# Patient Record
Sex: Female | Born: 1945
Health system: Southern US, Community
[De-identification: ages and names within clinical notes are randomized; demographics above are authoritative.]

## PROBLEM LIST (undated history)

## (undated) DIAGNOSIS — R300 Dysuria: Principal | ICD-10-CM

## (undated) DIAGNOSIS — Z809 Family history of malignant neoplasm, unspecified: Principal | ICD-10-CM

## (undated) DIAGNOSIS — N958 Other specified menopausal and perimenopausal disorders: Secondary | ICD-10-CM

## (undated) DIAGNOSIS — F419 Anxiety disorder, unspecified: Secondary | ICD-10-CM

## (undated) DIAGNOSIS — K219 Gastro-esophageal reflux disease without esophagitis: Secondary | ICD-10-CM

## (undated) DIAGNOSIS — R7989 Other specified abnormal findings of blood chemistry: Principal | ICD-10-CM

## (undated) DIAGNOSIS — Z1231 Encounter for screening mammogram for malignant neoplasm of breast: Secondary | ICD-10-CM

## (undated) DIAGNOSIS — N281 Cyst of kidney, acquired: Principal | ICD-10-CM

## (undated) DIAGNOSIS — J3800 Paralysis of vocal cords and larynx, unspecified: Secondary | ICD-10-CM

## (undated) DIAGNOSIS — Z800 Family history of malignant neoplasm of digestive organs: Principal | ICD-10-CM

## (undated) DIAGNOSIS — N898 Other specified noninflammatory disorders of vagina: Secondary | ICD-10-CM

## (undated) DIAGNOSIS — I2584 Coronary atherosclerosis due to calcified coronary lesion: Secondary | ICD-10-CM

## (undated) DIAGNOSIS — I2510 Atherosclerotic heart disease of native coronary artery without angina pectoris: Secondary | ICD-10-CM

## (undated) DIAGNOSIS — K760 Fatty (change of) liver, not elsewhere classified: Principal | ICD-10-CM

## (undated) DIAGNOSIS — M4807 Spinal stenosis, lumbosacral region: Secondary | ICD-10-CM

## (undated) DIAGNOSIS — R928 Other abnormal and inconclusive findings on diagnostic imaging of breast: Secondary | ICD-10-CM

## (undated) DIAGNOSIS — E782 Mixed hyperlipidemia: Secondary | ICD-10-CM

## (undated) DIAGNOSIS — Z8719 Personal history of other diseases of the digestive system: Secondary | ICD-10-CM

## (undated) DIAGNOSIS — F32A Depression, unspecified: Secondary | ICD-10-CM

## (undated) DIAGNOSIS — M8580 Other specified disorders of bone density and structure, unspecified site: Secondary | ICD-10-CM

## (undated) DIAGNOSIS — Z85828 Personal history of other malignant neoplasm of skin: Secondary | ICD-10-CM

## (undated) DIAGNOSIS — N1832 Chronic kidney disease, stage 3b: Secondary | ICD-10-CM

## (undated) DIAGNOSIS — K5790 Diverticulosis of intestine, part unspecified, without perforation or abscess without bleeding: Secondary | ICD-10-CM

## (undated) DIAGNOSIS — M1990 Unspecified osteoarthritis, unspecified site: Secondary | ICD-10-CM

## (undated) DIAGNOSIS — I10 Essential (primary) hypertension: Secondary | ICD-10-CM

## (undated) DIAGNOSIS — C801 Malignant (primary) neoplasm, unspecified: Secondary | ICD-10-CM

## (undated) DIAGNOSIS — F329 Major depressive disorder, single episode, unspecified: Secondary | ICD-10-CM

## (undated) DIAGNOSIS — E785 Hyperlipidemia, unspecified: Secondary | ICD-10-CM

## (undated) DIAGNOSIS — Z87442 Personal history of urinary calculi: Secondary | ICD-10-CM

## (undated) DIAGNOSIS — N189 Chronic kidney disease, unspecified: Secondary | ICD-10-CM

## (undated) DIAGNOSIS — N3281 Overactive bladder: Secondary | ICD-10-CM

## (undated) DIAGNOSIS — T7840XA Allergy, unspecified, initial encounter: Secondary | ICD-10-CM

## (undated) HISTORY — DX: Allergy, unspecified, initial encounter: T78.40XA

## (undated) HISTORY — DX: Chronic kidney disease, stage 3b: N18.32

## (undated) HISTORY — DX: Major depressive disorder, single episode, unspecified: F32.9

## (undated) HISTORY — DX: Chronic kidney disease, unspecified: N18.9

## (undated) HISTORY — DX: Depression, unspecified: F32.A

## (undated) HISTORY — PX: TUBAL LIGATION: SHX77

## (undated) HISTORY — DX: Other specified disorders of bone density and structure, unspecified site: M85.80

## (undated) HISTORY — DX: Gastro-esophageal reflux disease without esophagitis: K21.9

## (undated) HISTORY — DX: Essential (primary) hypertension: I10

## (undated) HISTORY — PX: TONSILLECTOMY: SUR1361

## (undated) HISTORY — DX: Hyperlipidemia, unspecified: E78.5

## (undated) HISTORY — DX: Overactive bladder: N32.81

---

## 1970-03-20 HISTORY — PX: BREAST BIOPSY: SHX20

## 1992-03-20 HISTORY — PX: ABDOMINAL HYSTERECTOMY: SHX81

## 1998-01-01 ENCOUNTER — Other Ambulatory Visit: Admission: RE | Admit: 1998-01-01 | Discharge: 1998-01-01 | Payer: Self-pay | Admitting: Obstetrics and Gynecology

## 1999-01-25 ENCOUNTER — Other Ambulatory Visit: Admission: RE | Admit: 1999-01-25 | Discharge: 1999-01-25 | Payer: Self-pay | Admitting: Obstetrics and Gynecology

## 2000-03-30 ENCOUNTER — Other Ambulatory Visit: Admission: RE | Admit: 2000-03-30 | Discharge: 2000-03-30 | Payer: Self-pay | Admitting: Obstetrics and Gynecology

## 2000-09-28 ENCOUNTER — Ambulatory Visit (HOSPITAL_COMMUNITY): Admission: RE | Admit: 2000-09-28 | Discharge: 2000-09-28 | Payer: Self-pay | Admitting: Emergency Medicine

## 2000-09-28 ENCOUNTER — Encounter: Payer: Self-pay | Admitting: Emergency Medicine

## 2001-01-22 ENCOUNTER — Encounter: Admission: RE | Admit: 2001-01-22 | Discharge: 2001-01-22 | Payer: Self-pay | Admitting: Emergency Medicine

## 2001-01-22 ENCOUNTER — Encounter: Payer: Self-pay | Admitting: Emergency Medicine

## 2001-04-08 ENCOUNTER — Other Ambulatory Visit: Admission: RE | Admit: 2001-04-08 | Discharge: 2001-04-08 | Payer: Self-pay | Admitting: Obstetrics and Gynecology

## 2002-05-27 ENCOUNTER — Encounter: Payer: Self-pay | Admitting: Emergency Medicine

## 2002-05-27 ENCOUNTER — Encounter: Admission: RE | Admit: 2002-05-27 | Discharge: 2002-05-27 | Payer: Self-pay | Admitting: Emergency Medicine

## 2002-11-10 ENCOUNTER — Other Ambulatory Visit: Admission: RE | Admit: 2002-11-10 | Discharge: 2002-11-10 | Payer: Self-pay | Admitting: Obstetrics and Gynecology

## 2003-02-19 ENCOUNTER — Encounter: Admission: RE | Admit: 2003-02-19 | Discharge: 2003-02-19 | Payer: Self-pay | Admitting: Emergency Medicine

## 2004-02-15 ENCOUNTER — Encounter: Admission: RE | Admit: 2004-02-15 | Discharge: 2004-02-15 | Payer: Self-pay | Admitting: Emergency Medicine

## 2004-02-16 ENCOUNTER — Other Ambulatory Visit: Admission: RE | Admit: 2004-02-16 | Discharge: 2004-02-16 | Payer: Self-pay | Admitting: Obstetrics and Gynecology

## 2004-02-22 ENCOUNTER — Encounter: Admission: RE | Admit: 2004-02-22 | Discharge: 2004-02-22 | Payer: Self-pay | Admitting: Emergency Medicine

## 2005-02-22 ENCOUNTER — Other Ambulatory Visit: Admission: RE | Admit: 2005-02-22 | Discharge: 2005-02-22 | Payer: Self-pay | Admitting: Obstetrics and Gynecology

## 2005-07-12 ENCOUNTER — Encounter: Admission: RE | Admit: 2005-07-12 | Discharge: 2005-07-12 | Payer: Self-pay | Admitting: Gynecology

## 2006-08-10 ENCOUNTER — Encounter: Admission: RE | Admit: 2006-08-10 | Discharge: 2006-08-10 | Payer: Self-pay | Admitting: Obstetrics and Gynecology

## 2006-08-29 ENCOUNTER — Other Ambulatory Visit: Admission: RE | Admit: 2006-08-29 | Discharge: 2006-08-29 | Payer: Self-pay | Admitting: Obstetrics and Gynecology

## 2007-08-30 ENCOUNTER — Other Ambulatory Visit: Admission: RE | Admit: 2007-08-30 | Discharge: 2007-08-30 | Payer: Self-pay | Admitting: Obstetrics and Gynecology

## 2008-02-12 ENCOUNTER — Encounter: Payer: Self-pay | Admitting: Family Medicine

## 2008-07-15 ENCOUNTER — Encounter: Payer: Self-pay | Admitting: Family Medicine

## 2008-09-15 ENCOUNTER — Encounter: Payer: Self-pay | Admitting: Obstetrics and Gynecology

## 2008-09-15 ENCOUNTER — Ambulatory Visit: Payer: Self-pay | Admitting: Obstetrics and Gynecology

## 2008-09-15 ENCOUNTER — Other Ambulatory Visit: Admission: RE | Admit: 2008-09-15 | Discharge: 2008-09-15 | Payer: Self-pay | Admitting: Obstetrics and Gynecology

## 2009-04-12 ENCOUNTER — Ambulatory Visit: Payer: Self-pay | Admitting: Family Medicine

## 2009-04-12 DIAGNOSIS — I10 Essential (primary) hypertension: Secondary | ICD-10-CM | POA: Insufficient documentation

## 2009-04-12 DIAGNOSIS — F3289 Other specified depressive episodes: Secondary | ICD-10-CM | POA: Insufficient documentation

## 2009-04-12 DIAGNOSIS — F329 Major depressive disorder, single episode, unspecified: Secondary | ICD-10-CM

## 2009-04-12 DIAGNOSIS — J309 Allergic rhinitis, unspecified: Secondary | ICD-10-CM | POA: Insufficient documentation

## 2009-04-12 DIAGNOSIS — J45909 Unspecified asthma, uncomplicated: Secondary | ICD-10-CM | POA: Insufficient documentation

## 2009-04-12 DIAGNOSIS — K219 Gastro-esophageal reflux disease without esophagitis: Secondary | ICD-10-CM | POA: Insufficient documentation

## 2009-04-12 DIAGNOSIS — E785 Hyperlipidemia, unspecified: Secondary | ICD-10-CM | POA: Insufficient documentation

## 2009-04-12 DIAGNOSIS — R32 Unspecified urinary incontinence: Secondary | ICD-10-CM | POA: Insufficient documentation

## 2009-04-28 ENCOUNTER — Ambulatory Visit: Payer: Self-pay | Admitting: Family Medicine

## 2009-05-01 ENCOUNTER — Encounter: Payer: Self-pay | Admitting: Family Medicine

## 2009-05-03 LAB — CONVERTED CEMR LAB
ALT: 27 units/L (ref 0–35)
AST: 26 units/L (ref 0–37)
Alkaline Phosphatase: 75 units/L (ref 39–117)
BUN: 14 mg/dL (ref 6–23)
Basophils Absolute: 0 10*3/uL (ref 0.0–0.1)
Bilirubin, Direct: 0 mg/dL (ref 0.0–0.3)
CO2: 31 meq/L (ref 19–32)
Eosinophils Relative: 1.8 % (ref 0.0–5.0)
Lymphs Abs: 0.9 10*3/uL (ref 0.7–4.0)
MCHC: 33.4 g/dL (ref 30.0–36.0)
Monocytes Absolute: 0.4 10*3/uL (ref 0.1–1.0)
Neutro Abs: 3.3 10*3/uL (ref 1.4–7.7)
Platelets: 148 10*3/uL — ABNORMAL LOW (ref 150.0–400.0)
Potassium: 4.5 meq/L (ref 3.5–5.1)
RDW: 12.9 % (ref 11.5–14.6)
TSH: 1.55 microintl units/mL (ref 0.35–5.50)
Total Bilirubin: 0.3 mg/dL (ref 0.3–1.2)
Total CHOL/HDL Ratio: 4
Triglycerides: 262 mg/dL — ABNORMAL HIGH (ref 0.0–149.0)
Vit D, 25-Hydroxy: 30 ng/mL (ref 30–89)
WBC: 4.7 10*3/uL (ref 4.5–10.5)

## 2009-05-05 ENCOUNTER — Emergency Department (HOSPITAL_COMMUNITY): Admission: EM | Admit: 2009-05-05 | Discharge: 2009-05-06 | Payer: Self-pay | Admitting: Emergency Medicine

## 2009-05-06 ENCOUNTER — Telehealth: Payer: Self-pay | Admitting: Family Medicine

## 2009-05-21 ENCOUNTER — Telehealth: Payer: Self-pay | Admitting: Family Medicine

## 2009-05-22 ENCOUNTER — Ambulatory Visit: Payer: Self-pay | Admitting: Family Medicine

## 2009-05-22 LAB — CONVERTED CEMR LAB
Glucose, Urine, Semiquant: NEGATIVE
Protein, U semiquant: NEGATIVE
Specific Gravity, Urine: 1.01
WBC Urine, dipstick: NEGATIVE

## 2009-06-14 ENCOUNTER — Encounter: Payer: Self-pay | Admitting: Family Medicine

## 2009-07-19 ENCOUNTER — Encounter: Payer: Self-pay | Admitting: Family Medicine

## 2009-09-10 ENCOUNTER — Telehealth: Payer: Self-pay | Admitting: Family Medicine

## 2009-09-13 ENCOUNTER — Ambulatory Visit: Payer: Self-pay | Admitting: Family Medicine

## 2009-09-14 ENCOUNTER — Encounter: Payer: Self-pay | Admitting: Family Medicine

## 2009-09-14 LAB — CONVERTED CEMR LAB: Triglycerides: 112 mg/dL (ref 0.0–149.0)

## 2010-03-25 ENCOUNTER — Ambulatory Visit
Admission: RE | Admit: 2010-03-25 | Discharge: 2010-03-25 | Payer: Self-pay | Source: Home / Self Care | Attending: Obstetrics and Gynecology | Admitting: Obstetrics and Gynecology

## 2010-03-25 ENCOUNTER — Other Ambulatory Visit
Admission: RE | Admit: 2010-03-25 | Discharge: 2010-03-25 | Payer: Self-pay | Source: Home / Self Care | Admitting: Obstetrics and Gynecology

## 2010-04-13 ENCOUNTER — Encounter
Admission: RE | Admit: 2010-04-13 | Discharge: 2010-04-13 | Payer: Self-pay | Source: Home / Self Care | Attending: Gastroenterology | Admitting: Gastroenterology

## 2010-04-19 NOTE — Letter (Signed)
Summary: Records from [facility] 2006 - [address]  Records from [facility] 2006 - [address]   Imported By: Maryln Gottron 04/23/2009 15:45:06  _____________________________________________________________________  External Attachment:    Type:   Image     Comment:   External Document

## 2010-04-19 NOTE — Procedures (Signed)
Summary: Colonoscopy Report/Dr. Sharrell Ku  Colonoscopy Report/Dr. Sharrell Ku   Imported By: Maryln Gottron 07/07/2009 15:44:54  _____________________________________________________________________  External Attachment:    Type:   Image     Comment:   External Document

## 2010-04-19 NOTE — Assessment & Plan Note (Signed)
Summary: ?UTI/NJR   Vital Signs:  Patient profile:   65 year old female Weight:      166 pounds Temp:     98.3 degrees F oral Pulse rate:   64 / minute Pulse rhythm:   regular BP sitting:   118 / 80  (left arm) Cuff size:   regular  Vitals Entered By: Lowella Petties CMA (May 22, 2009 9:40 AM) CC: Pt states she has had fever, pressure when voiding.   History of Present Illness: 65 year old with UTI symptoms:  Has had some reent bladder infections, had some symptoms in december. Had some blood in her urine then and was given three days of ABX then.   Symptoms came back about a month then. Dr. Clent Ridges saw her and had a urine sample and was cultured - and had some infection.   Macrobid - made sick on her stomach  Got a temp to 103. Went to the ER and got a virus on top of the infection per her report   Had some fever blisters.   Now, feels some urgency and dysuria. Thursday night started to feel pretty bad - got a temp of 102.8. No back pain   Allergies: 1)  ! Codeine 2)  ! Ace Inhibitors  Past History:  Past medical, surgical, family and social histories (including risk factors) reviewed, and no changes noted (except as noted below).  Past Medical History: Reviewed history from 04/12/2009 and no changes required. Allergic rhinitis Asthma Depression GERD Hyperlipidemia Hypertension Urinary incontinence normal stress test July 2010 vitamin B12 deficiency vitamin D deficiency  Past Surgical History: Reviewed history from 04/12/2009 and no changes required. Breast biopsy, benign 1972 TAH with intact ovaries sees Dr. Danne Baxter for GYN exams Tubal ligation Tonsillectomy colonoscopy 2005 per Dr. Kinnie Scales, clear, repeat in  10 yrs  Family History: Reviewed history from 04/12/2009 and no changes required. Family History of Alcoholism/Addiction Family History of Arthritis Family History Breast cancer 1st degree relative <50 Family History of CAD Female 1st degree  relative <50 Family History Depression Family History Diabetes 1st degree relative Family History High cholesterol Family History Hypertension Stenosis of spine - Mom  Social History: Reviewed history from 04/12/2009 and no changes required. 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 Alcohol use-yes Drug use-no Regular exercise-yes  Review of Systems       The patient complains of anorexia and fever.  The patient denies chest pain, syncope, prolonged cough, and headaches.         suprapubic pain  Physical Exam  General:  Well-developed,well-nourished,in no acute distress; alert,appropriate and cooperative throughout examination Head:  Normocephalic and atraumatic without obvious abnormalities. No apparent alopecia or balding. Ears:  External ear exam shows no significant lesions or deformities.  Otoscopic examination reveals clear canals, tympanic membranes are intact bilaterally without bulging, retraction, inflammation or discharge. Hearing is grossly normal bilaterally. Nose:  no external deformity.   Mouth:  Oral mucosa and oropharynx without lesions or exudates.  Teeth in good repair. Neck:  No deformities, masses, or tenderness noted. Lungs:  Normal respiratory effort, chest expands symmetrically. Lungs are clear to auscultation, no crackles or wheezes. Heart:  Normal rate and regular rhythm. S1 and S2 normal without gallop, murmur, click, rub or other extra sounds. Abdomen:  + suprapubic tendernesssoft, normal bowel sounds, no distention, no masses, no guarding, no rigidity, and no rebound tenderness.    no cvat Extremities:  No clubbing, cyanosis, edema, or deformity  noted with normal full range of motion of all joints.   Neurologic:  alert & oriented X3 and gait normal.   Cervical Nodes:  No lymphadenopathy noted Psych:  Cognition and judgment appear intact. Alert and cooperative with normal attention span and concentration. No apparent delusions,  illusions, hallucinations   Impression & Recommendations:  Problem # 1:  PYELONEPHRITIS, ACUTE (ICD-590.10) Assessment New  Temp approaching 103 with UTI symptoms, + UA (blood only), dysuria, suprapubic pain  Probable early pyelo and treat with longer course of ABX  stable for outpatient management  Orders: UA Dipstick w/o Micro (manual) (46270)  Complete Medication List: 1)  Nexium 40 Mg Cpdr (Esomeprazole magnesium) .... Take 1 tab each morning 2)  Detrol La 4 Mg Cp24 (Tolterodine tartrate) .[name].. 1 tablet by mouth daily 3)  Sertraline Hcl 100 Mg Tabs (Sertraline hcl) .[name].. 1 by mouth daily 4)  Budeprion Xl 300 Mg Tb24 (Bupropion hcl) .[name].. 1 by mouth daily 5)  Vitamin D (ergocalciferol) 50000 Unit Caps (Ergocalciferol) .... Once weekly 6)  Simvastatin 80 Mg Tabs (Simvastatin) .[name].. 1 by mouth at bedtime 7)  Aspirin 81 Mg Tbec (Aspirin) .... One by mouth every day 8)  Metoprolol Succinate 50 Mg Xr24h-tab (Metoprolol succinate) .... Once daily 9)  Fenofibrate 160 Mg Tabs (Fenofibrate) .[name].. 1 by mouth once daily 10)  Ciprofloxacin Hcl 250 Mg Tabs (Ciprofloxacin hcl) .[name].. 1 by mouth two times a day Prescriptions: CIPROFLOXACIN HCL 250 MG TABS (CIPROFLOXACIN HCL) 1 by mouth two times a day  #28 x 0   Entered and Authorized by:   Hannah Beat MD   Signed by:   Hannah Beat MD on 05/22/2009   Method used:   Electronically to        [id number] (retail)       [address]       [place], [place]  [address]       Ph: [phone] or [phone]       Fax: [phone]   RxID:   [id number]   Laboratory Results   Urine Tests  Date/Time Received: May 22, 2009 9:46 AM  Date/Time Reported: May 22, 2009 9:46 AM   Routine Urinalysis   Color: yellow Appearance: Clear Glucose: negative   (Normal Range: Negative) Bilirubin: negative   (Normal Range: Negative) Ketone: negative   (Normal Range: Negative) Spec. Gravity: 1.010   (Normal Range: 1.003-1.035) Blood:  small   (Normal Range: Negative) pH: 5.0   (Normal Range: 5.0-8.0) Protein: negative   (Normal Range: Negative) Urobilinogen: 0.2   (Normal Range: 0-1) Nitrite: negative   (Normal Range: Negative) Leukocyte Esterace: negative   (Normal Range: Negative)

## 2010-04-19 NOTE — Progress Notes (Signed)
Summary: fever, uti  Phone Note Call from Patient Call back at Home Phone [phone]   Caller: Patient Call For: fry Summary of Call: pt called at 4:30 saying she had a fever last night of 102.8 and now it is 100.4. She was treated for a UTI last month.  Gave the pt 2 choices, go to the Urgent Care to recheck a urine or [facility] could [facility].  [facility].  Scheduled appt fo r 9:20.  Told pt to drink plenty of fluids, take advil or tylelnol for the fever, if fever persist or she has n/v to go to ER.  Pt agreed Initial call taken by: Alfred Levins, CMA,  May 21, 2009 4:53 PM  Follow-up for Phone Call        agreed Follow-up by: Nelwyn Salisbury MD,  May 21, 2009 5:02 PM

## 2010-04-19 NOTE — Progress Notes (Signed)
Summary: Call A Nurse Report  Phone Note Call from Patient   Summary of Call: pt was seen in the ED last night Initial call taken by: Alfred Levins, CMA,  May 06, 2009 8:18 AM  Follow-up for Phone Call       Follow-up by: Lynann Beaver CMA,  May 06, 2009 8:14 AM    Additional Follow-up for Phone Call Additional follow up Details #2::    After reviewing On Call Nurse note from last night, left message on pt's personal voice mail to call for appt, or to update [place] on pt's condition this a. Follow-up by: Lynann Beaver CMA,  May 06, 2009 8:16 AM    Call-A-Nurse Triage Call Report Triage Record Num: [id number] Operator: Patriciaann Clan Patient Name: Maria Schneider Call Date & Time: 05/05/2009 5:20:07PM Patient Phone: [phone] PCP: Tera Mater. Clent Ridges Patient Gender: Female PCP Fax : [phone] Patient DOB: [birth year] [facility] Name: [facility] Reason for Call: Spouse/Thomas calling. States patient was seen in office 04/29/09 and dx withUTI. States pateint started on Nitrofurantin for UTI. States patient developed fever 05/05/09 0400. Temp.103.4 orally @ 1645 05/05/09.Patient had Ibuprofen 400mg . @ 1645 05/05/09. States patient c/o nausea. Emesis X 1. Patient states only urinated X 1 05/05/09 @ 1500. States urine dark in color. Denies hematuria. Advised increased fluids. Spouse advised to have patient evaluated in ED @ [facility]. Spouse agreeable. States will transport now. Protocol(s) Used: Urinary Symptoms - Female Recommended Outcome per Protocol: See Provider within 24 hours Override Outcome if Used in Protocol: See ED Immediately RN Reason for Override Outcome: Nursing Judgement Used. Reason for Outcome: Urinary tract symptoms Care Advice:  ~ Call provider if urine becomes pink, red, or smoky in color.  ~ Tell provider medical history of renal disease; especially if have only one kidney.  ~ Call provider if you develop flank or low back pain, fever,  generally feel sick. Increase intake of fluids. Try to drink 8 oz. (.2 liter) every hour when awake, including unsweetened cranberry juice, unless on restricted fluids for other medical reasons. Take sips of fluid or eat ice chips if nauseated or vomiting.  ~ Limit carbonated, alcoholic, and caffeinated beverages such as coffee, tea and soda. Avoid OTC cold and allergy medications that contain caffeine. Limit intake of tomatoes, fruit juices (except for unsweetened cranberry juice), dairy products, spicy foods, sugar, and artificial sweeteners (aspartame or saccharine). Stop or decrease smoking. Reducing exposure to bladder irritants may help lessen urgency.  ~ Systemic Inflammatory Response Syndrome (SIRS): Watch for signs of a generalized, whole body infection. Occurs within days of a localized infection, especially of the urinary, GI, respiratory or nervous systems; or after a traumatic injury or invasive procedure. - Call EMS 911 if symptoms have worsened, such as increasing confusion or unusual drowsiness; cold and clammy skin; no urine output; rapid respiration (>30/min.) or slow respiration (<10/min.); struggling to breathe. - Go to the ED immediately for early symptoms of rapid pulse >90/min. or rapid breathing >20/min. at rest; chills; oral temperature >100.4 F (38 C) or <96.8 F (36 C) when associated with conditions noted.  ~ Analgesic Advice: Consider aspirin, ibuprofen, naproxen or ketoprofen for pain or fever as directed on label or by pharmacist/provider. PRECAUTION: - If over 63 years of age, should not take longer than 1 week without consulting provider. EXCEPTIONS: - Should not be used if taking blood thinners. - Or if have history of sensitivity/allergy to any of these medications; or history of  ulcer or kidney disease.  ~  ~ Tell your provider if you are taking Warfarin (Coumadin) and drinking cranberry juice or taking cranberry capsules.  ~ SYMPTOM / CONDITION  MANAGEMENT 05/05/2009 5:35:12PM Page 1 of 1 [id number]  Appended Document: Call A Nurse Report Spoke to pt's husband, and they DID go to the ER last night, and she was given IV's and sent home.  Doing well right now.  Diagnoses with a virus, not flu, and not UTI.

## 2010-04-19 NOTE — Assessment & Plan Note (Signed)
Summary: 2 wk rov/mm   Vital Signs:  Patient profile:   65 year old female Weight:      167 pounds O2 Sat:      97 % Temp:     98.1 degrees F oral Pulse rate:   72 / minute BP sitting:   110 / 80  Vitals Entered By: Lynann Beaver CMA (April 28, 2009 8:32 AM) CC: BP check and fasting labs Is Patient Diabetic? No Pain Assessment Patient in pain? no        History of Present Illness: Here to follow up HTN after switching to metoprolol. She tolerates this well, and her BP is excellent. She is fasting for labs. Also she asks about some mild intermittent anterior chest discomfort that has bothered her for about 5 years. No SOB, no heartburn. No relationship to exercise.   Current Medications (verified): 1)  Nexium 40 Mg Cpdr (Esomeprazole Magnesium) .... Take 1 Tab Each Morning 2)  Detrol La 4 Mg Cp24 (Tolterodine Tartrate) .[name].. 1 Tablet By Mouth Daily 3)  Sertraline Hcl 100 Mg Tabs (Sertraline Hcl) .[name].. 1 By Mouth Daily 4)  Budeprion Xl 300 Mg Tb24 (Bupropion Hcl) .[name].. 1 By Mouth Daily 5)  Vitamin D (Ergocalciferol) 50000 Unit Caps (Ergocalciferol) .... Once Weekly 6)  Simvastatin 40 Mg Tabs (Simvastatin) .... Take One Tablet At Bedtime 7)  Aspirin 81 Mg  Tbec (Aspirin) .... One By Mouth Every Day 8)  Metoprolol Succinate 50 Mg Xr24h-Tab (Metoprolol Succinate) .... Once Daily  Allergies (verified): 1)  ! Codeine 2)  ! Ace Inhibitors  Past History:  Past Medical History: Reviewed history from 04/12/2009 and no changes required. Allergic rhinitis Asthma Depression GERD Hyperlipidemia Hypertension Urinary incontinence normal stress test July 2010 vitamin B12 deficiency vitamin D deficiency  Review of Systems  The patient denies anorexia, fever, weight loss, weight gain, vision loss, decreased hearing, hoarseness, syncope, dyspnea on exertion, peripheral edema, prolonged cough, headaches, hemoptysis, abdominal pain, melena, hematochezia, severe indigestion/heartburn,  hematuria, incontinence, genital sores, muscle weakness, suspicious skin lesions, transient blindness, difficulty walking, depression, unusual weight change, abnormal bleeding, enlarged lymph nodes, angioedema, breast masses, and testicular masses.    Physical Exam  General:  Well-developed,well-nourished,in no acute distress; alert,appropriate and cooperative throughout examination Neck:  No deformities, masses, or tenderness noted. Chest Wall:  mildly tender along the sternum Lungs:  Normal respiratory effort, chest expands symmetrically. Lungs are clear to auscultation, no crackles or wheezes. Heart:  Normal rate and regular rhythm. S1 and S2 normal without gallop, murmur, click, rub or other extra sounds.   Impression & Recommendations:  Problem # 1:  HYPERTENSION (ICD-401.9)  Her updated medication list for this problem includes:    Metoprolol Succinate 50 Mg Xr24h-tab (Metoprolol succinate) ..... Once daily  Orders: UA Dipstick w/o Micro (automated)  (81003) Venipuncture ([address]) TLB-Lipid Panel (80061-LIPID) TLB-BMP (Basic Metabolic Panel-BMET) (80048-METABOL) TLB-CBC Platelet - w/Differential (85025-CBCD) TLB-Hepatic/Liver Function Pnl (80076-HEPATIC) TLB-TSH (Thyroid Stimulating Hormone) (84443-TSH) TLB-B12, Serum-Total ONLY (60454-U98) T-Vitamin D (25-Hydroxy) (11914-78295)  Problem # 2:  HYPERLIPIDEMIA (ICD-272.4)  Her updated medication list for this problem includes:    Simvastatin 40 Mg Tabs (Simvastatin) .[name]... Take one tablet at bedtime  Problem # 3:  COSTOCHONDRITIS (ICD-733.6)  Her updated medication list for this problem includes:    Vitamin D (ergocalciferol) 50000 Unit Caps (Ergocalciferol) ..... Once weekly  Complete Medication List: 1)  Nexium 40 Mg Cpdr (Esomeprazole magnesium) .... Take 1 tab each morning 2)  Detrol La 4 Mg Cp24 (Tolterodine  tartrate) .[name].. 1 tablet by mouth daily 3)  Sertraline Hcl 100 Mg Tabs (Sertraline hcl) .[name].. 1 by mouth daily 4)   Budeprion Xl 300 Mg Tb24 (Bupropion hcl) .[name].. 1 by mouth daily 5)  Vitamin D (ergocalciferol) 50000 Unit Caps (Ergocalciferol) .... Once weekly 6)  Simvastatin 40 Mg Tabs (Simvastatin) .... Take one tablet at bedtime 7)  Aspirin 81 Mg Tbec (Aspirin) .... One by mouth every day 8)  Metoprolol Succinate 50 Mg Xr24h-tab (Metoprolol succinate) .... Once daily  Patient Instructions: 1)  Get labs. Reassured that the chest wall discomfort is benign. She says she will try wearing more supportive bras.   Appended Document: 2 wk rov/mm  Laboratory Results   Urine Tests    Routine Urinalysis   Color: yellow Appearance: Clear Glucose: negative   (Normal Range: Negative) Bilirubin: negative   (Normal Range: Negative) Ketone: negative   (Normal Range: Negative) Spec. Gravity: 1.020   (Normal Range: 1.003-1.035) Blood: 1+   (Normal Range: Negative) pH: 5.5   (Normal Range: 5.0-8.0) Protein: negative   (Normal Range: Negative) Urobilinogen: 0.2   (Normal Range: 0-1) Nitrite: negative   (Normal Range: Negative) Leukocyte Esterace: trace   (Normal Range: Negative)    Comments: Rita Ohara  April 28, 2009 11:46 AM

## 2010-04-19 NOTE — Assessment & Plan Note (Signed)
Summary: BRAND NEW PT/TO ESTABLISH/CJR   Vital Signs:  Patient profile:   65 year old female Height:      64.25 inches Weight:      168 pounds BMI:     28.72 Temp:     97.9 degrees F oral Pulse rate:   90 / minute BP sitting:   142 / 88  (left arm) Cuff size:   regular  Vitals Entered By: Alfred Levins, CMA (April 12, 2009 1:32 PM) CC: establish   History of Present Illness: 65 yr old female to establish with [place] and with some questions. She had been seeing Dr. Leslee Home until now. She has been treated for HTN with Losartan for about a year, but her BP is often a bit too high and this worries her. She would like some labwork as well to check Vitamins b12 and D, as these have been low in the past, and she is past due to check her cholesterol. She is not fasting today however. She feels fine in general.   Preventive Screening-Counseling & Management  Alcohol-Tobacco     Smoking Status: quit  Caffeine-Diet-Exercise     Does Patient Exercise: yes      Drug Use:  no.    Current Medications (verified): 1)  Nexium 40 Mg Cpdr (Esomeprazole Magnesium) .... Take 1 Tab Each Morning 2)  Detrol La 4 Mg Cp24 (Tolterodine Tartrate) .[name].. 1 Tablet By Mouth Daily 3)  Sertraline Hcl 100 Mg Tabs (Sertraline Hcl) .[name].. 1 By Mouth Daily 4)  Budeprion Xl 300 Mg Tb24 (Bupropion Hcl) .[name].. 1 By Mouth Daily 5)  Vitamin D (Ergocalciferol) 50000 Unit Caps (Ergocalciferol) .... Once Weekly 6)  Losartan Potassium 100 Mg Tabs (Losartan Potassium) .... Once Daily 7)  Simvastatin 40 Mg Tabs (Simvastatin) .... Take One Tablet At Bedtime 8)  Aspirin 81 Mg  Tbec (Aspirin) .... One By Mouth Every Day  Allergies (verified): 1)  ! Codeine 2)  ! Ace Inhibitors  Past History:  Past Medical History: Allergic rhinitis Asthma Depression GERD Hyperlipidemia Hypertension Urinary incontinence normal stress test July 2010 vitamin B12 deficiency vitamin D deficiency  Past Surgical History: Breast biopsy,  benign 1972 TAH with intact ovaries sees Dr. Danne Baxter for GYN exams Tubal ligation Tonsillectomy colonoscopy 2005 per Dr. Kinnie Scales, clear, repeat in  10 yrs  Family History: Reviewed history and no changes required. Family History of Alcoholism/Addiction Family History of Arthritis Family History Breast cancer 1st degree relative <50 Family History of CAD Female 1st degree relative <50 Family History Depression Family History Diabetes 1st degree relative Family History High cholesterol Family History Hypertension Stenosis of spine - Mom  Social History: Reviewed history and no changes required. 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 Alcohol use-yes Drug use-no Regular exercise-yes Smoking Status:  quit Drug Use:  no Does Patient Exercise:  yes  Review of Systems  The patient denies anorexia, fever, weight loss, weight gain, vision loss, decreased hearing, hoarseness, chest pain, syncope, dyspnea on exertion, peripheral edema, prolonged cough, headaches, hemoptysis, abdominal pain, melena, hematochezia, severe indigestion/heartburn, hematuria, incontinence, genital sores, muscle weakness, suspicious skin lesions, transient blindness, difficulty walking, depression, unusual weight change, abnormal bleeding, enlarged lymph nodes, angioedema, breast masses, and testicular masses.    Physical Exam  General:  Well-developed,well-nourished,in no acute distress; alert,appropriate and cooperative throughout examination Neck:  No deformities, masses, or tenderness noted. Lungs:  Normal respiratory effort, chest expands symmetrically. Lungs are clear to auscultation, no crackles or wheezes. Heart:  Normal rate and regular rhythm. S1 and S2 normal without gallop, murmur, click, rub or other extra sounds.   Impression & Recommendations:  Problem # 1:  HYPERTENSION (ICD-401.9)  The following medications were removed from the medication list:    Losartan  Potassium 100 Mg Tabs (Losartan potassium) ..... Once daily Her updated medication list for this problem includes:    Metoprolol Succinate 50 Mg Xr24h-tab (Metoprolol succinate) ..... Once daily  Problem # 2:  URINARY INCONTINENCE (ICD-788.30)  Problem # 3:  HYPERLIPIDEMIA (ICD-272.4)  Her updated medication list for this problem includes:    Simvastatin 40 Mg Tabs (Simvastatin) .[name]... Take one tablet at bedtime  Problem # 4:  GERD (ICD-530.81)  Her updated medication list for this problem includes:    Nexium 40 Mg Cpdr (Esomeprazole magnesium) .[name]... Take 1 tab each morning  Problem # 5:  DEPRESSION (ICD-311)  Her updated medication list for this problem includes:    Sertraline Hcl 100 Mg Tabs (Sertraline hcl) .[name]... 1 by mouth daily    Budeprion Xl 300 Mg Tb24 (Bupropion hcl) .[name]... 1 by mouth daily  Problem # 6:  ASTHMA (ICD-493.90)  Complete Medication List: 1)  Nexium 40 Mg Cpdr (Esomeprazole magnesium) .... Take 1 tab each morning 2)  Detrol La 4 Mg Cp24 (Tolterodine tartrate) .[name].. 1 tablet by mouth daily 3)  Sertraline Hcl 100 Mg Tabs (Sertraline hcl) .[name].. 1 by mouth daily 4)  Budeprion Xl 300 Mg Tb24 (Bupropion hcl) .[name].. 1 by mouth daily 5)  Vitamin D (ergocalciferol) 50000 Unit Caps (Ergocalciferol) .... Once weekly 6)  Simvastatin 40 Mg Tabs (Simvastatin) .... Take one tablet at bedtime 7)  Aspirin 81 Mg Tbec (Aspirin) .... One by mouth every day 8)  Metoprolol Succinate 50 Mg Xr24h-tab (Metoprolol succinate) .... Once daily  Patient Instructions: 1)  Switch from Losartan to Metoprolol succinate.  2)  Please schedule a follow-up appointment in 2 weeks.  3)  Plan to get fasting labs then Prescriptions: METOPROLOL SUCCINATE 50 MG XR24H-TAB (METOPROLOL SUCCINATE) once daily  #30 x 2   Entered and Authorized by:   Nelwyn Salisbury MD   Signed by:   Nelwyn Salisbury MD on 04/12/2009   Method used:   Electronically to        [facility] ([facility]       [address]       [place], [place]  [address]       Ph: [phone] or [phone]       Fax: [phone]   RxID:   [id number]

## 2010-04-19 NOTE — Letter (Signed)
Summary: Records from Dr. Lorenz Coaster 2006 - 2009  Records from Dr. Lorenz Coaster 2006 - 2009   Imported By: Maryln Gottron 04/20/2009 16:00:10  _____________________________________________________________________  External Attachment:    Type:   Image     Comment:   External Document

## 2010-04-19 NOTE — Letter (Signed)
Summary: [facility]  [facility]   Imported By: Maryln Gottron 08/04/2009 14:00:50  _____________________________________________________________________  External Attachment:    Type:   Image     Comment:   External Document

## 2010-04-19 NOTE — Progress Notes (Signed)
Summary: ? about Simvastatin  Phone Note Call from Patient Call back at Home Phone [phone]   Caller: Patient--live call Summary of Call: Received a letter in the mail about her Simvastatin 80mg . Should she discontinue taking the medication. Please advise. need to know today. Initial call taken by: Warnell Forester,  September 10, 2009 4:23 PM  Follow-up for Phone Call        she is correct. There have been warnings about this dose, so stop taking the Zocor immediately. She should be alright, but have her see me next week fasting for labs and to discuss what our next step should be.  Follow-up by: Nelwyn Salisbury MD,  September 10, 2009 4:50 PM  Additional Follow-up for Phone Call Additional follow up Details #1::        Phone Call Completed Additional Follow-up by: Raechel Ache, RN,  September 10, 2009 4:53 PM

## 2010-04-19 NOTE — Miscellaneous (Signed)
  Clinical Lists Changes  Medications: Removed medication of SIMVASTATIN 80 MG TABS (SIMVASTATIN) 1 by mouth at bedtime Added new medication of ZETIA 10 MG TABS (EZETIMIBE) 1 once daily - Signed Rx of ZETIA 10 MG TABS (EZETIMIBE) 1 once daily;  #30 x 11;  Signed;  Entered by: Raechel Ache, RN;  Authorized by: Nelwyn Salisbury MD;  Method used: Electronically to [id number] [address], [place], [place]  [address], Ph: [phone] or [phone], Fax: [phone]    Prescriptions: ZETIA 10 MG TABS (EZETIMIBE) 1 once daily  #30 x 11   Entered by:   Raechel Ache, RN   Authorized by:   Nelwyn Salisbury MD   Signed by:   Raechel Ache, RN on 09/14/2009   Method used:   Electronically to        [id number] (retail)       [address]       [place], [place]  [address]       Ph: [phone] or [phone]       Fax: [phone]   RxID:   [id number]

## 2010-05-11 ENCOUNTER — Encounter (INDEPENDENT_AMBULATORY_CARE_PROVIDER_SITE_OTHER): Payer: BC Managed Care – PPO

## 2010-05-11 DIAGNOSIS — M899 Disorder of bone, unspecified: Secondary | ICD-10-CM

## 2010-05-11 DIAGNOSIS — M949 Disorder of cartilage, unspecified: Secondary | ICD-10-CM

## 2010-05-30 ENCOUNTER — Other Ambulatory Visit: Payer: Self-pay | Admitting: Family Medicine

## 2010-06-08 LAB — URINALYSIS, ROUTINE W REFLEX MICROSCOPIC
Nitrite: NEGATIVE
Urobilinogen, UA: 1 mg/dL (ref 0.0–1.0)

## 2010-06-08 LAB — CBC
HCT: 35.3 % — ABNORMAL LOW (ref 36.0–46.0)
MCHC: 34.6 g/dL (ref 30.0–36.0)
MCV: 87.8 fL (ref 78.0–100.0)
Platelets: 106 10*3/uL — ABNORMAL LOW (ref 150–400)
RBC: 4.02 MIL/uL (ref 3.87–5.11)
RDW: 13.3 % (ref 11.5–15.5)
WBC: 7.2 10*3/uL (ref 4.0–10.5)

## 2010-06-08 LAB — DIFFERENTIAL
Lymphs Abs: 0.2 10*3/uL — ABNORMAL LOW (ref 0.7–4.0)
Monocytes Absolute: 0.5 10*3/uL (ref 0.1–1.0)
Monocytes Relative: 6 % (ref 3–12)
Neutro Abs: 6.5 10*3/uL (ref 1.7–7.7)

## 2010-06-08 LAB — BASIC METABOLIC PANEL
Calcium: 8.7 mg/dL (ref 8.4–10.5)
GFR calc Af Amer: >60 mL/min (ref >60)
Glucose, Bld: 121 mg/dL — ABNORMAL HIGH (ref 70–99)

## 2010-06-08 LAB — URINE MICROSCOPIC-ADD ON

## 2010-06-15 ENCOUNTER — Encounter: Payer: Self-pay | Admitting: Family Medicine

## 2010-09-24 ENCOUNTER — Other Ambulatory Visit: Payer: Self-pay | Admitting: Family Medicine

## 2010-10-21 ENCOUNTER — Other Ambulatory Visit: Payer: Self-pay | Admitting: Family Medicine

## 2010-11-02 ENCOUNTER — Telehealth: Payer: Self-pay | Admitting: Family Medicine

## 2010-11-02 NOTE — Telephone Encounter (Signed)
I spoke with pt and script was approved for 1 month. She is due for a CPE and labs, also come fasting. Pt will call office back and schedule this.

## 2010-11-08 ENCOUNTER — Other Ambulatory Visit: Payer: Self-pay | Admitting: Family Medicine

## 2010-11-14 ENCOUNTER — Ambulatory Visit (INDEPENDENT_AMBULATORY_CARE_PROVIDER_SITE_OTHER): Payer: BC Managed Care – PPO | Admitting: Family Medicine

## 2010-11-14 ENCOUNTER — Encounter: Payer: Self-pay | Admitting: Family Medicine

## 2010-11-14 VITALS — BP 114/80 | HR 67 | Temp 98.1°F | Wt 171.0 lb

## 2010-11-14 DIAGNOSIS — E538 Deficiency of other specified B group vitamins: Secondary | ICD-10-CM

## 2010-11-14 DIAGNOSIS — I10 Essential (primary) hypertension: Secondary | ICD-10-CM

## 2010-11-14 DIAGNOSIS — Z23 Encounter for immunization: Secondary | ICD-10-CM

## 2010-11-14 DIAGNOSIS — Z0000 Encounter for general adult medical examination without abnormal findings: Secondary | ICD-10-CM

## 2010-11-14 DIAGNOSIS — H9190 Unspecified hearing loss, unspecified ear: Secondary | ICD-10-CM

## 2010-11-14 DIAGNOSIS — E785 Hyperlipidemia, unspecified: Secondary | ICD-10-CM

## 2010-11-14 LAB — CBC WITH DIFFERENTIAL/PLATELET
Basophils Absolute: 0 10*3/uL (ref 0.0–0.1)
Basophils Relative: 0.5 % (ref 0.0–3.0)
Eosinophils Relative: 1.8 % (ref 0.0–5.0)
MCV: 87.7 fl (ref 78.0–100.0)
Neutro Abs: 2.7 10*3/uL (ref 1.4–7.7)
Neutrophils Relative %: 67.4 % (ref 43.0–77.0)
RDW: 13.1 % (ref 11.5–14.6)
WBC: 4 10*3/uL — ABNORMAL LOW (ref 4.5–10.5)

## 2010-11-14 LAB — HEPATIC FUNCTION PANEL
ALT: 18 U/L (ref 0–35)
AST: 24 U/L (ref 0–37)
Albumin: 4.4 g/dL (ref 3.5–5.2)
Bilirubin, Direct: 0 mg/dL (ref 0.0–0.3)
Total Bilirubin: 0.3 mg/dL (ref 0.3–1.2)
Total Protein: 7.1 g/dL (ref 6.0–8.3)

## 2010-11-14 LAB — POCT URINALYSIS DIPSTICK
Protein, UA: NEGATIVE
Urobilinogen, UA: 1
pH, UA: 6.5

## 2010-11-14 LAB — LIPID PANEL
Cholesterol: 217 mg/dL — ABNORMAL HIGH (ref 0–200)
Triglycerides: 127 mg/dL (ref 0.0–149.0)
VLDL: 25.4 mg/dL (ref 0.0–40.0)

## 2010-11-14 LAB — VITAMIN B12: Vitamin B-12: 157 pg/mL — ABNORMAL LOW (ref 211–911)

## 2010-11-14 LAB — BASIC METABOLIC PANEL
CO2: 27 mEq/L (ref 19–32)
Creatinine, Ser: 1.1 mg/dL (ref 0.4–1.2)
Glucose, Bld: 102 mg/dL — ABNORMAL HIGH (ref 70–99)
Sodium: 142 mEq/L (ref 135–145)

## 2010-11-14 LAB — TSH: TSH: 1.71 u[IU]/mL (ref 0.35–5.50)

## 2010-11-14 LAB — LDL CHOLESTEROL, DIRECT: Direct LDL: 122.8 mg/dL

## 2010-11-14 NOTE — Progress Notes (Signed)
  Subjective:    Patient ID: Maria Schneider, female    DOB: 06-24-1945, 65 y.o.   MRN: [id number]  HPI Here for follow up. Her BP is stable. She feels tired all the time however. She sleeps well, and her depression is controlled. She also mentions trouble hearing. Voices seem muffled, and she has to turn the TV up quite a bit.    Review of Systems  Constitutional: Positive for fatigue.  HENT: Positive for hearing loss.   Respiratory: Negative.   Cardiovascular: Negative.   Psychiatric/Behavioral: Negative.        Objective:   Physical Exam  Constitutional: She appears well-developed and well-nourished.  HENT:  Right Ear: External ear normal.  Left Ear: External ear normal.  Cardiovascular: Normal rate, regular rhythm, normal heart sounds and intact distal pulses.   Pulmonary/Chest: Effort normal and breath sounds normal.          Assessment & Plan:  Get fasting labs today, including a B12 level. She will see an Audiologist her a hearing evaluation.

## 2010-11-16 ENCOUNTER — Telehealth: Payer: Self-pay | Admitting: Family Medicine

## 2010-11-16 NOTE — Telephone Encounter (Signed)
Message copied by Baldemar Friday on Wed Nov 16, 2010 10:24 AM ------      Message from: Maria Schneider      Created: Wed Nov 16, 2010  8:40 AM       Normal except her B12 is low. This is probably why she is tired, as we discussed. Start on B12 shots once Schneider week for 12 weeks, then recheck Schneider level

## 2010-11-16 NOTE — Telephone Encounter (Signed)
Spoke with pt and she will call back to schedule the injections. I put future lab order in computer.

## 2010-11-16 NOTE — Progress Notes (Signed)
Addended by: Aniceto Boss A on: 11/16/2010 10:24 AM   Modules accepted: Orders

## 2010-11-22 ENCOUNTER — Ambulatory Visit (INDEPENDENT_AMBULATORY_CARE_PROVIDER_SITE_OTHER): Payer: BC Managed Care – PPO | Admitting: Family Medicine

## 2010-11-22 DIAGNOSIS — E538 Deficiency of other specified B group vitamins: Secondary | ICD-10-CM

## 2010-11-22 MED ORDER — CYANOCOBALAMIN 1000 MCG/ML IJ SOLN
1000.0000 ug | Freq: Once | INTRAMUSCULAR | Status: AC
  Administered 2010-11-22: 1000 ug via INTRAMUSCULAR

## 2010-11-29 ENCOUNTER — Ambulatory Visit (INDEPENDENT_AMBULATORY_CARE_PROVIDER_SITE_OTHER): Payer: BC Managed Care – PPO | Admitting: Family Medicine

## 2010-11-29 DIAGNOSIS — E538 Deficiency of other specified B group vitamins: Secondary | ICD-10-CM

## 2010-11-29 MED ORDER — CYANOCOBALAMIN 1000 MCG/ML IJ SOLN
1000.0000 ug | Freq: Once | INTRAMUSCULAR | Status: AC
  Administered 2010-11-29: 1000 ug via INTRAMUSCULAR

## 2010-11-30 ENCOUNTER — Other Ambulatory Visit: Payer: Self-pay | Admitting: Family Medicine

## 2010-12-01 NOTE — Telephone Encounter (Signed)
Script sent e-scribe 

## 2010-12-06 ENCOUNTER — Ambulatory Visit (INDEPENDENT_AMBULATORY_CARE_PROVIDER_SITE_OTHER): Payer: BC Managed Care – PPO | Admitting: Family Medicine

## 2010-12-06 DIAGNOSIS — E539 Vitamin B deficiency, unspecified: Secondary | ICD-10-CM

## 2010-12-06 MED ORDER — CYANOCOBALAMIN 1000 MCG/ML IJ SOLN
1000.0000 ug | Freq: Once | INTRAMUSCULAR | Status: AC
  Administered 2010-12-06: 1000 ug via INTRAMUSCULAR

## 2010-12-12 ENCOUNTER — Other Ambulatory Visit: Payer: Self-pay | Admitting: Family Medicine

## 2010-12-12 ENCOUNTER — Encounter: Payer: Self-pay | Admitting: Family Medicine

## 2010-12-13 ENCOUNTER — Encounter: Payer: Self-pay | Admitting: Family Medicine

## 2010-12-13 ENCOUNTER — Ambulatory Visit (INDEPENDENT_AMBULATORY_CARE_PROVIDER_SITE_OTHER): Payer: BC Managed Care – PPO | Admitting: Family Medicine

## 2010-12-13 DIAGNOSIS — E539 Vitamin B deficiency, unspecified: Secondary | ICD-10-CM

## 2010-12-13 MED ORDER — CYANOCOBALAMIN 1000 MCG/ML IJ SOLN
1000.0000 ug | Freq: Once | INTRAMUSCULAR | Status: AC
  Administered 2010-12-13: 1000 ug via INTRAMUSCULAR

## 2010-12-20 ENCOUNTER — Ambulatory Visit (INDEPENDENT_AMBULATORY_CARE_PROVIDER_SITE_OTHER): Payer: BC Managed Care – PPO | Admitting: Family Medicine

## 2010-12-20 DIAGNOSIS — E539 Vitamin B deficiency, unspecified: Secondary | ICD-10-CM

## 2010-12-20 MED ORDER — CYANOCOBALAMIN 1000 MCG/ML IJ SOLN
1000.0000 ug | Freq: Once | INTRAMUSCULAR | Status: AC
  Administered 2010-12-20: 1000 ug via INTRAMUSCULAR

## 2010-12-27 ENCOUNTER — Ambulatory Visit: Payer: BC Managed Care – PPO | Admitting: Family Medicine

## 2010-12-29 ENCOUNTER — Other Ambulatory Visit: Payer: Self-pay | Admitting: Family Medicine

## 2011-01-03 ENCOUNTER — Ambulatory Visit: Payer: BC Managed Care – PPO | Admitting: Family Medicine

## 2011-01-04 ENCOUNTER — Telehealth: Payer: Self-pay | Admitting: Family Medicine

## 2011-01-04 NOTE — Telephone Encounter (Signed)
Pt called and was sch to get b-12 inj every tues since 11/22/10 for the next 12 wks, then recheck lvs. Pt has missed the last 2 b-12 inj, and is sch to come in again on 10/23. Pt wants to know if she should come in as schd or do we need to re-adjust inj sch to accommodate the missed injs?

## 2011-01-05 NOTE — Telephone Encounter (Signed)
Just come in as scheduled

## 2011-01-05 NOTE — Telephone Encounter (Signed)
Lft vm for pt re: b-12 injs and told her that Dr Clent Ridges recommended that pt come in as schd.

## 2011-01-10 ENCOUNTER — Ambulatory Visit (INDEPENDENT_AMBULATORY_CARE_PROVIDER_SITE_OTHER): Payer: BC Managed Care – PPO | Admitting: Family Medicine

## 2011-01-10 DIAGNOSIS — Z23 Encounter for immunization: Secondary | ICD-10-CM

## 2011-01-10 DIAGNOSIS — E539 Vitamin B deficiency, unspecified: Secondary | ICD-10-CM

## 2011-01-10 MED ORDER — CYANOCOBALAMIN 1000 MCG/ML IJ SOLN
1000.0000 ug | Freq: Once | INTRAMUSCULAR | Status: AC
  Administered 2011-01-10: 1000 ug via INTRAMUSCULAR

## 2011-01-17 ENCOUNTER — Ambulatory Visit (INDEPENDENT_AMBULATORY_CARE_PROVIDER_SITE_OTHER): Payer: BC Managed Care – PPO | Admitting: Family Medicine

## 2011-01-17 DIAGNOSIS — E539 Vitamin B deficiency, unspecified: Secondary | ICD-10-CM

## 2011-01-17 MED ORDER — CYANOCOBALAMIN 1000 MCG/ML IJ SOLN
1000.0000 ug | Freq: Once | INTRAMUSCULAR | Status: AC
  Administered 2011-01-17: 1000 ug via INTRAMUSCULAR

## 2011-01-24 ENCOUNTER — Ambulatory Visit (INDEPENDENT_AMBULATORY_CARE_PROVIDER_SITE_OTHER): Payer: BC Managed Care – PPO | Admitting: Family Medicine

## 2011-01-24 DIAGNOSIS — E538 Deficiency of other specified B group vitamins: Secondary | ICD-10-CM

## 2011-01-24 MED ORDER — CYANOCOBALAMIN 1000 MCG/ML IJ SOLN
1000.0000 ug | Freq: Once | INTRAMUSCULAR | Status: AC
  Administered 2011-01-24: 1000 ug via INTRAMUSCULAR

## 2011-01-31 ENCOUNTER — Ambulatory Visit: Payer: BC Managed Care – PPO | Admitting: Family Medicine

## 2011-01-31 ENCOUNTER — Other Ambulatory Visit: Payer: Self-pay | Admitting: Family Medicine

## 2011-02-07 ENCOUNTER — Ambulatory Visit (INDEPENDENT_AMBULATORY_CARE_PROVIDER_SITE_OTHER): Payer: BC Managed Care – PPO | Admitting: Family Medicine

## 2011-02-07 DIAGNOSIS — E539 Vitamin B deficiency, unspecified: Secondary | ICD-10-CM

## 2011-02-07 MED ORDER — CYANOCOBALAMIN 1000 MCG/ML IJ SOLN
1000.0000 ug | Freq: Once | INTRAMUSCULAR | Status: AC
  Administered 2011-02-07: 1000 ug via INTRAMUSCULAR

## 2011-02-08 ENCOUNTER — Other Ambulatory Visit: Payer: Self-pay | Admitting: Family Medicine

## 2011-02-14 ENCOUNTER — Ambulatory Visit (INDEPENDENT_AMBULATORY_CARE_PROVIDER_SITE_OTHER): Payer: BC Managed Care – PPO | Admitting: Family Medicine

## 2011-02-14 DIAGNOSIS — E539 Vitamin B deficiency, unspecified: Secondary | ICD-10-CM

## 2011-02-14 MED ORDER — CYANOCOBALAMIN 1000 MCG/ML IJ SOLN
1000.0000 ug | Freq: Once | INTRAMUSCULAR | Status: AC
  Administered 2011-02-14: 1000 ug via INTRAMUSCULAR

## 2011-02-21 ENCOUNTER — Ambulatory Visit: Payer: BC Managed Care – PPO | Admitting: Family Medicine

## 2011-02-28 ENCOUNTER — Ambulatory Visit (INDEPENDENT_AMBULATORY_CARE_PROVIDER_SITE_OTHER): Payer: BC Managed Care – PPO | Admitting: Family Medicine

## 2011-02-28 DIAGNOSIS — E539 Vitamin B deficiency, unspecified: Secondary | ICD-10-CM

## 2011-02-28 MED ORDER — CYANOCOBALAMIN 1000 MCG/ML IJ SOLN
1000.0000 ug | Freq: Once | INTRAMUSCULAR | Status: AC
  Administered 2011-02-28: 1000 ug via INTRAMUSCULAR

## 2011-03-03 ENCOUNTER — Other Ambulatory Visit: Payer: Self-pay | Admitting: Family Medicine

## 2011-03-13 ENCOUNTER — Telehealth: Payer: Self-pay | Admitting: Family Medicine

## 2011-03-13 ENCOUNTER — Ambulatory Visit (INDEPENDENT_AMBULATORY_CARE_PROVIDER_SITE_OTHER): Payer: BC Managed Care – PPO | Admitting: Family Medicine

## 2011-03-13 DIAGNOSIS — E539 Vitamin B deficiency, unspecified: Secondary | ICD-10-CM

## 2011-03-13 MED ORDER — CYANOCOBALAMIN 1000 MCG/ML IJ SOLN
1000.0000 ug | Freq: Once | INTRAMUSCULAR | Status: AC
  Administered 2011-03-13: 1000 ug via INTRAMUSCULAR

## 2011-03-13 NOTE — Telephone Encounter (Signed)
Have her schedule an OV first so we can talk about how she feels

## 2011-03-13 NOTE — Telephone Encounter (Signed)
Left voice message.

## 2011-03-13 NOTE — Telephone Encounter (Signed)
Pt was here for a vitamin b injection today and would like [place] to order some lab work and a UA. She will schedule a office visit to discuss the results. Please advise?

## 2011-03-27 ENCOUNTER — Encounter: Payer: Self-pay | Admitting: Family Medicine

## 2011-03-27 ENCOUNTER — Ambulatory Visit (INDEPENDENT_AMBULATORY_CARE_PROVIDER_SITE_OTHER): Payer: BC Managed Care – PPO | Admitting: Family Medicine

## 2011-03-27 VITALS — BP 118/78 | HR 72 | Temp 98.1°F | Wt 172.0 lb

## 2011-03-27 DIAGNOSIS — E538 Deficiency of other specified B group vitamins: Secondary | ICD-10-CM

## 2011-03-27 DIAGNOSIS — R7309 Other abnormal glucose: Secondary | ICD-10-CM

## 2011-03-27 DIAGNOSIS — R739 Hyperglycemia, unspecified: Secondary | ICD-10-CM

## 2011-03-27 DIAGNOSIS — E559 Vitamin D deficiency, unspecified: Secondary | ICD-10-CM

## 2011-03-27 NOTE — Progress Notes (Signed)
  Subjective:    Patient ID: Maria Schneider, female    DOB: 03-02-1946, 66 y.o.   MRN: [id number]  HPI Here to follow up on B12 deficiency. She has taken weekly B12 shots for 3 months and now we need to check a level. She also is worried about possible diabetes. She has had elevated glucoses for the past few years.    Review of Systems  Constitutional: Negative.   Respiratory: Negative.   Cardiovascular: Negative.        Objective:   Physical Exam  Constitutional: She appears well-developed and well-nourished.  Neck: No thyromegaly present.  Cardiovascular: Normal rate, regular rhythm, normal heart sounds and intact distal pulses.   Pulmonary/Chest: Effort normal and breath sounds normal.  Lymphadenopathy:    She has no cervical adenopathy.          Assessment & Plan:  Get labs as above

## 2011-03-28 LAB — HEMOGLOBIN A1C: Hgb A1c MFr Bld: 5.8 % (ref 4.6–6.5)

## 2011-03-29 ENCOUNTER — Other Ambulatory Visit: Payer: Self-pay | Admitting: Family Medicine

## 2011-03-30 ENCOUNTER — Telehealth: Payer: Self-pay | Admitting: Family Medicine

## 2011-03-30 NOTE — Telephone Encounter (Signed)
Pt called req lab results. Also pt needs to discuss an ov that pt has schd with gynecologist. Pls call.

## 2011-04-03 ENCOUNTER — Encounter: Payer: Self-pay | Admitting: Family Medicine

## 2011-04-03 ENCOUNTER — Telehealth: Payer: Self-pay | Admitting: Family Medicine

## 2011-04-03 MED ORDER — ERGOCALCIFEROL 1.25 MG (50000 UT) PO CAPS: 50000.0000 [IU] | ORAL_CAPSULE | ORAL | Status: DC

## 2011-04-03 NOTE — Progress Notes (Signed)
Quick Note:  I spoke with pt and sent script -scribe, also put a copy of results in mail. ______

## 2011-04-03 NOTE — Telephone Encounter (Signed)
Spoke with pt

## 2011-04-03 NOTE — Telephone Encounter (Signed)
Pt. Would like a call about lab results. Going out of town this afternoon...really needs a call back asap

## 2011-04-03 NOTE — Progress Notes (Signed)
Addended by: Aniceto Boss A on: 04/03/2011 01:42 PM   Modules accepted: Orders

## 2011-04-10 ENCOUNTER — Other Ambulatory Visit: Payer: Self-pay | Admitting: Obstetrics and Gynecology

## 2011-05-09 ENCOUNTER — Encounter: Payer: BC Managed Care – PPO | Admitting: Obstetrics and Gynecology

## 2011-05-26 ENCOUNTER — Other Ambulatory Visit: Payer: Self-pay | Admitting: Obstetrics and Gynecology

## 2011-07-10 ENCOUNTER — Other Ambulatory Visit: Payer: Self-pay | Admitting: Obstetrics and Gynecology

## 2011-07-17 ENCOUNTER — Encounter: Payer: Self-pay | Admitting: Obstetrics and Gynecology

## 2011-07-17 ENCOUNTER — Ambulatory Visit (INDEPENDENT_AMBULATORY_CARE_PROVIDER_SITE_OTHER): Payer: BC Managed Care – PPO | Admitting: Obstetrics and Gynecology

## 2011-07-17 VITALS — BP 124/78 | Ht 65.0 in | Wt 172.0 lb

## 2011-07-17 DIAGNOSIS — R102 Pelvic and perineal pain unspecified side: Secondary | ICD-10-CM

## 2011-07-17 DIAGNOSIS — Z01419 Encounter for gynecological examination (general) (routine) without abnormal findings: Secondary | ICD-10-CM

## 2011-07-17 DIAGNOSIS — N949 Unspecified condition associated with female genital organs and menstrual cycle: Secondary | ICD-10-CM

## 2011-07-17 MED ORDER — ESTRADIOL 0.05 MG/24HR TD PTTW: 1.0000 | MEDICATED_PATCH | TRANSDERMAL | Status: DC

## 2011-07-17 NOTE — Patient Instructions (Signed)
Schedule pelvic ultrasound

## 2011-07-17 NOTE — Progress Notes (Signed)
Patient came to see me today for her annual GYN exam. She is starting to  get hot flashes again and wanted to be treated. She has also had some pelvic pain which is really been going on for one year. She has discussed this with Dr. Daisey Must who feels this may be diverticulitis and wanted me to evaluate her for ovarian problem. She is having no vaginal bleeding. She does her lab work through Dr. Daisey Must. She is also having more  bladder problems. We have treated her for detrussor instability for a long time with Detrol LA with good results. She says it is still working but she still is having a little bit of urgency and frequency. In addition she is now having loss of sensation when she has to void. She is due for a mammogram. Her last bone density was last year and has remained normal.  HEENT: Within normal limits. Kennon Portela present. Neck: No masses. Supraclavicular lymph nodes: Not enlarged. Breasts: Examined in both sitting and lying position. Symmetrical without skin changes or masses. Abdomen: Soft no masses guarding or rebound. No hernias. Pelvic: External within normal limits. BUS within normal limits. Vaginal examination shows good estrogen effect, no cystocele enterocele or rectocele. Cervix and uterus absent. Adnexa within normal limits. Rectovaginal confirmatory. Extremities within normal limits.  Assessment: #1. Menopausal symptoms #2. Detrussor instability #3. Pelvic pain  Plan: Start her on a Vivelle dot patch. She will schedule mammogram. Pelvic ultrasound. Referral to Dr. Patsi Sears.

## 2011-07-18 LAB — URINALYSIS W MICROSCOPIC + REFLEX CULTURE
Bilirubin Urine: NEGATIVE
Glucose, UA: NEGATIVE mg/dL
Hgb urine dipstick: NEGATIVE
Protein, ur: NEGATIVE mg/dL

## 2011-07-19 LAB — URINE CULTURE: Colony Count: 30000

## 2011-07-26 ENCOUNTER — Ambulatory Visit (INDEPENDENT_AMBULATORY_CARE_PROVIDER_SITE_OTHER): Payer: BC Managed Care – PPO | Admitting: Obstetrics and Gynecology

## 2011-07-26 ENCOUNTER — Ambulatory Visit (INDEPENDENT_AMBULATORY_CARE_PROVIDER_SITE_OTHER): Payer: BC Managed Care – PPO

## 2011-07-26 DIAGNOSIS — N949 Unspecified condition associated with female genital organs and menstrual cycle: Secondary | ICD-10-CM

## 2011-07-26 DIAGNOSIS — R102 Pelvic and perineal pain: Secondary | ICD-10-CM

## 2011-07-26 DIAGNOSIS — N83339 Acquired atrophy of ovary and fallopian tube, unspecified side: Secondary | ICD-10-CM

## 2011-07-26 NOTE — Progress Notes (Signed)
Patient came back today for ultrasound due to her lower abdominal discomfort. On ultrasound her uterus is surgically absent. The right ovary is completely normal and atrophic. We could not identify the left ovary which is not unusual at her age. Our trouble was complicated by excessive bowel activity consistent with irritable bowel syndrome. However no masses were seen in either adnexa and I do not believe her symptoms are due to her ovaries. She was reassured. She will go back and discuss diverticulitis with Dr. Clent Ridges as well as irritable bowel syndrome. She will also go see Dr. Patsi Sears for her urological issues.

## 2011-08-01 ENCOUNTER — Other Ambulatory Visit: Payer: Self-pay | Admitting: Internal Medicine

## 2011-08-01 NOTE — Telephone Encounter (Signed)
Dr. Clent Ridges PT.

## 2011-08-16 ENCOUNTER — Telehealth: Payer: Self-pay | Admitting: *Deleted

## 2011-08-16 NOTE — Telephone Encounter (Signed)
Pt was giving Vivelle dot patch 0.05 and has noticed for about 2 weeks leg aching, no sharp pain, just muscle like aching. She would like to know if you think this could be a side effect from her patch? Aching doesn't get any worse but doesn't stop either. Pt wanted to check with you first before seeing Dr.Fry her PCP. Please advise

## 2011-08-16 NOTE — Telephone Encounter (Signed)
Pt informed with the below note, she will see Dr. Clent Ridges

## 2011-08-16 NOTE — Telephone Encounter (Signed)
She needs to see either Dr. Clent Ridges or me.  It may just be from switching to the patch but we need to be sure.

## 2011-08-17 ENCOUNTER — Encounter: Payer: Self-pay | Admitting: Family Medicine

## 2011-08-17 ENCOUNTER — Ambulatory Visit (INDEPENDENT_AMBULATORY_CARE_PROVIDER_SITE_OTHER): Payer: BC Managed Care – PPO | Admitting: Family Medicine

## 2011-08-17 VITALS — BP 116/74 | HR 77 | Temp 98.4°F | Wt 166.0 lb

## 2011-08-17 DIAGNOSIS — M545 Low back pain, unspecified: Secondary | ICD-10-CM

## 2011-08-17 DIAGNOSIS — M79605 Pain in left leg: Secondary | ICD-10-CM

## 2011-08-17 MED ORDER — DICLOFENAC SODIUM 75 MG PO TBEC: 75.0000 mg | DELAYED_RELEASE_TABLET | Freq: Two times a day (BID) | ORAL | Status: DC | PRN

## 2011-08-17 NOTE — Progress Notes (Signed)
  Subjective:    Patient ID: Maria Schneider, female    DOB: 09-07-1945, 66 y.o.   MRN: [id number]  HPI Here for 2 weeks of sharp pains running down both legs which seems to start in the lower back. No numbness or weakness. She has had low back pains off and on for years, and she had a lumbar MRI in 2008 showing a mild herniated disc at L5-S1. 2 weeks ago she spent an entire day walking around a shopping mall, and her legs have been hurting ever since. Advil helps somewhat but doesn't last long. The pain is often worse when sitting and better when up walking around.    Review of Systems  Constitutional: Negative.   Gastrointestinal: Negative.   Genitourinary: Negative.   Musculoskeletal: Positive for myalgias and back pain.  Neurological: Negative.        Objective:   Physical Exam  Constitutional: She appears well-developed and well-nourished.  Cardiovascular: Intact distal pulses.   Musculoskeletal:       She is tender in the lower back and especially so over the right sciatic notch. Full ROM, positive SLR          Assessment & Plan:  This seems to be sciatica pain, and I bet her disc disease has progressed over the years. Try Diclofenac prn. We will set up another lumbar MRI

## 2011-08-18 ENCOUNTER — Other Ambulatory Visit: Payer: Self-pay | Admitting: Obstetrics and Gynecology

## 2011-09-07 ENCOUNTER — Ambulatory Visit
Admission: RE | Admit: 2011-09-07 | Discharge: 2011-09-07 | Disposition: A | Discharge status: Home / Self Care | Payer: BC Managed Care – PPO | Source: Ambulatory Visit | Attending: Family Medicine | Admitting: Family Medicine

## 2011-09-07 DIAGNOSIS — M79605 Pain in left leg: Secondary | ICD-10-CM

## 2011-09-07 DIAGNOSIS — M545 Low back pain: Secondary | ICD-10-CM

## 2011-09-11 NOTE — Progress Notes (Signed)
Quick Note:  I spoke with pt ______ 

## 2011-12-13 ENCOUNTER — Other Ambulatory Visit: Payer: Self-pay | Admitting: Family Medicine

## 2011-12-14 ENCOUNTER — Ambulatory Visit (INDEPENDENT_AMBULATORY_CARE_PROVIDER_SITE_OTHER): Payer: BC Managed Care – PPO | Admitting: Family Medicine

## 2011-12-14 ENCOUNTER — Encounter: Payer: Self-pay | Admitting: Family Medicine

## 2011-12-14 VITALS — BP 118/68 | HR 64 | Temp 98.4°F | Wt 174.0 lb

## 2011-12-14 DIAGNOSIS — R0989 Other specified symptoms and signs involving the circulatory and respiratory systems: Secondary | ICD-10-CM

## 2011-12-14 DIAGNOSIS — R09A2 Foreign body sensation, throat: Secondary | ICD-10-CM

## 2011-12-14 DIAGNOSIS — F419 Anxiety disorder, unspecified: Secondary | ICD-10-CM

## 2011-12-14 DIAGNOSIS — F449 Dissociative and conversion disorder, unspecified: Secondary | ICD-10-CM

## 2011-12-14 DIAGNOSIS — Z23 Encounter for immunization: Secondary | ICD-10-CM

## 2011-12-14 DIAGNOSIS — M545 Low back pain, unspecified: Secondary | ICD-10-CM

## 2011-12-14 DIAGNOSIS — F411 Generalized anxiety disorder: Secondary | ICD-10-CM

## 2011-12-14 DIAGNOSIS — E559 Vitamin D deficiency, unspecified: Secondary | ICD-10-CM

## 2011-12-14 DIAGNOSIS — I10 Essential (primary) hypertension: Secondary | ICD-10-CM

## 2011-12-14 DIAGNOSIS — K219 Gastro-esophageal reflux disease without esophagitis: Secondary | ICD-10-CM

## 2011-12-14 NOTE — Addendum Note (Signed)
Addended by: Aniceto Boss A on: 12/14/2011 12:55 PM   Modules accepted: Orders

## 2011-12-14 NOTE — Progress Notes (Signed)
  Subjective:    Patient ID: Maria Schneider, female    DOB: 01-16-1946, 66 y.o.   MRN: [id number]  HPI Here to follow up on back pain and to ask about a lump in the throat. She had an MRI of the spine in June showing diffuse degenerative changes only. She has had a nice response to Diclofenac and no takes it only once a day at bedtime. For the past 4 weeks she has had an intermittent sensation of a lump in the throat. This is not painful and it does not affect her swallowing or her speech. This started the morning after she attended a party where they served sushi and some other very spicy foods. She had Prevacid at home but had not been taking it regularly. The night after the party she had horrible GERD with burning in the throat and the chest. She started taking Prevacid the next morning, and she has taken it every day since then. The GERD resolved quickly, but she has had this lump sensation ever since. She also admits to a lot of stress in the past few months. Her mother died in 05-25-22, and she has been trying to settle all her mother's affairs since then by herself.    Review of Systems  Constitutional: Negative.   Respiratory: Negative.   Cardiovascular: Negative.   Gastrointestinal: Negative.   Musculoskeletal: Positive for back pain.       Objective:   Physical Exam  Constitutional: She appears well-developed and well-nourished.  HENT:  Head: Normocephalic and atraumatic.  Right Ear: External ear normal.  Left Ear: External ear normal.  Nose: Nose normal.  Mouth/Throat: Oropharynx is clear and moist. No oropharyngeal exudate.  Eyes: Conjunctivae normal are normal. Pupils are equal, round, and reactive to light.  Neck: Neck supple. No tracheal deviation present. No thyromegaly present.  Cardiovascular: Normal rate, regular rhythm, normal heart sounds and intact distal pulses.   Pulmonary/Chest: Effort normal and breath sounds normal. No stridor.  Lymphadenopathy:    She has no  cervical adenopathy.          Assessment & Plan:  She has a globus in the throat, and I think this stems from a combination of anxiety and a bad episode of GERD. She will stay on daily Prevacid. She says the legal matters are close to being finished, so hopefully her anxiety levels will come down soon. She will be leaving town for work for the next month. We agreed that if she still has the lump sensation when she gets back in town that we would do some testing for this.

## 2011-12-29 ENCOUNTER — Other Ambulatory Visit (INDEPENDENT_AMBULATORY_CARE_PROVIDER_SITE_OTHER): Payer: BC Managed Care – PPO

## 2011-12-29 DIAGNOSIS — I10 Essential (primary) hypertension: Secondary | ICD-10-CM

## 2011-12-29 DIAGNOSIS — E559 Vitamin D deficiency, unspecified: Secondary | ICD-10-CM

## 2011-12-29 LAB — LIPID PANEL
Cholesterol: 242 mg/dL — ABNORMAL HIGH (ref 0–200)
HDL: 76.6 mg/dL (ref >39.00)
Total CHOL/HDL Ratio: 3
VLDL: 33.2 mg/dL (ref 0.0–40.0)

## 2011-12-29 LAB — CBC WITH DIFFERENTIAL/PLATELET
Basophils Relative: 0.4 % (ref 0.0–3.0)
Eosinophils Relative: 1.9 % (ref 0.0–5.0)
HCT: 34 % — ABNORMAL LOW (ref 36.0–46.0)
Hemoglobin: 11.3 g/dL — ABNORMAL LOW (ref 12.0–15.0)
Lymphs Abs: 0.7 10*3/uL (ref 0.7–4.0)
MCV: 85.7 fl (ref 78.0–100.0)
Monocytes Absolute: 0.4 10*3/uL (ref 0.1–1.0)
Monocytes Relative: 11.1 % (ref 3.0–12.0)
Neutro Abs: 2.2 10*3/uL (ref 1.4–7.7)
Platelets: 189 10*3/uL (ref 150.0–400.0)
WBC: 3.4 10*3/uL — ABNORMAL LOW (ref 4.5–10.5)

## 2011-12-29 LAB — HEPATIC FUNCTION PANEL
ALT: 25 U/L (ref 0–35)
Bilirubin, Direct: 0 mg/dL (ref 0.0–0.3)
Total Bilirubin: 0.3 mg/dL (ref 0.3–1.2)

## 2011-12-29 LAB — BASIC METABOLIC PANEL
Chloride: 106 mEq/L (ref 96–112)
Creatinine, Ser: 1.1 mg/dL (ref 0.4–1.2)
GFR: 54.26 mL/min — ABNORMAL LOW (ref >60.00)
Potassium: 4.4 mEq/L (ref 3.5–5.1)

## 2012-01-02 NOTE — Progress Notes (Signed)
Quick Note:  I left voice message for pt to return my call. ______ 

## 2012-01-03 NOTE — Progress Notes (Signed)
Quick Note:  I spoke with pt and she will schedule a office visit to discuss results. ______

## 2012-01-14 ENCOUNTER — Other Ambulatory Visit: Payer: Self-pay | Admitting: Family Medicine

## 2012-01-31 ENCOUNTER — Ambulatory Visit (INDEPENDENT_AMBULATORY_CARE_PROVIDER_SITE_OTHER): Payer: BC Managed Care – PPO | Admitting: Family Medicine

## 2012-01-31 ENCOUNTER — Encounter: Payer: Self-pay | Admitting: Family Medicine

## 2012-01-31 VITALS — BP 120/70 | HR 119 | Temp 98.2°F | Wt 174.0 lb

## 2012-01-31 DIAGNOSIS — M545 Low back pain, unspecified: Secondary | ICD-10-CM

## 2012-01-31 DIAGNOSIS — F449 Dissociative and conversion disorder, unspecified: Secondary | ICD-10-CM

## 2012-01-31 DIAGNOSIS — F3289 Other specified depressive episodes: Secondary | ICD-10-CM

## 2012-01-31 DIAGNOSIS — I10 Essential (primary) hypertension: Secondary | ICD-10-CM

## 2012-01-31 DIAGNOSIS — F458 Other somatoform disorders: Secondary | ICD-10-CM

## 2012-01-31 DIAGNOSIS — F32A Depression, unspecified: Secondary | ICD-10-CM

## 2012-01-31 DIAGNOSIS — F329 Major depressive disorder, single episode, unspecified: Secondary | ICD-10-CM

## 2012-01-31 NOTE — Progress Notes (Signed)
  Subjective:    Patient ID: Maria Schneider, female    DOB: 01-Mar-1946, 66 y.o.   MRN: [id number]  HPI Here to follow up on several issues. We saw her 2 months ago for globus which we attributed to anxiety. This has totally resolved and in fact it went away as soon as she went on her vacation. Her BP is stable. Her back pain is much improved and now she takes only one Diclofenac a day. She works out with a Systems analyst 3 days a week, and she she feels stronger and more energetic.    Review of Systems  Constitutional: Negative.   Respiratory: Negative.   Cardiovascular: Negative.   Musculoskeletal: Positive for back pain.       Objective:   Physical Exam  Constitutional: She appears well-developed and well-nourished.  Neck: No thyromegaly present.  Cardiovascular: Normal rate, regular rhythm, normal heart sounds and intact distal pulses.   Pulmonary/Chest: Effort normal and breath sounds normal.  Lymphadenopathy:    She has no cervical adenopathy.          Assessment & Plan:  She is doing well, and I encouraged her to stay on the current regimen.

## 2012-04-15 ENCOUNTER — Other Ambulatory Visit: Payer: Self-pay | Admitting: Family Medicine

## 2012-05-13 ENCOUNTER — Ambulatory Visit (INDEPENDENT_AMBULATORY_CARE_PROVIDER_SITE_OTHER): Payer: BC Managed Care – PPO | Admitting: Family Medicine

## 2012-05-13 ENCOUNTER — Encounter: Payer: Self-pay | Admitting: Family Medicine

## 2012-05-13 VITALS — BP 122/74 | HR 70 | Temp 98.5°F | Wt 171.0 lb

## 2012-05-13 DIAGNOSIS — M7520 Bicipital tendinitis, unspecified shoulder: Secondary | ICD-10-CM

## 2012-05-13 DIAGNOSIS — M7522 Bicipital tendinitis, left shoulder: Secondary | ICD-10-CM

## 2012-05-13 NOTE — Progress Notes (Signed)
  Subjective:    Patient ID: Maria Schneider, female    DOB: 11-06-1945, 67 y.o.   MRN: [id number]  HPI Here for 3 weeks of pain in the anterior left shoulder. No neck or arm pain. No recent trauma in this area, however 3 weeks ago she sustained a fracture and dislocation of there right 4th finger. She has been seeing Dr. Teressa Senter for this. She wore a sling for awhile and is now getting PT. This has forced her to use her left hand almost exclusively. She has had to clean her house, and carry around her infant grandson.    Review of Systems  Constitutional: Negative.   Musculoskeletal: Positive for arthralgias.       Objective:   Physical Exam  Constitutional: She appears well-developed and well-nourished.  Musculoskeletal:  Tender in the anterior left shoulder over the proximal biceps tendons. Full ROM           Assessment & Plan:  Rest, take Diclofenac, try ice packs. Recheck prn

## 2012-06-24 HISTORY — PX: COLONOSCOPY: SHX174

## 2012-07-01 ENCOUNTER — Telehealth: Payer: Self-pay | Admitting: Family Medicine

## 2012-07-01 NOTE — Telephone Encounter (Signed)
Unable to lmom mailbox full

## 2012-07-01 NOTE — Telephone Encounter (Signed)
I would need to see her first before such a referral

## 2012-07-01 NOTE — Telephone Encounter (Signed)
Pt is sch for 07-08-12

## 2012-07-01 NOTE — Telephone Encounter (Signed)
Pt saw ortho doc(sypher) who suggested she sees cardiologist due to shoulder pain. Dr sypher recommended Dr Excell Seltzer or Dr [place].. Pt needs referral

## 2012-07-08 ENCOUNTER — Encounter: Payer: Self-pay | Admitting: Family Medicine

## 2012-07-08 ENCOUNTER — Ambulatory Visit (INDEPENDENT_AMBULATORY_CARE_PROVIDER_SITE_OTHER)
Admission: RE | Admit: 2012-07-08 | Discharge: 2012-07-08 | Disposition: A | Discharge status: Home / Self Care | Payer: BC Managed Care – PPO | Source: Ambulatory Visit | Attending: Family Medicine | Admitting: Family Medicine

## 2012-07-08 ENCOUNTER — Ambulatory Visit (INDEPENDENT_AMBULATORY_CARE_PROVIDER_SITE_OTHER): Payer: BC Managed Care – PPO | Admitting: Family Medicine

## 2012-07-08 VITALS — BP 118/72 | HR 64 | Temp 98.1°F | Wt 170.0 lb

## 2012-07-08 DIAGNOSIS — M25512 Pain in left shoulder: Secondary | ICD-10-CM

## 2012-07-08 DIAGNOSIS — R079 Chest pain, unspecified: Secondary | ICD-10-CM

## 2012-07-08 DIAGNOSIS — M25519 Pain in unspecified shoulder: Secondary | ICD-10-CM

## 2012-07-08 NOTE — Progress Notes (Signed)
  Subjective:    Patient ID: Maria Schneider, female    DOB: 1945/04/24, 67 y.o.   MRN: [id number]  HPI We recently saw her for a pain in the left anterior shoulder that started about 6 months ago. This waxes and wanes, and it often bothers her the most when she is sitting still. It seems ot get better when she warms up and is exercising. No SOB or sweats or nausea. Advil makes it feel much better for a time. No neck pain. No arm symptoms more distally. She saw Dr. Teressa Senter recently who told her her shoulder was fine and seemed to think this may be a form of angina, so he sent her back to see [place].    Review of Systems  Constitutional: Negative.   Respiratory: Negative.   Cardiovascular: Negative.   Musculoskeletal: Positive for arthralgias.       Objective:   Physical Exam  Constitutional: She appears well-developed and well-nourished. No distress.  Cardiovascular: Normal rate, regular rhythm, normal heart sounds and intact distal pulses.   Pulmonary/Chest: Effort normal and breath sounds normal.  Musculoskeletal:  She is tender in the left anterior shoulder over the proximal insertion of the biceps tendons and the pectoral muscle tendons. No swelling. Full ROM           Assessment & Plan:  This is clearly some sort of musculoskeletal pain and not a cardiac issue. We will get a CXR today due to her hx of smoking. She is past due for a mammogram, so I urged her to set this up soon. We will refer to another Orthopedist for a second opinion.

## 2012-07-09 NOTE — Progress Notes (Signed)
Quick Note:  I left voice message with results. ______ 

## 2012-07-11 ENCOUNTER — Encounter: Payer: Self-pay | Admitting: Family Medicine

## 2012-09-21 ENCOUNTER — Other Ambulatory Visit: Payer: Self-pay | Admitting: Obstetrics and Gynecology

## 2012-09-21 ENCOUNTER — Other Ambulatory Visit: Payer: Self-pay | Admitting: Family Medicine

## 2012-09-23 NOTE — Telephone Encounter (Signed)
Pt will be called to schedule annual exam. KW 

## 2012-10-14 ENCOUNTER — Other Ambulatory Visit: Payer: Self-pay | Admitting: Family Medicine

## 2012-11-15 ENCOUNTER — Encounter: Payer: Self-pay | Admitting: Family Medicine

## 2012-11-15 ENCOUNTER — Ambulatory Visit (INDEPENDENT_AMBULATORY_CARE_PROVIDER_SITE_OTHER): Payer: Medicare Other | Admitting: Family Medicine

## 2012-11-15 VITALS — BP 136/80 | HR 101 | Temp 98.3°F | Wt 173.0 lb

## 2012-11-15 DIAGNOSIS — Z9109 Other allergy status, other than to drugs and biological substances: Secondary | ICD-10-CM

## 2012-11-15 DIAGNOSIS — H6982 Other specified disorders of Eustachian tube, left ear: Secondary | ICD-10-CM

## 2012-11-15 DIAGNOSIS — H6990 Unspecified Eustachian tube disorder, unspecified ear: Secondary | ICD-10-CM

## 2012-11-15 DIAGNOSIS — H6980 Other specified disorders of Eustachian tube, unspecified ear: Secondary | ICD-10-CM

## 2012-11-15 NOTE — Progress Notes (Signed)
  Subjective:    Patient ID: Maria Schneider, female    DOB: 1946/03/03, 67 y.o.   MRN: [id number]  HPI Here for 5 days of pressure in the left ear and decreased hearing. No pain. No sinus pressure or PND. She took a course of Augmentin about 2 weeks ago for a bronchitis, and this resolved quickly. Her typical ragweed allergies have started with itchy eyes, runny nose and sneezing. No fever.    Review of Systems  Constitutional: Negative.   HENT: Positive for hearing loss, rhinorrhea and sneezing. Negative for ear pain, congestion, sore throat, postnasal drip, sinus pressure and ear discharge.   Eyes: Positive for itching. Negative for discharge.  Respiratory: Negative.        Objective:   Physical Exam  Constitutional: She appears well-developed and well-nourished.  HENT:  Right Ear: External ear normal.  Left Ear: External ear normal.  Nose: Nose normal.  Mouth/Throat: Oropharynx is clear and moist.  Eyes: Conjunctivae are normal.  Pulmonary/Chest: Effort normal and breath sounds normal.  Lymphadenopathy:    She has no cervical adenopathy.          Assessment & Plan:  This is related to her allergies. Try Claritin D prn

## 2012-11-18 ENCOUNTER — Other Ambulatory Visit: Payer: Self-pay | Admitting: Gynecology

## 2012-11-19 NOTE — Telephone Encounter (Signed)
Pt will be called to schedule her annual exam. KW 

## 2012-11-20 ENCOUNTER — Telehealth: Payer: Self-pay | Admitting: Family Medicine

## 2012-11-20 NOTE — Telephone Encounter (Signed)
Per Dr. Clent Ridges, pt should not take. I spoke with pt and she had already taken the dose for today.

## 2012-11-20 NOTE — Telephone Encounter (Signed)
Caller: Maria Schneider/Patient; Phone: [phone]; Reason for Call: Pt has question regarding AZO, wants to know if she should stop before her UA /appt on 9-4 w/ Dr Clent Ridges.  Advised Pt, didn't think AZO interferred w/ Culture results.  PLEASE REVIEW W/ MD IF HE WOULD LIKE TO STOP AZO BEFORE APPT ON 9-4, IF PT NEEDS TO STOP PLEASE F/U.

## 2012-11-21 ENCOUNTER — Ambulatory Visit (INDEPENDENT_AMBULATORY_CARE_PROVIDER_SITE_OTHER): Payer: Medicare Other | Admitting: Family Medicine

## 2012-11-21 ENCOUNTER — Encounter: Payer: Self-pay | Admitting: Family Medicine

## 2012-11-21 VITALS — BP 112/68 | HR 75 | Temp 98.2°F | Wt 170.0 lb

## 2012-11-21 DIAGNOSIS — N390 Urinary tract infection, site not specified: Secondary | ICD-10-CM

## 2012-11-21 DIAGNOSIS — R300 Dysuria: Secondary | ICD-10-CM

## 2012-11-21 DIAGNOSIS — R309 Painful micturition, unspecified: Secondary | ICD-10-CM

## 2012-11-21 LAB — POCT URINALYSIS DIPSTICK
Glucose, UA: NEGATIVE
Nitrite, UA: POSITIVE

## 2012-11-21 MED ORDER — CIPROFLOXACIN HCL 500 MG PO TABS: 500.0000 mg | ORAL_TABLET | Freq: Two times a day (BID) | ORAL | Status: DC

## 2012-11-21 NOTE — Progress Notes (Signed)
  Subjective:    Patient ID: Maria Schneider, female    DOB: 12/12/1945, 67 y.o.   MRN: [id number]  HPI Here for 3 days of urinary burning and urgency. No fever.    Review of Systems  Constitutional: Negative.   Gastrointestinal: Negative.   Genitourinary: Positive for dysuria and frequency. Negative for hematuria, flank pain and pelvic pain.       Objective:   Physical Exam  Constitutional: She appears well-developed and well-nourished.  Abdominal: Soft. Bowel sounds are normal. She exhibits no distension and no mass. There is no tenderness. There is no rebound and no guarding.          Assessment & Plan:  Treat with Cipro. Drink fluids. Culture is pending.

## 2012-11-24 LAB — URINE CULTURE: Colony Count: >=100000

## 2012-11-25 NOTE — Progress Notes (Signed)
Quick Note:  I spoke with pt ______ 

## 2012-12-06 ENCOUNTER — Telehealth: Payer: Self-pay | Admitting: Family Medicine

## 2012-12-06 NOTE — Telephone Encounter (Signed)
done

## 2012-12-06 NOTE — Telephone Encounter (Signed)
Pt has done something to her lower back. Pt states she is In a lot of pain. Would ike a Monday appt. Is it ok to schedule? Only same days? Cannot come in today either.

## 2012-12-06 NOTE — Telephone Encounter (Signed)
Okay to schedule

## 2012-12-09 ENCOUNTER — Encounter: Payer: Self-pay | Admitting: Family Medicine

## 2012-12-09 ENCOUNTER — Ambulatory Visit (INDEPENDENT_AMBULATORY_CARE_PROVIDER_SITE_OTHER): Payer: Medicare Other | Admitting: Family Medicine

## 2012-12-09 VITALS — BP 120/80 | HR 73 | Temp 98.2°F | Wt 172.0 lb

## 2012-12-09 DIAGNOSIS — M545 Low back pain, unspecified: Secondary | ICD-10-CM

## 2012-12-09 DIAGNOSIS — M79604 Pain in right leg: Secondary | ICD-10-CM

## 2012-12-09 MED ORDER — CYCLOBENZAPRINE HCL 10 MG PO TABS: 10.0000 mg | ORAL_TABLET | Freq: Three times a day (TID) | ORAL | Status: DC | PRN

## 2012-12-09 MED ORDER — PREDNISONE 10 MG PO TABS: 10.0000 mg | ORAL_TABLET | Freq: Two times a day (BID) | ORAL | Status: DC

## 2012-12-09 NOTE — Progress Notes (Signed)
  Subjective:    Patient ID: Maria Schneider, female    DOB: 1945/11/28, 67 y.o.   MRN: [id number]  HPI Here for 5 days of intermittent severe pain in the right lower back that radiates down the right leg to the foot. No weakness or numbness. No recent trauma, but she notes that last weekend she watched her young grandchildren and had to do a lot of lifting and carrying with them. Using heat and Diclofenac with mixed results.    Review of Systems  Constitutional: Negative.   Musculoskeletal: Positive for back pain.       Objective:   Physical Exam  Constitutional: She appears well-developed and well-nourished.  In pain   Musculoskeletal:  Lot of spasm in the lower back with reduced ROM. Negative SLR. Tender over the right lower back and sciatic notch          Assessment & Plan:  Rest, heat. Try Prednisone and Flexeril. Recheck prn

## 2013-01-07 ENCOUNTER — Other Ambulatory Visit: Payer: Self-pay | Admitting: Gynecology

## 2013-01-16 ENCOUNTER — Ambulatory Visit (INDEPENDENT_AMBULATORY_CARE_PROVIDER_SITE_OTHER): Payer: Medicare Other

## 2013-01-16 DIAGNOSIS — Z23 Encounter for immunization: Secondary | ICD-10-CM

## 2013-01-20 ENCOUNTER — Other Ambulatory Visit: Payer: Self-pay | Admitting: Family Medicine

## 2013-01-29 ENCOUNTER — Other Ambulatory Visit: Payer: Self-pay | Admitting: Family Medicine

## 2013-02-21 ENCOUNTER — Encounter: Payer: Self-pay | Admitting: Family Medicine

## 2013-03-06 ENCOUNTER — Telehealth: Payer: Self-pay | Admitting: Family Medicine

## 2013-03-06 MED ORDER — TOLTERODINE TARTRATE ER 4 MG PO CP24: 4.0000 mg | ORAL_CAPSULE | Freq: Every day | ORAL | Status: DC

## 2013-03-06 NOTE — Telephone Encounter (Signed)
Refill request for Detrol LA 4 mg and [facility]

## 2013-04-19 ENCOUNTER — Other Ambulatory Visit: Payer: Self-pay | Admitting: Family Medicine

## 2013-04-27 ENCOUNTER — Other Ambulatory Visit: Payer: Self-pay | Admitting: Family Medicine

## 2013-06-05 ENCOUNTER — Telehealth: Payer: Self-pay | Admitting: Family Medicine

## 2013-06-05 NOTE — Telephone Encounter (Signed)
Pt needs refill on metoprolol 50 mg #30 with refills [facility] [address]. Pt is out of med

## 2013-06-06 MED ORDER — METOPROLOL SUCCINATE ER 50 MG PO TB24: ORAL_TABLET | ORAL | Status: DC

## 2013-06-06 NOTE — Telephone Encounter (Signed)
Rx sent to pharmacy   

## 2013-07-01 ENCOUNTER — Ambulatory Visit (INDEPENDENT_AMBULATORY_CARE_PROVIDER_SITE_OTHER): Payer: Medicare Other | Admitting: Family Medicine

## 2013-07-01 ENCOUNTER — Encounter: Payer: Self-pay | Admitting: Family Medicine

## 2013-07-01 VITALS — BP 112/80 | HR 97 | Temp 98.1°F | Ht 65.0 in | Wt 170.0 lb

## 2013-07-01 DIAGNOSIS — R82998 Other abnormal findings in urine: Secondary | ICD-10-CM

## 2013-07-01 DIAGNOSIS — N390 Urinary tract infection, site not specified: Secondary | ICD-10-CM

## 2013-07-01 DIAGNOSIS — K219 Gastro-esophageal reflux disease without esophagitis: Secondary | ICD-10-CM

## 2013-07-01 LAB — POCT URINALYSIS DIPSTICK
BILIRUBIN UA: NEGATIVE
GLUCOSE UA: NEGATIVE
KETONES UA: NEGATIVE
Leukocytes, UA: NEGATIVE
Nitrite, UA: NEGATIVE
Urobilinogen, UA: 1
pH, UA: 6

## 2013-07-01 MED ORDER — CIPROFLOXACIN HCL 500 MG PO TABS: 500.0000 mg | ORAL_TABLET | Freq: Two times a day (BID) | ORAL | Status: DC

## 2013-07-01 MED ORDER — LANSOPRAZOLE 30 MG PO CPDR: 30.0000 mg | DELAYED_RELEASE_CAPSULE | Freq: Every day | ORAL | Status: DC

## 2013-07-01 NOTE — Progress Notes (Signed)
Pre visit review using our clinic review tool, if applicable. No additional management support is needed unless otherwise documented below in the visit note. 

## 2013-07-01 NOTE — Progress Notes (Signed)
   Subjective:    Patient ID: Maria Schneider, female    DOB: April 08, 1945, 68 y.o.   MRN: [id number]  HPI Here for 2 things. First she thinks she may have another UTI. For 2 days she has had lower pelvic pain, urge to urinate, and a dark color to the urine. No fever. Also she has been having breakthrough GERD sx despite taking OTC Prevacid daily.    Review of Systems  Constitutional: Negative.   Respiratory: Negative.   Cardiovascular: Negative.   Gastrointestinal: Positive for abdominal pain. Negative for nausea, vomiting, diarrhea, constipation, blood in stool, abdominal distention, anal bleeding and rectal pain.  Genitourinary: Positive for urgency, frequency and pelvic pain. Negative for dysuria and flank pain.       Objective:   Physical Exam  Constitutional: She appears well-developed and well-nourished.  Abdominal: Soft. Bowel sounds are normal. She exhibits no distension and no mass. There is no tenderness. There is no rebound and no guarding.          Assessment & Plan:  Treat the UTI with Cipro and culture the urine. Increase Prevacid to 30 mg daily.

## 2013-07-03 LAB — URINE CULTURE
COLONY COUNT: NO GROWTH
Organism ID, Bacteria: NO GROWTH

## 2013-07-07 ENCOUNTER — Telehealth: Payer: Self-pay | Admitting: Family Medicine

## 2013-07-07 NOTE — Telephone Encounter (Signed)
Patient Information:  Caller Name: [name]  Phone: [phone]  Patient: Maria Schneider, Maria Schneider  Gender: Female  DOB: 1946/01/05  Age: 68 Years  PCP: [name] [facility])  Office Follow Up:  Does the office need to follow up with this patient?: No  Instructions For The Office: N/A  RN Note:  Pt agrees to ED tonight is sxs worsen or Office in the am if sxs are still present.  Concerned that pt currently has UTI and this could be a complication of the UTI.  Home care advice and call back information given  Symptoms  Reason For Call & Symptoms: Pt has been vomiting since 12:00.  Pt has been taking CIpro.  Pt is taking Cipro for UTI.  Today pt was having pain in lower back pain and took Diclofenac.  Spouse is concern about drug interactions.  For the past 5 days she has a band hurting under breast.  Pt was seen in office on 4/14.  Vomiting x 4 since 12 and diarrhea has also started.  Abd pain is present lower abd  in middle.  Spouse states that pain around chest has been present for "a while" and is not currently the problem  Reviewed Health History In EMR: Yes  Reviewed Medications In EMR: Yes  Reviewed Allergies In EMR: Yes  Reviewed Surgeries / Procedures: Yes  Date of Onset of Symptoms: 07/01/2013  Guideline(s) Used:  Vomiting  Disposition Per Guideline:   Home Care  Reason For Disposition Reached:   Vomiting with diarrhea  Advice Given:  Reassurance:  Vomiting can be caused by many types of illnesses. It can be caused by a stomach flu virus. It can be caused by eating or drinking something that disagreed with your stomach.  For Non-stop Vomiting, Try Sleeping:  Try to go to sleep (Reason: sleep often empties the stomach and relieves the need to vomit).  Clear Liquids:  Sip water or a rehydration drink (e.g., Gatorade or Powerade).  Call Back If:  Vomiting lasts for more than 2 days (48 hours)  Signs of dehydration occur  You become worse.  Patient Will Follow Care  Advice:  YES

## 2013-07-08 ENCOUNTER — Telehealth: Payer: Self-pay | Admitting: Family Medicine

## 2013-07-08 ENCOUNTER — Ambulatory Visit (INDEPENDENT_AMBULATORY_CARE_PROVIDER_SITE_OTHER): Payer: Medicare Other | Admitting: Family Medicine

## 2013-07-08 ENCOUNTER — Encounter: Payer: Self-pay | Admitting: Family Medicine

## 2013-07-08 VITALS — BP 122/78 | HR 90 | Temp 98.4°F | Wt 167.0 lb

## 2013-07-08 DIAGNOSIS — R1013 Epigastric pain: Secondary | ICD-10-CM

## 2013-07-08 LAB — POCT URINALYSIS DIPSTICK
Blood, UA: NEGATIVE
Glucose, UA: NEGATIVE
KETONES UA: NEGATIVE
Nitrite, UA: NEGATIVE
PH UA: 5.5
Spec Grav, UA: >=1.03
Urobilinogen, UA: >=8

## 2013-07-08 LAB — HEPATIC FUNCTION PANEL
ALT: 131 U/L — AB (ref 0–35)
AST: 100 U/L — AB (ref 0–37)
Albumin: 4.1 g/dL (ref 3.5–5.2)
Alkaline Phosphatase: 88 U/L (ref 39–117)
BILIRUBIN DIRECT: 0.8 mg/dL — AB (ref 0.0–0.3)
BILIRUBIN TOTAL: 1.8 mg/dL — AB (ref 0.3–1.2)
Total Protein: 7.2 g/dL (ref 6.0–8.3)

## 2013-07-08 LAB — CBC WITH DIFFERENTIAL/PLATELET
Basophils Absolute: 0 10*3/uL (ref 0.0–0.1)
Basophils Relative: 0.3 % (ref 0.0–3.0)
EOS PCT: 1.6 % (ref 0.0–5.0)
Eosinophils Absolute: 0.1 10*3/uL (ref 0.0–0.7)
HEMATOCRIT: 39.8 % (ref 36.0–46.0)
HEMOGLOBIN: 13 g/dL (ref 12.0–15.0)
LYMPHS ABS: 0.7 10*3/uL (ref 0.7–4.0)
Lymphocytes Relative: 12.6 % (ref 12.0–46.0)
MCHC: 32.6 g/dL (ref 30.0–36.0)
MCV: 84.9 fl (ref 78.0–100.0)
MONO ABS: 0.6 10*3/uL (ref 0.1–1.0)
MONOS PCT: 10.1 % (ref 3.0–12.0)
NEUTROS ABS: 4.2 10*3/uL (ref 1.4–7.7)
Neutrophils Relative %: 75.4 % (ref 43.0–77.0)
Platelets: 235 10*3/uL (ref 150.0–400.0)
RBC: 4.69 Mil/uL (ref 3.87–5.11)
RDW: 14.2 % (ref 11.5–14.6)
WBC: 5.6 10*3/uL (ref 4.5–10.5)

## 2013-07-08 LAB — BASIC METABOLIC PANEL
BUN: 29 mg/dL — ABNORMAL HIGH (ref 6–23)
CHLORIDE: 103 meq/L (ref 96–112)
CO2: 27 mEq/L (ref 19–32)
Calcium: 9.7 mg/dL (ref 8.4–10.5)
Creatinine, Ser: 1 mg/dL (ref 0.4–1.2)
GFR: 62.61 mL/min (ref >60.00)
Glucose, Bld: 94 mg/dL (ref 70–99)
POTASSIUM: 3.6 meq/L (ref 3.5–5.1)
SODIUM: 139 meq/L (ref 135–145)

## 2013-07-08 LAB — LIPASE: LIPASE: 389 U/L — AB (ref 11.0–59.0)

## 2013-07-08 LAB — AMYLASE: AMYLASE: 575 U/L — AB (ref 27–131)

## 2013-07-08 MED ORDER — PROMETHAZINE HCL 25 MG PO TABS: 25.0000 mg | ORAL_TABLET | ORAL | Status: DC | PRN

## 2013-07-08 NOTE — Telephone Encounter (Signed)
We just got this note this morning. Please call her to see how she is feeling today

## 2013-07-08 NOTE — Telephone Encounter (Signed)
Pt is here in office now to see Dr. Sarajane Jews.

## 2013-07-08 NOTE — Progress Notes (Signed)
   Subjective:    Patient ID: Maria Schneider, female    DOB: 15-Jan-1946, 68 y.o.   MRN: [id number]  HPI Here to check on abdominal pain and vomiting. She was here on 07-01-13 for lower abdominal pressure and urinary urgency. She started on Cipro. The urine culture was no growth. The urinary urgency improved, but then yesterday one hour after eating at a [employer] for lunch she developed sudden onset epigastric pain with nausea and vomiting. The vomitus looked like food, no black or red was seen. She also had diarrhea. No fever. Today she feels much better with no more vomiting bu slight nausea. No BMs since last night. She has also been on Prevacid 30 mg daily.    Review of Systems  Constitutional: Negative.   Respiratory: Negative.   Gastrointestinal: Positive for nausea, vomiting, abdominal pain and diarrhea. Negative for constipation, blood in stool, abdominal distention, anal bleeding and rectal pain.  Genitourinary: Negative.        Objective:   Physical Exam  Constitutional: She appears well-developed and well-nourished. No distress.  Abdominal: Soft. Bowel sounds are normal. She exhibits no distension and no mass. There is no rebound and no guarding.  Mildly tender in the epigastrium and in both lower quadrants          Assessment & Plan:  Possible enteritis, most likely bacterial from contaminated food but possibly viral. Drink fluids. Use Phenergan for nausea. Get labs today and set up an [place] soon.

## 2013-07-08 NOTE — Telephone Encounter (Signed)
Pt is on the schedule for today 07/08/13 to see Dr. Sarajane Jews.

## 2013-07-08 NOTE — Progress Notes (Signed)
Pre visit review using our clinic review tool, if applicable. No additional management support is needed unless otherwise documented below in the visit note. 

## 2013-07-08 NOTE — Telephone Encounter (Signed)
I left a voice message for pt to return my call.  

## 2013-07-08 NOTE — Telephone Encounter (Signed)
Patient Information:  Caller Name: Maryon  Phone: [phone]  Patient: Maria Schneider, Heenan  Gender: Female  DOB: [birth year]  Age: 68 Years  PCP: [name] [facility])  Office Follow Up:  Does the office need to follow up with this patient?: No  Instructions For The Office: N/A  RN Note:  Patient was advised to call back today to update her status.  Triaged 07/07/13 for upper abdominal discomfort x 1 week, and onset of vomiting 07/07/13.  States vomiting has resolved.  Has been taking pain pills for back pain.  Does have diarrhea.  Afebrile.  States she has been able to take small amounts of fluid, and is voiding qs.  States her abdominal pain is constant and unchanged x 1 week.  Patient advised being seen immediately due to presence of constant abdominal pain.  Declines ED; appt scheduled 1400 07/08/13 with Dr. Sarajane Jews.  krs/can  Symptoms  Reason For Call & Symptoms: vomiting  Reviewed Health History In EMR: Yes  Reviewed Medications In EMR: Yes  Reviewed Allergies In EMR: Yes  Reviewed Surgeries / Procedures: Yes  Date of Onset of Symptoms: 07/07/2013  Guideline(s) Used:  Vomiting  Disposition Per Guideline:   Go to ED Now (or to Office with PCP Approval)  Reason For Disposition Reached:   Constant abdominal pain lasting > 2 hours  Advice Given:  N/A  Patient Refused Recommendation:  Patient Refused Appt, Patient Requests Appt At Later Date  declines ED disposition; appt scheduled krs/can

## 2013-07-08 NOTE — Addendum Note (Signed)
Addended by: [name] A on: 07/08/2013 05:49 PM   Modules accepted: Orders

## 2013-07-09 ENCOUNTER — Other Ambulatory Visit (INDEPENDENT_AMBULATORY_CARE_PROVIDER_SITE_OTHER): Payer: Medicare Other

## 2013-07-09 ENCOUNTER — Emergency Department (HOSPITAL_COMMUNITY): Payer: Medicare Other

## 2013-07-09 ENCOUNTER — Inpatient Hospital Stay (HOSPITAL_COMMUNITY)
Admission: EM | Admit: 2013-07-09 | Discharge: 2013-07-15 | DRG: 418 | Disposition: A | Discharge status: Home / Self Care | Payer: Medicare Other | Attending: General Surgery | Admitting: General Surgery

## 2013-07-09 ENCOUNTER — Encounter (HOSPITAL_COMMUNITY): Payer: Self-pay | Admitting: Emergency Medicine

## 2013-07-09 DIAGNOSIS — E559 Vitamin D deficiency, unspecified: Secondary | ICD-10-CM | POA: Diagnosis present

## 2013-07-09 DIAGNOSIS — K8064 Calculus of gallbladder and bile duct with chronic cholecystitis without obstruction: Secondary | ICD-10-CM | POA: Diagnosis present

## 2013-07-09 DIAGNOSIS — R05 Cough: Secondary | ICD-10-CM | POA: Diagnosis present

## 2013-07-09 DIAGNOSIS — R7401 Elevation of levels of liver transaminase levels: Secondary | ICD-10-CM

## 2013-07-09 DIAGNOSIS — K859 Acute pancreatitis without necrosis or infection, unspecified: Principal | ICD-10-CM

## 2013-07-09 DIAGNOSIS — Z888 Allergy status to other drugs, medicaments and biological substances status: Secondary | ICD-10-CM

## 2013-07-09 DIAGNOSIS — K8309 Other cholangitis: Secondary | ICD-10-CM | POA: Diagnosis present

## 2013-07-09 DIAGNOSIS — Z885 Allergy status to narcotic agent status: Secondary | ICD-10-CM

## 2013-07-09 DIAGNOSIS — J9819 Other pulmonary collapse: Secondary | ICD-10-CM | POA: Diagnosis not present

## 2013-07-09 DIAGNOSIS — R7402 Elevation of levels of lactic acid dehydrogenase (LDH): Secondary | ICD-10-CM

## 2013-07-09 DIAGNOSIS — K219 Gastro-esophageal reflux disease without esophagitis: Secondary | ICD-10-CM | POA: Diagnosis present

## 2013-07-09 DIAGNOSIS — R740 Nonspecific elevation of levels of transaminase and lactic acid dehydrogenase [LDH]: Secondary | ICD-10-CM

## 2013-07-09 DIAGNOSIS — Z79899 Other long term (current) drug therapy: Secondary | ICD-10-CM

## 2013-07-09 DIAGNOSIS — E785 Hyperlipidemia, unspecified: Secondary | ICD-10-CM

## 2013-07-09 DIAGNOSIS — I10 Essential (primary) hypertension: Secondary | ICD-10-CM | POA: Diagnosis present

## 2013-07-09 DIAGNOSIS — R059 Cough, unspecified: Secondary | ICD-10-CM | POA: Diagnosis present

## 2013-07-09 DIAGNOSIS — R1013 Epigastric pain: Secondary | ICD-10-CM

## 2013-07-09 DIAGNOSIS — K5900 Constipation, unspecified: Secondary | ICD-10-CM | POA: Diagnosis present

## 2013-07-09 DIAGNOSIS — R7989 Other specified abnormal findings of blood chemistry: Secondary | ICD-10-CM | POA: Diagnosis present

## 2013-07-09 DIAGNOSIS — K8050 Calculus of bile duct without cholangitis or cholecystitis without obstruction: Secondary | ICD-10-CM

## 2013-07-09 DIAGNOSIS — R932 Abnormal findings on diagnostic imaging of liver and biliary tract: Secondary | ICD-10-CM

## 2013-07-09 DIAGNOSIS — E8779 Other fluid overload: Secondary | ICD-10-CM | POA: Diagnosis not present

## 2013-07-09 DIAGNOSIS — Z87891 Personal history of nicotine dependence: Secondary | ICD-10-CM

## 2013-07-09 DIAGNOSIS — Z8249 Family history of ischemic heart disease and other diseases of the circulatory system: Secondary | ICD-10-CM

## 2013-07-09 DIAGNOSIS — F329 Major depressive disorder, single episode, unspecified: Secondary | ICD-10-CM

## 2013-07-09 DIAGNOSIS — K851 Biliary acute pancreatitis without necrosis or infection: Secondary | ICD-10-CM | POA: Diagnosis present

## 2013-07-09 DIAGNOSIS — R918 Other nonspecific abnormal finding of lung field: Secondary | ICD-10-CM | POA: Diagnosis present

## 2013-07-09 DIAGNOSIS — R0902 Hypoxemia: Secondary | ICD-10-CM | POA: Diagnosis not present

## 2013-07-09 DIAGNOSIS — E538 Deficiency of other specified B group vitamins: Secondary | ICD-10-CM | POA: Diagnosis present

## 2013-07-09 DIAGNOSIS — J811 Chronic pulmonary edema: Secondary | ICD-10-CM | POA: Diagnosis present

## 2013-07-09 DIAGNOSIS — F3289 Other specified depressive episodes: Secondary | ICD-10-CM

## 2013-07-09 DIAGNOSIS — K8020 Calculus of gallbladder without cholecystitis without obstruction: Secondary | ICD-10-CM

## 2013-07-09 DIAGNOSIS — D649 Anemia, unspecified: Secondary | ICD-10-CM | POA: Diagnosis not present

## 2013-07-09 DIAGNOSIS — J45909 Unspecified asthma, uncomplicated: Secondary | ICD-10-CM | POA: Diagnosis present

## 2013-07-09 DIAGNOSIS — K8060 Calculus of gallbladder and bile duct with cholecystitis, unspecified, without obstruction: Secondary | ICD-10-CM | POA: Diagnosis present

## 2013-07-09 LAB — COMPREHENSIVE METABOLIC PANEL
ALT: 152 U/L — AB (ref 0–35)
AST: 173 U/L — AB (ref 0–37)
Albumin: 4 g/dL (ref 3.5–5.2)
Alkaline Phosphatase: 126 U/L — ABNORMAL HIGH (ref 39–117)
BUN: 22 mg/dL (ref 6–23)
CALCIUM: 10 mg/dL (ref 8.4–10.5)
CO2: 26 mEq/L (ref 19–32)
Chloride: 99 mEq/L (ref 96–112)
Creatinine, Ser: 0.88 mg/dL (ref 0.50–1.10)
GFR calc non Af Amer: 67 mL/min — ABNORMAL LOW (ref >90)
GFR, EST AFRICAN AMERICAN: 78 mL/min — AB (ref >90)
Glucose, Bld: 129 mg/dL — ABNORMAL HIGH (ref 70–99)
Potassium: 3.7 mEq/L (ref 3.7–5.3)
SODIUM: 139 meq/L (ref 137–147)
TOTAL PROTEIN: 7.2 g/dL (ref 6.0–8.3)
Total Bilirubin: 4.3 mg/dL — ABNORMAL HIGH (ref 0.3–1.2)

## 2013-07-09 LAB — CBC WITH DIFFERENTIAL/PLATELET
BASOS PCT: 0 % (ref 0–1)
Basophils Absolute: 0 10*3/uL (ref 0.0–0.1)
Eosinophils Absolute: 0 10*3/uL (ref 0.0–0.7)
Eosinophils Relative: 1 % (ref 0–5)
HEMATOCRIT: 38.7 % (ref 36.0–46.0)
HEMOGLOBIN: 12.4 g/dL (ref 12.0–15.0)
Lymphocytes Relative: 6 % — ABNORMAL LOW (ref 12–46)
Lymphs Abs: 0.3 10*3/uL — ABNORMAL LOW (ref 0.7–4.0)
MCH: 27.2 pg (ref 26.0–34.0)
MCHC: 32 g/dL (ref 30.0–36.0)
MCV: 84.9 fL (ref 78.0–100.0)
MONO ABS: 0.5 10*3/uL (ref 0.1–1.0)
Monocytes Relative: 12 % (ref 3–12)
NEUTROS ABS: 3.6 10*3/uL (ref 1.7–7.7)
Neutrophils Relative %: 81 % — ABNORMAL HIGH (ref 43–77)
Platelets: 172 10*3/uL (ref 150–400)
RBC: 4.56 MIL/uL (ref 3.87–5.11)
RDW: 13.4 % (ref 11.5–15.5)
WBC: 4.4 10*3/uL (ref 4.0–10.5)

## 2013-07-09 LAB — URINALYSIS, ROUTINE W REFLEX MICROSCOPIC
Glucose, UA: NEGATIVE mg/dL
Hgb urine dipstick: NEGATIVE
Ketones, ur: NEGATIVE mg/dL
NITRITE: NEGATIVE
Protein, ur: NEGATIVE mg/dL
SPECIFIC GRAVITY, URINE: 1.015 (ref 1.005–1.030)
UROBILINOGEN UA: 1 mg/dL (ref 0.0–1.0)
pH: 5.5 (ref 5.0–8.0)

## 2013-07-09 LAB — URINE MICROSCOPIC-ADD ON

## 2013-07-09 LAB — LIPASE, BLOOD

## 2013-07-09 LAB — I-STAT TROPONIN, ED: Troponin i, poc: 0 ng/mL (ref 0.00–0.08)

## 2013-07-09 MED ORDER — ACETAMINOPHEN 325 MG PO TABS: 650.0000 mg | ORAL_TABLET | Freq: Four times a day (QID) | ORAL | Status: DC | PRN

## 2013-07-09 MED ORDER — METOPROLOL SUCCINATE ER 50 MG PO TB24
50.0000 mg | ORAL_TABLET | Freq: Every day | ORAL | Status: DC
  Administered 2013-07-10 – 2013-07-15 (×6): 50 mg via ORAL
  Filled 2013-07-09 (×6): qty 1

## 2013-07-09 MED ORDER — BUPROPION HCL ER (XL) 300 MG PO TB24
300.0000 mg | ORAL_TABLET | Freq: Every day | ORAL | Status: DC
  Administered 2013-07-10 – 2013-07-15 (×6): 300 mg via ORAL
  Filled 2013-07-09 (×6): qty 1

## 2013-07-09 MED ORDER — SODIUM CHLORIDE 0.9 % IV SOLN: INTRAVENOUS | Status: DC

## 2013-07-09 MED ORDER — ONDANSETRON HCL 4 MG/2ML IJ SOLN
4.0000 mg | Freq: Three times a day (TID) | INTRAMUSCULAR | Status: DC | PRN
Start: 2013-07-09 — End: 2013-07-09

## 2013-07-09 MED ORDER — HYDROMORPHONE HCL PF 1 MG/ML IJ SOLN
1.0000 mg | INTRAMUSCULAR | Status: DC | PRN
  Administered 2013-07-09 – 2013-07-14 (×16): 1 mg via INTRAVENOUS
  Filled 2013-07-09 (×16): qty 1

## 2013-07-09 MED ORDER — SODIUM CHLORIDE 0.9 % IV SOLN
1000.0000 mL | Freq: Once | INTRAVENOUS | Status: AC
Start: 2013-07-09 — End: 2013-07-09
  Administered 2013-07-09: 1000 mL via INTRAVENOUS

## 2013-07-09 MED ORDER — MORPHINE SULFATE 4 MG/ML IJ SOLN
4.0000 mg | Freq: Once | INTRAMUSCULAR | Status: AC
  Administered 2013-07-09: 4 mg via INTRAVENOUS
  Filled 2013-07-09: qty 1

## 2013-07-09 MED ORDER — SERTRALINE HCL 100 MG PO TABS
100.0000 mg | ORAL_TABLET | Freq: Every day | ORAL | Status: DC
  Administered 2013-07-10 – 2013-07-15 (×6): 100 mg via ORAL
  Filled 2013-07-09 (×6): qty 1

## 2013-07-09 MED ORDER — SODIUM CHLORIDE 0.9 % IV SOLN
1000.0000 mL | INTRAVENOUS | Status: DC
  Administered 2013-07-09: 1000 mL via INTRAVENOUS

## 2013-07-09 MED ORDER — ONDANSETRON HCL 4 MG/2ML IJ SOLN
4.0000 mg | Freq: Four times a day (QID) | INTRAMUSCULAR | Status: DC | PRN
  Administered 2013-07-13: 4 mg via INTRAVENOUS
  Filled 2013-07-09: qty 2

## 2013-07-09 MED ORDER — HYDROMORPHONE HCL PF 1 MG/ML IJ SOLN
1.0000 mg | INTRAMUSCULAR | Status: DC | PRN
  Administered 2013-07-09: 1 mg via INTRAVENOUS
  Filled 2013-07-09: qty 1

## 2013-07-09 MED ORDER — EZETIMIBE 10 MG PO TABS
10.0000 mg | ORAL_TABLET | Freq: Every day | ORAL | Status: DC
  Administered 2013-07-10 – 2013-07-15 (×6): 10 mg via ORAL
  Filled 2013-07-09 (×6): qty 1

## 2013-07-09 MED ORDER — SODIUM CHLORIDE 0.9 % IV SOLN
INTRAVENOUS | Status: DC
Start: 2013-07-09 — End: 2013-07-11
  Administered 2013-07-09 – 2013-07-11 (×5): via INTRAVENOUS

## 2013-07-09 MED ORDER — ONDANSETRON HCL 4 MG/2ML IJ SOLN
4.0000 mg | Freq: Once | INTRAMUSCULAR | Status: AC
  Administered 2013-07-09: 4 mg via INTRAVENOUS
  Filled 2013-07-09: qty 2

## 2013-07-09 NOTE — ED Notes (Signed)
Pt c/o generalized abdominal pain x 1.5 weeks and n/v/d x "a couple days."  Pain score 7/10.  Pt was seen at PCP yesterday and today and blood work yesterday showed elevated WBC, liver enzymes, and pancreas enzymes.  Pt has an [place] scheduled for tomorrow.  MD concerned about common bile duct blockage.

## 2013-07-09 NOTE — ED Provider Notes (Signed)
CSN: [id number]     Arrival date & time 07/09/13  1443 History   First MD Initiated Contact with Patient 07/09/13 1519     Chief Complaint  Patient presents with  . Abdominal Pain  . Emesis  . Diarrhea     (Consider location/radiation/quality/duration/timing/severity/associated sxs/prior Treatment) HPI  Maria Schneider Is a 68 year old female who presents the emergency department with chief complaint of abdominal pain and dark urine.  Patient states that her symptoms began 10 days ago on Sunday, April 12.  The patient states that she was sitting in church and noticed that she was having some bilateral upper quadrant abdominal discomfort and distention.  The patient states that she is able to eat and drink normally and had no other symptoms at that time.  She will the next morning with dark orange urine.  She was seen by her primary care physician Dr. Sarajane Jews, who completed a urinalysis with culture which were both negative however started the patient on Cipro.  The patient states that her urine and became lighter throughout the week however she had continued constant abdominal pain as described previously.  She did complete her course of Cipro.  The patient states on Monday, 07/07/2013 she and her husband ate [place] food at lunch time.  Approximately an hour and a half later she became extremely ill with innumerable episodes of vomiting and nonbloody nonbilious vomitus and loose stools.  The patient states that her abdominal pain progressed from bilateral upper quadrants to the umbilical area and then down to the bilateral lower quadrants.  She had diffuse constant achy nonradiating abdominal pain since Monday which is unresolved.  Last episode of vomiting and loose stool was Monday night.  The patient did go back to her primary care physician on yesterday 07/08/2013 labs were drawn which showed marked abnormality involving the biliary tree with 3+ bilirubin in the urine, elevated BUN, elevated T. bili  and direct bili AST of 100 ALT of 130, and a lipase of 389. Patient denies any recent foreign travel or contacts with similar symptoms.  She denies alcohol abuse or overuse of Tylenol. Past Medical History  Diagnosis Date  . Hyperlipidemia   . Hypertension   . Vitamin D deficiency   . Vitamin B12 deficiency   . Depression   . Allergy   . Asthma   . Urinary incontinence   . GERD (gastroesophageal reflux disease)   . Leiomyoma   . Detrusor instability   . Osteopenia   . Fibroid    Past Surgical History  Procedure Laterality Date  . Breast biopsy  1972    benign  . Colonoscopy  06-14-09    per Dr. Earlean Shawl, one polyp, repeat 3 yrs   . Tubal ligation    . Tonsillectomy    . Abdominal hysterectomy  1994    TAH/ BLADDER SUSPENSION   Family History  Problem Relation Age of Onset  . Hypertension Mother   . Osteoporosis Mother   . Heart disease Father   . Osteoporosis Sister   . Breast cancer Paternal Aunt     Age 22's  . Diabetes Paternal Uncle    History  Substance Use Topics  . Smoking status: Former Smoker    Types: Cigarettes  . Smokeless tobacco: Never Used     Comment: quit 6 months ago  . Alcohol Use: 4.2 oz/week    7 Glasses of wine per week     Comment: 2 glasses of wine per day  OB History   Grav Para Term Preterm Abortions TAB SAB Ect Mult Living   2 1 1  1  1   1      Review of Systems  Ten systems reviewed and are negative for acute change, except as noted in the HPI.    Allergies  Ace inhibitors; Codeine; and Statins  Home Medications   Prior to Admission medications   Medication Sig Start Date End Date Taking? Authorizing Provider  buPROPion (WELLBUTRIN XL) 300 MG 24 hr tablet Take 300 mg by mouth daily.     Yes Historical Provider, MD  cholecalciferol (VITAMIN D) 1000 UNITS tablet Take 2,000 Units by mouth daily.   Yes Historical Provider, MD  cyclobenzaprine (FLEXERIL) 10 MG tablet Take 1 tablet (10 mg total) by mouth 3 (three) times daily as  needed for muscle spasms. 12/09/12  Yes Laurey Morale, MD  diclofenac (VOLTAREN) 75 MG EC tablet TAKE 1 TABLET (75 MG TOTAL) BY MOUTH 2 (TWO) TIMES DAILY AS NEEDED (PAIN ). 10/14/12  Yes Laurey Morale, MD  fenofibrate 160 MG tablet TAKE 1 TABLET EVERY DAY 04/19/13  Yes Laurey Morale, MD  lansoprazole (PREVACID) 30 MG capsule Take 1 capsule (30 mg total) by mouth daily at 12 noon. 07/01/13  Yes Laurey Morale, MD  metoprolol succinate (TOPROL-XL) 50 MG 24 hr tablet TAKE 1 TABLET EVERY DAY 06/06/13  Yes Laurey Morale, MD  naproxen (NAPROSYN) 250 MG tablet Take 500 mg by mouth 2 (two) times daily as needed (pain).   Yes Historical Provider, MD  promethazine (PHENERGAN) 25 MG tablet Take 1 tablet (25 mg total) by mouth every 4 (four) hours as needed for nausea or vomiting. 07/08/13  Yes Laurey Morale, MD  sertraline (ZOLOFT) 100 MG tablet Take 100 mg by mouth daily.    Yes Historical Provider, MD  ZETIA 10 MG tablet TAKE 1 TABLET BY MOUTH EVERY DAY 04/27/13  Yes Laurey Morale, MD   BP 137/73  Pulse 88  Temp(Src) 98.1 F (36.7 C) (Oral)  Resp 18  SpO2 97% Physical Exam  Vitals reviewed. Constitutional: She is oriented to person, place, and time. She appears well-developed and well-nourished. No distress.  HENT:  Head: Normocephalic and atraumatic.  Eyes: Conjunctivae are normal. No scleral icterus.  Neck: Normal range of motion.  Cardiovascular: Normal rate, regular rhythm and normal heart sounds.  Exam reveals no gallop and no friction rub.   No murmur heard. Pulmonary/Chest: Effort normal and breath sounds normal. No respiratory distress.  Abdominal: Soft. Bowel sounds are normal. She exhibits no distension and no mass. There is tenderness. There is no guarding.  Diffuse abdominal tenderness worse in the BL Upper quadrants  Neurological: She is alert and oriented to person, place, and time.  Skin: Skin is warm and dry. She is not diaphoretic.    ED Course  Procedures (including critical care  time) Labs Review [place], BLOOD  COMPREHENSIVE METABOLIC PANEL  CBC WITH DIFFERENTIAL  I-STAT [place], ED    Imaging Review No results found.   EKG Interpretation   Date/Time:  Wednesday July 09 2013 15:25:23 EDT Ventricular Rate:  80 PR Interval:  163 QRS Duration: 79 QT Interval:  380 QTC Calculation: 438 R Axis:   37 Text Interpretation:  Sinus rhythm Low voltage, precordial leads Abnormal  R-wave progression, early transition Borderline T abnormalities, anterior  leads No old for comparison Confirmed by WARD,  DO,  KRISTEN [phone]) on  07/09/2013 3:27:59 PM      MDM   Final diagnoses:  None    4:06 PM BP 137/73  Pulse 88  Temp(Src) 98.1 F (36.7 C) (Oral)  Resp 18  SpO2 97% Patient with lab abnormalities, ? Pancreatitis, choledocolithiasis, cholangitis. Hep panel ordered and sent by pcp.  4:55 PM The patient's ultrasound shows dilatation of the common bile duct concerning for obstructive process.  Lipase is greater than 3000 total bili refill remains elevated to 4.  She is no leukocytosis she has transaminitis an elevated alkaline phosphatase.  I discussed the findings with the patient and told her about her need for admission.  Patient agrees with plan of care.  Patient pain is well-controlled at this time with 4 mg of morphine.    5:45 PM Filed Vitals:   07/09/13 1700  BP: 144/68  Pulse: 105  Temp:   Resp: 23   Spoken with Dr. Ardis Hughs who is on call for gastroenterology.  He asked that the patient be admitted for gallstone pancreatitis and treated with fluids and pain medications.  He will reevaluate the patient in the morning with repeat labs.  I've also spoken to Dr. Broadus John who will admit the patient to the medicine service. The patient appears reasonably stabilized for admission considering the current resources, flow, and capabilities available in the ED at this time, and I doubt any other [facility]  requiring further screening and/or treatment in the ED prior to admission.   Margarita Mail, PA-C 07/09/13 1746

## 2013-07-09 NOTE — ED Provider Notes (Signed)
Medical screening examination/treatment/procedure(s) were conducted as a shared visit with non-physician practitioner(s) and myself.  I personally evaluated the patient during the encounter.   EKG Interpretation   Date/Time:  Wednesday July 09 2013 15:25:23 EDT Ventricular Rate:  80 PR Interval:  163 QRS Duration: 79 QT Interval:  380 QTC Calculation: 438 R Axis:   37 Text Interpretation:  Sinus rhythm Low voltage, precordial leads Abnormal  R-wave progression, early transition Borderline T abnormalities, anterior  leads No old for comparison Confirmed by WARD,  DO, KRISTEN ([id number]) on  07/09/2013 3:27:59 PM        Patient with upper abdominal pain for last week, has rising LFTs and lipase. Ultrasound was concerning for common bile duct stone. We'll consult GI and admit to medicine  Ephraim Hamburger, MD 07/09/13 2328

## 2013-07-09 NOTE — H&P (Signed)
[facility] History and Physical  Maria Schneider OEV:[id number] DOB: Jul 07, 1945 DOA: 07/09/2013  Referring physician: EDP PCP: Laurey Morale, MD   Chief Complaint: Abd pain  HPI: Maria Schneider is a 68 y.o. female with PMh of HTN, GERD, Depression presents to the ER today with the above complaint. Her symptoms started with Abd pain, epigastric 10days ago associated with dark urine, she saw Dr.Fry and was started on Abx for UTI. Despite this continued to have worsening pain, and then vomiting and diarrhea over the weekend. This vomiting and diarrhea subsided after 1 day but due to worsening pain went to Dr.Fry's office yesterday and had lab work done which showed abnormal LFTs and lipase, bili. Was asked to come back for repeat labs today but due to worsening symptoms presented to the ER today after d/w Dr.Fry's nurse. In ER, noted to have Gallstone pancreatitis, GI consulted will see pt in am   Review of Systems:  Constitutional:  No weight loss, night sweats, Fevers, chills, fatigue.  HEENT:  No headaches, Difficulty swallowing,Tooth/dental problems,Sore throat,  No sneezing, itching, ear ache, nasal congestion, post nasal drip,  Cardio-vascular:  No chest pain, Orthopnea, PND, swelling in lower extremities, anasarca, dizziness, palpitations  GI:  No heartburn, indigestion, abdominal pain, nausea, vomiting, diarrhea, change in bowel habits, loss of appetite  Resp:  No shortness of breath with exertion or at rest. No excess mucus, no productive cough, No non-productive cough, No coughing up of blood.No change in color of mucus.No wheezing.No chest wall deformity  Skin:  no rash or lesions.  GU:  no dysuria, change in color of urine, no urgency or frequency. No flank pain.  Musculoskeletal:  No joint pain or swelling. No decreased range of motion. No back pain.  Psych:  No change in mood or affect. No depression or anxiety. No memory loss.   Past Medical History    Diagnosis Date  . Hyperlipidemia   . Hypertension   . Vitamin D deficiency   . Vitamin B12 deficiency   . Depression   . Allergy   . Asthma   . Urinary incontinence   . GERD (gastroesophageal reflux disease)   . Leiomyoma   . Detrusor instability   . Osteopenia   . Fibroid    Past Surgical History  Procedure Laterality Date  . Breast biopsy  1972    benign  . Colonoscopy  06-14-09    per Dr. Earlean Shawl, one polyp, repeat 3 yrs   . Tubal ligation    . Tonsillectomy    . Abdominal hysterectomy  1994    TAH/ BLADDER SUSPENSION   Social History:  reports that she has quit smoking. Her smoking use included Cigarettes. She smoked 0.00 packs per day. She has never used smokeless tobacco. She reports that she drinks about 4.2 ounces of alcohol per week. She reports that she does not use illicit drugs.  Allergies  Allergen Reactions  . Ace Inhibitors     REACTION: cough  . Codeine Nausea And Vomiting  . Statins     REACTION: MYALGIAS    Family History  Problem Relation Age of Onset  . Hypertension Mother   . Osteoporosis Mother   . Heart disease Father   . Osteoporosis Sister   . Breast cancer Paternal Aunt     Age 31's  . Diabetes Paternal Uncle      Prior to Admission medications   Medication Sig Start Date End Date Taking? Authorizing Provider  buPROPion (WELLBUTRIN XL)  300 MG 24 hr tablet Take 300 mg by mouth daily.     Yes Historical Provider, MD  cholecalciferol (VITAMIN D) 1000 UNITS tablet Take 2,000 Units by mouth daily.   Yes Historical Provider, MD  cyclobenzaprine (FLEXERIL) 10 MG tablet Take 1 tablet (10 mg total) by mouth 3 (three) times daily as needed for muscle spasms. 12/09/12  Yes Laurey Morale, MD  diclofenac (VOLTAREN) 75 MG EC tablet TAKE 1 TABLET (75 MG TOTAL) BY MOUTH 2 (TWO) TIMES DAILY AS NEEDED (PAIN ). 10/14/12  Yes Laurey Morale, MD  fenofibrate 160 MG tablet TAKE 1 TABLET EVERY DAY 04/19/13  Yes Laurey Morale, MD  lansoprazole (PREVACID) 30 MG  capsule Take 1 capsule (30 mg total) by mouth daily at 12 noon. 07/01/13  Yes Laurey Morale, MD  metoprolol succinate (TOPROL-XL) 50 MG 24 hr tablet TAKE 1 TABLET EVERY DAY 06/06/13  Yes Laurey Morale, MD  naproxen (NAPROSYN) 250 MG tablet Take 500 mg by mouth 2 (two) times daily as needed (pain).   Yes Historical Provider, MD  promethazine (PHENERGAN) 25 MG tablet Take 1 tablet (25 mg total) by mouth every 4 (four) hours as needed for nausea or vomiting. 07/08/13  Yes Laurey Morale, MD  sertraline (ZOLOFT) 100 MG tablet Take 100 mg by mouth daily.    Yes Historical Provider, MD  ZETIA 10 MG tablet TAKE 1 TABLET BY MOUTH EVERY DAY 04/27/13  Yes Laurey Morale, MD   Physical Exam: Filed Vitals:   07/09/13 1700  BP: 144/68  Pulse: 105  Temp:   Resp: 23    BP 144/68  Pulse 105  Temp(Src) 98.1 F (36.7 C) (Oral)  Resp 23  SpO2 97%  General:  Appears calm and comfortable Eyes: PERRL, normal lids, irises & conjunctiva ENT: grossly normal hearing, lips & tongue Neck: no LAD, masses or thyromegaly Cardiovascular: RRR, no m/r/g. No LE edema. Respiratory: CTA bilaterally, no w/r/r. Normal respiratory effort. Abdomen: soft, mild epigastric and lower quadrant tenderness, BS present but diminished Skin: no rash or induration seen on limited exam Musculoskeletal: grossly normal tone BUE/BLE Psychiatric: grossly normal mood and affect, speech fluent and appropriate Neurologic: grossly non-focal.          Labs on Admission:  Basic Metabolic Panel:  Recent Labs Lab 07/08/13 1507 07/09/13 1550  NA 139 139  K 3.6 3.7  CL 103 99  CO2 27 26  GLUCOSE 94 129*  BUN 29* 22  CREATININE 1.0 0.88  CALCIUM 9.7 10.0   Liver Function Tests:  Recent Labs Lab 07/08/13 1507 07/09/13 1550  AST 100* 173*  ALT 131* 152*  ALKPHOS 88 126*  BILITOT 1.8* 4.3*  PROT 7.2 7.2  ALBUMIN 4.1 4.0    Recent Labs Lab 07/08/13 1507 07/09/13 1550  LIPASE 389.0* >3000*  AMYLASE 575*  --    No results  found for this basename: AMMONIA,  in the last 168 hours CBC:  Recent Labs Lab 07/08/13 1507 07/09/13 1550  WBC 5.6 4.4  NEUTROABS 4.2 3.6  HGB 13.0 12.4  HCT 39.8 38.7  MCV 84.9 84.9  PLT 235.0 172   Cardiac Enzymes: No results found for this basename: CKTOTAL, CKMB, CKMBINDEX, TROPONINI,  in the last 168 hours  BNP (last 3 results) No results found for this basename: PROBNP,  in the last 8760 hours CBG: No results found for this basename: GLUCAP,  in the last 168 hours  Radiological Exams on Admission: Dg Chest  2 View  07/09/2013   CLINICAL DATA:  Upper abdominal pain.  EXAM: CHEST  2 VIEW  COMPARISON:  PA and lateral chest 07/08/2012.  FINDINGS: Minimal linear atelectasis is seen in the left lung base. The lungs are otherwise clear. No pneumothorax or pleural effusion is identified. Heart size is normal.  IMPRESSION: No acute disease.   Electronically Signed   By: Inge Rise M.D.   On: 07/09/2013 16:44   US Abdomen Complete  07/09/2013   CLINICAL DATA:  Abdominal pain  EXAM: ULTRASOUND ABDOMEN COMPLETE  COMPARISON:  04/13/2010  FINDINGS: Gallbladder:  Multiple gallstones are noted. Mild wall thickening to 4 mm is seen. No pericholecystic fluid is noted. A negative sonographic Murphy's sign is elicited.  Common bile duct:  Diameter: 14 mm. This is greater than that expected for the patient's given age. It is also increased from the prior CT examination. This suggests a distal obstructive process. Correlation with laboratory values is recommended.  Liver:  No focal lesion identified. Within normal limits in parenchymal echogenicity.  IVC:  No abnormality visualized.  Pancreas:  Visualized portion unremarkable.  Spleen:  Size and appearance within normal limits.  Right Kidney:  Length: 10.1 cm. There is a focus of increased echogenicity identified within the cortex of the mid right kidney. This likely represents a focus of calcification of uncertain significance. No obstructive  changes are noted.  Left Kidney:  Length: 11.7 cm. Echogenicity within normal limits. No mass or hydronephrosis visualized.  Abdominal aorta:  No aneurysm visualized.  Other findings:  None.  IMPRESSION: Cholelithiasis .  Dilated common bile duct suggestive of an obstructive process. This may be related to a distal common bile duct stone. Correlation with laboratory values is recommended.  Focus of calcification in the mid right kidney of uncertain significance. This does not appear to be related to a calculus.   Electronically Signed   By: Inez Catalina M.D.   On: 07/09/2013 16:43    Assessment/Plan Principal Problem:   Gallstone pancreatitis -admit to Med-surg bed -IVF, supportive care, NPO/ice chips, anti-emetics, pain control -GI consulted per EDP, Dr.Perry will se pt in am -needs ERCP and then cholecystectomy    HYPERTENSION -stable, continue Toprol    GERD -continue PPI    Depression -continue home meds  DVT proph:SCDs pending procedures  Code Status: Full COde Family Communication: d/w spouse at bedside Disposition Plan: inpatient  Time spent:41min  [facility] Pager [phone]

## 2013-07-09 NOTE — ED Notes (Signed)
PA at bedside.

## 2013-07-09 NOTE — Progress Notes (Signed)
Utilization Review completed.  Wess Baney RN CM  

## 2013-07-09 NOTE — ED Notes (Signed)
Report given to [place], RN on floor

## 2013-07-10 ENCOUNTER — Other Ambulatory Visit: Payer: Medicare Other

## 2013-07-10 DIAGNOSIS — K8020 Calculus of gallbladder without cholecystitis without obstruction: Secondary | ICD-10-CM

## 2013-07-10 DIAGNOSIS — R740 Nonspecific elevation of levels of transaminase and lactic acid dehydrogenase [LDH]: Secondary | ICD-10-CM

## 2013-07-10 DIAGNOSIS — R932 Abnormal findings on diagnostic imaging of liver and biliary tract: Secondary | ICD-10-CM

## 2013-07-10 DIAGNOSIS — K859 Acute pancreatitis without necrosis or infection, unspecified: Secondary | ICD-10-CM

## 2013-07-10 DIAGNOSIS — R7402 Elevation of levels of lactic acid dehydrogenase (LDH): Secondary | ICD-10-CM

## 2013-07-10 DIAGNOSIS — R7401 Elevation of levels of liver transaminase levels: Secondary | ICD-10-CM

## 2013-07-10 LAB — COMPREHENSIVE METABOLIC PANEL
ALK PHOS: 100 U/L (ref 39–117)
ALT: 109 U/L — AB (ref 0–35)
AST: 97 U/L — AB (ref 0–37)
Albumin: 3.1 g/dL — ABNORMAL LOW (ref 3.5–5.2)
BUN: 17 mg/dL (ref 6–23)
CALCIUM: 8.5 mg/dL (ref 8.4–10.5)
CO2: 21 mEq/L (ref 19–32)
Chloride: 108 mEq/L (ref 96–112)
Creatinine, Ser: 0.83 mg/dL (ref 0.50–1.10)
GFR calc non Af Amer: 72 mL/min — ABNORMAL LOW (ref >90)
GFR, EST AFRICAN AMERICAN: 84 mL/min — AB (ref >90)
GLUCOSE: 81 mg/dL (ref 70–99)
POTASSIUM: 4.5 meq/L (ref 3.7–5.3)
SODIUM: 142 meq/L (ref 137–147)
Total Bilirubin: 1.1 mg/dL (ref 0.3–1.2)
Total Protein: 5.7 g/dL — ABNORMAL LOW (ref 6.0–8.3)

## 2013-07-10 LAB — CBC
HCT: 33.4 % — ABNORMAL LOW (ref 36.0–46.0)
HEMOGLOBIN: 10.8 g/dL — AB (ref 12.0–15.0)
MCH: 27.9 pg (ref 26.0–34.0)
MCHC: 32.3 g/dL (ref 30.0–36.0)
MCV: 86.3 fL (ref 78.0–100.0)
Platelets: 156 10*3/uL (ref 150–400)
RBC: 3.87 MIL/uL (ref 3.87–5.11)
RDW: 13.8 % (ref 11.5–15.5)
WBC: 3.8 10*3/uL — ABNORMAL LOW (ref 4.0–10.5)

## 2013-07-10 LAB — HEPATITIS PANEL, ACUTE
HCV Ab: NEGATIVE
HEP B C IGM: NONREACTIVE
Hep A IgM: NONREACTIVE
Hepatitis B Surface Ag: NEGATIVE

## 2013-07-10 LAB — PROTIME-INR
INR: 1.23 (ref 0.00–1.49)
Prothrombin Time: 15.2 seconds (ref 11.6–15.2)

## 2013-07-10 MED ORDER — ENOXAPARIN SODIUM 40 MG/0.4ML ~~LOC~~ SOLN
40.0000 mg | SUBCUTANEOUS | Status: DC
  Administered 2013-07-10 – 2013-07-14 (×4): 40 mg via SUBCUTANEOUS
  Filled 2013-07-10 (×7): qty 0.4

## 2013-07-10 NOTE — Progress Notes (Addendum)
[facility] PROGRESS NOTE  Maria Schneider ION:[id number] DOB: December 10, 1945 DOA: 07/09/2013 PCP: Laurey Morale, MD  Assessment/Plan: Acute Gallstone pancreatitis  -Improving, bili down, LFTS slightly better -IVF, supportive care, anti-emetics, pain control  -advance to clears if ok with GI -GI following, Dr.Perry to see -? ERCP vs MRCP and then cholecystectomy  -check liapse, Cmet in am  HYPERTENSION  -stable, continue Toprol   GERD  -continue PPI   Depression  -continue home meds   DVT proph:add lovenox  Code Status: Full COde  Family Communication: d/w spouse at bedside  Disposition Plan: inpatient  Consultants: [facility]  HPI/Subjective: abd pain improving  Objective: Filed Vitals:   07/10/13 1051  BP: 138/80  Pulse: 78  Temp:   Resp:     Intake/Output Summary (Last 24 hours) at 07/10/13 1304 Last data filed at 07/10/13 0900  Gross per 24 hour  Intake   1050 ml  Output    250 ml  Net    800 ml   Filed Weights   07/09/13 1928  Weight: 77.111 kg (170 lb)    Exam:   General:  AAOx3  HEENT: mild scleral icterus  Cardiovascular: S!S2/RRR  Respiratory: CTAB  Abdomen: soft, Mild epigastric tenderness, BS diminished  Musculoskeletal: no edema c/c  Data Reviewed: Basic Metabolic Panel:  Recent Labs Lab 07/08/13 1507 07/09/13 1550 07/10/13 0421  NA 139 139 142  K 3.6 3.7 4.5  CL 103 99 108  CO2 27 26 21   GLUCOSE 94 129* 81  BUN 29* 22 17  CREATININE 1.0 0.88 0.83  CALCIUM 9.7 10.0 8.5   Liver Function Tests:  Recent Labs Lab 07/08/13 1507 07/09/13 1550 07/10/13 0421  AST 100* 173* 97*  ALT 131* 152* 109*  ALKPHOS 88 126* 100  BILITOT 1.8* 4.3* 1.1  PROT 7.2 7.2 5.7*  ALBUMIN 4.1 4.0 3.1*    Recent Labs Lab 07/08/13 1507 07/09/13 1550  LIPASE 389.0* >3000*  AMYLASE 575*  --    No results found for this basename: AMMONIA,  in the last 168 hours CBC:  Recent Labs Lab 07/08/13 1507 07/09/13 1550 07/10/13 0421   WBC 5.6 4.4 3.8*  NEUTROABS 4.2 3.6  --   HGB 13.0 12.4 10.8*  HCT 39.8 38.7 33.4*  MCV 84.9 84.9 86.3  PLT 235.0 172 156   Cardiac Enzymes: No results found for this basename: CKTOTAL, CKMB, CKMBINDEX, TROPONINI,  in the last 168 hours BNP (last 3 results) No results found for this basename: PROBNP,  in the last 8760 hours CBG: No results found for this basename: GLUCAP,  in the last 168 hours  Recent Results (from the past 240 hour(s))  URINE CULTURE     Status: None   Collection Time    07/01/13  3:19 PM      Result Value Ref Range Status   Colony Count NO GROWTH   Final   Organism ID, Bacteria NO GROWTH   Final     Studies: Dg Chest 2 View  07/09/2013   CLINICAL DATA:  Upper abdominal pain.  EXAM: CHEST  2 VIEW  COMPARISON:  PA and lateral chest 07/08/2012.  FINDINGS: Minimal linear atelectasis is seen in the left lung base. The lungs are otherwise clear. No pneumothorax or pleural effusion is identified. Heart size is normal.  IMPRESSION: No acute disease.   Electronically Signed   By: Inge Rise M.D.   On: 07/09/2013 16:44   US Abdomen Complete  07/09/2013   CLINICAL DATA:  Abdominal pain  EXAM: ULTRASOUND ABDOMEN COMPLETE  COMPARISON:  04/13/2010  FINDINGS: Gallbladder:  Multiple gallstones are noted. Mild wall thickening to 4 mm is seen. No pericholecystic fluid is noted. A negative sonographic Murphy's sign is elicited.  Common bile duct:  Diameter: 14 mm. This is greater than that expected for the patient's given age. It is also increased from the prior CT examination. This suggests a distal obstructive process. Correlation with laboratory values is recommended.  Liver:  No focal lesion identified. Within normal limits in parenchymal echogenicity.  IVC:  No abnormality visualized.  Pancreas:  Visualized portion unremarkable.  Spleen:  Size and appearance within normal limits.  Right Kidney:  Length: 10.1 cm. There is a focus of increased echogenicity identified within  the cortex of the mid right kidney. This likely represents a focus of calcification of uncertain significance. No obstructive changes are noted.  Left Kidney:  Length: 11.7 cm. Echogenicity within normal limits. No mass or hydronephrosis visualized.  Abdominal aorta:  No aneurysm visualized.  Other findings:  None.  IMPRESSION: Cholelithiasis .  Dilated common bile duct suggestive of an obstructive process. This may be related to a distal common bile duct stone. Correlation with laboratory values is recommended.  Focus of calcification in the mid right kidney of uncertain significance. This does not appear to be related to a calculus.   Electronically Signed   By: Inez Catalina M.D.   On: 07/09/2013 16:43    Scheduled Meds: . buPROPion  300 mg Oral Daily  . ezetimibe  10 mg Oral Daily  . metoprolol succinate  50 mg Oral Daily  . sertraline  100 mg Oral Daily   Continuous Infusions: . sodium chloride 125 mL/hr at 07/10/13 1610   Antibiotics Given (last 72 hours)   None      Principal Problem:   Gallstone pancreatitis Active Problems:   HYPERTENSION   GERD   Gall stone pancreatitis    Time spent: 27min    [facility] Pager [phone]. If 7PM-7AM, please contact night-coverage at [email or web address], password [facility] 07/10/2013, 1:04 PM  LOS: 1 day

## 2013-07-10 NOTE — Consult Note (Signed)
Reason for Consult:  Gallstone pancreatitis Referring Physician:  DR Porfirio Oar is an 68 y.o. female.  HPI: Pt started having some abdominal pain about 06/29/13.  She says it was for the most part under her breast. On both sides, on 4/13, she started having dark urine, and the pain was no better.  She saw her PCP on 4/14 and he thought she had a UTI, cultures were negative, but he treated her with Cipro while waiting on cultures and she felt a little better, urine color improved.  Pain came back and urine color got darker, she was ultimately sent to the Ed on 07/08/13.  Work up showed lipase at 389, T Bil 1.8, transaminases were both up slightly.  WBC was normal.  Yesterday lipase was up to greater than 3000, and T bil went up to 4.3.  Today her bilirubin is down to 1.1transamianase are better.  Abdominal ultrasound:  Multiple gallstones are noted. Mild wall thickening to 4 mm is seen. No pericholecystic fluid is noted.  CBD was 14 mm, with possible choledocholithiasis.  We are ask to see.   Past Medical History  Diagnosis Date  . Hyperlipidemia   . Hypertension   . Vitamin D deficiency   . Vitamin B12 deficiency   . Depression   . Allergy   . Asthma   . Urinary incontinence   . GERD (gastroesophageal reflux disease)   . Leiomyoma   . Detrusor instability   . Osteopenia   . Fibroid     Past Surgical History  Procedure Laterality Date  . Breast biopsy  1972    benign  . Colonoscopy  06-14-09    per Dr. Earlean Shawl, one polyp, repeat 3 yrs   . Tubal ligation    . Tonsillectomy    . Abdominal hysterectomy  1994    TAH/ BLADDER SUSPENSION    Family History  Problem Relation Age of Onset  . Hypertension Mother   . Osteoporosis Mother   . Heart disease Father   . Osteoporosis Sister   . Breast cancer Paternal Aunt     Age 43's  . Diabetes Paternal Uncle     Social History:  reports that she quit smoking about 5 years ago. Her smoking use included Cigarettes. She  smoked 0.00 packs per day for 42 years. She has never used smokeless tobacco. She reports that she drinks about 4.2 ounces of alcohol per week. She reports that she does not use illicit drugs.  Allergies:  Allergies  Allergen Reactions  . Ace Inhibitors     REACTION: cough  . Codeine Nausea And Vomiting  . Statins     REACTION: MYALGIAS    Medications:  Prior to Admission:  Prescriptions prior to admission  Medication Sig Dispense Refill  . buPROPion (WELLBUTRIN XL) 300 MG 24 hr tablet Take 300 mg by mouth daily.        . cholecalciferol (VITAMIN D) 1000 UNITS tablet Take 2,000 Units by mouth daily.      . cyclobenzaprine (FLEXERIL) 10 MG tablet Take 1 tablet (10 mg total) by mouth 3 (three) times daily as needed for muscle spasms.  60 tablet  2  . diclofenac (VOLTAREN) 75 MG EC tablet TAKE 1 TABLET (75 MG TOTAL) BY MOUTH 2 (TWO) TIMES DAILY AS NEEDED (PAIN ).  60 tablet  3  . fenofibrate 160 MG tablet TAKE 1 TABLET EVERY DAY  30 tablet  3  . lansoprazole (PREVACID) 30 MG capsule  Take 1 capsule (30 mg total) by mouth daily at 12 noon.  30 capsule  11  . metoprolol succinate (TOPROL-XL) 50 MG 24 hr tablet TAKE 1 TABLET EVERY DAY  30 tablet  3  . naproxen (NAPROSYN) 250 MG tablet Take 500 mg by mouth 2 (two) times daily as needed (pain).      . promethazine (PHENERGAN) 25 MG tablet Take 1 tablet (25 mg total) by mouth every 4 (four) hours as needed for nausea or vomiting.  60 tablet  2  . sertraline (ZOLOFT) 100 MG tablet Take 100 mg by mouth daily.       [name] ZETIA 10 MG tablet TAKE 1 TABLET BY MOUTH EVERY DAY  30 tablet  2   Scheduled: . buPROPion  300 mg Oral Daily  . enoxaparin (LOVENOX) injection  40 mg Subcutaneous Q24H  . ezetimibe  10 mg Oral Daily  . metoprolol succinate  50 mg Oral Daily  . sertraline  100 mg Oral Daily   Continuous: . sodium chloride 125 mL/hr at 07/10/13 1329   BSW:[id number], HYDROmorphone (DILAUDID) injection, ondansetron (ZOFRAN) IV Anti-infectives    None      Results for orders placed during the hospital encounter of 07/09/13 (from the past 48 hour(s))  LIPASE, BLOOD     Status: Abnormal   Collection Time    07/09/13  3:50 PM      Result Value Ref Range   Lipase >3000 (*) 11 - 59 U/L   Comment: REPEATED TO VERIFY  COMPREHENSIVE METABOLIC PANEL     Status: Abnormal   Collection Time    07/09/13  3:50 PM      Result Value Ref Range   Sodium 139  137 - 147 mEq/L   Potassium 3.7  3.7 - 5.3 mEq/L   Chloride 99  96 - 112 mEq/L   CO2 26  19 - 32 mEq/L   Glucose, Bld 129 (*) 70 - 99 mg/dL   BUN 22  6 - 23 mg/dL   Creatinine, Ser 0.88  0.50 - 1.10 mg/dL   Calcium 10.0  8.4 - 10.5 mg/dL   Total Protein 7.2  6.0 - 8.3 g/dL   Albumin 4.0  3.5 - 5.2 g/dL   AST 173 (*) 0 - 37 U/L   ALT 152 (*) 0 - 35 U/L   Alkaline Phosphatase 126 (*) 39 - 117 U/L   Total Bilirubin 4.3 (*) 0.3 - 1.2 mg/dL   GFR calc non Af Amer 67 (*) >90 mL/min   GFR calc Af Amer 78 (*) >90 mL/min   Comment: (NOTE)     The eGFR has been calculated using the CKD EPI equation.     This calculation has not been validated in all clinical situations.     eGFR's persistently <90 mL/min signify possible Chronic Kidney     Disease.  CBC WITH DIFFERENTIAL     Status: Abnormal   Collection Time    07/09/13  3:50 PM      Result Value Ref Range   WBC 4.4  4.0 - 10.5 K/uL   RBC 4.56  3.87 - 5.11 MIL/uL   Hemoglobin 12.4  12.0 - 15.0 g/dL   HCT 38.7  36.0 - 46.0 %   MCV 84.9  78.0 - 100.0 fL   MCH 27.2  26.0 - 34.0 pg   MCHC 32.0  30.0 - 36.0 g/dL   RDW 13.4  11.5 - 15.5 %   Platelets 172  150 -  400 K/uL   Neutrophils Relative % 81 (*) 43 - 77 %   Neutro Abs 3.6  1.7 - 7.7 K/uL   Lymphocytes Relative 6 (*) 12 - 46 %   Lymphs Abs 0.3 (*) 0.7 - 4.0 K/uL   Monocytes Relative 12  3 - 12 %   Monocytes Absolute 0.5  0.1 - 1.0 K/uL   Eosinophils Relative 1  0 - 5 %   Eosinophils Absolute 0.0  0.0 - 0.7 K/uL   Basophils Relative 0  0 - 1 %   Basophils Absolute 0.0  0.0  - 0.1 K/uL  URINALYSIS, ROUTINE W REFLEX MICROSCOPIC     Status: Abnormal   Collection Time    07/09/13  3:56 PM      Result Value Ref Range   Color, Urine ORANGE (*) YELLOW   Comment: BIOCHEMICALS MAY BE AFFECTED BY COLOR   APPearance CLOUDY (*) CLEAR   Specific Gravity, Urine 1.015  1.005 - 1.030   pH 5.5  5.0 - 8.0   Glucose, UA NEGATIVE  NEGATIVE mg/dL   Hgb urine dipstick NEGATIVE  NEGATIVE   Bilirubin Urine LARGE (*) NEGATIVE   Ketones, ur NEGATIVE  NEGATIVE mg/dL   Protein, ur NEGATIVE  NEGATIVE mg/dL   Urobilinogen, UA 1.0  0.0 - 1.0 mg/dL   Nitrite NEGATIVE  NEGATIVE   Leukocytes, UA TRACE (*) NEGATIVE  URINE MICROSCOPIC-ADD ON     Status: Abnormal   Collection Time    07/09/13  3:56 PM      Result Value Ref Range   Squamous Epithelial / LPF RARE  RARE   WBC, UA 0-2  <3 WBC/hpf   RBC / HPF 0-2  <3 RBC/hpf   Bacteria, UA FEW (*) RARE   Crystals CA OXALATE CRYSTALS (*) NEGATIVE   Urine-Other MUCOUS PRESENT    I-STAT TROPOININ, ED     Status: None   Collection Time    07/09/13  4:00 PM      Result Value Ref Range   Troponin i, poc 0.00  0.00 - 0.08 ng/mL   Comment 3            Comment: Due to the release kinetics of cTnI,     a negative result within the first hours     of the onset of symptoms does not rule out     myocardial infarction with certainty.     If myocardial infarction is still suspected,     repeat the test at appropriate intervals.  COMPREHENSIVE METABOLIC PANEL     Status: Abnormal   Collection Time    07/10/13  4:21 AM      Result Value Ref Range   Sodium 142  137 - 147 mEq/L   Potassium 4.5  3.7 - 5.3 mEq/L   Comment: REPEATED TO VERIFY     DELTA CHECK NOTED   Chloride 108  96 - 112 mEq/L   Comment: REPEATED TO VERIFY     DELTA CHECK NOTED   CO2 21  19 - 32 mEq/L   Glucose, Bld 81  70 - 99 mg/dL   BUN 17  6 - 23 mg/dL   Creatinine, Ser 0.83  0.50 - 1.10 mg/dL   Calcium 8.5  8.4 - 10.5 mg/dL   Total Protein 5.7 (*) 6.0 - 8.3 g/dL    Albumin 3.1 (*) 3.5 - 5.2 g/dL   AST 97 (*) 0 - 37 U/L   ALT 109 (*) 0 - 35 U/L  Alkaline Phosphatase 100  39 - 117 U/L   Total Bilirubin 1.1  0.3 - 1.2 mg/dL   Comment: REPEATED TO VERIFY     DELTA CHECK NOTED   GFR calc non Af Amer 72 (*) >90 mL/min   GFR calc Af Amer 84 (*) >90 mL/min   Comment: (NOTE)     The eGFR has been calculated using the CKD EPI equation.     This calculation has not been validated in all clinical situations.     eGFR's persistently <90 mL/min signify possible Chronic Kidney     Disease.  CBC     Status: Abnormal   Collection Time    07/10/13  4:21 AM      Result Value Ref Range   WBC 3.8 (*) 4.0 - 10.5 K/uL   RBC 3.87  3.87 - 5.11 MIL/uL   Hemoglobin 10.8 (*) 12.0 - 15.0 g/dL   HCT 33.4 (*) 36.0 - 46.0 %   MCV 86.3  78.0 - 100.0 fL   MCH 27.9  26.0 - 34.0 pg   MCHC 32.3  30.0 - 36.0 g/dL   RDW 13.8  11.5 - 15.5 %   Platelets 156  150 - 400 K/uL  PROTIME-INR     Status: None   Collection Time    07/10/13  4:21 AM      Result Value Ref Range   Prothrombin Time 15.2  11.6 - 15.2 seconds   INR 1.23  0.00 - 1.49    Dg Chest 2 View  07/09/2013   CLINICAL DATA:  Upper abdominal pain.  EXAM: CHEST  2 VIEW  COMPARISON:  PA and lateral chest 07/08/2012.  FINDINGS: Minimal linear atelectasis is seen in the left lung base. The lungs are otherwise clear. No pneumothorax or pleural effusion is identified. Heart size is normal.  IMPRESSION: No acute disease.   Electronically Signed   By: Inge Rise M.D.   On: 07/09/2013 16:44   US Abdomen Complete  07/09/2013   CLINICAL DATA:  Abdominal pain  EXAM: ULTRASOUND ABDOMEN COMPLETE  COMPARISON:  04/13/2010  FINDINGS: Gallbladder:  Multiple gallstones are noted. Mild wall thickening to 4 mm is seen. No pericholecystic fluid is noted. A negative sonographic Murphy's sign is elicited.  Common bile duct:  Diameter: 14 mm. This is greater than that expected for the patient's given age. It is also increased from the  prior CT examination. This suggests a distal obstructive process. Correlation with laboratory values is recommended.  Liver:  No focal lesion identified. Within normal limits in parenchymal echogenicity.  IVC:  No abnormality visualized.  Pancreas:  Visualized portion unremarkable.  Spleen:  Size and appearance within normal limits.  Right Kidney:  Length: 10.1 cm. There is a focus of increased echogenicity identified within the cortex of the mid right kidney. This likely represents a focus of calcification of uncertain significance. No obstructive changes are noted.  Left Kidney:  Length: 11.7 cm. Echogenicity within normal limits. No mass or hydronephrosis visualized.  Abdominal aorta:  No aneurysm visualized.  Other findings:  None.  IMPRESSION: Cholelithiasis .  Dilated common bile duct suggestive of an obstructive process. This may be related to a distal common bile duct stone. Correlation with laboratory values is recommended.  Focus of calcification in the mid right kidney of uncertain significance. This does not appear to be related to a calculus.   Electronically Signed   By: Inez Catalina M.D.   On: 07/09/2013 16:43  Review of Systems  Constitutional: Positive for fever. Negative for weight loss, malaise/fatigue and diaphoresis. Chills: 1.5 weeks ago when symptoms started.  HENT: Negative.   Eyes: Negative.   Respiratory: Negative.   Cardiovascular: Negative.   Gastrointestinal: Positive for heartburn, nausea, vomiting, abdominal pain and diarrhea. Negative for blood in stool.  Genitourinary: Negative.        She had dark urine when this started.  It got better with Cipro but reoccurred.  Still dark  Musculoskeletal: Negative.   Skin: Negative.   Neurological: Negative.  Negative for weakness.  Endo/Heme/Allergies: Negative.   Psychiatric/Behavioral: Negative.    Blood pressure 148/77, pulse 77, temperature 98.8 F (37.1 C), temperature source Oral, resp. rate 18, height _0  (1.651  m), weight 77.111 kg (170 lb), SpO2 99.00%. Physical Exam  Constitutional: She is oriented to person, place, and time. She appears well-developed and well-nourished. No distress.  HENT:  Head: Normocephalic and atraumatic.  Nose: Nose normal.  Eyes: Conjunctivae and EOM are normal. Pupils are equal, round, and reactive to light. Right eye exhibits no discharge. Left eye exhibits no discharge. No scleral icterus.  Neck: Normal range of motion. Neck supple. No JVD present. No tracheal deviation present. No thyromegaly present.  Cardiovascular: Normal rate, regular rhythm, normal heart sounds and intact distal pulses.  Exam reveals no gallop.   No murmur heard. Respiratory: Effort normal and breath sounds normal. No respiratory distress. She has no wheezes. She has no rales. She exhibits tenderness (she still has some tenderness lower rib cage).  GI: Soft. Bowel sounds are normal. She exhibits no distension and no mass. There is tenderness (RUQ is most tender, but she has some genralized discomfort mid and over remaining abdomen.). There is no rebound and no guarding.  Musculoskeletal: She exhibits no edema.  Lymphadenopathy:    She has no cervical adenopathy.  Neurological: She is alert and oriented to person, place, and time. No cranial nerve deficit.  Skin: Skin is warm and dry. No rash noted. She is not diaphoretic. No erythema. No pallor.  Psychiatric: She has a normal mood and affect. Her behavior is normal. Judgment and thought content normal.    Assessment/Plan: 1. Cholelithiasis with obstruction, stone may have passed again. 2.  Pancreatitis 3.  Hypertension 4.  GERD 5.  Tobacco use for 36 year, quit age 87 5.  Mild alcohol use. 6.  Hx of depression  7.  B12 and Vitamin D deficiencey 8.  Hx of asthma   Plan:  We will let her pancreatitis resolve, watch LFT/lipase and see if she needs  MRCP/ERCP.  Once these issues are resolved we can plan cholecystectomy.  Labs are already  ordered for tomorrow.    Earnstine Regal 07/10/2013, 3:27 PM

## 2013-07-10 NOTE — Consult Note (Signed)
Referring Provider: No ref. provider found Primary Care Physician:  Laurey Morale, MD Primary Gastroenterologist:  Dr. Earlean Shawl  Reason for Consultation:  Elevated LFT's, pancreatitis. gallstones  HPI: Maria Schneider is a 68 y.o. female with PMH of HTN, HLD, GERD, Depression who presented to the ER at [facility] hospital on 4/22 with complaints of progressively worsening abdominal pain.  Her symptoms began approximately 10 days ago with diffuse abdominal pain and generally not feeling well.  There was also associated dark urine.  She saw Dr.Fry, her PCP, and was started on Abx (cipro) for UTI despite normal urinalysis/culture.  She saw him again on 4/21 for worsening symptoms as well as nausea and vomiting on 4/20 at which time her lipase was elevated at 389 and her LFT's showed a bili of 1.8, AST 100, ALT 131, and ALP 88.  Outpatient ultrasound was ordered.  She continue to have worsening pain and presented to the ED on 4/22.  CBC was normal, BMP unremarkable.  LFT's were further elevated to total bili 4.3, AST 173, ALT 152, and ALP 126.  Lipase was >3000.  Acute viral hepatitis panel was negative.  She was admitted, started on IVF's at 125 cc/hr with pain medication prn.  Ultrasound was ordered and showed cholelithiasis with dilated common bile duct suggestive of an obstructive process that may be related to a distal common bile duct stone.  GI was consulted.  This AM her pain is better due to receiving pain medication.  Urine is still dark, but LFT's are improved to total bili 1.1, AST 97, ALT 109, ALP 100.  CBC shows drop in Hgb to 10.8, which is likely dilutional secondary to high volume IVF's.  She does drink some ETOH but not in excess; had 2 glasses of wine 2 days prior to the onset of symptoms.  Just of note, she had colonoscopy in 06/2012 by Dr. Earlean Shawl.   Past Medical History  Diagnosis Date  . Hyperlipidemia   . Hypertension   . Vitamin D deficiency   . Vitamin B12 deficiency   . Depression    . Allergy   . Asthma   . Urinary incontinence   . GERD (gastroesophageal reflux disease)   . Leiomyoma   . Detrusor instability   . Osteopenia   . Fibroid     Past Surgical History  Procedure Laterality Date  . Breast biopsy  1972    benign  . Colonoscopy  06-14-09    per Dr. Earlean Shawl, one polyp, repeat 3 yrs   . Tubal ligation    . Tonsillectomy    . Abdominal hysterectomy  1994    TAH/ BLADDER SUSPENSION    Prior to Admission medications   Medication Sig Start Date End Date Taking? Authorizing Provider  buPROPion (WELLBUTRIN XL) 300 MG 24 hr tablet Take 300 mg by mouth daily.     Yes Historical Provider, MD  cholecalciferol (VITAMIN D) 1000 UNITS tablet Take 2,000 Units by mouth daily.   Yes Historical Provider, MD  cyclobenzaprine (FLEXERIL) 10 MG tablet Take 1 tablet (10 mg total) by mouth 3 (three) times daily as needed for muscle spasms. 12/09/12  Yes Laurey Morale, MD  diclofenac (VOLTAREN) 75 MG EC tablet TAKE 1 TABLET (75 MG TOTAL) BY MOUTH 2 (TWO) TIMES DAILY AS NEEDED (PAIN ). 10/14/12  Yes Laurey Morale, MD  fenofibrate 160 MG tablet TAKE 1 TABLET EVERY DAY 04/19/13  Yes Laurey Morale, MD  lansoprazole (PREVACID) 30 MG capsule Take 1  capsule (30 mg total) by mouth daily at 12 noon. 07/01/13  Yes Laurey Morale, MD  metoprolol succinate (TOPROL-XL) 50 MG 24 hr tablet TAKE 1 TABLET EVERY DAY 06/06/13  Yes Laurey Morale, MD  naproxen (NAPROSYN) 250 MG tablet Take 500 mg by mouth 2 (two) times daily as needed (pain).   Yes Historical Provider, MD  promethazine (PHENERGAN) 25 MG tablet Take 1 tablet (25 mg total) by mouth every 4 (four) hours as needed for nausea or vomiting. 07/08/13  Yes Laurey Morale, MD  sertraline (ZOLOFT) 100 MG tablet Take 100 mg by mouth daily.    Yes Historical Provider, MD  ZETIA 10 MG tablet TAKE 1 TABLET BY MOUTH EVERY DAY 04/27/13  Yes Laurey Morale, MD    Current Facility-Administered Medications  Medication Dose Route Frequency Provider Last Rate  Last Dose  . 0.9 %  sodium chloride infusion   Intravenous Continuous Domenic Polite, MD 125 mL/hr at 07/10/13 0102    . acetaminophen (TYLENOL) tablet 650 mg  650 mg Oral Q6H PRN Domenic Polite, MD      . buPROPion (WELLBUTRIN XL) 24 hr tablet 300 mg  300 mg Oral Daily Domenic Polite, MD      . ezetimibe (ZETIA) tablet 10 mg  10 mg Oral Daily Domenic Polite, MD      . HYDROmorphone (DILAUDID) injection 1 mg  1 mg Intravenous Q3H PRN Domenic Polite, MD   1 mg at 07/10/13 0818  . metoprolol succinate (TOPROL-XL) 24 hr tablet 50 mg  50 mg Oral Daily Domenic Polite, MD      . ondansetron [facility]) injection 4 mg  4 mg Intravenous Q6H PRN Domenic Polite, MD      . sertraline (ZOLOFT) tablet 100 mg  100 mg Oral Daily Domenic Polite, MD        Allergies as of 07/09/2013 - Review Complete 07/09/2013  Allergen Reaction Noted  . Ace inhibitors  04/12/2009  . Codeine Nausea And Vomiting 04/12/2009  . Statins  09/14/2009    Family History  Problem Relation Age of Onset  . Hypertension Mother   . Osteoporosis Mother   . Heart disease Father   . Osteoporosis Sister   . Breast cancer Paternal Aunt     Age 47's  . Diabetes Paternal Uncle     History   Social History  . Marital Status: Married    Spouse Name: N/A    Number of Children: N/A  . Years of Education: N/A   Occupational History  . Not on file.   Social History Main Topics  . Smoking status: Former Smoker -- 42 years    Types: Cigarettes    Quit date: 07/09/2008  . Smokeless tobacco: Never Used     Comment: quit 6 months ago  . Alcohol Use: 4.2 oz/week    7 Glasses of wine per week     Comment: 2 glasses of wine per day- 5 times per week  . Drug Use: No  . Sexual Activity: No   Other Topics Concern  . Not on file   Social History Narrative   Gets regular exercise          Review of Systems: Ten point ROS is O/W negative except as mentioned in HPI.  Physical Exam: Vital signs in last 24 hours: Temp:  [98.1 F  (36.7 C)-99.9 F (37.7 C)] 98.7 F (37.1 C) (04/23 0525) Pulse Rate:  [77-105] 77 (04/23 0525) Resp:  [16-23] 18 (04/23  0525) BP: (116-153)/(68-82) 130/78 mmHg (04/23 0525) SpO2:  [94 %-97 %] 97 % (04/23 0525) Weight:  [170 lb (77.111 kg)] 170 lb (77.111 kg) (04/22 1928) Last BM Date: 07/08/13 General:  Alert, Well-developed, well-nourished, pleasant and cooperative in NAD Head:  Normocephalic and atraumatic. Eyes:  Sclera clear, no icterus.  Conjunctiva pink. Ears:  Normal auditory acuity. Mouth:  No deformity or lesions.  Lungs:  Clear throughout to auscultation.  No wheezes, crackles, or rhonchi.  Heart:  Regular rate and rhythm; no murmurs, clicks, rubs, or gallops. Abdomen:  Soft, non-distended.  BS present.  Mild-moderate diffuse TTP > in the epigastrium without R/R/G.   Rectal:  Deferred  Msk:  Symmetrical without gross deformities. Pulses:  Normal pulses noted. Extremities:  Without clubbing or edema. Neurologic:  Alert and  oriented x4;  grossly normal neurologically. Skin:  Intact without significant lesions or rashes. Psych:  Alert and cooperative. Normal mood and affect.  Intake/Output from previous day: 04/22 0701 - 04/23 0700 In: 1050 [I.V.:1050] Out: -  Intake/Output this shift: Total I/O In: -  Out: 250 [Urine:250]  Lab Results:  Recent Labs  07/08/13 1507 07/09/13 1550 07/10/13 0421  WBC 5.6 4.4 3.8*  HGB 13.0 12.4 10.8*  HCT 39.8 38.7 33.4*  PLT 235.0 172 156   BMET  Recent Labs  07/08/13 1507 07/09/13 1550 07/10/13 0421  NA 139 139 142  K 3.6 3.7 4.5  CL 103 99 108  CO2 27 26 21   GLUCOSE 94 129* 81  BUN 29* 22 17  CREATININE 1.0 0.88 0.83  CALCIUM 9.7 10.0 8.5   LFT  Recent Labs  07/08/13 1507  07/10/13 0421  PROT 7.2  < > 5.7*  ALBUMIN 4.1  < > 3.1*  AST 100*  < > 97*  ALT 131*  < > 109*  ALKPHOS 88  < > 100  BILITOT 1.8*  < > 1.1  BILIDIR 0.8*  --   --   < > = values in this interval not displayed. PT/INR  Recent  Labs  07/10/13 0421  LABPROT 15.2  INR 1.23   Hepatitis Panel  Recent Labs  07/09/13 1357  HEPBSAG NEGATIVE  HCVAB NEGATIVE  HEPAIGM NON REACTIVE  HEPBIGM NON REACTIVE   Studies/Results: Dg Chest 2 View  07/09/2013   CLINICAL DATA:  Upper abdominal pain.  EXAM: CHEST  2 VIEW  COMPARISON:  PA and lateral chest 07/08/2012.  FINDINGS: Minimal linear atelectasis is seen in the left lung base. The lungs are otherwise clear. No pneumothorax or pleural effusion is identified. Heart size is normal.  IMPRESSION: No acute disease.   Electronically Signed   By: Inge Rise M.D.   On: 07/09/2013 16:44   US Abdomen Complete  07/09/2013   CLINICAL DATA:  Abdominal pain  EXAM: ULTRASOUND ABDOMEN COMPLETE  COMPARISON:  04/13/2010  FINDINGS: Gallbladder:  Multiple gallstones are noted. Mild wall thickening to 4 mm is seen. No pericholecystic fluid is noted. A negative sonographic Murphy's sign is elicited.  Common bile duct:  Diameter: 14 mm. This is greater than that expected for the patient's given age. It is also increased from the prior CT examination. This suggests a distal obstructive process. Correlation with laboratory values is recommended.  Liver:  No focal lesion identified. Within normal limits in parenchymal echogenicity.  IVC:  No abnormality visualized.  Pancreas:  Visualized portion unremarkable.  Spleen:  Size and appearance within normal limits.  Right Kidney:  Length: 10.1 cm. There is a  focus of increased echogenicity identified within the cortex of the mid right kidney. This likely represents a focus of calcification of uncertain significance. No obstructive changes are noted.  Left Kidney:  Length: 11.7 cm. Echogenicity within normal limits. No mass or hydronephrosis visualized.  Abdominal aorta:  No aneurysm visualized.  Other findings:  None.  IMPRESSION: Cholelithiasis .  Dilated common bile duct suggestive of an obstructive process. This may be related to a distal common bile duct  stone. Correlation with laboratory values is recommended.  Focus of calcification in the mid right kidney of uncertain significance. This does not appear to be related to a calculus.   Electronically Signed   By: Inez Catalina M.D.   On: 07/09/2013 16:43    IMPRESSION:  -Acute pancreatitis:  Likely gallstone pancreatitis with elevated LFT's and ultrasound showing cholelithiasis with dilated CBD suggestive of obstructive process.   PLAN: -Plan per Dr. Henrene Pastor, but will likely want an MRI/MRCP to document definite CBD stone.  Also, would want to hold off on any ERCP until acute pancreatitis has improved. -Continue IVF's at 125 cc/hr along with ice chips only, pain control and anti-emetics prn. -Will need eventual cholecystectomy once acute issue resolved as well.   Laban Emperor. Zehr  07/10/2013, 9:42 AM  Pager number [phone]  GI ATTENDING  History, labs, x-rays reviewed. Patient personally seen and examined. Husband in room. Agree with above H&P... The patient presents with gallstone pancreatitis. Mild. LFT's and abdominal pain both significantly better. Suspect that she passed CBC stone. As such, Recommend Lap chole with IOC when deemed appropriate by General Surgery. If retained CBC stone seen, then ERCP. Thanks.   Docia Chuck. Geri Seminole., M.D. [facility] Division of Gastroenterology

## 2013-07-10 NOTE — Consult Note (Signed)
Seen, agree with above.  Await resolution of pancreatitis.  Will recheck labs in AM.    If labs improved, will try to hold off on MRCP.

## 2013-07-11 ENCOUNTER — Encounter (HOSPITAL_COMMUNITY): Admission: EM | Disposition: A | Discharge status: Home / Self Care | Payer: Self-pay | Source: Home / Self Care

## 2013-07-11 LAB — CBC
HCT: 33.6 % — ABNORMAL LOW (ref 36.0–46.0)
Hemoglobin: 10.6 g/dL — ABNORMAL LOW (ref 12.0–15.0)
MCH: 27.8 pg (ref 26.0–34.0)
MCHC: 31.5 g/dL (ref 30.0–36.0)
MCV: 88.2 fL (ref 78.0–100.0)
Platelets: 229 10*3/uL (ref 150–400)
RBC: 3.81 MIL/uL — ABNORMAL LOW (ref 3.87–5.11)
RDW: 13.2 % (ref 11.5–15.5)
WBC: 7.3 10*3/uL (ref 4.0–10.5)

## 2013-07-11 LAB — LIPASE, BLOOD: Lipase: 35 U/L (ref 11–59)

## 2013-07-11 LAB — COMPREHENSIVE METABOLIC PANEL
ALT: 73 U/L — ABNORMAL HIGH (ref 0–35)
AST: 49 U/L — ABNORMAL HIGH (ref 0–37)
Albumin: 3.1 g/dL — ABNORMAL LOW (ref 3.5–5.2)
Alkaline Phosphatase: 96 U/L (ref 39–117)
BILIRUBIN TOTAL: 0.9 mg/dL (ref 0.3–1.2)
BUN: 15 mg/dL (ref 6–23)
CHLORIDE: 103 meq/L (ref 96–112)
CO2: 20 mEq/L (ref 19–32)
CREATININE: 0.69 mg/dL (ref 0.50–1.10)
Calcium: 8.6 mg/dL (ref 8.4–10.5)
GFR calc non Af Amer: 89 mL/min — ABNORMAL LOW (ref >90)
GLUCOSE: 44 mg/dL — AB (ref 70–99)
POTASSIUM: 4.1 meq/L (ref 3.7–5.3)
Sodium: 140 mEq/L (ref 137–147)
Total Protein: 6.1 g/dL (ref 6.0–8.3)

## 2013-07-11 LAB — GLUCOSE, CAPILLARY: GLUCOSE-CAPILLARY: 88 mg/dL (ref 70–99)

## 2013-07-11 SURGERY — COLONOSCOPY
Anesthesia: Moderate Sedation

## 2013-07-11 MED ORDER — PANTOPRAZOLE SODIUM 40 MG IV SOLR
40.0000 mg | Freq: Every day | INTRAVENOUS | Status: DC
  Administered 2013-07-11 – 2013-07-15 (×5): 40 mg via INTRAVENOUS
  Filled 2013-07-11 (×5): qty 40

## 2013-07-11 MED ORDER — CIPROFLOXACIN IN D5W 400 MG/200ML IV SOLN
400.0000 mg | INTRAVENOUS | Status: AC
  Administered 2013-07-12: 400 mg via INTRAVENOUS
  Filled 2013-07-11: qty 200

## 2013-07-11 MED ORDER — LIP MEDEX EX OINT
TOPICAL_OINTMENT | CUTANEOUS | Status: AC
  Filled 2013-07-11: qty 7

## 2013-07-11 MED ORDER — DEXTROSE 50 % IV SOLN
25.0000 mL | Freq: Once | INTRAVENOUS | Status: AC | PRN
  Administered 2013-07-11: 25 mL via INTRAVENOUS
  Filled 2013-07-11: qty 50

## 2013-07-11 MED ORDER — POTASSIUM CHLORIDE 2 MEQ/ML IV SOLN
INTRAVENOUS | Status: DC
  Administered 2013-07-11 (×2): via INTRAVENOUS
  Filled 2013-07-11 (×4): qty 1000

## 2013-07-11 NOTE — Progress Notes (Signed)
[facility] Progress Note  Subjective:  Feels ok today.  No nausea or vomiting.  Passing flatus.  Still with some pain in upper abdomen.  Objective:  Vital signs in last 24 hours: Temp:  [98.2 F (36.8 C)-98.8 F (37.1 C)] 98.6 F (37 C) (04/24 0528) Pulse Rate:  [75-78] 75 (04/24 0528) Resp:  [16-18] 16 (04/24 0528) BP: (123-149)/(66-80) 123/66 mmHg (04/24 0528) SpO2:  [93 %-99 %] 95 % (04/24 0528) Last BM Date: 07/08/13 General:  Alert, Well-developed, in NAD Heart:  Regular rate and rhythm; no murmurs Pulm:  CTAB.  No W/R/R. Abdomen:  Soft, non-distended. Normal bowel sounds.  Mild epigastric TTP without R/R/G.  Abdomen benign. Extremities:  Without edema. Neurologic:  Alert and  oriented x4;  grossly normal neurologically. Psych:  Alert and cooperative. Normal mood and affect.  Intake/Output from previous day: 04/23 0701 - 04/24 0700 In: 3000 [I.V.:3000] Out: 1340 [Urine:1340]  Lab Results:  Recent Labs  07/09/13 1550 07/10/13 0421 07/11/13 0405  WBC 4.4 3.8* 7.3  HGB 12.4 10.8* 10.6*  HCT 38.7 33.4* 33.6*  PLT 172 156 229   BMET  Recent Labs  07/09/13 1550 07/10/13 0421 07/11/13 0405  NA 139 142 140  K 3.7 4.5 4.1  CL 99 108 103  CO2 26 21 20   GLUCOSE 129* 81 44*  BUN 22 17 15   CREATININE 0.88 0.83 0.69  CALCIUM 10.0 8.5 8.6   LFT  Recent Labs  07/08/13 1507  07/11/13 0405  PROT 7.2  < > 6.1  ALBUMIN 4.1  < > 3.1*  AST 100*  < > 49*  ALT 131*  < > 73*  ALKPHOS 88  < > 96  BILITOT 1.8*  < > 0.9  BILIDIR 0.8*  --   --   < > = values in this interval not displayed. PT/INR  Recent Labs  07/10/13 0421  LABPROT 15.2  INR 1.23   Hepatitis Panel  Recent Labs  07/09/13 1357  HEPBSAG NEGATIVE  HCVAB NEGATIVE  HEPAIGM NON REACTIVE  HEPBIGM NON REACTIVE    Dg Chest 2 View  07/09/2013   CLINICAL DATA:  Upper abdominal pain.  EXAM: CHEST  2 VIEW  COMPARISON:  PA and lateral chest 07/08/2012.  FINDINGS: Minimal linear  atelectasis is seen in the left lung base. The lungs are otherwise clear. No pneumothorax or pleural effusion is identified. Heart size is normal.  IMPRESSION: No acute disease.   Electronically Signed   By: Inge Rise M.D.   On: 07/09/2013 16:44   US Abdomen Complete  07/09/2013   CLINICAL DATA:  Abdominal pain  EXAM: ULTRASOUND ABDOMEN COMPLETE  COMPARISON:  04/13/2010  FINDINGS: Gallbladder:  Multiple gallstones are noted. Mild wall thickening to 4 mm is seen. No pericholecystic fluid is noted. A negative sonographic Murphy's sign is elicited.  Common bile duct:  Diameter: 14 mm. This is greater than that expected for the patient's given age. It is also increased from the prior CT examination. This suggests a distal obstructive process. Correlation with laboratory values is recommended.  Liver:  No focal lesion identified. Within normal limits in parenchymal echogenicity.  IVC:  No abnormality visualized.  Pancreas:  Visualized portion unremarkable.  Spleen:  Size and appearance within normal limits.  Right Kidney:  Length: 10.1 cm. There is a focus of increased echogenicity identified within the cortex of the mid right kidney. This likely represents a focus of calcification of uncertain significance. No obstructive changes are noted.  Left  Kidney:  Length: 11.7 cm. Echogenicity within normal limits. No mass or hydronephrosis visualized.  Abdominal aorta:  No aneurysm visualized.  Other findings:  None.  IMPRESSION: Cholelithiasis .  Dilated common bile duct suggestive of an obstructive process. This may be related to a distal common bile duct stone. Correlation with laboratory values is recommended.  Focus of calcification in the mid right kidney of uncertain significance. This does not appear to be related to a calculus.   Electronically Signed   By: Inez Catalina M.D.   On: 07/09/2013 16:43    Assessment / Plan: -Acute pancreatitis: Gallstone pancreatitis with elevated LFT's and ultrasound showing  cholelithiasis with dilated CBD suggestive of obstructive process.  Lipase recovered quickly and is normal today.  LFT's also trending down with now normal bili.  Likely passed a stone.  *Plans for lap chole with IOC per surgery.  ERCP only needed if IOC positive for retained stone. *Otherwise supportive measures.    LOS: 2 days   Maria Schneider. Zehr  07/11/2013, 9:06 AM  Pager number [phone]   GI ATTENDING  Clinically and biochemically better. Agree with lap-chole with IOC as next step.  Docia Chuck. Geri Seminole., M.D. [facility] Division of Gastroenterology

## 2013-07-11 NOTE — Progress Notes (Signed)
CRITICAL VALUE ALERT  Critical value received:  Glucose  Date of notification:  04/24  Time of notification:  0503  Critical value read back:yes  Nurse who received alert:  MB  MD notified (1st page):  Wendee Beavers  Time of first page:  0506  MD notified (2nd page):  Time of second page:  Responding MD:  Wendee Beavers  Time MD responded:  [phone]

## 2013-07-11 NOTE — Progress Notes (Signed)
Patient ID: Maria Schneider, female   DOB: April 07, 1945, 68 y.o.   MRN: [id number]  Subjective: Pain is much better today.  LFTs are down, bili .9.  Lipase 35.    Objective:  Vital signs:  Filed Vitals:   07/10/13 1051 07/10/13 1342 07/10/13 2157 07/11/13 0528  BP: 138/80 148/77 149/78 123/66  Pulse: 78 77 77 75  Temp:  98.8 F (37.1 C) 98.2 F (36.8 C) 98.6 F (37 C)  TempSrc:  Oral Oral Oral  Resp:  '18 16 16  ' Height:      Weight:      SpO2:  99% 93% 95%    Last BM Date: 07/08/13  Intake/Output   Yesterday:  04/23 0701 - 04/24 0700 In: 3000 [I.V.:3000] Out: 1340 [Urine:1340] This shift:  Total I/O In: 0  Out: 150 [Urine:150]   Physical Exam: General: Pt awake/alert/oriented x4 in no acute distress Chest: cta. No chest wall pain w good excursion CV:  Pulses intact.  Regular rhythm MS: Normal AROM mjr joints.  No obvious deformity Abdomen: Soft.  Nondistended.  Mild ttp to LUQ.  No evidence of peritonitis.  No incarcerated hernias. Ext:  SCDs BLE.  No mjr edema.  No cyanosis Skin: No petechiae / purpura   Problem List:   Principal Problem:   Gallstone pancreatitis Active Problems:   HYPERTENSION   GERD   Gall stone pancreatitis    Results:   Labs: Results for orders placed during the hospital encounter of 07/09/13 (from the past 48 hour(s))  LIPASE, BLOOD     Status: Abnormal   Collection Time    07/09/13  3:50 PM      Result Value Ref Range   Lipase >3000 (*) 11 - 59 U/L   Comment: REPEATED TO VERIFY  COMPREHENSIVE METABOLIC PANEL     Status: Abnormal   Collection Time    07/09/13  3:50 PM      Result Value Ref Range   Sodium 139  137 - 147 mEq/L   Potassium 3.7  3.7 - 5.3 mEq/L   Chloride 99  96 - 112 mEq/L   CO2 26  19 - 32 mEq/L   Glucose, Bld 129 (*) 70 - 99 mg/dL   BUN 22  6 - 23 mg/dL   Creatinine, Ser 0.88  0.50 - 1.10 mg/dL   Calcium 10.0  8.4 - 10.5 mg/dL   Total Protein 7.2  6.0 - 8.3 g/dL   Albumin 4.0  3.5 - 5.2 g/dL   AST 173  (*) 0 - 37 U/L   ALT 152 (*) 0 - 35 U/L   Alkaline Phosphatase 126 (*) 39 - 117 U/L   Total Bilirubin 4.3 (*) 0.3 - 1.2 mg/dL   GFR calc non Af Amer 67 (*) >90 mL/min   GFR calc Af Amer 78 (*) >90 mL/min   Comment: (NOTE)     The eGFR has been calculated using the CKD EPI equation.     This calculation has not been validated in all clinical situations.     eGFR's persistently <90 mL/min signify possible Chronic Kidney     Disease.  CBC WITH DIFFERENTIAL     Status: Abnormal   Collection Time    07/09/13  3:50 PM      Result Value Ref Range   WBC 4.4  4.0 - 10.5 K/uL   RBC 4.56  3.87 - 5.11 MIL/uL   Hemoglobin 12.4  12.0 - 15.0 g/dL   HCT 38.7  36.0 - 46.0 %   MCV 84.9  78.0 - 100.0 fL   MCH 27.2  26.0 - 34.0 pg   MCHC 32.0  30.0 - 36.0 g/dL   RDW 13.4  11.5 - 15.5 %   Platelets 172  150 - 400 K/uL   Neutrophils Relative % 81 (*) 43 - 77 %   Neutro Abs 3.6  1.7 - 7.7 K/uL   Lymphocytes Relative 6 (*) 12 - 46 %   Lymphs Abs 0.3 (*) 0.7 - 4.0 K/uL   Monocytes Relative 12  3 - 12 %   Monocytes Absolute 0.5  0.1 - 1.0 K/uL   Eosinophils Relative 1  0 - 5 %   Eosinophils Absolute 0.0  0.0 - 0.7 K/uL   Basophils Relative 0  0 - 1 %   Basophils Absolute 0.0  0.0 - 0.1 K/uL  URINALYSIS, ROUTINE W REFLEX MICROSCOPIC     Status: Abnormal   Collection Time    07/09/13  3:56 PM      Result Value Ref Range   Color, Urine ORANGE (*) YELLOW   Comment: BIOCHEMICALS MAY BE AFFECTED BY COLOR   APPearance CLOUDY (*) CLEAR   Specific Gravity, Urine 1.015  1.005 - 1.030   pH 5.5  5.0 - 8.0   Glucose, UA NEGATIVE  NEGATIVE mg/dL   Hgb urine dipstick NEGATIVE  NEGATIVE   Bilirubin Urine LARGE (*) NEGATIVE   Ketones, ur NEGATIVE  NEGATIVE mg/dL   Protein, ur NEGATIVE  NEGATIVE mg/dL   Urobilinogen, UA 1.0  0.0 - 1.0 mg/dL   Nitrite NEGATIVE  NEGATIVE   Leukocytes, UA TRACE (*) NEGATIVE  URINE MICROSCOPIC-ADD ON     Status: Abnormal   Collection Time    07/09/13  3:56 PM      Result Value  Ref Range   Squamous Epithelial / LPF RARE  RARE   WBC, UA 0-2  <3 WBC/hpf   RBC / HPF 0-2  <3 RBC/hpf   Bacteria, UA FEW (*) RARE   Crystals CA OXALATE CRYSTALS (*) NEGATIVE   Urine-Other MUCOUS PRESENT    I-STAT TROPOININ, ED     Status: None   Collection Time    07/09/13  4:00 PM      Result Value Ref Range   Troponin i, poc 0.00  0.00 - 0.08 ng/mL   Comment 3            Comment: Due to the release kinetics of cTnI,     a negative result within the first hours     of the onset of symptoms does not rule out     myocardial infarction with certainty.     If myocardial infarction is still suspected,     repeat the test at appropriate intervals.  COMPREHENSIVE METABOLIC PANEL     Status: Abnormal   Collection Time    07/10/13  4:21 AM      Result Value Ref Range   Sodium 142  137 - 147 mEq/L   Potassium 4.5  3.7 - 5.3 mEq/L   Comment: REPEATED TO VERIFY     DELTA CHECK NOTED   Chloride 108  96 - 112 mEq/L   Comment: REPEATED TO VERIFY     DELTA CHECK NOTED   CO2 21  19 - 32 mEq/L   Glucose, Bld 81  70 - 99 mg/dL   BUN 17  6 - 23 mg/dL   Creatinine, Ser 0.83  0.50 - 1.10 mg/dL  Calcium 8.5  8.4 - 10.5 mg/dL   Total Protein 5.7 (*) 6.0 - 8.3 g/dL   Albumin 3.1 (*) 3.5 - 5.2 g/dL   AST 97 (*) 0 - 37 U/L   ALT 109 (*) 0 - 35 U/L   Alkaline Phosphatase 100  39 - 117 U/L   Total Bilirubin 1.1  0.3 - 1.2 mg/dL   Comment: REPEATED TO VERIFY     DELTA CHECK NOTED   GFR calc non Af Amer 72 (*) >90 mL/min   GFR calc Af Amer 84 (*) >90 mL/min   Comment: (NOTE)     The eGFR has been calculated using the CKD EPI equation.     This calculation has not been validated in all clinical situations.     eGFR's persistently <90 mL/min signify possible Chronic Kidney     Disease.  CBC     Status: Abnormal   Collection Time    07/10/13  4:21 AM      Result Value Ref Range   WBC 3.8 (*) 4.0 - 10.5 K/uL   RBC 3.87  3.87 - 5.11 MIL/uL   Hemoglobin 10.8 (*) 12.0 - 15.0 g/dL   HCT 33.4 (*)  36.0 - 46.0 %   MCV 86.3  78.0 - 100.0 fL   MCH 27.9  26.0 - 34.0 pg   MCHC 32.3  30.0 - 36.0 g/dL   RDW 13.8  11.5 - 15.5 %   Platelets 156  150 - 400 K/uL  PROTIME-INR     Status: None   Collection Time    07/10/13  4:21 AM      Result Value Ref Range   Prothrombin Time 15.2  11.6 - 15.2 seconds   INR 1.23  0.00 - 1.49  LIPASE, BLOOD     Status: None   Collection Time    07/11/13  4:05 AM      Result Value Ref Range   Lipase 35  11 - 59 U/L  COMPREHENSIVE METABOLIC PANEL     Status: Abnormal   Collection Time    07/11/13  4:05 AM      Result Value Ref Range   Sodium 140  137 - 147 mEq/L   Potassium 4.1  3.7 - 5.3 mEq/L   Chloride 103  96 - 112 mEq/L   CO2 20  19 - 32 mEq/L   Glucose, Bld 44 (*) 70 - 99 mg/dL   Comment: CRITICAL RESULT CALLED TO, READ BACK BY AND VERIFIED WITH:     MBLOUNT RN AT 0500 ON [id number] BY DLONG   BUN 15  6 - 23 mg/dL   Creatinine, Ser 0.69  0.50 - 1.10 mg/dL   Calcium 8.6  8.4 - 10.5 mg/dL   Total Protein 6.1  6.0 - 8.3 g/dL   Albumin 3.1 (*) 3.5 - 5.2 g/dL   AST 49 (*) 0 - 37 U/L   Comment: SLIGHT HEMOLYSIS     HEMOLYSIS AT THIS LEVEL MAY AFFECT RESULT   ALT 73 (*) 0 - 35 U/L   Alkaline Phosphatase 96  39 - 117 U/L   Total Bilirubin 0.9  0.3 - 1.2 mg/dL   GFR calc non Af Amer 89 (*) >90 mL/min   GFR calc Af Amer >90  >90 mL/min   Comment: (NOTE)     The eGFR has been calculated using the CKD EPI equation.     This calculation has not been validated in all clinical situations.  eGFR's persistently <90 mL/min signify possible Chronic Kidney     Disease.  CBC     Status: Abnormal   Collection Time    07/11/13  4:05 AM      Result Value Ref Range   WBC 7.3  4.0 - 10.5 K/uL   RBC 3.81 (*) 3.87 - 5.11 MIL/uL   Hemoglobin 10.6 (*) 12.0 - 15.0 g/dL   HCT 33.6 (*) 36.0 - 46.0 %   MCV 88.2  78.0 - 100.0 fL   MCH 27.8  26.0 - 34.0 pg   MCHC 31.5  30.0 - 36.0 g/dL   RDW 13.2  11.5 - 15.5 %   Platelets 229  150 - 400 K/uL   Comment:  REPEATED TO VERIFY     DELTA CHECK NOTED  GLUCOSE, CAPILLARY     Status: None   Collection Time    07/11/13  5:47 AM      Result Value Ref Range   Glucose-Capillary 88  70 - 99 mg/dL    Imaging / Studies: Dg Chest 2 View  07/09/2013   CLINICAL DATA:  Upper abdominal pain.  EXAM: CHEST  2 VIEW  COMPARISON:  PA and lateral chest 07/08/2012.  FINDINGS: Minimal linear atelectasis is seen in the left lung base. The lungs are otherwise clear. No pneumothorax or pleural effusion is identified. Heart size is normal.  IMPRESSION: No acute disease.   Electronically Signed   By: Inge Rise M.D.   On: 07/09/2013 16:44   US Abdomen Complete  07/09/2013   CLINICAL DATA:  Abdominal pain  EXAM: ULTRASOUND ABDOMEN COMPLETE  COMPARISON:  04/13/2010  FINDINGS: Gallbladder:  Multiple gallstones are noted. Mild wall thickening to 4 mm is seen. No pericholecystic fluid is noted. A negative sonographic Murphy's sign is elicited.  Common bile duct:  Diameter: 14 mm. This is greater than that expected for the patient's given age. It is also increased from the prior CT examination. This suggests a distal obstructive process. Correlation with laboratory values is recommended.  Liver:  No focal lesion identified. Within normal limits in parenchymal echogenicity.  IVC:  No abnormality visualized.  Pancreas:  Visualized portion unremarkable.  Spleen:  Size and appearance within normal limits.  Right Kidney:  Length: 10.1 cm. There is a focus of increased echogenicity identified within the cortex of the mid right kidney. This likely represents a focus of calcification of uncertain significance. No obstructive changes are noted.  Left Kidney:  Length: 11.7 cm. Echogenicity within normal limits. No mass or hydronephrosis visualized.  Abdominal aorta:  No aneurysm visualized.  Other findings:  None.  IMPRESSION: Cholelithiasis .  Dilated common bile duct suggestive of an obstructive process. This may be related to a distal common  bile duct stone. Correlation with laboratory values is recommended.  Focus of calcification in the mid right kidney of uncertain significance. This does not appear to be related to a calculus.   Electronically Signed   By: Inez Catalina M.D.   On: 07/09/2013 16:43    Medications / Allergies: per chart  Antibiotics: Anti-infectives   Start     Dose/Rate Route Frequency Ordered Stop   07/12/13 0600  ciprofloxacin (CIPRO) IVPB 400 mg    Comments:  Pharmacy may adjust dosing strength, interval, or rate of medication as needed for optimal therapy for the patient Send with patient on call to the OR.  Anesthesia to complete antibiotic administration <56mn [facility]   400 mg 200  mL/hr over 60 Minutes Intravenous On call to O.R. 07/11/13 1055 07/13/13 0559      Assessment/Plan Cholelithiasis Gallstone pancreatitis  -proceed with a laparoscopic cholecystectomy in AM -cipro on call to or -obtain consent -NPO  -IVF -pain control  Erby Pian, [facility] Surgery Pager [phone] Office [phone]  07/11/2013 10:58 AM

## 2013-07-11 NOTE — Progress Notes (Signed)
Hypoglycemic Event  CBG: 44  Treatment: D50 IV 25 mL  Symptoms: None  Follow-up CBG: Time:0545 CBG Result:88  Possible Reasons for Event: Inadequate meal intake  Comments/MD notified:Vega    Maria Schneider Maria Schneider  Remember to initiate Hypoglycemia Order Set & complete

## 2013-07-11 NOTE — Progress Notes (Signed)
Husband at home.  He will be here tomorrow.  I warned her that at this time, she is our 4th gall bladder operation scheduled.  Came in with pancreatitis - lipase >3000 on 07/09/2013 and down to 35 on 07/11/2013  [place] - 07/09/2013 - shows dilated CBD and cholelithiasis.     I discussed with the patient the indications and risks of gall bladder surgery.  The primary risks of gall bladder surgery include, but are not limited to, bleeding, infection, common bile duct injury, and open surgery.  There is also the risk that the patient may have continued symptoms after surgery.  However, the likelihood of improvement in symptoms and return to the patient's normal status is good. We discussed the typical post-operative recovery course. I tried to answer the patient's questions.  I gave the patient literature about gall bladder surgery.  Alphonsa Overall, MD, [facility] Pager: [phone] Office phone:  [phone]

## 2013-07-11 NOTE — Progress Notes (Addendum)
[facility] PROGRESS NOTE  Maria Schneider JKD:[id number] DOB: [birth year] DOA: 07/09/2013 PCP: Laurey Morale, MD  Assessment/Plan: Acute Gallstone pancreatitis /cholelithiasis -Improving, bili down, LFTS better -suspect passed stone -IVF, supportive care, anti-emetics, pain control  -advance to clears, NPO after MN for Lap chole -plan for IOC to determine need for ERCP -check Cmet in am  HYPERTENSION  -stable, continue Toprol   GERD  -continue PPI   Depression  -continue home meds   DVT proph: lovenox  Code Status: Full COde  Family Communication: none at bedside  Disposition Plan: inpatient  Consultants: [facility] [place]  HPI/Subjective: abd pain improving  Objective: Filed Vitals:   07/11/13 0528  BP: 123/66  Pulse: 75  Temp: 98.6 F (37 C)  Resp: 16    Intake/Output Summary (Last 24 hours) at 07/11/13 1114 Last data filed at 07/11/13 0934  Gross per 24 hour  Intake   2500 ml  Output   1000 ml  Net   1500 ml   Filed Weights   07/09/13 1928  Weight: 77.111 kg (170 lb)    Exam:   General:  AAOx3  HEENT: mild scleral icterus  Cardiovascular: S1S2/RRR  Respiratory: CTAB  Abdomen: soft, Mild epigastric tenderness, BS diminished  Musculoskeletal: no edema c/c  Data Reviewed: Basic Metabolic Panel:  Recent Labs Lab 07/08/13 1507 07/09/13 1550 07/10/13 0421 07/11/13 0405  NA 139 139 142 140  K 3.6 3.7 4.5 4.1  CL 103 99 108 103  CO2 27 26 21 20   GLUCOSE 94 129* 81 44*  BUN 29* 22 17 15   CREATININE 1.0 0.88 0.83 0.69  CALCIUM 9.7 10.0 8.5 8.6   Liver Function Tests:  Recent Labs Lab 07/08/13 1507 07/09/13 1550 07/10/13 0421 07/11/13 0405  AST 100* 173* 97* 49*  ALT 131* 152* 109* 73*  ALKPHOS 88 126* 100 96  BILITOT 1.8* 4.3* 1.1 0.9  PROT 7.2 7.2 5.7* 6.1  ALBUMIN 4.1 4.0 3.1* 3.1*    Recent Labs Lab 07/08/13 1507 07/09/13 1550 07/11/13 0405  LIPASE 389.0* >3000* 35  AMYLASE 575*  --   --    No results found for  this basename: AMMONIA,  in the last 168 hours CBC:  Recent Labs Lab 07/08/13 1507 07/09/13 1550 07/10/13 0421 07/11/13 0405  WBC 5.6 4.4 3.8* 7.3  NEUTROABS 4.2 3.6  --   --   HGB 13.0 12.4 10.8* 10.6*  HCT 39.8 38.7 33.4* 33.6*  MCV 84.9 84.9 86.3 88.2  PLT 235.0 172 156 229   Cardiac Enzymes: No results found for this basename: CKTOTAL, CKMB, CKMBINDEX, TROPONINI,  in the last 168 hours BNP (last 3 results) No results found for this basename: PROBNP,  in the last 8760 hours CBG:  Recent Labs Lab 07/11/13 0547  GLUCAP 88    Recent Results (from the past 240 hour(s))  URINE CULTURE     Status: None   Collection Time    07/01/13  3:19 PM      Result Value Ref Range Status   Colony Count NO GROWTH   Final   Organism ID, Bacteria NO GROWTH   Final     Studies: Dg Chest 2 View  07/09/2013   CLINICAL DATA:  Upper abdominal pain.  EXAM: CHEST  2 VIEW  COMPARISON:  PA and lateral chest 07/08/2012.  FINDINGS: Minimal linear atelectasis is seen in the left lung base. The lungs are otherwise clear. No pneumothorax or pleural effusion is identified. Heart size is normal.  IMPRESSION:  No acute disease.   Electronically Signed   By: Inge Rise M.D.   On: 07/09/2013 16:44   US Abdomen Complete  07/09/2013   CLINICAL DATA:  Abdominal pain  EXAM: ULTRASOUND ABDOMEN COMPLETE  COMPARISON:  04/13/2010  FINDINGS: Gallbladder:  Multiple gallstones are noted. Mild wall thickening to 4 mm is seen. No pericholecystic fluid is noted. A negative sonographic Murphy's sign is elicited.  Common bile duct:  Diameter: 14 mm. This is greater than that expected for the patient's given age. It is also increased from the prior CT examination. This suggests a distal obstructive process. Correlation with laboratory values is recommended.  Liver:  No focal lesion identified. Within normal limits in parenchymal echogenicity.  IVC:  No abnormality visualized.  Pancreas:  Visualized portion unremarkable.   Spleen:  Size and appearance within normal limits.  Right Kidney:  Length: 10.1 cm. There is a focus of increased echogenicity identified within the cortex of the mid right kidney. This likely represents a focus of calcification of uncertain significance. No obstructive changes are noted.  Left Kidney:  Length: 11.7 cm. Echogenicity within normal limits. No mass or hydronephrosis visualized.  Abdominal aorta:  No aneurysm visualized.  Other findings:  None.  IMPRESSION: Cholelithiasis .  Dilated common bile duct suggestive of an obstructive process. This may be related to a distal common bile duct stone. Correlation with laboratory values is recommended.  Focus of calcification in the mid right kidney of uncertain significance. This does not appear to be related to a calculus.   Electronically Signed   By: Inez Catalina M.D.   On: 07/09/2013 16:43    Scheduled Meds: . buPROPion  300 mg Oral Daily  . [START ON 07/12/2013] ciprofloxacin  400 mg Intravenous On Call to OR  . enoxaparin (LOVENOX) injection  40 mg Subcutaneous Q24H  . ezetimibe  10 mg Oral Daily  . metoprolol succinate  50 mg Oral Daily  . sertraline  100 mg Oral Daily   Continuous Infusions: . sodium chloride 125 mL/hr at 07/11/13 0600   Antibiotics Given (last 72 hours)   None      Principal Problem:   Gallstone pancreatitis Active Problems:   HYPERTENSION   GERD   Gall stone pancreatitis    Time spent: 93min    [facility] Pager [phone]. If 7PM-7AM, please contact night-coverage at [email or web address], password [email or web address] 07/11/2013, 11:14 AM  LOS: 2 days

## 2013-07-11 NOTE — Progress Notes (Signed)
Seen, agree with above.    Gallstone pancreatitis.  GB soon.

## 2013-07-12 ENCOUNTER — Encounter (HOSPITAL_COMMUNITY): Payer: Self-pay | Admitting: Anesthesiology

## 2013-07-12 ENCOUNTER — Encounter (HOSPITAL_COMMUNITY): Admission: EM | Disposition: A | Discharge status: Home / Self Care | Payer: Self-pay | Source: Home / Self Care

## 2013-07-12 ENCOUNTER — Inpatient Hospital Stay (HOSPITAL_COMMUNITY): Payer: Medicare Other | Admitting: Anesthesiology

## 2013-07-12 ENCOUNTER — Encounter (HOSPITAL_COMMUNITY): Payer: Medicare Other | Admitting: Anesthesiology

## 2013-07-12 ENCOUNTER — Inpatient Hospital Stay (HOSPITAL_COMMUNITY): Payer: Medicare Other

## 2013-07-12 DIAGNOSIS — K8010 Calculus of gallbladder with chronic cholecystitis without obstruction: Secondary | ICD-10-CM

## 2013-07-12 HISTORY — PX: CHOLECYSTECTOMY: SHX55

## 2013-07-12 LAB — SURGICAL PCR SCREEN
MRSA, PCR: NEGATIVE
Staphylococcus aureus: NEGATIVE

## 2013-07-12 SURGERY — LAPAROSCOPIC CHOLECYSTECTOMY WITH INTRAOPERATIVE CHOLANGIOGRAM
Anesthesia: General | Site: Abdomen

## 2013-07-12 MED ORDER — OXYCODONE-ACETAMINOPHEN 5-325 MG PO TABS
1.0000 | ORAL_TABLET | ORAL | Status: DC | PRN
  Administered 2013-07-12: 2 via ORAL
  Administered 2013-07-13 (×2): 1 via ORAL
  Filled 2013-07-12 (×2): qty 1
  Filled 2013-07-12: qty 2

## 2013-07-12 MED ORDER — ROCURONIUM BROMIDE 100 MG/10ML IV SOLN
INTRAVENOUS | Status: DC | PRN
  Administered 2013-07-12: 30 mg via INTRAVENOUS

## 2013-07-12 MED ORDER — FENTANYL CITRATE 0.05 MG/ML IJ SOLN
INTRAMUSCULAR | Status: AC
  Filled 2013-07-12: qty 2

## 2013-07-12 MED ORDER — METOPROLOL TARTRATE 1 MG/ML IV SOLN
INTRAVENOUS | Status: AC
  Filled 2013-07-12: qty 5

## 2013-07-12 MED ORDER — PROPOFOL 10 MG/ML IV BOLUS
INTRAVENOUS | Status: DC | PRN
  Administered 2013-07-12: 140 mg via INTRAVENOUS

## 2013-07-12 MED ORDER — FENTANYL CITRATE 0.05 MG/ML IJ SOLN
INTRAMUSCULAR | Status: AC
  Filled 2013-07-12: qty 5

## 2013-07-12 MED ORDER — DEXAMETHASONE SODIUM PHOSPHATE 10 MG/ML IJ SOLN
INTRAMUSCULAR | Status: AC
  Filled 2013-07-12: qty 1

## 2013-07-12 MED ORDER — CISATRACURIUM BESYLATE 20 MG/10ML IV SOLN
INTRAVENOUS | Status: AC
  Filled 2013-07-12: qty 10

## 2013-07-12 MED ORDER — SUCCINYLCHOLINE CHLORIDE 20 MG/ML IJ SOLN
INTRAMUSCULAR | Status: DC | PRN
  Administered 2013-07-12: 100 mg via INTRAVENOUS

## 2013-07-12 MED ORDER — PROPOFOL 10 MG/ML IV BOLUS
INTRAVENOUS | Status: AC
  Filled 2013-07-12: qty 20

## 2013-07-12 MED ORDER — ONDANSETRON HCL 4 MG/2ML IJ SOLN
INTRAMUSCULAR | Status: AC
  Filled 2013-07-12: qty 2

## 2013-07-12 MED ORDER — HYDROMORPHONE HCL PF 1 MG/ML IJ SOLN
INTRAMUSCULAR | Status: AC
Start: 2013-07-12 — End: 2013-07-12
  Administered 2013-07-12: 1 mg via INTRAVENOUS
  Filled 2013-07-12: qty 1

## 2013-07-12 MED ORDER — HYDROMORPHONE HCL PF 1 MG/ML IJ SOLN
0.5000 mg | INTRAMUSCULAR | Status: DC | PRN
  Administered 2013-07-12: 0.5 mg via INTRAVENOUS

## 2013-07-12 MED ORDER — KCL IN DEXTROSE-NACL 20-5-0.45 MEQ/L-%-% IV SOLN
INTRAVENOUS | Status: DC
  Administered 2013-07-12 – 2013-07-15 (×5): via INTRAVENOUS
  Filled 2013-07-12 (×10): qty 1000

## 2013-07-12 MED ORDER — LACTATED RINGERS IV SOLN
INTRAVENOUS | Status: DC | PRN
  Administered 2013-07-12: 500 mL via INTRAVENOUS

## 2013-07-12 MED ORDER — CIPROFLOXACIN IN D5W 400 MG/200ML IV SOLN
INTRAVENOUS | Status: AC
  Filled 2013-07-12: qty 200

## 2013-07-12 MED ORDER — ROCURONIUM BROMIDE 100 MG/10ML IV SOLN
INTRAVENOUS | Status: AC
  Filled 2013-07-12: qty 1

## 2013-07-12 MED ORDER — NEOSTIGMINE METHYLSULFATE 1 MG/ML IJ SOLN
INTRAMUSCULAR | Status: AC
  Filled 2013-07-12: qty 10

## 2013-07-12 MED ORDER — GLYCOPYRROLATE 0.2 MG/ML IJ SOLN
INTRAMUSCULAR | Status: AC
  Filled 2013-07-12: qty 3

## 2013-07-12 MED ORDER — GLYCOPYRROLATE 0.2 MG/ML IJ SOLN
INTRAMUSCULAR | Status: DC | PRN
  Administered 2013-07-12: 0.6 mg via INTRAVENOUS

## 2013-07-12 MED ORDER — FENTANYL CITRATE 0.05 MG/ML IJ SOLN
INTRAMUSCULAR | Status: DC | PRN
  Administered 2013-07-12 (×3): 50 ug via INTRAVENOUS

## 2013-07-12 MED ORDER — MIDAZOLAM HCL 5 MG/5ML IJ SOLN
INTRAMUSCULAR | Status: DC | PRN
  Administered 2013-07-12: 1 mg via INTRAVENOUS

## 2013-07-12 MED ORDER — IOHEXOL 300 MG/ML  SOLN
INTRAMUSCULAR | Status: DC | PRN
  Administered 2013-07-12: 8 mL via INTRAVENOUS

## 2013-07-12 MED ORDER — BUPIVACAINE-EPINEPHRINE 0.25% -1:200000 IJ SOLN
INTRAMUSCULAR | Status: DC | PRN
  Administered 2013-07-12: 20 mL

## 2013-07-12 MED ORDER — DEXAMETHASONE SODIUM PHOSPHATE 4 MG/ML IJ SOLN
INTRAMUSCULAR | Status: DC | PRN
  Administered 2013-07-12: 10 mg via INTRAVENOUS

## 2013-07-12 MED ORDER — ONDANSETRON HCL 4 MG/2ML IJ SOLN
INTRAMUSCULAR | Status: DC | PRN
  Administered 2013-07-12: 4 mg via INTRAVENOUS

## 2013-07-12 MED ORDER — MEPERIDINE HCL 50 MG/ML IJ SOLN: 6.2500 mg | INTRAMUSCULAR | Status: DC | PRN

## 2013-07-12 MED ORDER — METOCLOPRAMIDE HCL 5 MG/ML IJ SOLN
INTRAMUSCULAR | Status: AC
  Filled 2013-07-12: qty 2

## 2013-07-12 MED ORDER — METOCLOPRAMIDE HCL 5 MG/ML IJ SOLN
INTRAMUSCULAR | Status: DC | PRN
  Administered 2013-07-12: 10 mg via INTRAVENOUS

## 2013-07-12 MED ORDER — MIDAZOLAM HCL 2 MG/2ML IJ SOLN
INTRAMUSCULAR | Status: AC
  Filled 2013-07-12: qty 2

## 2013-07-12 MED ORDER — FENTANYL CITRATE 0.05 MG/ML IJ SOLN
25.0000 ug | INTRAMUSCULAR | Status: DC | PRN
  Administered 2013-07-12 (×3): 50 ug via INTRAVENOUS

## 2013-07-12 MED ORDER — LIDOCAINE HCL (CARDIAC) 20 MG/ML IV SOLN
INTRAVENOUS | Status: AC
  Filled 2013-07-12: qty 5

## 2013-07-12 MED ORDER — LACTATED RINGERS IV SOLN
INTRAVENOUS | Status: DC | PRN
  Administered 2013-07-12 (×2): via INTRAVENOUS

## 2013-07-12 MED ORDER — BUPIVACAINE-EPINEPHRINE (PF) 0.25% -1:200000 IJ SOLN
INTRAMUSCULAR | Status: AC
  Filled 2013-07-12: qty 30

## 2013-07-12 MED ORDER — METOPROLOL TARTRATE 1 MG/ML IV SOLN
INTRAVENOUS | Status: DC | PRN
  Administered 2013-07-12: 2 mg via INTRAVENOUS

## 2013-07-12 MED ORDER — METOCLOPRAMIDE HCL 5 MG/ML IJ SOLN: 10.0000 mg | Freq: Once | INTRAMUSCULAR | Status: DC | PRN

## 2013-07-12 MED ORDER — NEOSTIGMINE METHYLSULFATE 1 MG/ML IJ SOLN
INTRAMUSCULAR | Status: DC | PRN
  Administered 2013-07-12: 5 mg via INTRAVENOUS

## 2013-07-12 SURGICAL SUPPLY — 39 items
ADH SKN CLS APL DERMABOND .7 (GAUZE/BANDAGES/DRESSINGS) ×1
APPLIER CLIP ROT 10 11.4 M/L (STAPLE) ×2
APR CLP MED LRG 11.4X10 (STAPLE) ×1
BAG SPEC RTRVL LRG 6X4 10 (ENDOMECHANICALS)
CANISTER SUCTION 2500CC (MISCELLANEOUS) ×2 IMPLANT
CATH REDDICK CHOLANGI 4FR 50CM (CATHETERS) IMPLANT
CHLORAPREP W/TINT 26ML (MISCELLANEOUS) ×2 IMPLANT
CLIP APPLIE ROT 10 11.4 M/L (STAPLE) ×1 IMPLANT
COVER MAYO STAND STRL (DRAPES) ×2 IMPLANT
DECANTER SPIKE VIAL GLASS SM (MISCELLANEOUS) ×2 IMPLANT
DERMABOND ADVANCED (GAUZE/BANDAGES/DRESSINGS) ×1
DERMABOND ADVANCED .7 DNX12 (GAUZE/BANDAGES/DRESSINGS) ×1 IMPLANT
DRAPE C-ARM 42X120 X-RAY (DRAPES) ×2 IMPLANT
DRAPE LAPAROSCOPIC ABDOMINAL (DRAPES) ×2 IMPLANT
DRAPE UTILITY XL STRL (DRAPES) ×2 IMPLANT
ELECT REM PT RETURN 9FT ADLT (ELECTROSURGICAL) ×2
ELECTRODE REM PT RTRN 9FT ADLT (ELECTROSURGICAL) ×1 IMPLANT
GLOVE BIOGEL PI IND STRL 7.5 (GLOVE) ×1 IMPLANT
GLOVE BIOGEL PI INDICATOR 7.5 (GLOVE) ×1
GLOVE SS BIOGEL STRL SZ 7.5 (GLOVE) ×1 IMPLANT
GLOVE SUPERSENSE BIOGEL SZ 7.5 (GLOVE) ×1
GOWN STRL REUS W/TWL XL LVL3 (GOWN DISPOSABLE) ×4 IMPLANT
HEMOSTAT SNOW SURGICEL 2X4 (HEMOSTASIS) IMPLANT
KIT BASIN OR (CUSTOM PROCEDURE TRAY) ×2 IMPLANT
NS IRRIG 1000ML POUR BTL (IV SOLUTION) IMPLANT
POUCH SPECIMEN RETRIEVAL 10MM (ENDOMECHANICALS) IMPLANT
SCISSORS LAP 5X35 DISP (ENDOMECHANICALS) ×2 IMPLANT
SET CHOLANGIOGRAPH MIX (MISCELLANEOUS) ×2 IMPLANT
SET IRRIG TUBING LAPAROSCOPIC (IRRIGATION / IRRIGATOR) ×2 IMPLANT
SLEEVE XCEL OPT CAN 5 100 (ENDOMECHANICALS) ×2 IMPLANT
SOLUTION ANTI FOG 6CC (MISCELLANEOUS) ×2 IMPLANT
SUT MNCRL AB 4-0 PS2 18 (SUTURE) ×2 IMPLANT
TOWEL OR 17X26 10 PK STRL BLUE (TOWEL DISPOSABLE) ×2 IMPLANT
TOWEL OR NON WOVEN STRL DISP B (DISPOSABLE) ×2 IMPLANT
TRAY LAP CHOLE (CUSTOM PROCEDURE TRAY) ×2 IMPLANT
TROCAR BLADELESS OPT 5 100 (ENDOMECHANICALS) ×2 IMPLANT
TROCAR XCEL BLUNT TIP 100MML (ENDOMECHANICALS) ×2 IMPLANT
TROCAR XCEL NON-BLD 11X100MML (ENDOMECHANICALS) ×2 IMPLANT
TUBING INSUFFLATION 10FT LAP (TUBING) ×2 IMPLANT

## 2013-07-12 NOTE — Anesthesia Preprocedure Evaluation (Signed)
Anesthesia Evaluation  Patient identified by MRN, date of birth, ID band Patient awake    Reviewed: Allergy & Precautions, H&P , NPO status , Patient's Chart, lab work & pertinent test results, reviewed documented beta blocker date and time   Airway Mallampati: I TM Distance: >3 FB Neck ROM: Full    Dental no notable dental hx. (+) Partial Upper   Pulmonary former smoker,  breath sounds clear to auscultation  Pulmonary exam normal       Cardiovascular hypertension, Pt. on medications and Pt. on home beta blockers Rhythm:Regular Rate:Normal     Neuro/Psych PSYCHIATRIC DISORDERS Depression negative neurological ROS     GI/Hepatic Neg liver ROS, GERD-  Medicated and Controlled,  Endo/Other  negative endocrine ROS  Renal/GU Renal diseaseHx/o Pyelonephritis  negative genitourinary   Musculoskeletal negative musculoskeletal ROS (+)   Abdominal (+)  Abdomen: tender.    Peds  Hematology negative hematology ROS (+)   Anesthesia Other Findings   Reproductive/Obstetrics negative OB ROS                           Anesthesia Physical Anesthesia Plan  ASA: II  Anesthesia Plan: General   Post-op Pain Management:    Induction: Intravenous  Airway Management Planned: Oral ETT  Additional Equipment:   Intra-op Plan:   Post-operative Plan: Extubation in OR  Informed Consent: I have reviewed the patients History and Physical, chart, labs and discussed the procedure including the risks, benefits and alternatives for the proposed anesthesia with the patient or authorized representative who has indicated his/her understanding and acceptance.   Dental advisory given  Plan Discussed with: CRNA, Anesthesiologist and Surgeon  Anesthesia Plan Comments:         Anesthesia Quick Evaluation

## 2013-07-12 NOTE — Op Note (Signed)
Preoperative Diagnosis: gallstone pancreatitis  Postoprative Diagnosis: Gallstone pancreatitis and choledocholithiasis  Procedure: Procedure(s): LAPAROSCOPIC CHOLECYSTECTOMY WITH INTRAOPERATIVE CHOLANGIOGRAM   Surgeon: Edward Jolly   Assistants: Remo Lipps gross  Anesthesia:  General endotracheal anesthesia  Indications: patient is a 68 year old female who presented with acute severe upper above of pain. Workup showed evidence of acute pancreatitis and gallbladder ultrasound has shown multiple gallstones. Her LFTs and lipase quickly normalized with marked improvement in her symptoms. We have recommended proceeding with laparoscopic cholecystectomy with cholangiogram. The indications for the procedure and its nature and recovery and risks have been discussed and detailed extensively elsewhere.  Procedure Detail: patient was brought to the operating room, placed in the supine position on the operating table, and general endotracheal anesthesia induced. She received preoperative IV antibiotics. PAS were in place. The abdomen was widely sterilely prepped and draped. Patient timeout was performed and correct procedure verified. Trocar sites were infiltrated with local anesthesia. A 1 cm incision was created the umbilicus and dissection carried down to the midline fascia which was incised for 1 cm and the peritoneum entered under direct vision.  Through a mattress suture of 0 Vicryl the subcutaneous trochars placed and pneumoperitoneum established. Under direct vision an 11 mm trocar was placed subxiphoid and 25 mm trochars along the right subcostal margin. There were chronic omental adhesions to the gallbladder that were taken down with cautery and blunt dissection. The gallbladder was somewhat distended and edematous but not acutely inflamed. The fundus was elevated up over the liver and the infundibulum retracted inferolaterally. The fibrofatty tissue was taken off the neck of the gallbladder toward  the porta hepatus. Peritoneum anterior and posterior the closed triangle was incised and the distal gallbladder and close triangle thoroughly dissected. A good critical view was obtained with the cystic duct and cystic artery isolated and the cholecystic plate in view. When the anatomy was clear the cystic artery was singly clipped and the cystic duct was clipped at the gallbladder junction and operative cholangiograms obtained through the cystic duct. This showed a normal size common bile duct and intrahepatic ducts with good flow into the duodenum but several small filling defects in the distal common bile duct consistent with small stones. The patient had a very long tortuous cystic duct I felt it was best to leave the stones for observation for possible postoperative ERCP. The cystic duct was triply clipped proximally and divided. The cystic artery was further clipped distally and divided. The gallbladder was then dissected free from its bed using hook cautery and placed in the Endo Catch bag and removed through the umbilical site. The right upper quadrant was thoroughly irrigated and hemostasis assured. There was no evidence of bleeding or trocar injury or other problems. All CO2 was evacuated the trochars removed. The mattress suture was secured at the umbilicus. Skin incisions were closed with subcuticular Monocryl and Dermabond.    Findings: Edematous gallbladder with chronic cholecystitis and apparent small distal common bile duct stones  Estimated Blood Loss:  Minimal         Drains: none  Blood Given: none          Specimens: gallbladder and contents        Complications:  * No complications entered in OR log *         Disposition: PACU - hemodynamically stable.         Condition: stable

## 2013-07-12 NOTE — Anesthesia Postprocedure Evaluation (Signed)
  Anesthesia Post-op Note  Patient: Maria Schneider  Procedure(s) Performed: Procedure(s): LAPAROSCOPIC CHOLECYSTECTOMY WITH INTRAOPERATIVE CHOLANGIOGRAM (N/A)  Patient Location: PACU  Anesthesia Type:General  Level of Consciousness: awake, alert  and oriented  Airway and Oxygen Therapy: Patient Spontanous Breathing  Post-op Pain: mild  Post-op Assessment: Post-op Vital signs reviewed, Patient's Cardiovascular Status Stable, Respiratory Function Stable, Patent Airway, No signs of Nausea or vomiting and Pain level controlled  Post-op Vital Signs: Reviewed and stable  Last Vitals:  Filed Vitals:   07/12/13 1630  BP: 131/52  Pulse: 72  Temp: 36.7 C  Resp: 11    Complications: No apparent anesthesia complications

## 2013-07-12 NOTE — Transfer of Care (Signed)
Immediate Anesthesia Transfer of Care Note  Patient: Maria Schneider  Procedure(s) Performed: Procedure(s): LAPAROSCOPIC CHOLECYSTECTOMY WITH INTRAOPERATIVE CHOLANGIOGRAM (N/A)  Patient Location: PACU  Anesthesia Type:General  Level of Consciousness: Patient easily awoken, sedated, comfortable, cooperative, following commands, responds to stimulation.   Airway & Oxygen Therapy: Patient spontaneously breathing, ventilating well, oxygen via simple oxygen mask.  Post-op Assessment: Report given to PACU RN, vital signs reviewed and stable, moving all extremities.   Post vital signs: Reviewed and stable.  Complications: No apparent anesthesia complications

## 2013-07-13 DIAGNOSIS — K8050 Calculus of bile duct without cholangitis or cholecystitis without obstruction: Secondary | ICD-10-CM

## 2013-07-13 LAB — CBC WITH DIFFERENTIAL/PLATELET
BASOS ABS: 0 10*3/uL (ref 0.0–0.1)
BASOS PCT: 0 % (ref 0–1)
EOS PCT: 0 % (ref 0–5)
Eosinophils Absolute: 0 10*3/uL (ref 0.0–0.7)
HCT: 29.7 % — ABNORMAL LOW (ref 36.0–46.0)
Hemoglobin: 9.5 g/dL — ABNORMAL LOW (ref 12.0–15.0)
Lymphocytes Relative: 6 % — ABNORMAL LOW (ref 12–46)
Lymphs Abs: 0.2 10*3/uL — ABNORMAL LOW (ref 0.7–4.0)
MCH: 27.5 pg (ref 26.0–34.0)
MCHC: 32 g/dL (ref 30.0–36.0)
MCV: 86.1 fL (ref 78.0–100.0)
Monocytes Absolute: 0.3 10*3/uL (ref 0.1–1.0)
Monocytes Relative: 8 % (ref 3–12)
NEUTROS ABS: 3 10*3/uL (ref 1.7–7.7)
Neutrophils Relative %: 87 % — ABNORMAL HIGH (ref 43–77)
Platelets: 146 10*3/uL — ABNORMAL LOW (ref 150–400)
RBC: 3.45 MIL/uL — ABNORMAL LOW (ref 3.87–5.11)
RDW: 12.9 % (ref 11.5–15.5)
WBC: 3.5 10*3/uL — AB (ref 4.0–10.5)

## 2013-07-13 LAB — COMPREHENSIVE METABOLIC PANEL
ALK PHOS: 89 U/L (ref 39–117)
ALT: 45 U/L — ABNORMAL HIGH (ref 0–35)
AST: 35 U/L (ref 0–37)
Albumin: 2.9 g/dL — ABNORMAL LOW (ref 3.5–5.2)
BILIRUBIN TOTAL: 0.5 mg/dL (ref 0.3–1.2)
BUN: 4 mg/dL — AB (ref 6–23)
CO2: 25 meq/L (ref 19–32)
Calcium: 9 mg/dL (ref 8.4–10.5)
Chloride: 103 mEq/L (ref 96–112)
Creatinine, Ser: 0.6 mg/dL (ref 0.50–1.10)
GFR calc non Af Amer: >90 mL/min (ref >90)
GLUCOSE: 176 mg/dL — AB (ref 70–99)
Potassium: 3.9 mEq/L (ref 3.7–5.3)
Sodium: 140 mEq/L (ref 137–147)
Total Protein: 6.2 g/dL (ref 6.0–8.3)

## 2013-07-13 MED ORDER — INDOMETHACIN 50 MG RE SUPP
50.0000 mg | Freq: Once | RECTAL | Status: AC
  Administered 2013-07-14: 50 mg via RECTAL
  Filled 2013-07-13: qty 1

## 2013-07-13 MED ORDER — TRAMADOL HCL 50 MG PO TABS
50.0000 mg | ORAL_TABLET | Freq: Four times a day (QID) | ORAL | Status: DC | PRN
  Administered 2013-07-13: 50 mg via ORAL
  Filled 2013-07-13: qty 1

## 2013-07-13 NOTE — Progress Notes (Signed)
General Surgery Note  LOS: 4 days  POD -  1 Day Post-Op  Assessment/Plan: 1.  LAPAROSCOPIC CHOLECYSTECTOMY WITH INTRAOPERATIVE CHOLANGIOGRAM - 07/12/2013 - [facility]  For gall stone pancreatitis  Except for soreness, feels good  2.  Common bile duct stone - seen by Dr. Henrene Pastor.  Plan ERCP tomorrow with Dr. Henrene Pastor 3.  DVT prophylaxis - Lovenox 4.  HTN 5.  GERD 6.  Anemia - Hgb - 9.5 - 07/13/2013   Principal Problem:   Gallstone pancreatitis Active Problems:   HYPERTENSION   GERD   Gall stone pancreatitis  Subjective:  Doing okay except sore.  Wants a little more than clear liquids.  Her husband is in the room. Objective:   Filed Vitals:   07/13/13 1013  BP: 142/78  Pulse: 60  Temp:   Resp:      Intake/Output from previous day:  04/25 0701 - 04/26 0700 In: 2600 [I.V.:2600] Out: 2300 [Urine:2300]  Intake/Output this shift:  Total I/O In: 240 [P.O.:240] Out: -    Physical Exam:   General: WN WF who is alert and oriented.    HEENT: Normal. Pupils equal. .   Lungs: Clear.   Abdomen: Soft.    Wound: Okay.   Lab Results:    Recent Labs  07/11/13 0405 07/13/13 0500  WBC 7.3 3.5*  HGB 10.6* 9.5*  HCT 33.6* 29.7*  PLT 229 146*    BMET   Recent Labs  07/11/13 0405 07/13/13 0500  NA 140 140  K 4.1 3.9  CL 103 103  CO2 20 25  GLUCOSE 44* 176*  BUN 15 4*  CREATININE 0.69 0.60  CALCIUM 8.6 9.0    PT/INR  No results found for this basename: LABPROT, INR,  in the last 72 hours  ABG  No results found for this basename: PHART, PCO2, PO2, HCO3,  in the last 72 hours   Studies/Results:  Dg Cholangiogram Operative  07/12/2013   CLINICAL DATA:  Cholecystitis  EXAM: INTRAOPERATIVE CHOLANGIOGRAM  TECHNIQUE: Cholangiographic images from the C-arm fluoroscopic device were submitted for interpretation post-operatively. Please see the procedural report for the amount of contrast and the fluoroscopy time utilized.  COMPARISON:  Prior right upper quadrant ultrasound  07/09/2013  FINDINGS: A single standing fluoro loop obtained intraoperatively demonstrates cannulation of the cystic duct remnant with opacifications of the intra and extrahepatic biliary tree. There are of mobile filling defects in the distal common bile duct which are nonobstructive. A small amount of contrast material passes through the ampulla and into the duodenum, however some of the filling defects also pass into the ampulla and prevent further passage of contrast material. There is mild intra and extrahepatic biliary ductal dilatation. No mass lesion, stricture or stenosis.  IMPRESSION: Positive intraoperative cholangiogram. Small filling defects in the distal common bile duct consistent with choledocholithiasis. The distal common duct stones limits flow through the ampulla resulting in mild intra and extrahepatic biliary ductal dilatation.   Electronically Signed   By: Jacqulynn Cadet M.D.   On: 07/12/2013 15:28     Anti-infectives:   Anti-infectives   Start     Dose/Rate Route Frequency Ordered Stop   07/12/13 0600  [MAR Hold]  ciprofloxacin (CIPRO) IVPB 400 mg     (On MAR Hold since 07/12/13 1250)  Comments:  Pharmacy may adjust dosing strength, interval, or rate of medication as needed for optimal therapy for the patient Send with patient on call to the OR.  Anesthesia to complete antibiotic administration <11min prior  to incision per Best Practice.   400 mg 200 mL/hr over 60 Minutes Intravenous On call to O.R. 07/11/13 1055 07/12/13 1431      Alphonsa Overall, MD, FACS Pager: [facility] Office: [phone] 07/13/2013

## 2013-07-13 NOTE — Progress Notes (Signed)
[facility] Progress Note  Subjective:  Feels ok today.  Tolerating clear liquids.  IOC showed several small distal CBD stones.  Objective:  Vital signs in last 24 hours: Temp:  [98 F (36.7 C)-99.3 F (37.4 C)] 98.3 F (36.8 C) (04/26 0512) Pulse Rate:  [64-87] 64 (04/26 0512) Resp:  [11-20] 18 (04/26 0512) BP: (127-171)/(50-94) 160/89 mmHg (04/26 0512) SpO2:  [90 %-100 %] 93 % (04/26 0512) Last BM Date: 07/08/13 General:  Alert, Well-developed, in NAD Heart:  Regular rate and rhythm; no murmurs Pulm:  CTAB.  No W/R/R. Abdomen:  Soft, non-distended.  BS present.  Appropriately tender diffusely.  Extremities:  Without edema. Neurologic:  Alert and  oriented x4;  grossly normal neurologically. Psych:  Alert and cooperative. Normal mood and affect.  Intake/Output from previous day: 04/25 0701 - 04/26 0700 In: 2600 [I.V.:2600] Out: 2300 [Urine:2300] Intake/Output this shift: Total I/O In: 240 [P.O.:240] Out: -   Lab Results:  Recent Labs  07/11/13 0405 07/13/13 0500  WBC 7.3 3.5*  HGB 10.6* 9.5*  HCT 33.6* 29.7*  PLT 229 146*   BMET  Recent Labs  07/11/13 0405 07/13/13 0500  NA 140 140  K 4.1 3.9  CL 103 103  CO2 20 25  GLUCOSE 44* 176*  BUN 15 4*  CREATININE 0.69 0.60  CALCIUM 8.6 9.0   LFT  Recent Labs  07/13/13 0500  PROT 6.2  ALBUMIN 2.9*  AST 35  ALT 45*  ALKPHOS 89  BILITOT 0.5   Dg Cholangiogram Operative  07/12/2013   CLINICAL DATA:  Cholecystitis  EXAM: INTRAOPERATIVE CHOLANGIOGRAM  TECHNIQUE: Cholangiographic images from the C-arm fluoroscopic device were submitted for interpretation post-operatively. Please see the procedural report for the amount of contrast and the fluoroscopy time utilized.  COMPARISON:  Prior right upper quadrant ultrasound 07/09/2013  FINDINGS: A single standing fluoro loop obtained intraoperatively demonstrates cannulation of the cystic duct remnant with opacifications of the intra and extrahepatic  biliary tree. There are of mobile filling defects in the distal common bile duct which are nonobstructive. A small amount of contrast material passes through the ampulla and into the duodenum, however some of the filling defects also pass into the ampulla and prevent further passage of contrast material. There is mild intra and extrahepatic biliary ductal dilatation. No mass lesion, stricture or stenosis.  IMPRESSION: Positive intraoperative cholangiogram. Small filling defects in the distal common bile duct consistent with choledocholithiasis. The distal common duct stones limits flow through the ampulla resulting in mild intra and extrahepatic biliary ductal dilatation.   Electronically Signed   By: Jacqulynn Cadet M.D.   On: 07/12/2013 15:28    Assessment / Plan: -Acute pancreatitis: Gallstone pancreatitis with elevated LFT's and ultrasound showing cholelithiasis with dilated CBD suggestive of obstructive process. Lipase recovered quickly. LFT's also trending down with now normal bili for the past 3 days.  S/p lap chole yesterday, 4/25, with IOC showing several small filling defects in the distal CBD.   *ERCP tomorrow, 4/27, with general anesthesia.  Will give pre-op dose of prophylactic antibiotics as well as two-50 mg indomethacin suppositories to help reduce the risk of post-ERCP pancreatitis.    LOS: 4 days   Laban Emperor. Zehr  07/13/2013, 9:29 AM  Pager number [phone]   GI ATTENDING  Interval history and data reviewed. Patient seen and examined. Agree with above. Status post laparoscopic cholecystectomy. IOC demonstrated small distal stones. Plan for ERCP/sphincterotomy tomorrow with Dr. Carlean Purl.The nature of the procedure, as well as  the risks, benefits, and alternatives were carefully and thoroughly reviewed with the patient. Cartoon illustration provided for patient benefit. Ample time for discussion and questions allowed. The patient understood, was satisfied, and agreed  proceed.  Docia Chuck. Geri Seminole., M.D. [facility] of Gastroenterology

## 2013-07-14 ENCOUNTER — Inpatient Hospital Stay (HOSPITAL_COMMUNITY): Payer: Medicare Other | Admitting: Certified Registered Nurse Anesthetist

## 2013-07-14 ENCOUNTER — Inpatient Hospital Stay (HOSPITAL_COMMUNITY): Payer: Medicare Other

## 2013-07-14 ENCOUNTER — Encounter (HOSPITAL_COMMUNITY): Payer: Self-pay | Admitting: Anesthesiology

## 2013-07-14 ENCOUNTER — Encounter (HOSPITAL_COMMUNITY): Payer: Medicare Other | Admitting: Certified Registered Nurse Anesthetist

## 2013-07-14 ENCOUNTER — Encounter (HOSPITAL_COMMUNITY): Admission: EM | Disposition: A | Discharge status: Home / Self Care | Payer: Self-pay | Source: Home / Self Care

## 2013-07-14 HISTORY — PX: ERCP: SHX5425

## 2013-07-14 SURGERY — ERCP, WITH INTERVENTION IF INDICATED
Anesthesia: General

## 2013-07-14 MED ORDER — ESMOLOL HCL 10 MG/ML IV SOLN
INTRAVENOUS | Status: DC | PRN
  Administered 2013-07-14: 30 mg via INTRAVENOUS
  Administered 2013-07-14: 20 mg via INTRAVENOUS

## 2013-07-14 MED ORDER — ALBUTEROL SULFATE (2.5 MG/3ML) 0.083% IN NEBU
2.5000 mg | INHALATION_SOLUTION | RESPIRATORY_TRACT | Status: DC
  Administered 2013-07-14 (×2): 2.5 mg via RESPIRATORY_TRACT
  Filled 2013-07-14 (×2): qty 3

## 2013-07-14 MED ORDER — CIPROFLOXACIN IN D5W 400 MG/200ML IV SOLN
400.0000 mg | Freq: Once | INTRAVENOUS | Status: DC
  Filled 2013-07-14: qty 200

## 2013-07-14 MED ORDER — ONDANSETRON HCL 4 MG/2ML IJ SOLN
INTRAMUSCULAR | Status: AC
  Filled 2013-07-14: qty 2

## 2013-07-14 MED ORDER — FENTANYL CITRATE 0.05 MG/ML IJ SOLN
INTRAMUSCULAR | Status: AC
  Filled 2013-07-14: qty 2

## 2013-07-14 MED ORDER — KETAMINE HCL 10 MG/ML IJ SOLN
INTRAMUSCULAR | Status: DC | PRN
  Administered 2013-07-14: 20 mg via INTRAVENOUS

## 2013-07-14 MED ORDER — ONDANSETRON HCL 4 MG/2ML IJ SOLN
INTRAMUSCULAR | Status: DC | PRN
  Administered 2013-07-14: 4 mg via INTRAVENOUS

## 2013-07-14 MED ORDER — FENTANYL CITRATE 0.05 MG/ML IJ SOLN: 25.0000 ug | INTRAMUSCULAR | Status: DC | PRN

## 2013-07-14 MED ORDER — SODIUM CHLORIDE 0.9 % IV SOLN
INTRAVENOUS | Status: DC | PRN
  Administered 2013-07-14: 13:00:00

## 2013-07-14 MED ORDER — ESMOLOL HCL 10 MG/ML IV SOLN
INTRAVENOUS | Status: AC
  Filled 2013-07-14: qty 10

## 2013-07-14 MED ORDER — DEXAMETHASONE SODIUM PHOSPHATE 10 MG/ML IJ SOLN
INTRAMUSCULAR | Status: DC | PRN
  Administered 2013-07-14: 10 mg via INTRAVENOUS

## 2013-07-14 MED ORDER — LIDOCAINE HCL (CARDIAC) 20 MG/ML IV SOLN
INTRAVENOUS | Status: AC
  Filled 2013-07-14: qty 5

## 2013-07-14 MED ORDER — PROPOFOL 10 MG/ML IV BOLUS
INTRAVENOUS | Status: AC
  Filled 2013-07-14: qty 20

## 2013-07-14 MED ORDER — EPHEDRINE SULFATE 50 MG/ML IJ SOLN
INTRAMUSCULAR | Status: DC | PRN
  Administered 2013-07-14: 10 mg via INTRAVENOUS

## 2013-07-14 MED ORDER — PROPOFOL 10 MG/ML IV BOLUS
INTRAVENOUS | Status: DC | PRN
  Administered 2013-07-14: 150 mg via INTRAVENOUS

## 2013-07-14 MED ORDER — FUROSEMIDE 10 MG/ML IJ SOLN
20.0000 mg | Freq: Once | INTRAMUSCULAR | Status: AC
  Administered 2013-07-14: 20 mg via INTRAVENOUS
  Filled 2013-07-14: qty 2

## 2013-07-14 MED ORDER — DEXAMETHASONE SODIUM PHOSPHATE 10 MG/ML IJ SOLN
INTRAMUSCULAR | Status: AC
  Filled 2013-07-14: qty 1

## 2013-07-14 MED ORDER — ALBUTEROL SULFATE (2.5 MG/3ML) 0.083% IN NEBU
INHALATION_SOLUTION | RESPIRATORY_TRACT | Status: DC | PRN
  Administered 2013-07-14: 2.5 mg via RESPIRATORY_TRACT

## 2013-07-14 MED ORDER — ALBUTEROL SULFATE (2.5 MG/3ML) 0.083% IN NEBU
INHALATION_SOLUTION | RESPIRATORY_TRACT | Status: AC
  Filled 2013-07-14: qty 3

## 2013-07-14 MED ORDER — REMIFENTANIL HCL 1 MG IV SOLR
INTRAVENOUS | Status: AC
  Filled 2013-07-14: qty 1000

## 2013-07-14 MED ORDER — FENTANYL CITRATE 0.05 MG/ML IJ SOLN
INTRAMUSCULAR | Status: DC | PRN
  Administered 2013-07-14 (×2): 50 ug via INTRAVENOUS

## 2013-07-14 MED ORDER — SODIUM CHLORIDE 0.9 % IV SOLN: INTRAVENOUS | Status: DC

## 2013-07-14 MED ORDER — LACTATED RINGERS IV SOLN
INTRAVENOUS | Status: DC | PRN
  Administered 2013-07-14: 12:00:00 via INTRAVENOUS

## 2013-07-14 MED ORDER — SUCCINYLCHOLINE CHLORIDE 20 MG/ML IJ SOLN
INTRAMUSCULAR | Status: DC | PRN
  Administered 2013-07-14: 100 mg via INTRAVENOUS

## 2013-07-14 MED ORDER — REMIFENTANIL HCL 2 MG IV SOLR
2000.0000 ug | INTRAVENOUS | Status: DC | PRN
  Administered 2013-07-14: .125 ug/kg/min via INTRAVENOUS

## 2013-07-14 MED ORDER — CIPROFLOXACIN IN D5W 400 MG/200ML IV SOLN
400.0000 mg | Freq: Once | INTRAVENOUS | Status: AC
  Administered 2013-07-14: 400 mg via INTRAVENOUS
  Filled 2013-07-14: qty 200

## 2013-07-14 MED ORDER — LIDOCAINE HCL (CARDIAC) 20 MG/ML IV SOLN
INTRAVENOUS | Status: DC | PRN
  Administered 2013-07-14: 100 mg via INTRAVENOUS

## 2013-07-14 NOTE — Anesthesia Postprocedure Evaluation (Signed)
  Anesthesia Post-op Note  Patient: Maria Schneider  Procedure(s) Performed: Procedure(s) (LRB): ENDOSCOPIC RETROGRADE CHOLANGIOPANCREATOGRAPHY (ERCP) (N/A)  Patient Location: PACU  Anesthesia Type: General  Level of Consciousness: awake and alert   Airway and Oxygen Therapy: Patient Spontanous Breathing  Post-op Pain: mild  Post-op Assessment: Post-op Vital signs reviewed, Patient's Cardiovascular Status Stable, Respiratory Function unstable, Patent Airway and No signs of Nausea or vomiting  Last Vitals:  Filed Vitals:   07/14/13 1410  BP: 155/107  Pulse:   Temp:   Resp: 23    Post-op Vital Signs: oxygen saturations low on simple face mask.   Complications: Postoperative pulmonary edema on chest xray and clinically. Currently has a cough, rales on posterior bilateral auscultation. Oxygen saturation 95% on simple face mask. Denies significant SOB. Lasix 20 mg IV ordered. Discussed with patient and patient's husband. Discussed with Dr. Carlean Purl and Dr. Harlow Asa.

## 2013-07-14 NOTE — Progress Notes (Signed)
Patient ID: Maria Schneider, female   DOB: February 05, 1946, 68 y.o.   MRN: [id number] 2 Days Post-Op  Subjective: Pt feels well today.  ERCP today  Objective: Vital signs in last 24 hours: Temp:  [97.7 F (36.5 C)-98.2 F (36.8 C)] 97.8 F (36.6 C) (04/27 0546) Pulse Rate:  [60-67] 60 (04/27 0546) Resp:  [16-18] 16 (04/27 0546) BP: (138-151)/(72-88) 139/72 mmHg (04/27 0546) SpO2:  [95 %-97 %] 97 % (04/27 0546) Last BM Date: 07/08/13  Intake/Output from previous day: 04/26 0701 - 04/27 0700 In: 4320 [P.O.:720; I.V.:3600] Out: 2425 [Urine:2425] Intake/Output this shift:    PE: Abd: soft, appropriately tender, +BS, ND, incisions c/d/i  Lab Results:   Recent Labs  07/13/13 0500  WBC 3.5*  HGB 9.5*  HCT 29.7*  PLT 146*   BMET  Recent Labs  07/13/13 0500  NA 140  K 3.9  CL 103  CO2 25  GLUCOSE 176*  BUN 4*  CREATININE 0.60  CALCIUM 9.0   PT/INR No results found for this basename: LABPROT, INR,  in the last 72 hours CMP     Component Value Date/Time   NA 140 07/13/2013 0500   K 3.9 07/13/2013 0500   CL 103 07/13/2013 0500   CO2 25 07/13/2013 0500   GLUCOSE 176* 07/13/2013 0500   BUN 4* 07/13/2013 0500   CREATININE 0.60 07/13/2013 0500   CALCIUM 9.0 07/13/2013 0500   PROT 6.2 07/13/2013 0500   ALBUMIN 2.9* 07/13/2013 0500   AST 35 07/13/2013 0500   ALT 45* 07/13/2013 0500   ALKPHOS 89 07/13/2013 0500   BILITOT 0.5 07/13/2013 0500   GFRNONAA >90 07/13/2013 0500   GFRAA >90 07/13/2013 0500   Lipase     Component Value Date/Time   LIPASE 35 07/11/2013 0405       Studies/Results: Dg Cholangiogram Operative  07/12/2013   CLINICAL DATA:  Cholecystitis  EXAM: INTRAOPERATIVE CHOLANGIOGRAM  TECHNIQUE: Cholangiographic images from the C-arm fluoroscopic device were submitted for interpretation post-operatively. Please see the procedural report for the amount of contrast and the fluoroscopy time utilized.  COMPARISON:  Prior right upper quadrant ultrasound 07/09/2013   FINDINGS: A single standing fluoro loop obtained intraoperatively demonstrates cannulation of the cystic duct remnant with opacifications of the intra and extrahepatic biliary tree. There are of mobile filling defects in the distal common bile duct which are nonobstructive. A small amount of contrast material passes through the ampulla and into the duodenum, however some of the filling defects also pass into the ampulla and prevent further passage of contrast material. There is mild intra and extrahepatic biliary ductal dilatation. No mass lesion, stricture or stenosis.  IMPRESSION: Positive intraoperative cholangiogram. Small filling defects in the distal common bile duct consistent with choledocholithiasis. The distal common duct stones limits flow through the ampulla resulting in mild intra and extrahepatic biliary ductal dilatation.   Electronically Signed   By: Jacqulynn Cadet M.D.   On: 07/12/2013 15:28    Anti-infectives: Anti-infectives   Start     Dose/Rate Route Frequency Ordered Stop   07/14/13 1100  ciprofloxacin (CIPRO) IVPB 400 mg     400 mg 200 mL/hr over 60 Minutes Intravenous  Once 07/14/13 0842     07/12/13 0600  [MAR Hold]  ciprofloxacin (CIPRO) IVPB 400 mg     (On MAR Hold since 07/12/13 1250)  Comments:  Pharmacy may adjust dosing strength, interval, or rate of medication as needed for optimal therapy for the patient Send with patient on  call to the OR.  Anesthesia to complete antibiotic administration <86min prior to incision per [facility].   400 mg 200 mL/hr over 60 Minutes Intravenous On call to O.R. 07/11/13 1055 07/12/13 1431       Assessment/Plan  1. Gallstone pancreatitis 2. S/p lap chole with CBD stones POD 2  Plan: 1. ERCP today, hopefully home tomorrow   LOS: 5 days    Henreitta Cea 07/14/2013, 9:58 AM Pager: [phone]

## 2013-07-14 NOTE — Anesthesia Preprocedure Evaluation (Signed)
Anesthesia Evaluation  Patient identified by MRN, date of birth, ID band Patient awake    Reviewed: Allergy & Precautions, H&P , NPO status , Patient's Chart, lab work & pertinent test results  Airway Mallampati: II TM Distance: >3 FB Neck ROM: Full    Dental no notable dental hx.    Pulmonary asthma , former smoker,  breath sounds clear to auscultation  Pulmonary exam normal       Cardiovascular Exercise Tolerance: Good hypertension, Pt. on medications and Pt. on home beta blockers Rhythm:Regular Rate:Normal     Neuro/Psych PSYCHIATRIC DISORDERS Depression negative neurological ROS     GI/Hepatic negative GI ROS, Neg liver ROS, GERD-  ,  Endo/Other  negative endocrine ROS  Renal/GU Renal diseaseUrinary incontinence  negative genitourinary   Musculoskeletal negative musculoskeletal ROS (+)   Abdominal   Peds negative pediatric ROS (+)  Hematology negative hematology ROS (+)   Anesthesia Other Findings   Reproductive/Obstetrics negative OB ROS                           Anesthesia Physical Anesthesia Plan  ASA: II  Anesthesia Plan: General   Post-op Pain Management:    Induction: Intravenous  Airway Management Planned: Oral ETT  Additional Equipment:   Intra-op Plan:   Post-operative Plan: Extubation in OR  Informed Consent: I have reviewed the patients History and Physical, chart, labs and discussed the procedure including the risks, benefits and alternatives for the proposed anesthesia with the patient or authorized representative who has indicated his/her understanding and acceptance.   Dental advisory given  Plan Discussed with: CRNA  Anesthesia Plan Comments:         Anesthesia Quick Evaluation

## 2013-07-14 NOTE — Transfer of Care (Signed)
Immediate Anesthesia Transfer of Care Note  Patient: Maria Schneider  Procedure(s) Performed: Procedure(s) (LRB): ENDOSCOPIC RETROGRADE CHOLANGIOPANCREATOGRAPHY (ERCP) (N/A)  Patient Location: PACU  Anesthesia Type: General  Level of Consciousness: sedated, patient cooperative and responds to stimulation  Airway & Oxygen Therapy: Patient Spontanous Breathing and Patient connected to face mask oxgen  Post-op Assessment: Report given to PACU RN and Post -op Vital signs reviewed and stable  Post vital signs: Reviewed and stable  Complications: No apparent anesthesia complications

## 2013-07-14 NOTE — Op Note (Signed)
[facility] [place], [address]   ERCP PROCEDURE REPORT  PATIENT: Maria Schneider, Maria Schneider.  MR# :[id number] BIRTHDATE: 1945/08/15  GENDER: Female ENDOSCOPIST: Gatha Mayer, MD, [facility] REFERRED BY: [facility] PROCEDURE DATE:  07/14/2013 PROCEDURE:   ERCP with sphincterotomy/papillotomy ASA CLASS:   Class III INDICATIONS:abnormal intraoperative cholangiogram . suspect stones MEDICATIONS: See Anesthesia Report and General endotracheal anesthesia (GETA) TOPICAL ANESTHETIC: none  DESCRIPTION OF PROCEDURE:   After the risks benefits and alternatives of the procedure were thoroughly explained, informed consent was obtained.  The     endoscope was introduced through the mouth  and advanced to the second portion of the duodenum .  Esophagus not seen well.  Stomach and duodenum normal. Papilla normal, draining bile.  Easily cannulated using wire-guided technique. Cholangiogram showed a dilated CBD of 12-13 mm, CHD same. Biliary sphincterotomy performed given suspicion of stones. No stones seen - balloon sweeps performed - no stones out. Occlusion cholangiogram negative.  Air bubbled were noted at times. The scope was then completely withdrawn from the patient and the procedure terminated.     COMPLICATIONS: .  There were no complications.  ENDOSCOPIC IMPRESSION: Esophagus not seen well.  Stomach and duodenum normal. Papilla normal, draining bile.  Easily cannulated using wire-guided technique. Cholangiogram showed a dilated CBD of 12-13 mm, CHD same. Intrahepatics midly dilated. s/p cholecyetectomy. Biliary sphincterotomy performed given suspicion of stones. No stones seen - balloon sweeps performed - no stones out. Occlusion cholangiogram negative.  Air bubbled were noted at times.   RECOMMENDATIONS: Clear liquids Reassess AM - if ok advance diet and dc  eSigned:  Gatha Mayer, MD, [facility] 07/14/2013 12:56 PM

## 2013-07-14 NOTE — Progress Notes (Signed)
General Surgery [facility]  Patient seen and examined.  Family at bedside.  Anticipate ERCP later this AM.  Will follow up on results.  Likely home tomorrow if continued progress.  Earnstine Regal, MD, [facility] Office: [phone]

## 2013-07-14 NOTE — Progress Notes (Signed)
She states she has improved, and is not coughing anymore. Oxygen saturations are 98% on simple face mask. RN in room. She states she has had a good response to the IV lasix with good urine output.

## 2013-07-14 NOTE — Progress Notes (Signed)
Patient ID: Maria Schneider, female   DOB: [birth year], 68 y.o.   MRN: [id number]  [facility]  Patient seen and evaluated in room following ERCP.  Discussed with Dr. Eliseo Squires.  Dr. Celesta Aver reports reviewed.  Good response to diuretics.  Receiving albuteral NMT now.  On pulse ox.  Will monitor overnight.  Hopefully transient and will be able to go home tomorrow if improved.  Husband at bedside.  Earnstine Regal, MD, [facility] Office: [phone]

## 2013-07-14 NOTE — Progress Notes (Signed)
BP up and coughing, requiring FM 100% O2 - Anesthesia evaluating

## 2013-07-15 ENCOUNTER — Inpatient Hospital Stay (HOSPITAL_COMMUNITY): Payer: Medicare Other

## 2013-07-15 ENCOUNTER — Telehealth (INDEPENDENT_AMBULATORY_CARE_PROVIDER_SITE_OTHER): Payer: Self-pay | Admitting: General Surgery

## 2013-07-15 ENCOUNTER — Encounter (HOSPITAL_COMMUNITY): Payer: Self-pay | Admitting: Internal Medicine

## 2013-07-15 DIAGNOSIS — R932 Abnormal findings on diagnostic imaging of liver and biliary tract: Secondary | ICD-10-CM

## 2013-07-15 LAB — CBC
HEMATOCRIT: 32.2 % — AB (ref 36.0–46.0)
HEMOGLOBIN: 9.9 g/dL — AB (ref 12.0–15.0)
MCH: 26.5 pg (ref 26.0–34.0)
MCHC: 30.7 g/dL (ref 30.0–36.0)
MCV: 86.3 fL (ref 78.0–100.0)
Platelets: 207 10*3/uL (ref 150–400)
RBC: 3.73 MIL/uL — ABNORMAL LOW (ref 3.87–5.11)
RDW: 13.2 % (ref 11.5–15.5)
WBC: 3.5 10*3/uL — AB (ref 4.0–10.5)

## 2013-07-15 LAB — COMPREHENSIVE METABOLIC PANEL
ALBUMIN: 2.9 g/dL — AB (ref 3.5–5.2)
ALK PHOS: 267 U/L — AB (ref 39–117)
ALT: 173 U/L — ABNORMAL HIGH (ref 0–35)
AST: 149 U/L — ABNORMAL HIGH (ref 0–37)
BILIRUBIN TOTAL: 0.5 mg/dL (ref 0.3–1.2)
BUN: 4 mg/dL — ABNORMAL LOW (ref 6–23)
CHLORIDE: 104 meq/L (ref 96–112)
CO2: 27 meq/L (ref 19–32)
Calcium: 8.9 mg/dL (ref 8.4–10.5)
Creatinine, Ser: 0.65 mg/dL (ref 0.50–1.10)
GFR calc Af Amer: >90 mL/min (ref >90)
GFR calc non Af Amer: >90 mL/min (ref >90)
GLUCOSE: 145 mg/dL — AB (ref 70–99)
POTASSIUM: 3.8 meq/L (ref 3.7–5.3)
Sodium: 142 mEq/L (ref 137–147)
Total Protein: 6.1 g/dL (ref 6.0–8.3)

## 2013-07-15 LAB — LIPASE, BLOOD: Lipase: 55 U/L (ref 11–59)

## 2013-07-15 LAB — AMYLASE: AMYLASE: 64 U/L (ref 0–105)

## 2013-07-15 MED ORDER — POLYETHYLENE GLYCOL 3350 17 G PO PACK: 17.0000 g | PACK | Freq: Every day | ORAL | Status: DC | PRN

## 2013-07-15 MED ORDER — PANTOPRAZOLE SODIUM 40 MG PO TBEC: 40.0000 mg | DELAYED_RELEASE_TABLET | Freq: Every day | ORAL | Status: DC

## 2013-07-15 MED ORDER — POLYETHYLENE GLYCOL 3350 17 G PO PACK
17.0000 g | PACK | Freq: Every day | ORAL | Status: DC
  Administered 2013-07-15: 17 g via ORAL
  Filled 2013-07-15: qty 1

## 2013-07-15 MED ORDER — OXYCODONE-ACETAMINOPHEN 5-325 MG PO TABS: 1.0000 | ORAL_TABLET | ORAL | Status: DC | PRN

## 2013-07-15 MED ORDER — ALBUTEROL SULFATE (2.5 MG/3ML) 0.083% IN NEBU
2.5000 mg | INHALATION_SOLUTION | Freq: Three times a day (TID) | RESPIRATORY_TRACT | Status: DC
  Administered 2013-07-15 (×2): 2.5 mg via RESPIRATORY_TRACT
  Filled 2013-07-15 (×2): qty 3

## 2013-07-15 MED ORDER — ACETAMINOPHEN 325 MG PO TABS: 650.0000 mg | ORAL_TABLET | Freq: Four times a day (QID) | ORAL | Status: DC | PRN

## 2013-07-15 MED ORDER — METHYLCELLULOSE (LAXATIVE) PO POWD: 1.0000 | Freq: Every day | ORAL | Status: DC

## 2013-07-15 MED ORDER — ALBUTEROL SULFATE (2.5 MG/3ML) 0.083% IN NEBU: 2.5000 mg | INHALATION_SOLUTION | RESPIRATORY_TRACT | Status: DC | PRN

## 2013-07-15 NOTE — Telephone Encounter (Signed)
Pt had one fever of 101.9.  She feels generally pretty well.  She has no severe pain, and minimal nausea.  ERCP yesterday, Lap chole 3 days ago.  I advised that if she spikes high fevers consistently that she will probably need to come to the ED.  If not, we will need to get labs/studies tomorrow to check U/a/CXR/CMET/CBC for signs of infection.

## 2013-07-15 NOTE — Discharge Instructions (Signed)
Laparoscopic Cholecystectomy, Care After Refer to this sheet in the next few weeks. These instructions provide you with information on caring for yourself after your procedure. Your health care provider may also give you more specific instructions. Your treatment has been planned according to current medical practices, but problems sometimes occur. Call your health care provider if you have any problems or questions after your procedure. WHAT TO EXPECT AFTER THE PROCEDURE After your procedure, it is typical to have the following:  Pain at your incision sites. You will be given pain medicines to control the pain.  Mild nausea or vomiting. This should improve after the first 24 hours.  Bloating and possibly shoulder pain from the gas used during the procedure. This will improve after the first 24 hours. HOME CARE INSTRUCTIONS   Change bandages (dressings) as directed by your health care provider.  Keep the wound dry and clean. You may wash the wound gently with soap and water. Gently blot or dab the area dry.  Do not take baths or use swimming pools or hot tubs for 2 weeks or until your health care provider approves.  Only take over-the-counter or prescription medicines as directed by your health care provider.  Continue your normal diet as directed by your health care provider.  Do not lift anything heavier than 10 pounds (4.5 kg) until your health care provider approves.  Do not play contact sports for 1 week or until your health care provider approves. SEEK MEDICAL CARE IF:   You have redness, swelling, or increasing pain in the wound.  You notice yellowish-white fluid (pus) coming from the wound.  You have drainage from the wound that lasts longer than 1 day.  You notice a bad smell coming from the wound or dressing.  Your surgical cuts (incisions) break open. SEEK IMMEDIATE MEDICAL CARE IF:   You develop a rash.  You have difficulty breathing.  You have chest pain.  You  have a fever.  You have increasing pain in the shoulders (shoulder strap areas).  You have dizzy episodes or faint while standing.  You have severe abdominal pain.  You feel sick to your stomach (nauseous) or throw up (vomit) and this lasts for more than 1 day. Document Released: 03/06/2005 Document Revised: 12/25/2012 Document Reviewed: 10/16/2012 [facility] Patient Information 2014 [place].  Endoscopic Retrograde Cholangiopancreatography (ERCP) was done by Dr. Silvano Rusk. Endoscopic retrograde cholangiopancreatography (ERCP) is a procedure used to diagnosis many diseases of the pancreas, bile ducts, liver, and gallbladder. During ERCP a thin, lighted tube (endoscope) is passed through the mouth and down the back of the throat into the first part of the small intestine (duodenum). A small, plastic tube (cannula) is then passed through the endoscope and directed into the bile duct or pancreatic duct. Dye is then injected through the cannula and X-rays are taken to study the biliary and pancreatic passageways.  LET [facility] CARE PROVIDER KNOW ABOUT:   Any allergies you have.   All medicines you are taking, including vitamins, herbs, eyedrops, creams, and over-the-counter medicines.   Previous problems you or members of your family have had with the use of anesthetics.   Any blood disorders you have.   Previous surgeries you have had.   Medical conditions you have. RISKS AND COMPLICATIONS Generally, ERCP is a safe procedure. However, as with any procedure, complications can occur. A simple removal of gallstones has the lowest rate of complications. Higher rates of complication occur in people who have poorly functioning  bile or pancreatic ducts. Possible complications include:   Pancreatitis.  Bleeding.  Accidental punctures in the bowel wall, pancreas, or gall bladder.  Gall bladder or bile duct infection. BEFORE THE PROCEDURE   Do not eat or drink anything,  including water, for at least 8 hours before the procedure or as directed by your health care provider.   Ask your health care provider whether you should stop taking certain medicines prior to your procedure.   Arrange for someone to drive you home. You will not be allowed to drive for 12 24 hours after the procedure. PROCEDURE   You will be given medicine through a vein (intravenously) to make you relaxed and sleepy.   You might have a breathing tube placed to give you medicine that makes you sleep (general anesthetic).   Your throat may be sprayed with medicine that numbs the area and prevents gagging (local anesthetic), or you may gargle this medicine.   You will lie on your left side.   The endoscope will be inserted through your mouth and into the duodenum. The tube will not interfere with your breathing. Gagging is prevented by the anesthesia.   While X-rays are being taken, you may be positioned on your stomach.   A small sample of tissue (biopsy) may be removed for examination. AFTER THE PROCEDURE   You will rest in bed until you are fully conscious.   When you first wake up, your throat may feel slightly sore.   You will not be allowed to eat or drink until numbness subsides.   Once you are able to drink, urinate, and sit on the edge of the bed without feeling sick to your stomach (nauseous) or dizzy, you may be allowed to go home. Document Released: 11/29/2000 Document Revised: 12/25/2012 Document Reviewed: 10/15/2012 [facility] Patient Information 2014 [place], [place]. [facility] LAPAROSCOPIC SURGERY: POST OP INSTRUCTIONS Always review your discharge instruction sheet given to you by the facility where your surgery was performed. IF YOU HAVE DISABILITY OR FAMILY LEAVE FORMS, YOU MUST BRING THEM TO THE OFFICE FOR PROCESSING.   DO NOT GIVE THEM TO YOUR DOCTOR.  1. A prescription for pain medication may be given to you upon  discharge.  Take your pain medication as prescribed, if needed.  If narcotic pain medicine is not needed, then you may take acetaminophen (Tylenol) or ibuprofen (Advil) as needed. 2. Take your usually prescribed medications unless otherwise directed. 3. If you need a refill on your pain medication, please contact your pharmacy.  They will contact our office to request authorization. Prescriptions will not be filled after 5pm or on week-ends. 4. You should follow a light diet the first few days after arrival home, such as soup and crackers, etc.  Be sure to include lots of fluids daily. 5. Most patients will experience some swelling and bruising in the area of the incisions.  Ice packs will help.  Swelling and bruising can take several days to resolve.  6. It is common to experience some constipation if taking pain medication after surgery.  Increasing fluid intake and taking a stool softener (such as Colace) will usually help or prevent this problem from occurring.  A mild laxative (Milk of Magnesia or Miralax) should be taken according to package instructions if there are no bowel movements after 48 hours. 7. Unless discharge instructions indicate otherwise, you may remove your bandages 24-48 hours after surgery, and you may shower at that time.  You may have  steri-strips (small skin tapes) in place directly over the incision.  These strips should be left on the skin for 7-10 days.  If your surgeon used skin glue on the incision, you may shower in 24 hours.  The glue will flake off over the next 2-3 weeks.  Any sutures or staples will be removed at the office during your follow-up visit. 8. ACTIVITIES:  You may resume regular (light) daily activities beginning the next day--such as daily self-care, walking, climbing stairs--gradually increasing activities as tolerated.  You may have sexual intercourse when it is comfortable.  Refrain from any heavy lifting or straining until approved by your doctor. a. You  may drive when you are no longer taking prescription pain medication, you can comfortably wear a seatbelt, and you can safely maneuver your car and apply brakes. b. RETURN TO WORK:  __________________________________________________________ 9. You should see your doctor in the office for a follow-up appointment approximately 2-3 weeks after your surgery.  Make sure that you call for this appointment within a day or two after you arrive home to insure a convenient appointment time. 10. OTHER INSTRUCTIONS: __________________________________________________________________________________________________________________________ __________________________________________________________________________________________________________________________ WHEN TO CALL YOUR DOCTOR: 1. Fever over 101.0 2. Inability to urinate 3. Continued bleeding from incision. 4. Increased pain, redness, or drainage from the incision. 5. Increasing abdominal pain  The clinic staff is available to answer your questions during regular business hours.  Please dont hesitate to call and ask to speak to one of the nurses for clinical concerns.  If you have a medical emergency, go to the nearest emergency room or call 911.  A surgeon from [facility] is always on call at the hospital. [address] [place], [place], [place]  [address] ? [address], [place], [place]   [address] [phone] ? [phone] ? FAX [phone] Web site: [email or web address]

## 2013-07-15 NOTE — Progress Notes (Signed)
This patient is receiving Protonix IV. Based on criteria approved by the Pharmacy and Therapeutics Committee, this medication is being converted to the equivalent oral dose form. These criteria include:   . The patient is eating (either orally or per tube) and/or has been taking other orally administered medications for at least 24 hours.  . This patient has no evidence of active gastrointestinal bleeding or impaired GI absorption (gastrectomy, short bowel, patient on TNA or NPO).   If you have questions about this conversion, please contact the pharmacy department.  Minda Ditto, [facility] 07/15/2013 1:17 PM

## 2013-07-15 NOTE — Discharge Summary (Signed)
General Surgery [facility]  Agree with summary by Modena Jansky.  See today's progress note.  Follow up at [place] office.  Earnstine Regal, MD, [facility] Office: [phone]

## 2013-07-15 NOTE — Progress Notes (Signed)
Progress Note   Subjective  no abdominal pain, hungry. Having some bowel accidents.    Objective   Vital signs in last 24 hours: Temp:  [98 F (36.7 C)-98.5 F (36.9 C)] 98.5 F (36.9 C) (04/28 3710) Resp:  [14-24] 18 (04/28 0619) BP: (51-215)/(38-113) 154/83 mmHg (04/28 0619) SpO2:  [89 %-99 %] 99 % (04/28 0912) FiO2 (%):  [28 %] 28 % (04/27 1157) Last BM Date: 07/14/13 General:    Pleasant white female in NAD Heart:  Regular rate and rhythm Lungs: Respirations even and unlabored, Bibasilar inspiratory crackles. Abdomen:  Soft, nontender and nondistended. Normal bowel sounds. Extremities:  Without edema. Neurologic:  Alert and oriented,  grossly normal neurologically. Psych:  Cooperative. Normal mood and affect.    Lab Results:  Recent Labs  07/13/13 0500 07/15/13 0400  WBC 3.5* 3.5*  HGB 9.5* 9.9*  HCT 29.7* 32.2*  PLT 146* 207   BMET  Recent Labs  07/13/13 0500 07/15/13 0400  NA 140 142  K 3.9 3.8  CL 103 104  CO2 25 27  GLUCOSE 176* 145*  BUN 4* 4*  CREATININE 0.60 0.65  CALCIUM 9.0 8.9   LFT  Recent Labs  07/15/13 0400  PROT 6.1  ALBUMIN 2.9*  AST 149*  ALT 173*  ALKPHOS 267*  BILITOT 0.5    Studies/Results: Dg Chest Port 1 View  07/14/2013   CLINICAL DATA:  Dyspnea  EXAM: PORTABLE CHEST - 1 VIEW  COMPARISON:  DG CHEST 2 VIEW dated 07/09/2013  FINDINGS: There is bilateral diffuse interstitial thickening and mild peribronchial cuffing. There is left perihilar increased airspace disease which may reflect atelectasis. There is no pleural effusion or pneumothorax. The heart and mediastinal contours are unremarkable.  The osseous structures are unremarkable.  IMPRESSION: Bilateral diffuse interstitial thickening and left perihilar increased airspace disease. These findings may be secondary to pulmonary edema versus bronchiolitis.   Electronically Signed   By: Kathreen Devoid   On: 07/14/2013 13:55   Dg Ercp Biliary & Pancreatic Ducts  07/14/2013    CLINICAL DATA:  ERCP: Possible choledocholithiasis  EXAM: ERCP with sphincterotomy and balloon sweep of the common bile duct  TECHNIQUE: Multiple spot images obtained with the fluoroscopic device and submitted for interpretation post-procedure.  COMPARISON:  Intraoperative cholangiogram 07/12/2013  FINDINGS: A total of 9 intraoperative spot images demonstrate an endoscope in the descending duodenum with wire cannulation of the ampulla. Retrograde opacification of the extra and intrahepatic bile ducts demonstrates dilatation of the common hepatic and common bile ducts. Minimal if any dilatation of the intrahepatic bile ducts. The images then show balloon sweeping of the extrahepatic bile ducts. No definite filling defects identified. Small filling defects are present on some of the end images but have an appearance suggesting the presence of a bubbles.  IMPRESSION: ERCP, sphincterotomy and balloon sweep of the extrahepatic bile ducts as above.  These images were submitted for radiologic interpretation only. Please see the procedural report for the amount of contrast and the fluoroscopy time utilized.   Electronically Signed   By: Jacqulynn Cadet M.D.   On: 07/14/2013 17:05   ERCP ENDOSCOPIC IMPRESSION:  Esophagus not seen well. Stomach and duodenum normal.  Papilla normal, draining bile. Easily cannulated using wire-guided  technique.  Cholangiogram showed a dilated CBD of 12-13 mm, CHD same.  Intrahepatics midly dilated. s/p cholecyetectomy.  Biliary sphincterotomy performed given suspicion of stones.  No stones seen - balloon sweeps performed - no stones out.  Occlusion cholangiogram  negative. Air bubbled were noted at times    Assessment / Plan:    1. Gallstone pancreatitis, improving. Hungry. Will try low fat diet. Maybe home later today. LFTs up overnight (post -ERCP). Lipase normal.   2. Abnormal IOC. Dilated bile ducts. ERCP with sphincterotomy  / balloon sweep yesterday. No stones seen.     3. Hypoxia / cough during ERCP. CXR with pulmonary edema. Good response to IV lasix. O2 sat 98%. She is on room air.  4. Constipation, acute on chronic. Will give dose of miralax    LOS: 6 days   Willia Craze  07/15/2013, 9:49 AM

## 2013-07-15 NOTE — Discharge Summary (Signed)
Physician Discharge Summary  Patient ID: Maria Schneider MRN: [id number] DOB/AGE: January 22, 1946 68 y.o.  Admit date: 07/09/2013 Discharge date: 07/15/2013  Admission Diagnoses:  1.Gall stone pancreatitis  3. Hypertension  4. GERD  5. Tobacco use for 34 year, quit age 67  5. Mild alcohol use.  6. Hx of depression  7. B12 and Vitamin D deficiencey  8. Hx of asthma   Discharge Diagnoses:  1.Gall stone pancreatitis  2. Post op fluid overload 3. Hypertension  4. GERD  5. Tobacco use for 34 year, quit age 59  5. Mild alcohol use.  6. Hx of depression  7. B12 and Vitamin D deficiencey  8. Hx of asthma  Principal Problem:   Gallstone pancreatitis Active Problems:   HYPERTENSION   GERD   Gall stone pancreatitis   Abnormal cholangiogram - CBD stones suspected   PROCEDURES:  1. LAPAROSCOPIC CHOLECYSTECTOMY WITH INTRAOPERATIVE CHOLANGIOGRAM, Edward Jolly, MD, 07/12/2013. IOC showed several small distal CBD stones  2.  ERCP with sphincterotomy/papillotomy, 07/14/2013, Gatha Mayer, MD.    Hospital Course:  Maria Schneider is a 68 y.o. female with PMh of HTN, GERD, Depression presents to the ER today with the above complaint.  Her symptoms started with Abd pain, epigastric 10days ago associated with dark urine, she saw Dr.Fry and was started on Abx for UTI.  Despite this continued to have worsening pain, and then vomiting and diarrhea over the weekend.  This vomiting and diarrhea subsided after 1 day but due to worsening pain went to Dr.Fry's office yesterday and had lab work done which showed abnormal LFTs and lipase, bili.  Was asked to come back for repeat labs today but due to worsening symptoms presented to the ER today after d/w Dr.Fry's nurse. She was admitted by Medicine service. In ER, noted to have Gallstone pancreatitis, GI consulted will see pt in am She was placed on bowel rest and hydration.  She improved and had surgery on 07/12/13.   Findings:  Edematous  gallbladder with chronic cholecystitis and apparent small distal common bile duct stones, on IOC.  She was seen post op by Dr. Henrene Pastor, GI service.   She then underwent ERCP as described above by Dr. Carlean Purl on 4/27.  She had some hypoxia and was treated with lasix.  She had a good diuresis (4.6 liters), but was still reading sats in the upper 80's at rest with sat monitor. Repeat CXR shows improved pulmonary infiltrates, some atelectasis, and some small effusions bilaterally.  She is sating 94-95% with walking off O2, now.  She is hard to get accurate saturation levels on.  I am going to let her go home and follow up in 2-3 weeks with our office and her PCP.  CONDITION ON D/C:  Improved   Disposition: Final discharge disposition not confirmed   Future Appointments Provider Department Dept Phone   08/05/2013 3:00 PM [facility] [place] [facility] [place] [phone]   08/14/2013 10:00 AM [facility] Nurse [place] [phone]   08/19/2013 9:00 AM Laurey Morale, MD [place] [facility] [place]       Medication List         acetaminophen 325 MG tablet  Commonly known as:  TYLENOL  Take 2 tablets (650 mg total) by mouth every 6 (six) hours as needed for mild pain (or Fever >/= 101  do not take more than 4000 mg of Tylenol (acetaminophen) per 24 hours.).  buPROPion 300 MG 24 hr tablet  Commonly known as:  WELLBUTRIN XL  Take 300 mg by mouth daily.     cholecalciferol 1000 UNITS tablet  Commonly known as:  VITAMIN D  Take 2,000 Units by mouth daily.     cyclobenzaprine 10 MG tablet  Commonly known as:  FLEXERIL  Take 1 tablet (10 mg total) by mouth 3 (three) times daily as needed for muscle spasms.     diclofenac 75 MG EC tablet  Commonly known as:  VOLTAREN  TAKE 1 TABLET (75 MG TOTAL) BY MOUTH 2 (TWO) TIMES DAILY AS NEEDED (PAIN ).     fenofibrate 160 MG tablet  TAKE 1 TABLET EVERY DAY     lansoprazole 30 MG capsule  Commonly  known as:  PREVACID  Take 1 capsule (30 mg total) by mouth daily at 12 noon.     methylcellulose oral powder  Commonly known as:  CITRUCEL  Take 1 packet by mouth at bedtime.     metoprolol succinate 50 MG 24 hr tablet  Commonly known as:  TOPROL-XL  TAKE 1 TABLET EVERY DAY     naproxen 250 MG tablet  Commonly known as:  NAPROSYN  Take 500 mg by mouth 2 (two) times daily as needed (pain).     oxyCODONE-acetaminophen 5-325 MG per tablet  Commonly known as:  PERCOCET/ROXICET  Take 1-2 tablets by mouth every 4 (four) hours as needed for moderate pain (This has tylenol in it so you have to count it for your daily total.).     polyethylene glycol packet  Commonly known as:  MIRALAX / GLYCOLAX  Take 17 g by mouth daily as needed (use as needed for acute constipation.).     promethazine 25 MG tablet  Commonly known as:  PHENERGAN  Take 1 tablet (25 mg total) by mouth every 4 (four) hours as needed for nausea or vomiting.     sertraline 100 MG tablet  Commonly known as:  ZOLOFT  Take 100 mg by mouth daily.     ZETIA 10 MG tablet  Generic drug:  ezetimibe  TAKE 1 TABLET BY MOUTH EVERY DAY           Follow-up Information   Follow up with [facility] [place] On 08/05/2013. (Your appointment is at 3 PM, be there at 2:30 for check in.)    Contact information:   [place] [address] [phone]       Follow up with Laurey Morale, MD. Schedule an appointment as soon as possible for a visit in 2 weeks. (Call and let him know what happened and follow up with you in 2-3 weeks.)    Specialty:  Family Medicine   Contact information:   [place] [address] [phone]       Signed: Earnstine Regal 07/15/2013, 3:23 PM

## 2013-07-15 NOTE — Care Management Note (Signed)
    Page 1 of 1   07/15/2013     4:05:31 PM CARE MANAGEMENT NOTE 07/15/2013  Patient:  Maria Schneider, Maria Schneider   Account Number:  [id number]  Date Initiated:  07/15/2013  Documentation initiated by:  Sunday Spillers  Subjective/Objective Assessment:   68 yo female admitted with gallstone pancreatitis.     Action/Plan:   Home when stable   Anticipated DC Date:  07/15/2013   Anticipated DC Plan:  [place]  CM consult      Choice offered to / List presented to:             Status of service:  Completed, signed off Medicare Important Message given?  NA - LOS <3 / Initial given by admissions (If response is "NO", the following Medicare IM given date fields will be blank) Date Medicare IM given:   Date Additional Medicare IM given:  07/15/2013  Discharge Disposition:  HOME/SELF CARE  Per UR Regulation:  Reviewed for med. necessity/level of care/duration of stay  If discussed at [place] of Stay Meetings, dates discussed:    Comments:

## 2013-07-15 NOTE — Progress Notes (Signed)
1 Day Post-Op  Subjective: Feels better sats still down.  Eating baked chicken. She says she aspirated some yesterday with ERCP, BS down on right.  Bedside monitor is still reading in the mid 80's. Objective: Vital signs in last 24 hours: Temp:  [98 F (36.7 C)-98.5 F (36.9 C)] 98.5 F (36.9 C) (04/28 6237) Resp:  [14-24] 18 (04/28 0619) BP: (51-215)/(38-113) 154/83 mmHg (04/28 0619) SpO2:  [89 %-99 %] 99 % (04/28 0912) FiO2 (%):  [28 %] 28 % (04/27 1157) Last BM Date: 07/14/13 580 PO yesterday recorded. 4.6 liter diuresis Afebrile, VSS BP up some Transaminase up, but otherwise OK Intake/Output from previous day: 04/27 0701 - 04/28 0700 In: 3880 [P.O.:580; I.V.:3300] Out: 4600 [Urine:4600] Intake/Output this shift: Total I/O In: 653.3 [P.O.:240; I.V.:413.3] Out: -   General appearance: alert, cooperative and no distress Resp: BS down right base GI: soft sore, but sites look good.  Lab Results:   Recent Labs  07/13/13 0500 07/15/13 0400  WBC 3.5* 3.5*  HGB 9.5* 9.9*  HCT 29.7* 32.2*  PLT 146* 207    BMET  Recent Labs  07/13/13 0500 07/15/13 0400  NA 140 142  K 3.9 3.8  CL 103 104  CO2 25 27  GLUCOSE 176* 145*  BUN 4* 4*  CREATININE 0.60 0.65  CALCIUM 9.0 8.9   PT/INR No results found for this basename: LABPROT, INR,  in the last 72 hours   Recent Labs Lab 07/09/13 1550 07/10/13 0421 07/11/13 0405 07/13/13 0500 07/15/13 0400  AST 173* 97* 49* 35 149*  ALT 152* 109* 73* 45* 173*  ALKPHOS 126* 100 96 89 267*  BILITOT 4.3* 1.1 0.9 0.5 0.5  PROT 7.2 5.7* 6.1 6.2 6.1  ALBUMIN 4.0 3.1* 3.1* 2.9* 2.9*     Lipase     Component Value Date/Time   LIPASE 55 07/15/2013 0400     Studies/Results: Dg Chest Port 1 View  07/14/2013   CLINICAL DATA:  Dyspnea  EXAM: PORTABLE CHEST - 1 VIEW  COMPARISON:  DG CHEST 2 VIEW dated 07/09/2013  FINDINGS: There is bilateral diffuse interstitial thickening and mild peribronchial cuffing. There is left perihilar  increased airspace disease which may reflect atelectasis. There is no pleural effusion or pneumothorax. The heart and mediastinal contours are unremarkable.  The osseous structures are unremarkable.  IMPRESSION: Bilateral diffuse interstitial thickening and left perihilar increased airspace disease. These findings may be secondary to pulmonary edema versus bronchiolitis.   Electronically Signed   By: Kathreen Devoid   On: 07/14/2013 13:55   Dg Ercp Biliary & Pancreatic Ducts  07/14/2013   CLINICAL DATA:  ERCP: Possible choledocholithiasis  EXAM: ERCP with sphincterotomy and balloon sweep of the common bile duct  TECHNIQUE: Multiple spot images obtained with the fluoroscopic device and submitted for interpretation post-procedure.  COMPARISON:  Intraoperative cholangiogram 07/12/2013  FINDINGS: A total of 9 intraoperative spot images demonstrate an endoscope in the descending duodenum with wire cannulation of the ampulla. Retrograde opacification of the extra and intrahepatic bile ducts demonstrates dilatation of the common hepatic and common bile ducts. Minimal if any dilatation of the intrahepatic bile ducts. The images then show balloon sweeping of the extrahepatic bile ducts. No definite filling defects identified. Small filling defects are present on some of the end images but have an appearance suggesting the presence of a bubbles.  IMPRESSION: ERCP, sphincterotomy and balloon sweep of the extrahepatic bile ducts as above.  These images were submitted for radiologic interpretation only. Please see  the procedural report for the amount of contrast and the fluoroscopy time utilized.   Electronically Signed   By: Jacqulynn Cadet M.D.   On: 07/14/2013 17:05    Medications: . albuterol  2.5 mg Nebulization TID  . buPROPion  300 mg Oral Daily  . enoxaparin (LOVENOX) injection  40 mg Subcutaneous Q24H  . ezetimibe  10 mg Oral Daily  . metoprolol succinate  50 mg Oral Daily  . pantoprazole (PROTONIX) IV  40 mg  Intravenous Daily  . polyethylene glycol  17 g Oral Daily  . sertraline  100 mg Oral Daily    Assessment/Plan  1.Gall stone pancreatitis 2.  LAPAROSCOPIC CHOLECYSTECTOMY WITH INTRAOPERATIVE CHOLANGIOGRAM, Edward Jolly, MD, 07/12/2013.  IOC showed several small distal CBD stones ERCP with sphincterotomy/papillotomy, 07/14/2013, Gatha Mayer, MD. 3. Hypertension  4. GERD  5. Tobacco use for 36 year, quit age 83  5. Mild alcohol use.  6. Hx of depression  7. B12 and Vitamin D deficiencey  8. Hx of asthma   Plan:  Sat monitor showing sats down to 85%, I will get her up and walk her, check sats walking.  I have ordered an xray, she is a prior smoker.  If sats not back up will ask medicine to see again.  Stop IV fluids and saline lock IV.  Sats up in the 90's with walking.  CXR OK.  Plan to send home today.  LOS: 6 days    Maria Schneider 07/15/2013

## 2013-07-15 NOTE — Progress Notes (Signed)
General Surgery [facility]  This afternoon patient is doing better.  Maintaining sat's.  Ambulated without shortness of breath.  Husband at bedside.  Plan discharge to home today.  Will arrange follow up at [place] office.  Encouraged patient to discuss pulmonary status with primary MD.  May need pulmonary evaluation.  Earnstine Regal, MD, [facility] Office: [phone]

## 2013-07-15 NOTE — Progress Notes (Signed)
Seems to be doing better. Decreased rales posteriorly. Oxygen saturations have been good off oxygen. Currently receiving a nebulizer treatment.  Good diuresis overnight.  A/P: postoperative pulmonary edema, improved.

## 2013-07-16 ENCOUNTER — Telehealth (INDEPENDENT_AMBULATORY_CARE_PROVIDER_SITE_OTHER): Payer: Self-pay

## 2013-07-16 DIAGNOSIS — R5082 Postprocedural fever: Secondary | ICD-10-CM

## 2013-07-16 DIAGNOSIS — Z9049 Acquired absence of other specified parts of digestive tract: Secondary | ICD-10-CM

## 2013-07-16 NOTE — Progress Notes (Signed)
Agree w/ Ms. Guenther 

## 2013-07-16 NOTE — Telephone Encounter (Signed)
Delayed entry:  Called and left message for patient to call our office to make aware to go to [facility] for STAT labs and CXR.

## 2013-07-28 ENCOUNTER — Encounter: Payer: Self-pay | Admitting: Family Medicine

## 2013-07-28 ENCOUNTER — Ambulatory Visit (INDEPENDENT_AMBULATORY_CARE_PROVIDER_SITE_OTHER): Payer: Medicare Other | Admitting: Family Medicine

## 2013-07-28 VITALS — BP 118/81 | HR 65 | Temp 98.3°F | Ht 65.0 in | Wt 164.0 lb

## 2013-07-28 DIAGNOSIS — K859 Acute pancreatitis without necrosis or infection, unspecified: Secondary | ICD-10-CM

## 2013-07-28 DIAGNOSIS — K851 Biliary acute pancreatitis without necrosis or infection: Secondary | ICD-10-CM

## 2013-07-28 NOTE — Progress Notes (Signed)
   Subjective:    Patient ID: Maria Schneider, female    DOB: 22-Oct-1945, 68 y.o.   MRN: [id number]  HPI Here to follow up a hospital stay from 07-09-13 to 07-15-13 for gallstone pancreatitis. She presented with abdominal pain and vomiting with quite elevated lipase and amylase. She had a lap cholecystectomy on 07-12-13 and then an ERCP with sphincterotomy on 07-14-13. She has done well since going home, no abdominal pain or nausea or fever. Her appetite is still down a bit but she takes oral food well. BMs are normal.    Review of Systems  Constitutional: Negative.   Gastrointestinal: Negative.        Objective:   Physical Exam  Constitutional: She appears well-developed and well-nourished. No distress.  Cardiovascular: Normal rate, regular rhythm, normal heart sounds and intact distal pulses.   Pulmonary/Chest: Effort normal and breath sounds normal.  Abdominal: Soft. Bowel sounds are normal. She exhibits no distension and no mass. There is no tenderness. There is no rebound and no guarding.  Her wounds are clean and healing well          Assessment & Plan:  She is recovering as expected. She will see Surgery on 08-05-13.

## 2013-07-28 NOTE — Progress Notes (Signed)
Pre visit review using our clinic review tool, if applicable. No additional management support is needed unless otherwise documented below in the visit note. 

## 2013-08-05 ENCOUNTER — Encounter (INDEPENDENT_AMBULATORY_CARE_PROVIDER_SITE_OTHER): Payer: Self-pay

## 2013-08-05 ENCOUNTER — Ambulatory Visit (INDEPENDENT_AMBULATORY_CARE_PROVIDER_SITE_OTHER): Payer: BC Managed Care – PPO | Admitting: General Surgery

## 2013-08-05 VITALS — BP 122/72 | HR 82 | Temp 98.0°F | Resp 18 | Ht 65.0 in | Wt 163.0 lb

## 2013-08-05 DIAGNOSIS — K851 Biliary acute pancreatitis without necrosis or infection: Secondary | ICD-10-CM

## 2013-08-05 DIAGNOSIS — K859 Acute pancreatitis without necrosis or infection, unspecified: Secondary | ICD-10-CM

## 2013-08-05 NOTE — Patient Instructions (Signed)
You can return if you have any issues, but otherwise you need not return.  I would use fiber as we discussed for your constipation issues.

## 2013-08-05 NOTE — Progress Notes (Signed)
Maria Schneider Aug 09, 1945 [id number] 08/05/2013   Maria Schneider is a 68 y.o. female who had a laparoscopic cholecystectomy with intraoperative cholangiogram by Dr. Excell Seltzer.  The pathology report confirmed :   Gallbladder - CHRONIC CHOLECYSTITIS AND CHOLELITHIASIS.   The patient reports that they are feeling well with normal bowel movements and good appetite.  The pre-operative symptoms of abdominal pain, nausea, and vomiting have resolved.    Physical examination - Incisions appear well-healed with no sign of infection or bleeding.   Abdomen - soft, non-tender Chest:  Clear, no further SOB   PCP:FRY,STEPHEN A, MD Impression:  s/p laparoscopic cholecystectomy 1.Gall stone pancreatitis  2. LAPAROSCOPIC CHOLECYSTECTOMY WITH INTRAOPERATIVE CHOLANGIOGRAM, Edward Jolly, MD, 07/12/2013. IOC showed several small distal CBD stones  ERCP with sphincterotomy/papillotomy, 07/14/2013, Gatha Mayer, MD.  3. Hypertension  4. GERD  5. Tobacco use for 36 year, quit age 77  5. Mild alcohol use.  6. Hx of depression  7. B12 and Vitamin D deficiencey  8. Hx of asthma  Plan:  She may resume a regular diet and full activity.  She may follow-up on a PRN basis.

## 2013-08-19 ENCOUNTER — Encounter: Payer: Self-pay | Admitting: Family Medicine

## 2013-08-19 ENCOUNTER — Ambulatory Visit (INDEPENDENT_AMBULATORY_CARE_PROVIDER_SITE_OTHER): Payer: Medicare Other | Admitting: Family Medicine

## 2013-08-19 VITALS — BP 122/94 | HR 69 | Temp 98.1°F | Ht 65.0 in | Wt 164.0 lb

## 2013-08-19 DIAGNOSIS — Z0000 Encounter for general adult medical examination without abnormal findings: Secondary | ICD-10-CM

## 2013-08-19 DIAGNOSIS — L259 Unspecified contact dermatitis, unspecified cause: Secondary | ICD-10-CM

## 2013-08-19 LAB — POCT URINALYSIS DIPSTICK
Blood, UA: NEGATIVE
GLUCOSE UA: NEGATIVE
NITRITE UA: NEGATIVE
Protein, UA: NEGATIVE
Spec Grav, UA: >=1.03
Urobilinogen, UA: 1
pH, UA: 5.5

## 2013-08-19 LAB — LIPID PANEL
CHOLESTEROL: 212 mg/dL — AB (ref 0–200)
HDL: 61.2 mg/dL (ref >39.00)
LDL Cholesterol: 113 mg/dL — ABNORMAL HIGH (ref 0–99)
Total CHOL/HDL Ratio: 3
Triglycerides: 190 mg/dL — ABNORMAL HIGH (ref 0.0–149.0)
VLDL: 38 mg/dL (ref 0.0–40.0)

## 2013-08-19 LAB — CBC WITH DIFFERENTIAL/PLATELET
BASOS PCT: 0.6 % (ref 0.0–3.0)
Basophils Absolute: 0 10*3/uL (ref 0.0–0.1)
EOS ABS: 0.1 10*3/uL (ref 0.0–0.7)
EOS PCT: 2.9 % (ref 0.0–5.0)
HCT: 37.2 % (ref 36.0–46.0)
Hemoglobin: 12.2 g/dL (ref 12.0–15.0)
Lymphocytes Relative: 21.2 % (ref 12.0–46.0)
Lymphs Abs: 0.9 10*3/uL (ref 0.7–4.0)
MCHC: 32.8 g/dL (ref 30.0–36.0)
MCV: 84.1 fl (ref 78.0–100.0)
Monocytes Absolute: 0.4 10*3/uL (ref 0.1–1.0)
Monocytes Relative: 10.3 % (ref 3.0–12.0)
NEUTROS PCT: 65 % (ref 43.0–77.0)
Neutro Abs: 2.7 10*3/uL (ref 1.4–7.7)
Platelets: 228 10*3/uL (ref 150.0–400.0)
RBC: 4.43 Mil/uL (ref 3.87–5.11)
RDW: 14.2 % (ref 11.5–15.5)
WBC: 4.2 10*3/uL (ref 4.0–10.5)

## 2013-08-19 LAB — BASIC METABOLIC PANEL
BUN: 18 mg/dL (ref 6–23)
CALCIUM: 9.7 mg/dL (ref 8.4–10.5)
CO2: 27 mEq/L (ref 19–32)
Chloride: 107 mEq/L (ref 96–112)
Creatinine, Ser: 1.2 mg/dL (ref 0.4–1.2)
GFR: 50.21 mL/min — AB (ref >60.00)
GLUCOSE: 100 mg/dL — AB (ref 70–99)
Potassium: 4.7 mEq/L (ref 3.5–5.1)
SODIUM: 142 meq/L (ref 135–145)

## 2013-08-19 LAB — HEPATIC FUNCTION PANEL
ALBUMIN: 4.3 g/dL (ref 3.5–5.2)
ALT: 17 U/L (ref 0–35)
AST: 20 U/L (ref 0–37)
Alkaline Phosphatase: 47 U/L (ref 39–117)
Bilirubin, Direct: 0.1 mg/dL (ref 0.0–0.3)
Total Bilirubin: 0.4 mg/dL (ref 0.2–1.2)
Total Protein: 7.1 g/dL (ref 6.0–8.3)

## 2013-08-19 LAB — VITAMIN B12: Vitamin B-12: 117 pg/mL — ABNORMAL LOW (ref 211–911)

## 2013-08-19 LAB — TSH: TSH: 1.87 u[IU]/mL (ref 0.35–4.50)

## 2013-08-19 MED ORDER — METHYLPREDNISOLONE ACETATE 80 MG/ML IJ SUSP
120.0000 mg | Freq: Once | INTRAMUSCULAR | Status: AC
  Administered 2013-08-19: 120 mg via INTRAMUSCULAR

## 2013-08-19 MED ORDER — EZETIMIBE 10 MG PO TABS: ORAL_TABLET | ORAL | Status: DC

## 2013-08-19 MED ORDER — METOPROLOL SUCCINATE ER 50 MG PO TB24: ORAL_TABLET | ORAL | Status: DC

## 2013-08-19 MED ORDER — FENOFIBRATE 160 MG PO TABS: ORAL_TABLET | ORAL | Status: DC

## 2013-08-19 NOTE — Progress Notes (Signed)
Pre visit review using our clinic review tool, if applicable. No additional management support is needed unless otherwise documented below in the visit note. 

## 2013-08-19 NOTE — Addendum Note (Signed)
Addended by: Aggie Hacker A on: 08/19/2013 10:45 AM   Modules accepted: Orders

## 2013-08-19 NOTE — Progress Notes (Signed)
   Subjective:    Patient ID: Maria Schneider, female    DOB: 11-20-1945, 68 y.o.   MRN: [id number]  HPI 68 yr old female for a cpx. She has totally recovered from her gall bladder surgery. Her only complaint is poison ivy rashes which came up last weekend. These cover her neck, arms, and trunk and they itch a lot.    Review of Systems  Constitutional: Negative.   HENT: Negative.   Eyes: Negative.   Respiratory: Negative.   Cardiovascular: Negative.   Gastrointestinal: Negative.   Genitourinary: Negative for dysuria, urgency, frequency, hematuria, flank pain, decreased urine volume, enuresis, difficulty urinating, pelvic pain and dyspareunia.  Musculoskeletal: Negative.   Skin: Negative.   Neurological: Negative.   Psychiatric/Behavioral: Negative.        Objective:   Physical Exam  Constitutional: She is oriented to person, place, and time. She appears well-developed and well-nourished. No distress.  HENT:  Head: Normocephalic and atraumatic.  Right Ear: External ear normal.  Left Ear: External ear normal.  Nose: Nose normal.  Mouth/Throat: Oropharynx is clear and moist. No oropharyngeal exudate.  Eyes: Conjunctivae and EOM are normal. Pupils are equal, round, and reactive to light. No scleral icterus.  Neck: Normal range of motion. Neck supple. No JVD present. No thyromegaly present.  Cardiovascular: Normal rate, regular rhythm, normal heart sounds and intact distal pulses.  Exam reveals no gallop and no friction rub.   No murmur heard. Pulmonary/Chest: Effort normal and breath sounds normal. No respiratory distress. She has no wheezes. She has no rales. She exhibits no tenderness.  Abdominal: Soft. Bowel sounds are normal. She exhibits no distension and no mass. There is no tenderness. There is no rebound and no guarding.  Musculoskeletal: Normal range of motion. She exhibits no edema and no tenderness.  Lymphadenopathy:    She has no cervical adenopathy.  Neurological: She  is alert and oriented to person, place, and time. She has normal reflexes. No cranial nerve deficit. She exhibits normal muscle tone. Coordination normal.  Skin: Skin is warm and dry. Rash noted. No erythema.  Scattered red maculovesicular patches   Psychiatric: She has a normal mood and affect. Her behavior is normal. Judgment and thought content normal.          Assessment & Plan:  Well exam. Given a steroid shot. Get fasting labs

## 2013-08-20 ENCOUNTER — Encounter: Payer: Self-pay | Admitting: Family Medicine

## 2013-08-20 LAB — VITAMIN D 25 HYDROXY (VIT D DEFICIENCY, FRACTURES): VIT D 25 HYDROXY: 28 ng/mL — AB (ref 30–89)

## 2013-08-20 MED ORDER — METHYLPREDNISOLONE 4 MG PO KIT: PACK | ORAL | Status: DC

## 2013-08-20 NOTE — Telephone Encounter (Signed)
We will follow the shot with a taper of oral steroids. Call in a Medrol dose pack

## 2013-08-25 ENCOUNTER — Telehealth: Payer: Self-pay | Admitting: Family Medicine

## 2013-08-25 NOTE — Telephone Encounter (Signed)
I spoke with pt about recent lab results. Pt needs to schedule a Vitamin B 12 injection, can you call pt to schedule this?

## 2013-08-25 NOTE — Telephone Encounter (Signed)
Pt has been sch for 08-26-13

## 2013-08-26 ENCOUNTER — Ambulatory Visit (INDEPENDENT_AMBULATORY_CARE_PROVIDER_SITE_OTHER): Payer: Medicare Other | Admitting: Family Medicine

## 2013-08-26 DIAGNOSIS — E538 Deficiency of other specified B group vitamins: Secondary | ICD-10-CM

## 2013-08-26 MED ORDER — CYANOCOBALAMIN 1000 MCG/ML IJ SOLN
1000.0000 ug | Freq: Once | INTRAMUSCULAR | Status: AC
  Administered 2013-08-26: 1000 ug via INTRAMUSCULAR

## 2013-08-28 ENCOUNTER — Encounter: Payer: Self-pay | Admitting: Family Medicine

## 2013-08-29 MED ORDER — METHYLPREDNISOLONE 4 MG PO KIT: PACK | ORAL | Status: DC

## 2013-08-29 NOTE — Telephone Encounter (Signed)
Call in a Medrol dose pack  

## 2013-09-02 ENCOUNTER — Ambulatory Visit (INDEPENDENT_AMBULATORY_CARE_PROVIDER_SITE_OTHER): Payer: Medicare Other | Admitting: Family Medicine

## 2013-09-02 DIAGNOSIS — E538 Deficiency of other specified B group vitamins: Secondary | ICD-10-CM

## 2013-09-02 MED ORDER — CYANOCOBALAMIN 1000 MCG/ML IJ SOLN
1000.0000 ug | Freq: Once | INTRAMUSCULAR | Status: AC
  Administered 2013-09-02: 1000 ug via INTRAMUSCULAR

## 2013-09-09 ENCOUNTER — Ambulatory Visit (INDEPENDENT_AMBULATORY_CARE_PROVIDER_SITE_OTHER): Payer: Medicare Other | Admitting: Family Medicine

## 2013-09-09 DIAGNOSIS — E538 Deficiency of other specified B group vitamins: Secondary | ICD-10-CM

## 2013-09-09 MED ORDER — CYANOCOBALAMIN 1000 MCG/ML IJ SOLN
1000.0000 ug | Freq: Once | INTRAMUSCULAR | Status: AC
  Administered 2013-09-09: 1000 ug via INTRAMUSCULAR

## 2013-09-16 ENCOUNTER — Ambulatory Visit (INDEPENDENT_AMBULATORY_CARE_PROVIDER_SITE_OTHER): Payer: Medicare Other | Admitting: Family Medicine

## 2013-09-16 DIAGNOSIS — E538 Deficiency of other specified B group vitamins: Secondary | ICD-10-CM

## 2013-09-16 MED ORDER — CYANOCOBALAMIN 1000 MCG/ML IJ SOLN
1000.0000 ug | Freq: Once | INTRAMUSCULAR | Status: AC
  Administered 2013-09-16: 1000 ug via INTRAMUSCULAR

## 2013-09-23 ENCOUNTER — Ambulatory Visit: Payer: Medicare Other | Admitting: Family Medicine

## 2013-09-24 ENCOUNTER — Ambulatory Visit (INDEPENDENT_AMBULATORY_CARE_PROVIDER_SITE_OTHER): Payer: Medicare Other | Admitting: Family Medicine

## 2013-09-24 DIAGNOSIS — E538 Deficiency of other specified B group vitamins: Secondary | ICD-10-CM

## 2013-09-24 MED ORDER — CYANOCOBALAMIN 1000 MCG/ML IJ SOLN
1000.0000 ug | Freq: Once | INTRAMUSCULAR | Status: AC
  Administered 2013-09-24: 1000 ug via INTRAMUSCULAR

## 2013-09-30 ENCOUNTER — Ambulatory Visit (INDEPENDENT_AMBULATORY_CARE_PROVIDER_SITE_OTHER): Payer: Medicare Other | Admitting: Family Medicine

## 2013-09-30 DIAGNOSIS — E538 Deficiency of other specified B group vitamins: Secondary | ICD-10-CM

## 2013-09-30 MED ORDER — CYANOCOBALAMIN 1000 MCG/ML IJ SOLN
1000.0000 ug | Freq: Once | INTRAMUSCULAR | Status: AC
  Administered 2013-09-30: 1000 ug via INTRAMUSCULAR

## 2013-10-07 ENCOUNTER — Ambulatory Visit (INDEPENDENT_AMBULATORY_CARE_PROVIDER_SITE_OTHER): Payer: Medicare Other | Admitting: Family Medicine

## 2013-10-07 DIAGNOSIS — E538 Deficiency of other specified B group vitamins: Secondary | ICD-10-CM

## 2013-10-07 MED ORDER — CYANOCOBALAMIN 1000 MCG/ML IJ SOLN
1000.0000 ug | Freq: Once | INTRAMUSCULAR | Status: AC
  Administered 2013-10-07: 1000 ug via INTRAMUSCULAR

## 2013-10-14 ENCOUNTER — Ambulatory Visit (INDEPENDENT_AMBULATORY_CARE_PROVIDER_SITE_OTHER): Payer: Medicare Other | Admitting: Family Medicine

## 2013-10-14 DIAGNOSIS — E538 Deficiency of other specified B group vitamins: Secondary | ICD-10-CM

## 2013-10-14 MED ORDER — CYANOCOBALAMIN 1000 MCG/ML IJ SOLN
1000.0000 ug | Freq: Once | INTRAMUSCULAR | Status: AC
  Administered 2013-10-14: 1000 ug via INTRAMUSCULAR

## 2013-10-21 ENCOUNTER — Ambulatory Visit (INDEPENDENT_AMBULATORY_CARE_PROVIDER_SITE_OTHER): Payer: Medicare Other | Admitting: Family Medicine

## 2013-10-21 DIAGNOSIS — E538 Deficiency of other specified B group vitamins: Secondary | ICD-10-CM

## 2013-10-21 MED ORDER — CYANOCOBALAMIN 1000 MCG/ML IJ SOLN
1000.0000 ug | Freq: Once | INTRAMUSCULAR | Status: AC
  Administered 2013-10-21: 1000 ug via INTRAMUSCULAR

## 2013-10-28 ENCOUNTER — Ambulatory Visit (INDEPENDENT_AMBULATORY_CARE_PROVIDER_SITE_OTHER): Payer: Medicare Other | Admitting: Family Medicine

## 2013-10-28 DIAGNOSIS — E538 Deficiency of other specified B group vitamins: Secondary | ICD-10-CM

## 2013-10-28 MED ORDER — CYANOCOBALAMIN 1000 MCG/ML IJ SOLN
1000.0000 ug | Freq: Once | INTRAMUSCULAR | Status: AC
  Administered 2013-10-28: 1000 ug via INTRAMUSCULAR

## 2013-11-04 ENCOUNTER — Ambulatory Visit: Payer: Medicare Other | Admitting: Family Medicine

## 2013-11-11 ENCOUNTER — Ambulatory Visit (INDEPENDENT_AMBULATORY_CARE_PROVIDER_SITE_OTHER): Payer: Medicare Other | Admitting: Family Medicine

## 2013-11-11 DIAGNOSIS — E538 Deficiency of other specified B group vitamins: Secondary | ICD-10-CM

## 2013-11-11 MED ORDER — CYANOCOBALAMIN 1000 MCG/ML IJ SOLN
1000.0000 ug | Freq: Once | INTRAMUSCULAR | Status: AC
Start: 2013-11-11 — End: 2013-11-11
  Administered 2013-11-11: 1000 ug via INTRAMUSCULAR

## 2013-11-18 ENCOUNTER — Ambulatory Visit (INDEPENDENT_AMBULATORY_CARE_PROVIDER_SITE_OTHER): Payer: Medicare Other | Admitting: Family Medicine

## 2013-11-18 DIAGNOSIS — E538 Deficiency of other specified B group vitamins: Secondary | ICD-10-CM

## 2013-11-18 MED ORDER — CYANOCOBALAMIN 1000 MCG/ML IJ SOLN
1000.0000 ug | Freq: Once | INTRAMUSCULAR | Status: AC
  Administered 2013-11-18: 1000 ug via INTRAMUSCULAR

## 2013-12-24 ENCOUNTER — Ambulatory Visit (INDEPENDENT_AMBULATORY_CARE_PROVIDER_SITE_OTHER): Payer: Medicare Other | Admitting: Family Medicine

## 2013-12-24 DIAGNOSIS — E538 Deficiency of other specified B group vitamins: Secondary | ICD-10-CM

## 2013-12-24 DIAGNOSIS — Z23 Encounter for immunization: Secondary | ICD-10-CM

## 2013-12-24 MED ORDER — CYANOCOBALAMIN 1000 MCG/ML IJ SOLN
1000.0000 ug | Freq: Once | INTRAMUSCULAR | Status: AC
  Administered 2013-12-24: 1000 ug via INTRAMUSCULAR

## 2013-12-26 ENCOUNTER — Other Ambulatory Visit: Payer: Self-pay | Admitting: Dermatology

## 2014-01-01 ENCOUNTER — Ambulatory Visit (INDEPENDENT_AMBULATORY_CARE_PROVIDER_SITE_OTHER): Payer: Self-pay | Admitting: *Deleted

## 2014-01-01 ENCOUNTER — Telehealth: Payer: Self-pay | Admitting: Family Medicine

## 2014-01-01 DIAGNOSIS — Z006 Encounter for examination for normal comparison and control in clinical research program: Secondary | ICD-10-CM

## 2014-01-01 NOTE — Progress Notes (Signed)
Patient came in the office today for a urology EKG. EKG reviewed by Dr Caryl Comes and heart rate 45. Spoke with patient and she is not currently having any dizziness, lightheadedness, or symptoms. Today's EKG was a baseline EKG to begin urology drug study. Spoke with [facility] nurse at Dr Barbie Banner office and will have patient decrease her Toprol 50 mg to 1/2 tablet at bedtime (currently takes at bedtime) and to follow up with him in 1 week. Advised patient, verbalized understanding. Again patient stated she felt fine.

## 2014-01-01 NOTE — Telephone Encounter (Signed)
We got a call from Dr. Danie Chandler office, pt is there having a EKG for urology before starting on a new medication. Pt's heart rate is 45 right now, pt stated that she is not having any symptoms right now. Per Dr. Sarajane Jews, pt should take 1/2 tablet of the Metoprolol 50 mg once a day and follow up with [place] in 1 week. I spoke with nurse there at that office and she is going to give pt this new advise. I also made this change in pt's chart.

## 2014-01-07 ENCOUNTER — Encounter: Payer: Self-pay | Admitting: Family Medicine

## 2014-01-07 ENCOUNTER — Ambulatory Visit (INDEPENDENT_AMBULATORY_CARE_PROVIDER_SITE_OTHER): Payer: Medicare Other | Admitting: Family Medicine

## 2014-01-07 VITALS — BP 144/87 | HR 61 | Temp 98.1°F | Ht 65.0 in | Wt 165.0 lb

## 2014-01-07 DIAGNOSIS — R001 Bradycardia, unspecified: Secondary | ICD-10-CM

## 2014-01-07 DIAGNOSIS — I10 Essential (primary) hypertension: Secondary | ICD-10-CM

## 2014-01-07 DIAGNOSIS — E538 Deficiency of other specified B group vitamins: Secondary | ICD-10-CM

## 2014-01-07 LAB — VITAMIN B12: VITAMIN B 12: 397 pg/mL (ref 211–911)

## 2014-01-07 MED ORDER — METOPROLOL SUCCINATE ER 25 MG PO TB24: 25.0000 mg | ORAL_TABLET | Freq: Every day | ORAL | Status: DC

## 2014-01-07 NOTE — Progress Notes (Signed)
Pre visit review using our clinic review tool, if applicable. No additional management support is needed unless otherwise documented below in the visit note. 

## 2014-01-07 NOTE — Progress Notes (Signed)
   Subjective:    Patient ID: Maria Schneider, female    DOB: Aug 05, 1945, 68 y.o.   MRN: [id number]  HPI Here to follow up on bradycardia and HTN. She has been enrolled in a research protocol through the urology office, and part of this was to have a baseline EKG done at the [facility] office last week. That day she felt fine but the EKG showed a sinus bradycardia with a rate of 42. She was told to contact us, and when she did we advised her to cut her metoprolol dose to 1/2 a tablet a day. She has done this for the past week and she feels fine. Her rate is 61 today as usual but her systolic pressure is up slightly to 144. She has also been getting B12 shots for 12 weeks and is due for another level check.    Review of Systems  Constitutional: Negative.   Respiratory: Negative.   Cardiovascular: Negative.   Neurological: Negative.        Objective:   Physical Exam  Constitutional: She appears well-developed and well-nourished.  Cardiovascular: Normal rate, regular rhythm, normal heart sounds and intact distal pulses.   Pulmonary/Chest: Effort normal and breath sounds normal.          Assessment & Plan:  We will keep her metoprolol dose at 25 mg daily. She will follow her pulse rates and BP at home and report her findings to [place] in one month. Check a B12 level today

## 2014-01-08 ENCOUNTER — Telehealth: Payer: Self-pay | Admitting: Family Medicine

## 2014-01-08 NOTE — Telephone Encounter (Signed)
emmi emailed °

## 2014-01-13 ENCOUNTER — Encounter (INDEPENDENT_AMBULATORY_CARE_PROVIDER_SITE_OTHER): Payer: Self-pay

## 2014-01-13 DIAGNOSIS — Z006 Encounter for examination for normal comparison and control in clinical research program: Secondary | ICD-10-CM

## 2014-01-19 ENCOUNTER — Encounter: Payer: Self-pay | Admitting: Family Medicine

## 2014-04-22 ENCOUNTER — Encounter: Payer: Self-pay | Admitting: Family Medicine

## 2014-04-22 ENCOUNTER — Ambulatory Visit (INDEPENDENT_AMBULATORY_CARE_PROVIDER_SITE_OTHER): Payer: Medicare Other | Admitting: Family Medicine

## 2014-04-22 VITALS — BP 137/82 | HR 61 | Temp 98.3°F | Ht 65.0 in | Wt 166.0 lb

## 2014-04-22 DIAGNOSIS — I10 Essential (primary) hypertension: Secondary | ICD-10-CM

## 2014-04-22 NOTE — Progress Notes (Signed)
   Subjective:    Patient ID: Maria Schneider, female    DOB: [birth year], 69 y.o.   MRN: [id number]  HPI Here with concerns about her BP. She feels great and she has been very happy with her current dose of metoprolol. However a month ago she purchased a BP cuff for the first time and started to check her pressures. This is a wrist cuff, not an arm cuff. She has been getting readings of 150s and 160s over 100 and she is alarmed.    Review of Systems  Constitutional: Negative.   Respiratory: Negative.   Cardiovascular: Negative.        Objective:   Physical Exam  Constitutional: She appears well-developed and well-nourished.  Cardiovascular: Normal rate, regular rhythm, normal heart sounds and intact distal pulses.   Pulmonary/Chest: Effort normal and breath sounds normal.          Assessment & Plan:  Her BP here today is normal, so I think her readings at home have been inaccurate. I advised her to get rid of the wrist cuff and to buy an arm cuff. She will contact us later this week week with a new set of readings.

## 2014-04-22 NOTE — Progress Notes (Signed)
Pre visit review using our clinic review tool, if applicable. No additional management support is needed unless otherwise documented below in the visit note. 

## 2014-04-26 ENCOUNTER — Encounter: Payer: Self-pay | Admitting: Family Medicine

## 2014-04-27 NOTE — Telephone Encounter (Signed)
noted 

## 2014-06-24 ENCOUNTER — Encounter: Payer: Self-pay | Admitting: Family Medicine

## 2014-06-24 ENCOUNTER — Ambulatory Visit (INDEPENDENT_AMBULATORY_CARE_PROVIDER_SITE_OTHER): Payer: Medicare Other | Admitting: Family Medicine

## 2014-06-24 VITALS — BP 156/91 | HR 62 | Temp 98.3°F | Ht 65.0 in | Wt 168.0 lb

## 2014-06-24 DIAGNOSIS — M25551 Pain in right hip: Secondary | ICD-10-CM | POA: Diagnosis not present

## 2014-06-24 MED ORDER — TRAMADOL HCL 50 MG PO TABS: 50.0000 mg | ORAL_TABLET | Freq: Four times a day (QID) | ORAL | Status: DC | PRN

## 2014-06-24 NOTE — Progress Notes (Signed)
Pre visit review using our clinic review tool, if applicable. No additional management support is needed unless otherwise documented below in the visit note. 

## 2014-06-24 NOTE — Progress Notes (Signed)
   Subjective:    Patient ID: Maria Schneider, female    DOB: 07-23-1945, 69 y.o.   MRN: [id number]  HPI Here to ask advice about right hip pain. This started about a month ago and she describes sharp pains in the anterior right hip. She saw Gerrit Halls PA at [place] revealed early osteoarthritis. He told her to take Aleve and set her up for a steroid injection in the hip per Dr. Samella Parr on 08-06-14. This is not helping her pain however.    Review of Systems  Constitutional: Negative.   Musculoskeletal: Positive for arthralgias and gait problem. Negative for back pain.       Objective:   Physical Exam  Constitutional: She appears well-developed and well-nourished.  Musculoskeletal:  Decreased ROM of the right hip due to pain          Assessment & Plan:  Use Tramadol 50 mg prn

## 2014-08-05 ENCOUNTER — Encounter: Payer: Self-pay | Admitting: Family Medicine

## 2014-08-09 NOTE — Telephone Encounter (Signed)
Just stop the Cephalexin and we will see her on Monday

## 2014-08-10 ENCOUNTER — Encounter: Payer: Self-pay | Admitting: Family Medicine

## 2014-08-10 ENCOUNTER — Ambulatory Visit (INDEPENDENT_AMBULATORY_CARE_PROVIDER_SITE_OTHER): Payer: Medicare Other | Admitting: Family Medicine

## 2014-08-10 VITALS — BP 127/79 | HR 59 | Temp 98.1°F | Ht 65.0 in | Wt 165.0 lb

## 2014-08-10 DIAGNOSIS — R319 Hematuria, unspecified: Secondary | ICD-10-CM

## 2014-08-10 LAB — POCT URINALYSIS DIPSTICK
BILIRUBIN UA: NEGATIVE
Blood, UA: NEGATIVE
GLUCOSE UA: NEGATIVE
KETONES UA: NEGATIVE
NITRITE UA: NEGATIVE
SPEC GRAV UA: 1.02
Urobilinogen, UA: 1
pH, UA: 7

## 2014-08-10 NOTE — Progress Notes (Signed)
   Subjective:    Patient ID: Maria Schneider, female    DOB: 1945/03/29, 69 y.o.   MRN: [id number]  HPI Here to recheck a possible UTI. On 08-01-14 while visiting in [place] she developed blood in the urine. She had no symptoms at that time, no pain or urgency. She went to the ER and had a UA done which showed blood but no evidence of bacteria. She was treated anyway with Keflex 500 mg QID. The day after that visit she did have some mild lower pelvic pain but this stopped. She has felt fine ever since. Drinking plenty of water.    Review of Systems  Constitutional: Negative.   Genitourinary: Positive for hematuria. Negative for dysuria, urgency, frequency and flank pain.       Objective:   Physical Exam  Constitutional: She appears well-developed and well-nourished.  Abdominal: Soft. Bowel sounds are normal. She exhibits no distension and no mass. There is no tenderness. There is no rebound and no guarding.          Assessment & Plan:  It is not clear whether this was truly a UTI or not. It is possible she passed a small kidney stone. We will culture the sample today. She will stop the antibiotics and she will return prn.

## 2014-08-10 NOTE — Progress Notes (Signed)
Pre visit review using our clinic review tool, if applicable. No additional management support is needed unless otherwise documented below in the visit note. 

## 2014-08-13 ENCOUNTER — Encounter: Payer: Self-pay | Admitting: Family Medicine

## 2014-08-13 LAB — URINE CULTURE: Colony Count: 40000

## 2014-08-14 NOTE — Telephone Encounter (Signed)
There is no need to take any more antibiotics at this time

## 2014-08-16 ENCOUNTER — Emergency Department (HOSPITAL_COMMUNITY)
Admission: EM | Admit: 2014-08-16 | Discharge: 2014-08-16 | Disposition: A | Discharge status: Home / Self Care | Payer: Medicare Other | Attending: Emergency Medicine | Admitting: Emergency Medicine

## 2014-08-16 ENCOUNTER — Encounter (HOSPITAL_COMMUNITY): Payer: Self-pay | Admitting: *Deleted

## 2014-08-16 ENCOUNTER — Emergency Department (HOSPITAL_COMMUNITY): Payer: Medicare Other

## 2014-08-16 DIAGNOSIS — Z8719 Personal history of other diseases of the digestive system: Secondary | ICD-10-CM | POA: Diagnosis not present

## 2014-08-16 DIAGNOSIS — Z87891 Personal history of nicotine dependence: Secondary | ICD-10-CM | POA: Insufficient documentation

## 2014-08-16 DIAGNOSIS — N23 Unspecified renal colic: Secondary | ICD-10-CM | POA: Insufficient documentation

## 2014-08-16 DIAGNOSIS — Z87440 Personal history of urinary (tract) infections: Secondary | ICD-10-CM | POA: Diagnosis not present

## 2014-08-16 DIAGNOSIS — R1011 Right upper quadrant pain: Secondary | ICD-10-CM | POA: Diagnosis present

## 2014-08-16 DIAGNOSIS — M8580 Other specified disorders of bone density and structure, unspecified site: Secondary | ICD-10-CM | POA: Insufficient documentation

## 2014-08-16 DIAGNOSIS — I10 Essential (primary) hypertension: Secondary | ICD-10-CM | POA: Diagnosis not present

## 2014-08-16 DIAGNOSIS — J45909 Unspecified asthma, uncomplicated: Secondary | ICD-10-CM | POA: Insufficient documentation

## 2014-08-16 DIAGNOSIS — Z79899 Other long term (current) drug therapy: Secondary | ICD-10-CM | POA: Insufficient documentation

## 2014-08-16 DIAGNOSIS — F329 Major depressive disorder, single episode, unspecified: Secondary | ICD-10-CM | POA: Diagnosis not present

## 2014-08-16 DIAGNOSIS — E559 Vitamin D deficiency, unspecified: Secondary | ICD-10-CM | POA: Insufficient documentation

## 2014-08-16 DIAGNOSIS — Z86018 Personal history of other benign neoplasm: Secondary | ICD-10-CM | POA: Diagnosis not present

## 2014-08-16 DIAGNOSIS — E785 Hyperlipidemia, unspecified: Secondary | ICD-10-CM | POA: Insufficient documentation

## 2014-08-16 DIAGNOSIS — R109 Unspecified abdominal pain: Secondary | ICD-10-CM

## 2014-08-16 LAB — I-STAT CHEM 8, ED
BUN: 21 mg/dL — AB (ref 6–20)
CALCIUM ION: 1.21 mmol/L (ref 1.13–1.30)
CHLORIDE: 104 mmol/L (ref 101–111)
CREATININE: 1.3 mg/dL — AB (ref 0.44–1.00)
Glucose, Bld: 104 mg/dL — ABNORMAL HIGH (ref 65–99)
HEMATOCRIT: 38 % (ref 36.0–46.0)
HEMOGLOBIN: 12.9 g/dL (ref 12.0–15.0)
Potassium: 3.9 mmol/L (ref 3.5–5.1)
Sodium: 140 mmol/L (ref 135–145)
TCO2: 22 mmol/L (ref 0–100)

## 2014-08-16 LAB — CBC WITH DIFFERENTIAL/PLATELET
BASOS ABS: 0 10*3/uL (ref 0.0–0.1)
Basophils Relative: 0 % (ref 0–1)
EOS ABS: 0.1 10*3/uL (ref 0.0–0.7)
EOS PCT: 2 % (ref 0–5)
HCT: 38.6 % (ref 36.0–46.0)
HEMOGLOBIN: 12.4 g/dL (ref 12.0–15.0)
LYMPHS PCT: 21 % (ref 12–46)
Lymphs Abs: 1.3 10*3/uL (ref 0.7–4.0)
MCH: 28 pg (ref 26.0–34.0)
MCHC: 32.1 g/dL (ref 30.0–36.0)
MCV: 87.1 fL (ref 78.0–100.0)
MONO ABS: 0.6 10*3/uL (ref 0.1–1.0)
MONOS PCT: 10 % (ref 3–12)
Neutro Abs: 4.3 10*3/uL (ref 1.7–7.7)
Neutrophils Relative %: 67 % (ref 43–77)
PLATELETS: 207 10*3/uL (ref 150–400)
RBC: 4.43 MIL/uL (ref 3.87–5.11)
RDW: 12.8 % (ref 11.5–15.5)
WBC: 6.4 10*3/uL (ref 4.0–10.5)

## 2014-08-16 LAB — URINALYSIS, ROUTINE W REFLEX MICROSCOPIC
Glucose, UA: NEGATIVE mg/dL
KETONES UR: NEGATIVE mg/dL
Nitrite: NEGATIVE
PROTEIN: NEGATIVE mg/dL
Specific Gravity, Urine: 1.023 (ref 1.005–1.030)
UROBILINOGEN UA: 1 mg/dL (ref 0.0–1.0)
pH: 5 (ref 5.0–8.0)

## 2014-08-16 LAB — LIPASE, BLOOD: Lipase: 32 U/L (ref 22–51)

## 2014-08-16 LAB — URINE MICROSCOPIC-ADD ON

## 2014-08-16 MED ORDER — FENTANYL CITRATE (PF) 100 MCG/2ML IJ SOLN
100.0000 ug | Freq: Once | INTRAMUSCULAR | Status: AC
Start: 2014-08-16 — End: 2014-08-16
  Administered 2014-08-16: 100 ug via INTRAVENOUS
  Filled 2014-08-16: qty 2

## 2014-08-16 MED ORDER — DIPHENHYDRAMINE HCL 50 MG/ML IJ SOLN
12.5000 mg | Freq: Once | INTRAMUSCULAR | Status: AC
  Administered 2014-08-16: 12.5 mg via INTRAVENOUS
  Filled 2014-08-16: qty 1

## 2014-08-16 MED ORDER — OXYCODONE-ACETAMINOPHEN 5-325 MG PO TABS
1.0000 | ORAL_TABLET | Freq: Four times a day (QID) | ORAL | Status: DC | PRN
Start: 2014-08-16 — End: 2015-01-15

## 2014-08-16 MED ORDER — TAMSULOSIN HCL 0.4 MG PO CAPS: ORAL_CAPSULE | ORAL | Status: DC

## 2014-08-16 MED ORDER — ONDANSETRON 4 MG PO TBDP: 4.0000 mg | ORAL_TABLET | Freq: Three times a day (TID) | ORAL | Status: DC | PRN

## 2014-08-16 MED ORDER — FENTANYL CITRATE (PF) 100 MCG/2ML IJ SOLN
100.0000 ug | Freq: Once | INTRAMUSCULAR | Status: AC
  Administered 2014-08-16: 100 ug via INTRAVENOUS
  Filled 2014-08-16: qty 2

## 2014-08-16 MED ORDER — ONDANSETRON HCL 4 MG/2ML IJ SOLN
4.0000 mg | Freq: Once | INTRAMUSCULAR | Status: AC
  Administered 2014-08-16: 4 mg via INTRAVENOUS
  Filled 2014-08-16: qty 2

## 2014-08-16 MED ORDER — TAMSULOSIN HCL 0.4 MG PO CAPS
0.4000 mg | ORAL_CAPSULE | Freq: Once | ORAL | Status: AC
  Administered 2014-08-16: 0.4 mg via ORAL
  Filled 2014-08-16: qty 1

## 2014-08-16 NOTE — ED Provider Notes (Signed)
CSN: [id number]     Arrival date & time 08/16/14  0205 History   First MD Initiated Contact with Patient 08/16/14 0359     Chief Complaint  Patient presents with  . Abdominal Pain     (Consider location/radiation/quality/duration/timing/severity/associated sxs/prior Treatment) HPI  This is a 69 year old female who had some hematuria about a week ago and was seen in a hospital in [place] and treated for urinary tract infection. The hematuria resolved. 3 evenings ago after dinner she developed severe right upper quadrant pain associated with nausea and vomiting. This too resolved. She has had some mild residual pain in her right flank. This morning she developed pain in her right flank radiating to her right lower quadrant. She rates this is a 7 out of 10. She has had some intermittent nausea but no vomiting. She has not seen any blood in her urine today. The pain is somewhat worse with palpation or movement. She is not aware of having a fever.  She is status post cholecystectomy.  Past Medical History  Diagnosis Date  . Hyperlipidemia   . Hypertension   . Vitamin D deficiency   . Vitamin B12 deficiency   . Depression   . Allergy   . Asthma   . Urinary incontinence   . GERD (gastroesophageal reflux disease)   . Leiomyoma   . Detrusor instability   . Osteopenia   . Fibroid    Past Surgical History  Procedure Laterality Date  . Breast biopsy  1972    benign  . Colonoscopy  06-24-12    per Dr. Earlean Shawl, benign polyps, repeat in 5 yrs    . Tubal ligation    . Tonsillectomy    . Abdominal hysterectomy  1994    TAH/ BLADDER SUSPENSION  . Cholecystectomy N/A 07/12/2013    Procedure: LAPAROSCOPIC CHOLECYSTECTOMY WITH INTRAOPERATIVE CHOLANGIOGRAM;  Surgeon: Edward Jolly, MD;  Location: [facility];  Service: General;  Laterality: N/A;  . Ercp N/A 07/14/2013    Procedure: ENDOSCOPIC RETROGRADE CHOLANGIOPANCREATOGRAPHY (ERCP);  Surgeon: Gatha Mayer, MD;  Location: [facility];   Service: Endoscopy;  Laterality: N/A;   Family History  Problem Relation Age of Onset  . Hypertension Mother   . Osteoporosis Mother   . Heart disease Father   . Osteoporosis Sister   . Breast cancer Paternal Aunt     Age 73's  . Diabetes Paternal Uncle    History  Substance Use Topics  . Smoking status: Former Smoker -- 42 years    Types: Cigarettes    Quit date: 07/09/2008  . Smokeless tobacco: Never Used     Comment: quit 6 months ago  . Alcohol Use: 1.2 oz/week    0 Standard drinks or equivalent, 2 Glasses of wine per week     Comment: occ   OB History    Gravida Para Term Preterm AB TAB SAB Ectopic Multiple Living   2 1 1  1  1   1      Review of Systems  All other systems reviewed and are negative.   Allergies  Ace inhibitors; Codeine; and Statins  Home Medications   Prior to Admission medications   Medication Sig Start Date End Date Taking? Authorizing Provider  fenofibrate 160 MG tablet TAKE 1 TABLET EVERY DAY 08/19/13  Yes Laurey Morale, MD  buPROPion (WELLBUTRIN XL) 300 MG 24 hr tablet Take 300 mg by mouth daily.      Historical Provider, MD  cholecalciferol (VITAMIN D) 1000 UNITS  tablet Take 3,000 Units by mouth daily.     Historical Provider, MD  cyclobenzaprine (FLEXERIL) 10 MG tablet Take 1 tablet (10 mg total) by mouth 3 (three) times daily as needed for muscle spasms. 12/09/12   Laurey Morale, MD  diclofenac (VOLTAREN) 75 MG EC tablet TAKE 1 TABLET (75 MG TOTAL) BY MOUTH 2 (TWO) TIMES DAILY AS NEEDED (PAIN ). Patient not taking: Reported on 08/10/2014 10/14/12   Laurey Morale, MD  ezetimibe (ZETIA) 10 MG tablet TAKE 1 TABLET BY MOUTH EVERY DAY 08/19/13   Laurey Morale, MD  metoprolol succinate (TOPROL-XL) 25 MG 24 hr tablet Take 1 tablet (25 mg total) by mouth daily. 01/07/14   Laurey Morale, MD  naproxen (NAPROSYN) 250 MG tablet Take 500 mg by mouth 2 (two) times daily as needed (pain).    Historical Provider, MD  sertraline (ZOLOFT) 100 MG tablet Take 200 mg  by mouth daily.     Historical Provider, MD  traMADol (ULTRAM) 50 MG tablet Take 1 tablet (50 mg total) by mouth every 6 (six) hours as needed for moderate pain. 06/24/14   Laurey Morale, MD   BP 185/75 mmHg  Pulse 68  Temp(Src) 97.6 F (36.4 C) (Oral)  Resp 19  Ht 5\' 5"  (1.651 m)  Wt 165 lb (74.844 kg)  BMI 27.46 kg/m2  SpO2 98%   Physical Exam  General: Well-developed, well-nourished female in no acute distress; appearance consistent with age of record HENT: normocephalic; atraumatic Eyes: pupils equal, round and reactive to light; extraocular muscles intact Neck: supple Heart: regular rate and rhythm Lungs: clear to auscultation bilaterally Abdomen: soft; nondistended; right lower quadrant tenderness; no masses or hepatosplenomegaly; bowel sounds present GU: Right CVA tenderness Extremities: No deformity; full range of motion; pulses normal Neurologic: Awake, alert and oriented; motor function intact in all extremities and symmetric; no facial droop Skin: Warm and dry Psychiatric: Normal mood and affect    ED Course  Procedures (including critical care time)   MDM   Nursing notes and vitals signs, including pulse oximetry, reviewed.  Summary of this visit's results, reviewed by myself:  Labs:  Results for orders placed or performed during the hospital encounter of 08/16/14 (from the past 24 hour(s))  Urinalysis with microscopic     Status: Abnormal   Collection Time: 08/16/14  3:18 AM  Result Value Ref Range   Color, Urine YELLOW YELLOW   APPearance CLOUDY (A) CLEAR   Specific Gravity, Urine 1.023 1.005 - 1.030   pH 5.0 5.0 - 8.0   Glucose, UA NEGATIVE NEGATIVE mg/dL   Hgb urine dipstick SMALL (A) NEGATIVE   Bilirubin Urine SMALL (A) NEGATIVE   Ketones, ur NEGATIVE NEGATIVE mg/dL   Protein, ur NEGATIVE NEGATIVE mg/dL   Urobilinogen, UA 1.0 0.0 - 1.0 mg/dL   Nitrite NEGATIVE NEGATIVE   Leukocytes, UA TRACE (A) NEGATIVE  Urine microscopic-add on     Status:  Abnormal   Collection Time: 08/16/14  3:18 AM  Result Value Ref Range   Squamous Epithelial / LPF RARE RARE   WBC, UA 0-2 <3 WBC/hpf   Crystals CA OXALATE CRYSTALS (A) NEGATIVE   Urine-Other MUCOUS PRESENT   CBC WITH DIFFERENTIAL     Status: None   Collection Time: 08/16/14  3:25 AM  Result Value Ref Range   WBC 6.4 4.0 - 10.5 K/uL   RBC 4.43 3.87 - 5.11 MIL/uL   Hemoglobin 12.4 12.0 - 15.0 g/dL   HCT 38.6 36.0 -  46.0 %   MCV 87.1 78.0 - 100.0 fL   MCH 28.0 26.0 - 34.0 pg   MCHC 32.1 30.0 - 36.0 g/dL   RDW 12.8 11.5 - 15.5 %   Platelets 207 150 - 400 K/uL   Neutrophils Relative % 67 43 - 77 %   Neutro Abs 4.3 1.7 - 7.7 K/uL   Lymphocytes Relative 21 12 - 46 %   Lymphs Abs 1.3 0.7 - 4.0 K/uL   Monocytes Relative 10 3 - 12 %   Monocytes Absolute 0.6 0.1 - 1.0 K/uL   Eosinophils Relative 2 0 - 5 %   Eosinophils Absolute 0.1 0.0 - 0.7 K/uL   Basophils Relative 0 0 - 1 %   Basophils Absolute 0.0 0.0 - 0.1 K/uL  Lipase, blood     Status: None   Collection Time: 08/16/14  3:25 AM  Result Value Ref Range   Lipase 32 22 - 51 U/L  I-Stat Chem 8 ED  (not at [facility], [facility])     Status: Abnormal   Collection Time: 08/16/14  3:27 AM  Result Value Ref Range   Sodium 140 135 - 145 mmol/L   Potassium 3.9 3.5 - 5.1 mmol/L   Chloride 104 101 - 111 mmol/L   BUN 21 (H) 6 - 20 mg/dL   Creatinine, Ser 1.30 (H) 0.44 - 1.00 mg/dL   Glucose, Bld 104 (H) 65 - 99 mg/dL   Calcium, Ion 1.21 1.13 - 1.30 mmol/L   TCO2 22 0 - 100 mmol/L   Hemoglobin 12.9 12.0 - 15.0 g/dL   HCT 38.0 36.0 - 46.0 %    Imaging Studies: Ct Renal Stone Study  08/16/2014   CLINICAL DATA:  Flank pain, right greater than left that was sudden onset.  EXAM: CT ABDOMEN AND PELVIS WITHOUT CONTRAST  TECHNIQUE: Multidetector CT imaging of the abdomen and pelvis was performed following the standard protocol without IV contrast.  COMPARISON:  04/13/2010  FINDINGS: BODY WALL: No contributory findings.  LOWER CHEST: No contributory  findings.  ABDOMEN/PELVIS:  Liver: No focal abnormality.  Biliary: Cholecystectomy.  No bile duct enlargement.  Pancreas: Unremarkable.  Spleen: Unremarkable.  Adrenals: Unremarkable.  Kidneys and ureters: 3 mm stone at the right ureteral vesicular junction with moderate hydroureteronephrosis and asymmetric right perinephric edema. No other measurable urolithiasis. There is a 4 mm fatty focus in the lower pole left kidney compatible with angiomyolipoma.  Bladder: Unremarkable.  Reproductive: Hysterectomy  Bowel: No obstruction. Extensive distal colonic diverticulosis. Normal appendix.  Retroperitoneum: No mass or adenopathy.  Peritoneum: No ascites or pneumoperitoneum.  Vascular: No acute abnormality.  OSSEOUS: Fatty filum terminale, incidental based on thin diameter  IMPRESSION: Obstructing 4 mm stone at the right ureteral vesicular junction.   Electronically Signed   By: Monte Fantasia M.D.   On: 08/16/2014 04:48   5:15 AM Patient advised of CT and lab findings. She has a urologist, Dr. Gaynelle Arabian, with whom she will follow-up.    Shanon Rosser, MD 08/16/14 [phone]

## 2014-08-16 NOTE — ED Notes (Signed)
Abd pain that came on suddenly this AM, within the hour.  Denies any painful. Nausea, and vomiting now in triage.  She reports past hx of kidney stone.  Denies blood in urine today.

## 2014-08-16 NOTE — ED Notes (Signed)
Patient transported to CT 

## 2014-08-27 ENCOUNTER — Other Ambulatory Visit: Payer: Self-pay | Admitting: Family Medicine

## 2014-09-14 ENCOUNTER — Other Ambulatory Visit: Payer: Self-pay

## 2014-09-22 ENCOUNTER — Ambulatory Visit (INDEPENDENT_AMBULATORY_CARE_PROVIDER_SITE_OTHER): Payer: Medicare Other | Admitting: Family Medicine

## 2014-09-22 ENCOUNTER — Encounter: Payer: Self-pay | Admitting: Family Medicine

## 2014-09-22 VITALS — BP 122/77 | HR 84 | Temp 98.7°F | Ht 65.0 in | Wt 167.0 lb

## 2014-09-22 DIAGNOSIS — K5900 Constipation, unspecified: Secondary | ICD-10-CM | POA: Diagnosis not present

## 2014-09-22 NOTE — Progress Notes (Signed)
Pre visit review using our clinic review tool, if applicable. No additional management support is needed unless otherwise documented below in the visit note. 

## 2014-09-22 NOTE — Progress Notes (Signed)
   Subjective:    Patient ID: Maria Schneider, female    DOB: 12-25-1945, 69 y.o.   MRN: [id number]  HPI Here for one week of mild abdominal bloating and constipation. She is passing a small stool about every 3 days. No nausea or fever.    Review of Systems  Constitutional: Negative.   Respiratory: Negative.   Cardiovascular: Negative.   Gastrointestinal: Positive for abdominal pain, constipation and abdominal distention. Negative for vomiting, diarrhea, blood in stool, anal bleeding and rectal pain.  Genitourinary: Negative.        Objective:   Physical Exam  Constitutional: She appears well-developed and well-nourished.  Neck: No thyromegaly present.  Cardiovascular: Normal rate, regular rhythm, normal heart sounds and intact distal pulses.   Pulmonary/Chest: Effort normal and breath sounds normal.  Abdominal: Soft. Bowel sounds are normal. She exhibits no distension and no mass. There is no tenderness. There is no rebound and no guarding.  Lymphadenopathy:    She has no cervical adenopathy.          Assessment & Plan:  She has some constipation. Suggested she drink plenty of water and to use Miralax in water BID unitl her bowels move. Then she can reduces this to once a day for maintenance. Also try MOM one tbsp TID until her bowels move.

## 2014-10-19 ENCOUNTER — Ambulatory Visit (INDEPENDENT_AMBULATORY_CARE_PROVIDER_SITE_OTHER): Payer: Medicare Other | Admitting: Family Medicine

## 2014-10-19 ENCOUNTER — Encounter: Payer: Self-pay | Admitting: Family Medicine

## 2014-10-19 VITALS — BP 138/88 | HR 62 | Temp 98.2°F | Ht 65.0 in | Wt 168.0 lb

## 2014-10-19 DIAGNOSIS — M25561 Pain in right knee: Secondary | ICD-10-CM

## 2014-10-19 NOTE — Progress Notes (Signed)
   Subjective:    Patient ID: Maria Schneider, female    DOB: 09/15/1945, 69 y.o.   MRN: [id number]  HPI Here for advice about her right knee pain. She is taking Naproxen and she takes Tramadol at night. She cannot take Tramadol in the daytime because it makes her sleepy. She has an appt to see Dr. Wynelle Link on 10-27-14.    Review of Systems  Constitutional: Negative.   Musculoskeletal: Positive for arthralgias and gait problem.       Objective:   Physical Exam  Constitutional: She appears well-developed and well-nourished. No distress.  Musculoskeletal:  The right knee has tenderness in both joint spaces. Some crepitus. ROM is full, no swelling.           Assessment & Plan:  I suggested she try heat, Icy Hot, and wearing an elastic support brace.

## 2014-10-19 NOTE — Progress Notes (Signed)
Pre visit review using our clinic review tool, if applicable. No additional management support is needed unless otherwise documented below in the visit note. 

## 2014-12-01 NOTE — Progress Notes (Signed)
Please put orders in Epic surgery 01-13-15 pre op 01-06-15 Thanks

## 2014-12-04 ENCOUNTER — Other Ambulatory Visit: Payer: Self-pay | Admitting: Family Medicine

## 2014-12-04 ENCOUNTER — Encounter: Payer: Self-pay | Admitting: Family Medicine

## 2014-12-04 MED ORDER — EZETIMIBE 10 MG PO TABS: 10.0000 mg | ORAL_TABLET | Freq: Every day | ORAL | Status: DC

## 2014-12-04 MED ORDER — FENOFIBRATE 160 MG PO TABS: 160.0000 mg | ORAL_TABLET | Freq: Every day | ORAL | Status: DC

## 2014-12-04 NOTE — Telephone Encounter (Signed)
Refill both for one year. 

## 2014-12-17 ENCOUNTER — Telehealth: Payer: Self-pay | Admitting: Family Medicine

## 2014-12-17 NOTE — Telephone Encounter (Signed)
Pt call to ask if she need to have labs drawn before she see you for surgical clearance.

## 2014-12-18 NOTE — Telephone Encounter (Signed)
Per Dr. Sarajane Jews, no need to do any labs first. If pt needs labs, we can order during her office visit. I called and left a voice message for pt with this information.

## 2014-12-22 ENCOUNTER — Encounter: Payer: Self-pay | Admitting: Family Medicine

## 2014-12-22 ENCOUNTER — Ambulatory Visit (INDEPENDENT_AMBULATORY_CARE_PROVIDER_SITE_OTHER): Payer: Medicare Other | Admitting: Family Medicine

## 2014-12-22 VITALS — BP 141/83 | HR 66 | Temp 98.2°F | Ht 65.0 in | Wt 170.0 lb

## 2014-12-22 DIAGNOSIS — Z01818 Encounter for other preprocedural examination: Secondary | ICD-10-CM

## 2014-12-22 DIAGNOSIS — Z23 Encounter for immunization: Secondary | ICD-10-CM

## 2014-12-22 LAB — POCT URINALYSIS DIPSTICK
Bilirubin, UA: NEGATIVE
Blood, UA: NEGATIVE
Glucose, UA: NEGATIVE
Ketones, UA: NEGATIVE
LEUKOCYTES UA: NEGATIVE
NITRITE UA: NEGATIVE
PH UA: 6.5
PROTEIN UA: NEGATIVE
Spec Grav, UA: 1.025
UROBILINOGEN UA: 2

## 2014-12-22 NOTE — Addendum Note (Signed)
Addended by: [name] A on: 12/22/2014 04:20 PM   Modules accepted: Orders

## 2014-12-22 NOTE — Progress Notes (Signed)
Pre visit review using our clinic review tool, if applicable. No additional management support is needed unless otherwise documented below in the visit note. 

## 2014-12-22 NOTE — Addendum Note (Signed)
Addended by: Aggie Hacker A on: 12/22/2014 04:28 PM   Modules accepted: Orders

## 2014-12-22 NOTE — Progress Notes (Signed)
   Subjective:    Patient ID: Maria Schneider, female    DOB: 01/23/1946, 69 y.o.   MRN: [id number]  HPI Here for a preoperative evaluation prior to a planned right total hip replacement on 01-13-15 per Dr. Wynelle Link. Other than the hip pain, she feels fine.    Review of Systems  Constitutional: Negative.   Respiratory: Negative.   Cardiovascular: Negative.   Gastrointestinal: Negative.   Endocrine: Negative.   Genitourinary: Negative.   Neurological: Negative.   Hematological: Negative.        Objective:   Physical Exam  Constitutional: She is oriented to person, place, and time. She appears well-developed and well-nourished.  Neck: No thyromegaly present.  Cardiovascular: Normal rate, regular rhythm, normal heart sounds and intact distal pulses.   EKG normal   Pulmonary/Chest: Effort normal and breath sounds normal. No respiratory distress. She has no wheezes. She has no rales.  Abdominal: Soft. Bowel sounds are normal. She exhibits no distension and no mass. There is no tenderness. There is no rebound and no guarding.  Lymphadenopathy:    She has no cervical adenopathy.  Neurological: She is alert and oriented to person, place, and time. She has normal reflexes. No cranial nerve deficit. She exhibits normal muscle tone. Coordination normal.          Assessment & Plan:  Preoperative clearance. She will have labs drawn now.

## 2014-12-23 LAB — CBC WITH DIFFERENTIAL/PLATELET
Basophils Absolute: 0 10*3/uL (ref 0.0–0.1)
Basophils Relative: 0.4 % (ref 0.0–3.0)
Eosinophils Absolute: 0.1 10*3/uL (ref 0.0–0.7)
Eosinophils Relative: 2.3 % (ref 0.0–5.0)
HCT: 34.8 % — ABNORMAL LOW (ref 36.0–46.0)
Hemoglobin: 11.6 g/dL — ABNORMAL LOW (ref 12.0–15.0)
Lymphocytes Relative: 20.4 % (ref 12.0–46.0)
Lymphs Abs: 0.9 10*3/uL (ref 0.7–4.0)
MCHC: 33.3 g/dL (ref 30.0–36.0)
MCV: 84.8 fl (ref 78.0–100.0)
Monocytes Absolute: 0.4 10*3/uL (ref 0.1–1.0)
Monocytes Relative: 9.5 % (ref 3.0–12.0)
Neutro Abs: 3 10*3/uL (ref 1.4–7.7)
Neutrophils Relative %: 67.4 % (ref 43.0–77.0)
Platelets: 184 10*3/uL (ref 150.0–400.0)
RBC: 4.1 Mil/uL (ref 3.87–5.11)
RDW: 13.3 % (ref 11.5–15.5)
WBC: 4.4 10*3/uL (ref 4.0–10.5)

## 2014-12-23 LAB — BASIC METABOLIC PANEL WITH GFR
BUN: 23 mg/dL (ref 6–23)
CO2: 31 meq/L (ref 19–32)
Calcium: 9.5 mg/dL (ref 8.4–10.5)
Chloride: 104 meq/L (ref 96–112)
Creatinine, Ser: 1.18 mg/dL (ref 0.40–1.20)
GFR: 48.54 mL/min — ABNORMAL LOW
Glucose, Bld: 69 mg/dL — ABNORMAL LOW (ref 70–99)
Potassium: 4.4 meq/L (ref 3.5–5.1)
Sodium: 143 meq/L (ref 135–145)

## 2014-12-23 LAB — HEPATIC FUNCTION PANEL
ALT: 13 U/L (ref 0–35)
AST: 17 U/L (ref 0–37)
Albumin: 4.3 g/dL (ref 3.5–5.2)
Alkaline Phosphatase: 54 U/L (ref 39–117)
Bilirubin, Direct: 0.1 mg/dL (ref 0.0–0.3)
Total Bilirubin: 0.3 mg/dL (ref 0.2–1.2)
Total Protein: 6.4 g/dL (ref 6.0–8.3)

## 2014-12-23 LAB — TSH: TSH: 1.88 u[IU]/mL (ref 0.35–4.50)

## 2014-12-29 ENCOUNTER — Ambulatory Visit: Payer: Self-pay | Admitting: Orthopedic Surgery

## 2014-12-29 NOTE — Progress Notes (Signed)
Preoperative surgical orders have been place into the Epic hospital system for Maria Schneider on 12/29/2014, 12:32 PM  by Mickel Crow for surgery on 01-13-2015.  Preop Total Hip - Anterior Approach orders including IV Tylenol, and IV Decadron as long as there are no contraindications to the above medications. [name], PA-C

## 2015-01-05 ENCOUNTER — Ambulatory Visit: Payer: Self-pay | Admitting: Orthopedic Surgery

## 2015-01-05 NOTE — Patient Instructions (Addendum)
YOUR PROCEDURE IS SCHEDULED ON :  01/13/15  REPORT TO [place] MAIN ENTRANCE FOLLOW SIGNS TO EAST ELEVATOR - GO TO 3rd FLOOR CHECK IN AT 3 EAST NURSES STATION (SHORT STAY) AT:  12:00 PM  CALL THIS NUMBER IF YOU HAVE PROBLEMS THE MORNING OF SURGERY [phone]  REMEMBER:ONLY 1 PER PERSON MAY GO TO SHORT STAY WITH YOU TO GET READY THE MORNING OF YOUR SURGERY  DO NOT EAT FOOD  AFTER MIDNIGHT  MAY HAVE CLEAR LIQUIDS UNTIL 8:30 AM  TAKE THESE MEDICINES THE MORNING OF SURGERY: WELLBUTRIN / NEXIUM / METOPROLOL / [place] Allowed                                                                     Foods Excluded  Coffee and tea, regular and decaf                             liquids that you cannot  Plain Jell-O in any flavor                                             see through such as: Fruit ices (not with fruit pulp)                                     milk, soups, orange juice  Iced Popsicles                                                  All solid food Carbonated beverages, regular and diet                                    Cranberry, grape and apple juices Sports drinks like Gatorade Lightly seasoned clear broth or consume(fat free) Sugar, honey syrup    _____________________________________________________________________    YOU MAY NOT HAVE ANY METAL ON YOUR BODY INCLUDING HAIR PINS AND PIERCING'S. DO NOT WEAR JEWELRY, MAKEUP, LOTIONS, POWDERS OR PERFUMES. DO NOT WEAR NAIL POLISH. DO NOT SHAVE 48 HRS PRIOR TO SURGERY. MEN MAY SHAVE FACE AND NECK.  DO NOT [place]. [facility] IS NOT RESPONSIBLE FOR VALUABLES.  CONTACTS, DENTURES OR PARTIALS MAY NOT BE WORN TO SURGERY. LEAVE SUITCASE IN CAR. CAN BE BROUGHT TO ROOM AFTER SURGERY.  PATIENTS DISCHARGED THE DAY OF SURGERY WILL NOT BE ALLOWED TO DRIVE HOME.  PLEASE READ OVER THE FOLLOWING INSTRUCTION  SHEETS _________________________________________________________________________________                                          [facility] - PREPARING FOR SURGERY  Before surgery, you can play an important  role.  Because skin is not sterile, your skin needs to be as free of germs as possible.  You can reduce the number of germs on your skin by washing with CHG (chlorahexidine gluconate) soap before surgery.  CHG is an antiseptic cleaner which kills germs and bonds with the skin to continue killing germs even after washing. Please DO NOT use if you have an allergy to CHG or antibacterial soaps.  If your skin becomes reddened/irritated stop using the CHG and inform your nurse when you arrive at Short Stay. Do not shave (including legs and underarms) for at least 48 hours prior to the first CHG shower.  You may shave your face. Please follow these instructions carefully:   1.  Shower with CHG Soap the night before surgery and the  morning of Surgery.   2.  If you choose to wash your hair, wash your hair first as usual with your  normal  Shampoo.   3.  After you shampoo, rinse your hair and body thoroughly to remove the  shampoo.                                         4.  Use CHG as you would any other liquid soap.  You can apply chg directly  to the skin and wash . Gently wash with scrungie or clean wascloth    5.  Apply the CHG Soap to your body ONLY FROM THE NECK DOWN.   Do not use on open                           Wound or open sores. Avoid contact with eyes, ears mouth and genitals (private parts).                        Genitals (private parts) with your normal soap.              6.  Wash thoroughly, paying special attention to the area where your surgery  will be performed.   7.  Thoroughly rinse your body with warm water from the neck down.   8.  DO NOT shower/wash with your normal soap after using and rinsing off  the CHG Soap .                9.  Pat yourself dry with a clean  towel.             10.  Wear clean night clothes to bed after shower             11.  Place clean sheets on your bed the night of your first shower and do not  sleep with pets.  Day of Surgery : Do not apply any lotions/deodorants the morning of surgery.  Please wear clean clothes to the hospital/surgery center.  FAILURE TO FOLLOW THESE INSTRUCTIONS MAY RESULT IN THE CANCELLATION OF YOUR SURGERY    PATIENT SIGNATURE_________________________________  ______________________________________________________________________     Maria Schneider  An incentive spirometer is a tool that can help keep your lungs clear and active. This tool measures how well you are filling your lungs with each breath. Taking long deep breaths may help reverse or decrease the chance of developing breathing (pulmonary) problems (especially infection) following:  A long period of time when you are unable  to move or be active. BEFORE THE PROCEDURE   If the spirometer includes an indicator to show your best effort, your nurse or respiratory therapist will set it to a desired goal.  If possible, sit up straight or lean slightly forward. Try not to slouch.  Hold the incentive spirometer in an upright position. INSTRUCTIONS FOR USE   Sit on the edge of your bed if possible, or sit up as far as you can in bed or on a chair.  Hold the incentive spirometer in an upright position.  Breathe out normally.  Place the mouthpiece in your mouth and seal your lips tightly around it.  Breathe in slowly and as deeply as possible, raising the piston or the ball toward the top of the column.  Hold your breath for 3-5 seconds or for as long as possible. Allow the piston or ball to fall to the bottom of the column.  Remove the mouthpiece from your mouth and breathe out normally.  Rest for a few seconds and repeat Steps 1 through 7 at least 10 times every 1-2 hours when you are awake. Take your time and take a few  normal breaths between deep breaths.  The spirometer may include an indicator to show your best effort. Use the indicator as a goal to work toward during each repetition.  After each set of 10 deep breaths, practice coughing to be sure your lungs are clear. If you have an incision (the cut made at the time of surgery), support your incision when coughing by placing a pillow or rolled up towels firmly against it. Once you are able to get out of bed, walk around indoors and cough well. You may stop using the incentive spirometer when instructed by your caregiver.  RISKS AND COMPLICATIONS  Take your time so you do not get dizzy or light-headed.  If you are in pain, you may need to take or ask for pain medication before doing incentive spirometry. It is harder to take a deep breath if you are having pain. AFTER USE  Rest and breathe slowly and easily.  It can be helpful to keep track of a log of your progress. Your caregiver can provide you with a simple table to help with this. If you are using the spirometer at home, follow these instructions: [place] IF:   You are having difficultly using the spirometer.  You have trouble using the spirometer as often as instructed.  Your pain medication is not giving enough relief while using the spirometer.  You develop fever of 100.5 F (38.1 C) or higher. SEEK IMMEDIATE MEDICAL CARE IF:   You cough up bloody sputum that had not been present before.  You develop fever of 102 F (38.9 C) or greater.  You develop worsening pain at or near the incision site. MAKE SURE YOU:   Understand these instructions.  Will watch your condition.  Will get help right away if you are not doing well or get worse. Document Released: 07/17/2006 Document Revised: 05/29/2011 Document Reviewed: 09/17/2006 Exit[employer] Patient Information 2014 [employer], [place].   ________________________________________________________________________  WHAT IS A BLOOD  TRANSFUSION? Blood Transfusion Information  A transfusion is the replacement of blood or some of its parts. Blood is made up of multiple cells which provide different functions.  Red blood cells carry oxygen and are used for blood loss replacement.  White blood cells fight against infection.  Platelets control bleeding.  Plasma helps clot blood.  Other blood products are  available for specialized needs, such as hemophilia or other clotting disorders. BEFORE THE TRANSFUSION  Who gives blood for transfusions?   Healthy volunteers who are fully evaluated to make sure their blood is safe. This is blood bank blood. Transfusion therapy is the safest it has ever been in the practice of medicine. Before blood is taken from a donor, a complete history is taken to make sure that person has no history of diseases nor engages in risky social behavior (examples are intravenous drug use or sexual activity with multiple partners). The donor's travel history is screened to minimize risk of transmitting infections, such as malaria. The donated blood is tested for signs of infectious diseases, such as HIV and hepatitis. The blood is then tested to be sure it is compatible with you in order to minimize the chance of a transfusion reaction. If you or a relative donates blood, this is often done in anticipation of surgery and is not appropriate for emergency situations. It takes many days to process the donated blood. RISKS AND COMPLICATIONS Although transfusion therapy is very safe and saves many lives, the main dangers of transfusion include:   Getting an infectious disease.  Developing a transfusion reaction. This is an allergic reaction to something in the blood you were given. Every precaution is taken to prevent this. The decision to have a blood transfusion has been considered carefully by your caregiver before blood is given. Blood is not given unless the benefits outweigh the risks. AFTER THE  TRANSFUSION  Right after receiving a blood transfusion, you will usually feel much better and more energetic. This is especially true if your red blood cells have gotten low (anemic). The transfusion raises the level of the red blood cells which carry oxygen, and this usually causes an energy increase.  The nurse administering the transfusion will monitor you carefully for complications. HOME CARE INSTRUCTIONS  No special instructions are needed after a transfusion. You may find your energy is better. Speak with your caregiver about any limitations on activity for underlying diseases you may have. SEEK MEDICAL CARE IF:   Your condition is not improving after your transfusion.  You develop redness or irritation at the intravenous (IV) site. SEEK IMMEDIATE MEDICAL CARE IF:  Any of the following symptoms occur over the next 12 hours:  Shaking chills.  You have a temperature by mouth above 102 F (38.9 C), not controlled by medicine.  Chest, back, or muscle pain.  People around you feel you are not acting correctly or are confused.  Shortness of breath or difficulty breathing.  Dizziness and fainting.  You get a rash or develop hives.  You have a decrease in urine output.  Your urine turns a dark color or changes to pink, red, or brown. Any of the following symptoms occur over the next 10 days:  You have a temperature by mouth above 102 F (38.9 C), not controlled by medicine.  Shortness of breath.  Weakness after normal activity.  The white part of the eye turns yellow (jaundice).  You have a decrease in the amount of urine or are urinating less often.  Your urine turns a dark color or changes to pink, red, or brown. Document Released: 03/03/2000 Document Revised: 05/29/2011 Document Reviewed: 10/21/2007 [facility] Patient Information 2014 [place], [place].  _______________________________________________________________________

## 2015-01-06 ENCOUNTER — Encounter (HOSPITAL_COMMUNITY): Payer: Self-pay

## 2015-01-06 ENCOUNTER — Encounter (HOSPITAL_COMMUNITY)
Admission: RE | Admit: 2015-01-06 | Discharge: 2015-01-06 | Disposition: A | Discharge status: Home / Self Care | Payer: Medicare Other | Source: Ambulatory Visit | Attending: Orthopedic Surgery | Admitting: Orthopedic Surgery

## 2015-01-06 DIAGNOSIS — M1611 Unilateral primary osteoarthritis, right hip: Secondary | ICD-10-CM | POA: Insufficient documentation

## 2015-01-06 DIAGNOSIS — Z01818 Encounter for other preprocedural examination: Secondary | ICD-10-CM | POA: Diagnosis not present

## 2015-01-06 HISTORY — DX: Diverticulosis of intestine, part unspecified, without perforation or abscess without bleeding: K57.90

## 2015-01-06 HISTORY — DX: Malignant (primary) neoplasm, unspecified: C80.1

## 2015-01-06 HISTORY — DX: Personal history of urinary calculi: Z87.442

## 2015-01-06 HISTORY — DX: Personal history of other malignant neoplasm of skin: Z85.828

## 2015-01-06 HISTORY — DX: Unspecified osteoarthritis, unspecified site: M19.90

## 2015-01-06 LAB — PROTIME-INR
INR: 1.08 (ref 0.00–1.49)
PROTHROMBIN TIME: 14.2 s (ref 11.6–15.2)

## 2015-01-06 LAB — URINALYSIS, ROUTINE W REFLEX MICROSCOPIC
Bilirubin Urine: NEGATIVE
GLUCOSE, UA: NEGATIVE mg/dL
HGB URINE DIPSTICK: NEGATIVE
Ketones, ur: NEGATIVE mg/dL
LEUKOCYTES UA: NEGATIVE
Nitrite: NEGATIVE
PH: 6.5 (ref 5.0–8.0)
PROTEIN: NEGATIVE mg/dL
Specific Gravity, Urine: 1.01 (ref 1.005–1.030)
Urobilinogen, UA: 1 mg/dL (ref 0.0–1.0)

## 2015-01-06 LAB — SURGICAL PCR SCREEN
MRSA, PCR: NEGATIVE
Staphylococcus aureus: NEGATIVE

## 2015-01-06 LAB — APTT: aPTT: 30 seconds (ref 24–37)

## 2015-01-06 LAB — ABO/RH: ABO/RH(D): O POS

## 2015-01-12 ENCOUNTER — Ambulatory Visit: Payer: Self-pay | Admitting: Orthopedic Surgery

## 2015-01-12 ENCOUNTER — Encounter (HOSPITAL_COMMUNITY): Payer: Self-pay | Admitting: Orthopedic Surgery

## 2015-01-12 NOTE — H&P (Signed)
TOTAL HIP ADMISSION H&P  Patient is admitted for right total hip arthroplasty - anterior approach  Subjective:  Chief Complaint: right hip pain  HPI: Maria Schneider, 69 y.o. female, has a history of pain and functional disability in the right hip(s) due to arthritis and patient has failed non-surgical conservative treatments for greater than 12 weeks to include corticosteriod injections and activity modification.  Onset of symptoms was gradual  with gradually worsening course since that time.The patient noted no past surgery on the right hip(s).  Patient currently rates pain in the right hip at moderate level with activity. Patient has worsening of pain with activity and weight bearing. Patient has evidence of subchondral cysts, periarticular osteophytes and joint space narrowing by imaging studies. This condition presents safety issues increasing the risk of falls.  There is no current active infection.  Patient Active Problem List   Diagnosis Date Noted  . B12 deficiency 01/07/2014  . Bradycardia 01/07/2014  . Abnormal cholangiogram - CBD stones suspected 07/14/2013  . Gall stone pancreatitis 07/09/2013  . PYELONEPHRITIS, ACUTE 05/22/2009  . COSTOCHONDRITIS 04/28/2009  . HYPERLIPIDEMIA 04/12/2009  . DEPRESSION 04/12/2009  . Essential hypertension 04/12/2009  . ALLERGIC RHINITIS 04/12/2009  . ASTHMA 04/12/2009  . GERD 04/12/2009  . URINARY INCONTINENCE 04/12/2009   Past Medical History  Diagnosis Date  . Hyperlipidemia   . Hypertension   . Vitamin D deficiency   . Depression   . Allergy   . Asthma   . Urinary incontinence   . GERD (gastroesophageal reflux disease)   . Detrusor instability   . Osteopenia   . Arthritis   . History of skin cancer   . Diverticulosis     not symptomatic  . History of kidney stones   . Cancer ([place])     hx skin cancer  . History of pancreatitis     April 2015    Past Surgical History  Procedure Laterality Date  . Breast biopsy  1972   benign  . Colonoscopy  06-24-12    per Dr. Earlean Shawl, benign polyps, repeat in 5 yrs    . Tubal ligation    . Tonsillectomy    . Abdominal hysterectomy  1994    TAH/ BLADDER SUSPENSION  . Cholecystectomy N/A 07/12/2013    Procedure: LAPAROSCOPIC CHOLECYSTECTOMY WITH INTRAOPERATIVE CHOLANGIOGRAM;  Surgeon: Edward Jolly, MD;  Location: [facility];  Service: General;  Laterality: N/A;  . Ercp N/A 07/14/2013    Procedure: ENDOSCOPIC RETROGRADE CHOLANGIOPANCREATOGRAPHY (ERCP);  Surgeon: Gatha Mayer, MD;  Location: [facility];  Service: Endoscopy;  Laterality: N/A;    Medications: Aleve Tramadol Vit D3 Baby Aspirin 81 mg Fenofibrate Zetia Metoprolol Bupropion Sertraline  Allergies  Allergen Reactions  . Ace Inhibitors     REACTION: cough  . Codeine Nausea And Vomiting  . Fentanyl Itching  . Statins     REACTION: MYALGIAS-muscle pain    Social History  Substance Use Topics  . Smoking status: Former Smoker -- 42 years    Types: Cigarettes    Quit date: 07/09/2008  . Smokeless tobacco: Never Used     Comment: quit 6 months ago  . Alcohol Use: 1.2 oz/week    2 Glasses of wine, 0 Standard drinks or equivalent per week     Comment: occ    Family History  Problem Relation Age of Onset  . Hypertension Mother   . Osteoporosis Mother   . Heart disease Father   . Osteoporosis Sister   . Breast  cancer Paternal 67     Age 64's  . Diabetes Paternal Uncle      Review of Systems  Constitutional: Negative.   HENT: Negative.   Respiratory: Shortness of breath: on exertion.   Gastrointestinal: Positive for constipation.  Genitourinary: Positive for frequency.       Incontinence, nocturia  Musculoskeletal: Positive for back pain and joint pain.       Morning stiffnes  Neurological: Negative.     Objective:  Physical Exam  Constitutional: She appears well-developed and well-nourished. She is cooperative.  HENT:  Head: Normocephalic and atraumatic.  Eyes: EOM are  normal. Pupils are equal, round, and reactive to light.  Neck: Neck supple. Carotid bruit is not present.  Cardiovascular: Normal rate and regular rhythm.   No murmur heard. Respiratory: Effort normal and breath sounds normal. She has no wheezes. She has no rhonchi.  GI: Soft. Normal appearance. There is no tenderness.  Musculoskeletal:       Right hip: She exhibits decreased range of motion (flex 100, IR 10, ER 20, AB 20).       Left hip: Normal.  Neurological: She is alert.    Vital signs Ht 5'5" Wt 170 lbs BP 158/88  Estimated body mass index is 28.46 kg/(m^2) as calculated from the following:   Height as of 01/06/15: 5\' 5"  (1.651 m).   Weight as of 01/06/15: 77.565 kg (171 lb).   Imaging Review Plain radiographs demonstrate moderate degenerative joint disease of the right hip(s). The bone quality appears to be good for age and reported activity level.  Assessment/Plan:  End stage arthritis, right hip(s)  The patient history, physical examination, clinical judgement of the provider and imaging studies are consistent with end stage degenerative joint disease of the right hip(s) and total hip arthroplasty is deemed medically necessary. The treatment options including medical management, injection therapy, arthroscopy and arthroplasty were discussed at length. The risks and benefits of total hip arthroplasty were presented and reviewed. The risks due to aseptic loosening, infection, stiffness, dislocation/subluxation,  thromboembolic complications and other imponderables were discussed.  The patient acknowledged the explanation, agreed to proceed with the plan and consent was signed. Patient is being admitted for inpatient treatment for surgery, pain control, PT, OT, prophylactic antibiotics, VTE prophylaxis, progressive ambulation and ADL's and discharge planning.The patient is planning to be discharged home with home health services

## 2015-01-13 ENCOUNTER — Inpatient Hospital Stay (HOSPITAL_COMMUNITY): Payer: Medicare Other | Admitting: Anesthesiology

## 2015-01-13 ENCOUNTER — Encounter (HOSPITAL_COMMUNITY)
Admission: RE | Disposition: A | Discharge status: Home Health | Payer: Self-pay | Source: Ambulatory Visit | Attending: Orthopedic Surgery

## 2015-01-13 ENCOUNTER — Encounter (HOSPITAL_COMMUNITY): Payer: Self-pay | Admitting: *Deleted

## 2015-01-13 ENCOUNTER — Inpatient Hospital Stay (HOSPITAL_COMMUNITY): Payer: Medicare Other

## 2015-01-13 ENCOUNTER — Inpatient Hospital Stay (HOSPITAL_COMMUNITY)
Admission: RE | Admit: 2015-01-13 | Discharge: 2015-01-15 | DRG: 470 | Disposition: A | Discharge status: Home Health | Payer: Medicare Other | Source: Ambulatory Visit | Attending: Orthopedic Surgery | Admitting: Orthopedic Surgery

## 2015-01-13 DIAGNOSIS — Z87891 Personal history of nicotine dependence: Secondary | ICD-10-CM

## 2015-01-13 DIAGNOSIS — M169 Osteoarthritis of hip, unspecified: Secondary | ICD-10-CM | POA: Diagnosis present

## 2015-01-13 DIAGNOSIS — Z8249 Family history of ischemic heart disease and other diseases of the circulatory system: Secondary | ICD-10-CM | POA: Diagnosis not present

## 2015-01-13 DIAGNOSIS — E559 Vitamin D deficiency, unspecified: Secondary | ICD-10-CM | POA: Diagnosis present

## 2015-01-13 DIAGNOSIS — F329 Major depressive disorder, single episode, unspecified: Secondary | ICD-10-CM | POA: Diagnosis present

## 2015-01-13 DIAGNOSIS — Z96649 Presence of unspecified artificial hip joint: Secondary | ICD-10-CM

## 2015-01-13 DIAGNOSIS — M1611 Unilateral primary osteoarthritis, right hip: Principal | ICD-10-CM | POA: Diagnosis present

## 2015-01-13 DIAGNOSIS — Z01812 Encounter for preprocedural laboratory examination: Secondary | ICD-10-CM

## 2015-01-13 DIAGNOSIS — M25551 Pain in right hip: Secondary | ICD-10-CM | POA: Diagnosis present

## 2015-01-13 DIAGNOSIS — Z79899 Other long term (current) drug therapy: Secondary | ICD-10-CM

## 2015-01-13 DIAGNOSIS — E785 Hyperlipidemia, unspecified: Secondary | ICD-10-CM | POA: Diagnosis present

## 2015-01-13 DIAGNOSIS — J45909 Unspecified asthma, uncomplicated: Secondary | ICD-10-CM | POA: Diagnosis present

## 2015-01-13 DIAGNOSIS — Z7982 Long term (current) use of aspirin: Secondary | ICD-10-CM | POA: Diagnosis not present

## 2015-01-13 DIAGNOSIS — I10 Essential (primary) hypertension: Secondary | ICD-10-CM | POA: Diagnosis present

## 2015-01-13 DIAGNOSIS — K219 Gastro-esophageal reflux disease without esophagitis: Secondary | ICD-10-CM | POA: Diagnosis present

## 2015-01-13 DIAGNOSIS — Z90710 Acquired absence of both cervix and uterus: Secondary | ICD-10-CM | POA: Diagnosis not present

## 2015-01-13 HISTORY — PX: TOTAL HIP ARTHROPLASTY: SHX124

## 2015-01-13 HISTORY — DX: Personal history of other diseases of the digestive system: Z87.19

## 2015-01-13 LAB — TYPE AND SCREEN
ABO/RH(D): O POS
Antibody Screen: NEGATIVE

## 2015-01-13 SURGERY — ARTHROPLASTY, HIP, TOTAL, ANTERIOR APPROACH
Anesthesia: Monitor Anesthesia Care | Site: Hip | Laterality: Right

## 2015-01-13 MED ORDER — PROPOFOL 10 MG/ML IV BOLUS
INTRAVENOUS | Status: AC
  Filled 2015-01-13: qty 20

## 2015-01-13 MED ORDER — HYDROMORPHONE HCL 1 MG/ML IJ SOLN
0.2500 mg | INTRAMUSCULAR | Status: DC | PRN
  Administered 2015-01-13 (×2): 0.5 mg via INTRAVENOUS

## 2015-01-13 MED ORDER — PHENOL 1.4 % MT LIQD: 1.0000 | OROMUCOSAL | Status: DC | PRN

## 2015-01-13 MED ORDER — EZETIMIBE 10 MG PO TABS
10.0000 mg | ORAL_TABLET | Freq: Every day | ORAL | Status: DC
  Administered 2015-01-14 – 2015-01-15 (×2): 10 mg via ORAL
  Filled 2015-01-13 (×2): qty 1

## 2015-01-13 MED ORDER — BUPIVACAINE HCL (PF) 0.5 % IJ SOLN
INTRAMUSCULAR | Status: DC | PRN
  Administered 2015-01-13: 15 mg via INTRATHECAL

## 2015-01-13 MED ORDER — BUPROPION HCL ER (XL) 300 MG PO TB24
300.0000 mg | ORAL_TABLET | Freq: Every day | ORAL | Status: DC
  Administered 2015-01-14 – 2015-01-15 (×2): 300 mg via ORAL
  Filled 2015-01-13 (×2): qty 1

## 2015-01-13 MED ORDER — HYDROMORPHONE HCL 1 MG/ML IJ SOLN: 0.5000 mg | INTRAMUSCULAR | Status: DC | PRN

## 2015-01-13 MED ORDER — FENTANYL CITRATE (PF) 100 MCG/2ML IJ SOLN
INTRAMUSCULAR | Status: DC | PRN
  Administered 2015-01-13 (×2): 50 ug via INTRAVENOUS

## 2015-01-13 MED ORDER — EPHEDRINE SULFATE 50 MG/ML IJ SOLN
INTRAMUSCULAR | Status: DC | PRN
  Administered 2015-01-13: 5 mg via INTRAVENOUS

## 2015-01-13 MED ORDER — SERTRALINE HCL 100 MG PO TABS
200.0000 mg | ORAL_TABLET | Freq: Every day | ORAL | Status: DC
  Administered 2015-01-14 – 2015-01-15 (×2): 200 mg via ORAL
  Filled 2015-01-13 (×2): qty 2

## 2015-01-13 MED ORDER — BUPIVACAINE HCL (PF) 0.25 % IJ SOLN
INTRAMUSCULAR | Status: DC | PRN
  Administered 2015-01-13: 30 mL

## 2015-01-13 MED ORDER — HYDROMORPHONE HCL 1 MG/ML IJ SOLN
INTRAMUSCULAR | Status: AC
  Filled 2015-01-13: qty 1

## 2015-01-13 MED ORDER — TRANEXAMIC ACID 1000 MG/10ML IV SOLN
1000.0000 mg | INTRAVENOUS | Status: AC
  Administered 2015-01-13: 1000 mg via INTRAVENOUS
  Filled 2015-01-13: qty 10

## 2015-01-13 MED ORDER — METHOCARBAMOL 1000 MG/10ML IJ SOLN
500.0000 mg | Freq: Four times a day (QID) | INTRAMUSCULAR | Status: DC | PRN
  Administered 2015-01-13: 500 mg via INTRAVENOUS
  Filled 2015-01-13 (×2): qty 5

## 2015-01-13 MED ORDER — MIDAZOLAM HCL 5 MG/5ML IJ SOLN
INTRAMUSCULAR | Status: DC | PRN
  Administered 2015-01-13: 2 mg via INTRAVENOUS

## 2015-01-13 MED ORDER — DOCUSATE SODIUM 100 MG PO CAPS
100.0000 mg | ORAL_CAPSULE | Freq: Two times a day (BID) | ORAL | Status: DC
  Administered 2015-01-13 – 2015-01-15 (×4): 100 mg via ORAL

## 2015-01-13 MED ORDER — FENTANYL CITRATE (PF) 100 MCG/2ML IJ SOLN
INTRAMUSCULAR | Status: AC
  Filled 2015-01-13: qty 4

## 2015-01-13 MED ORDER — ONDANSETRON HCL 4 MG/2ML IJ SOLN
INTRAMUSCULAR | Status: AC
  Filled 2015-01-13: qty 2

## 2015-01-13 MED ORDER — SODIUM CHLORIDE 0.9 % IV SOLN: INTRAVENOUS | Status: DC

## 2015-01-13 MED ORDER — BUPIVACAINE HCL (PF) 0.25 % IJ SOLN
INTRAMUSCULAR | Status: AC
  Filled 2015-01-13: qty 30

## 2015-01-13 MED ORDER — KETOROLAC TROMETHAMINE 15 MG/ML IJ SOLN
7.5000 mg | Freq: Four times a day (QID) | INTRAMUSCULAR | Status: AC | PRN
  Administered 2015-01-13: 7.5 mg via INTRAVENOUS
  Filled 2015-01-13: qty 1

## 2015-01-13 MED ORDER — ACETAMINOPHEN 650 MG RE SUPP: 650.0000 mg | Freq: Four times a day (QID) | RECTAL | Status: DC | PRN

## 2015-01-13 MED ORDER — ONDANSETRON HCL 4 MG/2ML IJ SOLN: 4.0000 mg | Freq: Four times a day (QID) | INTRAMUSCULAR | Status: DC | PRN

## 2015-01-13 MED ORDER — ACETAMINOPHEN 10 MG/ML IV SOLN
INTRAVENOUS | Status: AC
  Filled 2015-01-13: qty 100

## 2015-01-13 MED ORDER — DEXAMETHASONE SODIUM PHOSPHATE 10 MG/ML IJ SOLN
10.0000 mg | Freq: Once | INTRAMUSCULAR | Status: AC
  Administered 2015-01-14: 10 mg via INTRAVENOUS
  Filled 2015-01-13: qty 1

## 2015-01-13 MED ORDER — ONDANSETRON HCL 4 MG PO TABS: 4.0000 mg | ORAL_TABLET | Freq: Four times a day (QID) | ORAL | Status: DC | PRN

## 2015-01-13 MED ORDER — ACETAMINOPHEN 10 MG/ML IV SOLN
1000.0000 mg | Freq: Once | INTRAVENOUS | Status: AC
  Administered 2015-01-13: 1000 mg via INTRAVENOUS

## 2015-01-13 MED ORDER — DIPHENHYDRAMINE HCL 12.5 MG/5ML PO ELIX: 12.5000 mg | ORAL_SOLUTION | ORAL | Status: DC | PRN

## 2015-01-13 MED ORDER — METOCLOPRAMIDE HCL 10 MG PO TABS: 5.0000 mg | ORAL_TABLET | Freq: Three times a day (TID) | ORAL | Status: DC | PRN

## 2015-01-13 MED ORDER — CEFAZOLIN SODIUM-DEXTROSE 2-3 GM-% IV SOLR
INTRAVENOUS | Status: AC
  Filled 2015-01-13: qty 50

## 2015-01-13 MED ORDER — MIDAZOLAM HCL 2 MG/2ML IJ SOLN
INTRAMUSCULAR | Status: AC
Start: 2015-01-13 — End: 2015-01-13
  Filled 2015-01-13: qty 4

## 2015-01-13 MED ORDER — CEFAZOLIN SODIUM-DEXTROSE 2-3 GM-% IV SOLR
2.0000 g | INTRAVENOUS | Status: AC
  Administered 2015-01-13: 2 g via INTRAVENOUS

## 2015-01-13 MED ORDER — CEFAZOLIN SODIUM-DEXTROSE 2-3 GM-% IV SOLR
2.0000 g | Freq: Four times a day (QID) | INTRAVENOUS | Status: AC
  Administered 2015-01-13 – 2015-01-14 (×2): 2 g via INTRAVENOUS
  Filled 2015-01-13 (×2): qty 50

## 2015-01-13 MED ORDER — 0.9 % SODIUM CHLORIDE (POUR BTL) OPTIME
TOPICAL | Status: DC | PRN
  Administered 2015-01-13: 1000 mL

## 2015-01-13 MED ORDER — MENTHOL 3 MG MT LOZG: 1.0000 | LOZENGE | OROMUCOSAL | Status: DC | PRN

## 2015-01-13 MED ORDER — FLEET ENEMA 7-19 GM/118ML RE ENEM: 1.0000 | ENEMA | Freq: Once | RECTAL | Status: DC | PRN

## 2015-01-13 MED ORDER — POLYETHYLENE GLYCOL 3350 17 G PO PACK: 17.0000 g | PACK | Freq: Every day | ORAL | Status: DC | PRN

## 2015-01-13 MED ORDER — HYDROMORPHONE HCL 2 MG PO TABS
2.0000 mg | ORAL_TABLET | ORAL | Status: DC | PRN
  Administered 2015-01-13 – 2015-01-14 (×7): 2 mg via ORAL
  Administered 2015-01-15 (×2): 4 mg via ORAL
  Administered 2015-01-15: 2 mg via ORAL
  Filled 2015-01-13 (×7): qty 1
  Filled 2015-01-13: qty 2
  Filled 2015-01-13 (×2): qty 1
  Filled 2015-01-13: qty 2

## 2015-01-13 MED ORDER — RIVAROXABAN 10 MG PO TABS
10.0000 mg | ORAL_TABLET | Freq: Every day | ORAL | Status: DC
  Administered 2015-01-14 – 2015-01-15 (×2): 10 mg via ORAL
  Filled 2015-01-13 (×3): qty 1

## 2015-01-13 MED ORDER — CHLORHEXIDINE GLUCONATE 4 % EX LIQD: 60.0000 mL | Freq: Once | CUTANEOUS | Status: DC

## 2015-01-13 MED ORDER — ACETAMINOPHEN 500 MG PO TABS
1000.0000 mg | ORAL_TABLET | Freq: Four times a day (QID) | ORAL | Status: AC
  Administered 2015-01-13 – 2015-01-14 (×3): 1000 mg via ORAL
  Filled 2015-01-13 (×4): qty 2

## 2015-01-13 MED ORDER — SODIUM CHLORIDE 0.9 % IV SOLN
INTRAVENOUS | Status: DC
  Administered 2015-01-13: 75 mL/h via INTRAVENOUS

## 2015-01-13 MED ORDER — PROPOFOL 500 MG/50ML IV EMUL
INTRAVENOUS | Status: DC | PRN
  Administered 2015-01-13: 100 ug/kg/min via INTRAVENOUS

## 2015-01-13 MED ORDER — LACTATED RINGERS IV SOLN
INTRAVENOUS | Status: DC
  Administered 2015-01-13: 16:00:00 via INTRAVENOUS
  Administered 2015-01-13: 1000 mL via INTRAVENOUS

## 2015-01-13 MED ORDER — DEXAMETHASONE SODIUM PHOSPHATE 10 MG/ML IJ SOLN
10.0000 mg | Freq: Once | INTRAMUSCULAR | Status: AC
  Administered 2015-01-13: 10 mg via INTRAVENOUS

## 2015-01-13 MED ORDER — BISACODYL 10 MG RE SUPP: 10.0000 mg | Freq: Every day | RECTAL | Status: DC | PRN

## 2015-01-13 MED ORDER — DEXAMETHASONE SODIUM PHOSPHATE 10 MG/ML IJ SOLN
INTRAMUSCULAR | Status: AC
  Filled 2015-01-13: qty 1

## 2015-01-13 MED ORDER — ACETAMINOPHEN 325 MG PO TABS
650.0000 mg | ORAL_TABLET | Freq: Four times a day (QID) | ORAL | Status: DC | PRN
  Administered 2015-01-15: 650 mg via ORAL
  Filled 2015-01-13: qty 2

## 2015-01-13 MED ORDER — METOPROLOL SUCCINATE ER 25 MG PO TB24
25.0000 mg | ORAL_TABLET | Freq: Every day | ORAL | Status: DC
  Administered 2015-01-14: 25 mg via ORAL
  Filled 2015-01-13 (×2): qty 1

## 2015-01-13 MED ORDER — PANTOPRAZOLE SODIUM 40 MG PO TBEC
40.0000 mg | DELAYED_RELEASE_TABLET | Freq: Every day | ORAL | Status: DC
  Administered 2015-01-14 – 2015-01-15 (×2): 40 mg via ORAL
  Filled 2015-01-13 (×2): qty 1

## 2015-01-13 MED ORDER — BUPIVACAINE LIPOSOME 1.3 % IJ SUSP
20.0000 mL | Freq: Once | INTRAMUSCULAR | Status: DC
  Filled 2015-01-13: qty 20

## 2015-01-13 MED ORDER — ONDANSETRON HCL 4 MG/2ML IJ SOLN
INTRAMUSCULAR | Status: DC | PRN
  Administered 2015-01-13: 4 mg via INTRAVENOUS

## 2015-01-13 MED ORDER — METHOCARBAMOL 500 MG PO TABS
500.0000 mg | ORAL_TABLET | Freq: Four times a day (QID) | ORAL | Status: DC | PRN
  Administered 2015-01-14 – 2015-01-15 (×3): 500 mg via ORAL
  Filled 2015-01-13 (×3): qty 1

## 2015-01-13 MED ORDER — METOCLOPRAMIDE HCL 5 MG/ML IJ SOLN: 5.0000 mg | Freq: Three times a day (TID) | INTRAMUSCULAR | Status: DC | PRN

## 2015-01-13 SURGICAL SUPPLY — 29 items
BAG DECANTER FOR FLEXI CONT (MISCELLANEOUS) ×2 IMPLANT
BAG SPEC THK2 15X12 ZIP CLS (MISCELLANEOUS)
BAG ZIPLOCK 12X15 (MISCELLANEOUS) IMPLANT
BLADE SAG 18X100X1.27 (BLADE) ×2 IMPLANT
CAPT HIP TOTAL 2 ×1 IMPLANT
CLOTH BEACON ORANGE TIMEOUT ST (SAFETY) ×1 IMPLANT
COVER PERINEAL POST (MISCELLANEOUS) ×2 IMPLANT
DRAPE STERI IOBAN 125X83 (DRAPES) ×2 IMPLANT
DRAPE U-SHAPE 47X51 STRL (DRAPES) ×4 IMPLANT
DRSG ADAPTIC 3X8 NADH LF (GAUZE/BANDAGES/DRESSINGS) ×2 IMPLANT
DRSG MEPILEX BORDER 4X4 (GAUZE/BANDAGES/DRESSINGS) ×2 IMPLANT
DRSG MEPILEX BORDER 4X8 (GAUZE/BANDAGES/DRESSINGS) ×2 IMPLANT
DURAPREP 26ML APPLICATOR (WOUND CARE) ×2 IMPLANT
ELECT REM PT RETURN 9FT ADLT (ELECTROSURGICAL) ×2
ELECTRODE REM PT RTRN 9FT ADLT (ELECTROSURGICAL) ×1 IMPLANT
EVACUATOR 1/8 PVC DRAIN (DRAIN) ×2 IMPLANT
GLOVE BIO SURGEON STRL SZ7.5 (GLOVE) ×2 IMPLANT
GLOVE BIO SURGEON STRL SZ8 (GLOVE) ×4 IMPLANT
GLOVE BIOGEL PI IND STRL 8 (GLOVE) ×2 IMPLANT
GLOVE BIOGEL PI INDICATOR 8 (GLOVE) ×2
GOWN STRL REUS W/TWL LRG LVL3 (GOWN DISPOSABLE) ×2 IMPLANT
GOWN STRL REUS W/TWL XL LVL3 (GOWN DISPOSABLE) ×2 IMPLANT
STRIP CLOSURE SKIN 1/2X4 (GAUZE/BANDAGES/DRESSINGS) ×2 IMPLANT
SUT ETHIBOND NAB CT1 #1 30IN (SUTURE) ×2 IMPLANT
SUT MNCRL AB 4-0 PS2 18 (SUTURE) ×3 IMPLANT
SUT VIC AB 2-0 CT1 27 (SUTURE) ×6
SUT VIC AB 2-0 CT1 TAPERPNT 27 (SUTURE) ×2 IMPLANT
SUT VLOC 180 0 24IN GS25 (SUTURE) ×2 IMPLANT
TRAY FOLEY W/METER SILVER 14FR (SET/KITS/TRAYS/PACK) ×2 IMPLANT

## 2015-01-13 NOTE — Anesthesia Postprocedure Evaluation (Signed)
  Anesthesia Post-op Note  Patient: Maria Schneider  Procedure(s) Performed: Procedure(s): RIGHT TOTAL HIP ARTHROPLASTY ANTERIOR APPROACH (Right)  Patient Location: PACU  Anesthesia Type:Spinal  Level of Consciousness: awake, alert  and oriented  Airway and Oxygen Therapy: Patient Spontanous Breathing  Post-op Pain: none  Post-op Assessment: Post-op Vital signs reviewed, Patient's Cardiovascular Status Stable and Respiratory Function Stable LLE Motor Response: No movement due to regional block LLE Sensation: No sensation (absent) RLE Motor Response: No movement due to regional block RLE Sensation: No sensation (absent) L Sensory Level: L4-Anterior knee, lower leg R Sensory Level: L4-Anterior knee, lower leg  Post-op Vital Signs: Reviewed and stable  Last Vitals:  Filed Vitals:   01/13/15 1835  BP: 152/69  Pulse: 65  Temp: 36.6 C  Resp: 16    Complications: No apparent anesthesia complications

## 2015-01-13 NOTE — H&P (View-Only) (Signed)
TOTAL HIP ADMISSION H&P  Patient is admitted for right total hip arthroplasty - anterior approach  Subjective:  Chief Complaint: right hip pain  HPI: Maria Schneider, 69 y.o. female, has a history of pain and functional disability in the right hip(s) due to arthritis and patient has failed non-surgical conservative treatments for greater than 12 weeks to include corticosteriod injections and activity modification.  Onset of symptoms was gradual  with gradually worsening course since that time.The patient noted no past surgery on the right hip(s).  Patient currently rates pain in the right hip at moderate level with activity. Patient has worsening of pain with activity and weight bearing. Patient has evidence of subchondral cysts, periarticular osteophytes and joint space narrowing by imaging studies. This condition presents safety issues increasing the risk of falls.  There is no current active infection.  Patient Active Problem List   Diagnosis Date Noted  . B12 deficiency 01/07/2014  . Bradycardia 01/07/2014  . Abnormal cholangiogram - CBD stones suspected 07/14/2013  . Gall stone pancreatitis 07/09/2013  . PYELONEPHRITIS, ACUTE 05/22/2009  . COSTOCHONDRITIS 04/28/2009  . HYPERLIPIDEMIA 04/12/2009  . DEPRESSION 04/12/2009  . Essential hypertension 04/12/2009  . ALLERGIC RHINITIS 04/12/2009  . ASTHMA 04/12/2009  . GERD 04/12/2009  . URINARY INCONTINENCE 04/12/2009   Past Medical History  Diagnosis Date  . Hyperlipidemia   . Hypertension   . Vitamin D deficiency   . Depression   . Allergy   . Asthma   . Urinary incontinence   . GERD (gastroesophageal reflux disease)   . Detrusor instability   . Osteopenia   . Arthritis   . History of skin cancer   . Diverticulosis     not symptomatic  . History of kidney stones   . Cancer ([place])     hx skin cancer  . History of pancreatitis     April 2015    Past Surgical History  Procedure Laterality Date  . Breast biopsy  1972   benign  . Colonoscopy  06-24-12    per Dr. Earlean Shawl, benign polyps, repeat in 5 yrs    . Tubal ligation    . Tonsillectomy    . Abdominal hysterectomy  1994    TAH/ BLADDER SUSPENSION  . Cholecystectomy N/A 07/12/2013    Procedure: LAPAROSCOPIC CHOLECYSTECTOMY WITH INTRAOPERATIVE CHOLANGIOGRAM;  Surgeon: Edward Jolly, MD;  Location: [facility];  Service: General;  Laterality: N/A;  . Ercp N/A 07/14/2013    Procedure: ENDOSCOPIC RETROGRADE CHOLANGIOPANCREATOGRAPHY (ERCP);  Surgeon: Gatha Mayer, MD;  Location: [facility];  Service: Endoscopy;  Laterality: N/A;    Medications: Aleve Tramadol Vit D3 Baby Aspirin 81 mg Fenofibrate Zetia Metoprolol Bupropion Sertraline  Allergies  Allergen Reactions  . Ace Inhibitors     REACTION: cough  . Codeine Nausea And Vomiting  . Fentanyl Itching  . Statins     REACTION: MYALGIAS-muscle pain    Social History  Substance Use Topics  . Smoking status: Former Smoker -- 42 years    Types: Cigarettes    Quit date: 07/09/2008  . Smokeless tobacco: Never Used     Comment: quit 6 months ago  . Alcohol Use: 1.2 oz/week    2 Glasses of wine, 0 Standard drinks or equivalent per week     Comment: occ    Family History  Problem Relation Age of Onset  . Hypertension Mother   . Osteoporosis Mother   . Heart disease Father   . Osteoporosis Sister   . Breast  cancer Paternal 23     Age 9's  . Diabetes Paternal Uncle      Review of Systems  Constitutional: Negative.   HENT: Negative.   Respiratory: Shortness of breath: on exertion.   Gastrointestinal: Positive for constipation.  Genitourinary: Positive for frequency.       Incontinence, nocturia  Musculoskeletal: Positive for back pain and joint pain.       Morning stiffnes  Neurological: Negative.     Objective:  Physical Exam  Constitutional: She appears well-developed and well-nourished. She is cooperative.  HENT:  Head: Normocephalic and atraumatic.  Eyes: EOM are  normal. Pupils are equal, round, and reactive to light.  Neck: Neck supple. Carotid bruit is not present.  Cardiovascular: Normal rate and regular rhythm.   No murmur heard. Respiratory: Effort normal and breath sounds normal. She has no wheezes. She has no rhonchi.  GI: Soft. Normal appearance. There is no tenderness.  Musculoskeletal:       Right hip: She exhibits decreased range of motion (flex 100, IR 10, ER 20, AB 20).       Left hip: Normal.  Neurological: She is alert.    Vital signs Ht 5'5" Wt 170 lbs BP 158/88  Estimated body mass index is 28.46 kg/(m^2) as calculated from the following:   Height as of 01/06/15: 5\' 5"  (1.651 m).   Weight as of 01/06/15: 77.565 kg (171 lb).   Imaging Review Plain radiographs demonstrate moderate degenerative joint disease of the right hip(s). The bone quality appears to be good for age and reported activity level.  Assessment/Plan:  End stage arthritis, right hip(s)  The patient history, physical examination, clinical judgement of the provider and imaging studies are consistent with end stage degenerative joint disease of the right hip(s) and total hip arthroplasty is deemed medically necessary. The treatment options including medical management, injection therapy, arthroscopy and arthroplasty were discussed at length. The risks and benefits of total hip arthroplasty were presented and reviewed. The risks due to aseptic loosening, infection, stiffness, dislocation/subluxation,  thromboembolic complications and other imponderables were discussed.  The patient acknowledged the explanation, agreed to proceed with the plan and consent was signed. Patient is being admitted for inpatient treatment for surgery, pain control, PT, OT, prophylactic antibiotics, VTE prophylaxis, progressive ambulation and ADL's and discharge planning.The patient is planning to be discharged home with home health services

## 2015-01-13 NOTE — Anesthesia Preprocedure Evaluation (Signed)
Anesthesia Evaluation  Patient identified by MRN, date of birth, ID band Patient awake    Reviewed: Allergy & Precautions, NPO status , Patient's Chart, lab work & pertinent test results  Airway Mallampati: II   Neck ROM: full    Dental   Pulmonary asthma , former smoker,    breath sounds clear to auscultation       Cardiovascular hypertension,  Rhythm:regular Rate:Normal     Neuro/Psych Depression    GI/Hepatic GERD  ,  Endo/Other    Renal/GU      Musculoskeletal  (+) Arthritis ,   Abdominal   Peds  Hematology   Anesthesia Other Findings   Reproductive/Obstetrics                             Anesthesia Physical Anesthesia Plan  ASA: II  Anesthesia Plan: MAC and Spinal   Post-op Pain Management:    Induction: Intravenous  Airway Management Planned: Simple Face Mask  Additional Equipment:   Intra-op Plan:   Post-operative Plan:   Informed Consent: I have reviewed the patients History and Physical, chart, labs and discussed the procedure including the risks, benefits and alternatives for the proposed anesthesia with the patient or authorized representative who has indicated his/her understanding and acceptance.     Plan Discussed with: CRNA, Anesthesiologist and Surgeon  Anesthesia Plan Comments:         Anesthesia Quick Evaluation

## 2015-01-13 NOTE — Op Note (Signed)
OPERATIVE REPORT  PREOPERATIVE DIAGNOSIS: Osteoarthritis of the Right hip.   POSTOPERATIVE DIAGNOSIS: Osteoarthritis of the Right  hip.   PROCEDURE: Right total hip arthroplasty, anterior approach.   SURGEON: Gaynelle Arabian, MD   ASSISTANT: [name], PA-C  ANESTHESIA:  Spinal  ESTIMATED BLOOD LOSS:-200 nl    DRAINS: Hemovac x1.   COMPLICATIONS: None   CONDITION: PACU - hemodynamically stable.   BRIEF CLINICAL NOTE: Maria Schneider is a 69 y.o. female who has advanced end-  stage arthritis of her Right  hip with progressively worsening pain and  dysfunction.The patient has failed nonoperative management and presents for  total hip arthroplasty.   PROCEDURE IN DETAIL: After successful administration of spinal  anesthetic, the traction boots for the [facility] bed were placed on both  feet and the patient was placed onto the [facility] bed, boots placed into the leg  holders. The Right hip was then isolated from the perineum with plastic  drapes and prepped and draped in the usual sterile fashion. ASIS and  greater trochanter were marked and a oblique incision was made, starting  at about 1 cm lateral and 2 cm distal to the ASIS and coursing towards  the anterior cortex of the femur. The skin was cut with a 10 blade  through subcutaneous tissue to the level of the fascia overlying the  tensor fascia lata muscle. The fascia was then incised in line with the  incision at the junction of the anterior third and posterior 2/3rd. The  muscle was teased off the fascia and then the interval between the TFL  and the rectus was developed. The Hohmann retractor was then placed at  the top of the femoral neck over the capsule. The vessels overlying the  capsule were cauterized and the fat on top of the capsule was removed.  A Hohmann retractor was then placed anterior underneath the rectus  femoris to give exposure to the entire anterior capsule. A T-shaped  capsulotomy was performed.  The edges were tagged and the femoral head  was identified.       Osteophytes are removed off the superior acetabulum.  The femoral neck was then cut in situ with an oscillating saw. Traction  was then applied to the left lower extremity utilizing the [facility]  traction. The femoral head was then removed. Retractors were placed  around the acetabulum and then circumferential removal of the labrum was  performed. Osteophytes were also removed. Reaming starts at 45 mm to  medialize and  Increased in 2 mm increments to 47 mm. We reamed in  approximately 40 degrees of abduction, 20 degrees anteversion. A 48 mm  pinnacle acetabular shell was then impacted in anatomic position under  fluoroscopic guidance with excellent purchase. We did not need to place  any additional dome screws. A 28 mm neutral + 4 marathon liner was then  placed into the acetabular shell.       The femoral lift was then placed along the lateral aspect of the femur  just distal to the vastus ridge. The leg was  externally rotated and capsule  was stripped off the inferior aspect of the femoral neck down to the  level of the lesser trochanter, this was done with electrocautery. The femur was lifted after this was performed. The  leg was then placed and extended in adducted position to essentially delivering the femur. We also removed the capsule superiorly and the  piriformis from the  piriformis fossa to gain excellent exposure of the  proximal femur. Rongeur was used to remove some cancellous bone to get  into the lateral portion of the proximal femur for placement of the  initial starter reamer. The starter broaches was placed  the starter broach  and was shown to go down the center of the canal. Broaching  with the  Corail system was then performed starting at size 8, coursing  Up to size 10. A size 10 had excellent torsional and rotational  and axial stability. The trial standard offset neck was then placed  with a 28 + 1.5  trial head. The hip was then reduced. We confirmed that  the stem was in the canal both on AP and lateral x-rays. It also has excellent sizing. The hip was reduced with outstanding stability through full extension, full external rotation,  and then flexion in adduction internal rotation. AP pelvis was taken  and the leg lengths were measured and found to be exactly equal. Hip  was then dislocated again and the femoral head and neck removed. The  femoral broach was removed. Size 10 Corail stem with a standard offset  neck was then impacted into the femur following native anteversion. Has  excellent purchase in the canal. Excellent torsional and rotational and  axial stability. It is confirmed to be in the canal on AP and lateral  fluoroscopic views. The 28 + 1.5 ceramic head was placed and the hip  reduced with outstanding stability. Again AP pelvis was taken and it  confirmed that the leg lengths were equal. The wound was then copiously  irrigated with saline solution and the capsule reattached and repaired  with Ethibond suture. 30 ml of .25% Bupivicaine injected into the capsule and into the edge of the tensor fascia lata as well as subcutaneous tissue. The fascia overlying the tensor fascia lata was  then closed with a running #1 V-Loc. Subcu was closed with interrupted  2-0 Vicryl and subcuticular running 4-0 Monocryl. Incision was cleaned  and dried. Steri-Strips and a bulky sterile dressing applied. Hemovac  drain was hooked to suction and then he was awakened and transported to  recovery in stable condition.        Please note that a surgical assistant was a medical necessity for this procedure to perform it in a safe and expeditious manner. Assistant was necessary to provide appropriate retraction of vital neurovascular structures and to prevent femoral fracture and allow for anatomic placement of the prosthesis.  Gaynelle Arabian, M.D.

## 2015-01-13 NOTE — Interval H&P Note (Signed)
History and Physical Interval Note:  01/13/2015 2:40 PM  Maria Schneider  has presented today for surgery, with the diagnosis of OA RIGHT HIP  The various methods of treatment have been discussed with the patient and family. After consideration of risks, benefits and other options for treatment, the patient has consented to  Procedure(s): RIGHT TOTAL HIP ARTHROPLASTY ANTERIOR APPROACH (Right) as a surgical intervention .  The patient's history has been reviewed, patient examined, no change in status, stable for surgery.  I have reviewed the patient's chart and labs.  Questions were answered to the patient's satisfaction.     Gearlean Alf

## 2015-01-13 NOTE — Transfer of Care (Signed)
Immediate Anesthesia Transfer of Care Note  Patient: Maria Schneider  Procedure(s) Performed: Procedure(s): RIGHT TOTAL HIP ARTHROPLASTY ANTERIOR APPROACH (Right)  Patient Location: PACU  Anesthesia Type:Spinal  Level of Consciousness:  sedated, patient cooperative and responds to stimulation  Airway & Oxygen Therapy:Patient Spontanous Breathing and Patient connected to face mask oxgen  Post-op Assessment:  Report given to PACU RN and Post -op Vital signs reviewed and stable  Post vital signs:  Reviewed and stable, T12 spinal level Last Vitals:  Filed Vitals:   01/13/15 1210  BP: 153/69  Pulse: 66  Temp: 36.6 C  Resp: 18    Complications: No apparent anesthesia complications

## 2015-01-13 NOTE — Anesthesia Procedure Notes (Signed)
Spinal Patient location during procedure: OR Start time: 01/13/2015 2:50 PM End time: 01/13/2015 2:53 PM Staffing Resident/CRNA: Harle Stanford R Performed by: resident/CRNA  Preanesthetic Checklist Completed: patient identified, site marked, surgical consent, pre-op evaluation, timeout performed, IV checked, risks and benefits discussed and monitors and equipment checked Spinal Block Patient position: sitting Prep: Betadine Patient monitoring: heart rate, cardiac monitor, continuous pulse ox and blood pressure Approach: midline Location: L3-4 Injection technique: single-shot Needle Needle type: Sprotte  Needle gauge: 24 G Needle length: 10 cm Needle insertion depth: 7 cm Assessment Sensory level: T6 Additional Notes Spinal kit date checked. SAB without difficulty.Some scoliosis noted in lower back.

## 2015-01-14 ENCOUNTER — Encounter (HOSPITAL_COMMUNITY): Payer: Self-pay | Admitting: Orthopedic Surgery

## 2015-01-14 LAB — BASIC METABOLIC PANEL
Anion gap: 9 (ref 5–15)
BUN: 13 mg/dL (ref 6–20)
CALCIUM: 8.8 mg/dL — AB (ref 8.9–10.3)
CO2: 26 mmol/L (ref 22–32)
Chloride: 105 mmol/L (ref 101–111)
Creatinine, Ser: 0.9 mg/dL (ref 0.44–1.00)
GFR calc Af Amer: >60 mL/min (ref >60)
GLUCOSE: 140 mg/dL — AB (ref 65–99)
Potassium: 4.7 mmol/L (ref 3.5–5.1)
Sodium: 140 mmol/L (ref 135–145)

## 2015-01-14 LAB — CBC
HCT: 31.7 % — ABNORMAL LOW (ref 36.0–46.0)
Hemoglobin: 10.1 g/dL — ABNORMAL LOW (ref 12.0–15.0)
MCH: 27.7 pg (ref 26.0–34.0)
MCHC: 31.9 g/dL (ref 30.0–36.0)
MCV: 87.1 fL (ref 78.0–100.0)
PLATELETS: 148 10*3/uL — AB (ref 150–400)
RBC: 3.64 MIL/uL — ABNORMAL LOW (ref 3.87–5.11)
RDW: 13.2 % (ref 11.5–15.5)
WBC: 6.4 10*3/uL (ref 4.0–10.5)

## 2015-01-14 MED ORDER — METHOCARBAMOL 500 MG PO TABS: 500.0000 mg | ORAL_TABLET | Freq: Four times a day (QID) | ORAL | Status: DC | PRN

## 2015-01-14 MED ORDER — LIP MEDEX EX OINT
TOPICAL_OINTMENT | CUTANEOUS | Status: AC
  Administered 2015-01-14: 1
  Filled 2015-01-14: qty 7

## 2015-01-14 MED ORDER — RIVAROXABAN 10 MG PO TABS: 10.0000 mg | ORAL_TABLET | Freq: Every day | ORAL | Status: DC

## 2015-01-14 MED ORDER — TRAMADOL HCL 50 MG PO TABS
50.0000 mg | ORAL_TABLET | Freq: Four times a day (QID) | ORAL | Status: DC | PRN
  Administered 2015-01-14: 100 mg via ORAL
  Filled 2015-01-14: qty 2

## 2015-01-14 MED ORDER — HYDROMORPHONE HCL 2 MG PO TABS: 2.0000 mg | ORAL_TABLET | ORAL | Status: DC | PRN

## 2015-01-14 NOTE — Discharge Instructions (Signed)
° °Dr. Frank Aluisio °Total Joint Specialist °[facility] °[address] °[place], [place] [address] °[phone] ° °ANTERIOR APPROACH TOTAL HIP REPLACEMENT POSTOPERATIVE DIRECTIONS ° ° °Hip Rehabilitation, Guidelines Following Surgery  °The results of a hip operation are greatly improved after range of motion and muscle strengthening exercises. Follow all safety measures which are given to protect your hip. If any of these exercises cause increased pain or swelling in your joint, decrease the amount until you are comfortable again. Then slowly increase the exercises. Call your caregiver if you have problems or questions.  ° °HOME CARE INSTRUCTIONS  °Remove items at home which could result in a fall. This includes throw rugs or furniture in walking pathways.  °· ICE to the affected hip every three hours for 30 minutes at a time and then as needed for pain and swelling.  Continue to use ice on the hip for pain and swelling from surgery. You may notice swelling that will progress down to the foot and ankle.  This is normal after surgery.  Elevate the leg when you are not up walking on it.   °· Continue to use the breathing machine which will help keep your temperature down.  It is common for your temperature to cycle up and down following surgery, especially at night when you are not up moving around and exerting yourself.  The breathing machine keeps your lungs expanded and your temperature down. ° ° °DIET °You may resume your previous home diet once your are discharged from the hospital. ° °DRESSING / WOUND CARE / SHOWERING °You may shower 3 days after surgery, but keep the wounds dry during showering.  You may use an occlusive plastic wrap (Press'n Seal for example), NO SOAKING/SUBMERGING IN THE BATHTUB.  If the bandage gets wet, change with a clean dry gauze.  If the incision gets wet, pat the wound dry with a clean towel. °You may start showering once you are discharged home but do not  submerge the incision under water. Just pat the incision dry and apply a dry gauze dressing on daily. °Change the surgical dressing daily and reapply a dry dressing each time. ° °ACTIVITY °Walk with your walker as instructed. °Use walker as long as suggested by your caregivers. °Avoid periods of inactivity such as sitting longer than an hour when not asleep. This helps prevent blood clots.  °You may resume a sexual relationship in one month or when given the OK by your doctor.  °You may return to work once you are cleared by your doctor.  °Do not drive a car for 6 weeks or until released by you surgeon.  °Do not drive while taking narcotics. ° °WEIGHT BEARING °Weight bearing as tolerated with assist device (walker, cane, etc) as directed, use it as long as suggested by your surgeon or therapist, typically at least 4-6 weeks. ° °POSTOPERATIVE CONSTIPATION PROTOCOL °Constipation - defined medically as fewer than three stools per week and severe constipation as less than one stool per week. ° °One of the most common issues patients have following surgery is constipation.  Even if you have a regular bowel pattern at home, your normal regimen is likely to be disrupted due to multiple reasons following surgery.  Combination of anesthesia, postoperative narcotics, change in appetite and fluid intake all can affect your bowels.  In order to avoid complications following surgery, here are some recommendations in order to help you during your recovery period. ° °Colace (docusate) - Pick up an over-the-counter   form of Colace or another stool softener and take twice a day as long as you are requiring postoperative pain medications.  Take with a full glass of water daily.  If you experience loose stools or diarrhea, hold the colace until you stool forms back up.  If your symptoms do not get better within 1 week or if they get worse, check with your doctor. ° °Dulcolax (bisacodyl) - Pick up over-the-counter and take as directed  by the product packaging as needed to assist with the movement of your bowels.  Take with a full glass of water.  Use this product as needed if not relieved by Colace only.  ° °MiraLax (polyethylene glycol) - Pick up over-the-counter to have on hand.  MiraLax is a solution that will increase the amount of water in your bowels to assist with bowel movements.  Take as directed and can mix with a glass of water, juice, soda, coffee, or tea.  Take if you go more than two days without a movement. °Do not use MiraLax more than once per day. Call your doctor if you are still constipated or irregular after using this medication for 7 days in a row. ° °If you continue to have problems with postoperative constipation, please contact the office for further assistance and recommendations.  If you experience "the worst abdominal pain ever" or develop nausea or vomiting, please contact the office immediatly for further recommendations for treatment. ° °ITCHING ° If you experience itching with your medications, try taking only a single pain pill, or even half a pain pill at a time.  You can also use Benadryl over the counter for itching or also to help with sleep.  ° °TED HOSE STOCKINGS °Wear the elastic stockings on both legs for three weeks following surgery during the day but you may remove then at night for sleeping. ° °MEDICATIONS °See your medication summary on the “After Visit Summary” that the nursing staff will review with you prior to discharge.  You may have some home medications which will be placed on hold until you complete the course of blood thinner medication.  It is important for you to complete the blood thinner medication as prescribed by your surgeon.  Continue your approved medications as instructed at time of discharge. ° °PRECAUTIONS °If you experience chest pain or shortness of breath - call 911 immediately for transfer to the hospital emergency department.  °If you develop a fever greater that 101 F,  purulent drainage from wound, increased redness or drainage from wound, foul odor from the wound/dressing, or calf pain - CONTACT YOUR SURGEON.   °                                                °FOLLOW-UP APPOINTMENTS °Make sure you keep all of your appointments after your operation with your surgeon and caregivers. You should call the office at the above phone number and make an appointment for approximately two weeks after the date of your surgery or on the date instructed by your surgeon outlined in the "After Visit Summary". ° °RANGE OF MOTION AND STRENGTHENING EXERCISES  °These exercises are designed to help you keep full movement of your hip joint. Follow your caregiver's or physical therapist's instructions. Perform all exercises about fifteen times, three times per day or as directed. Exercise both hips, even if you   have had only one joint replacement. These exercises can be done on a training (exercise) mat, on the floor, on a table or on a bed. Use whatever works the best and is most comfortable for you. Use music or television while you are exercising so that the exercises are a pleasant break in your day. This will make your life better with the exercises acting as a break in routine you can look forward to.  Lying on your back, slowly slide your foot toward your buttocks, raising your knee up off the floor. Then slowly slide your foot back down until your leg is straight again.  Lying on your back spread your legs as far apart as you can without causing discomfort.  Lying on your side, raise your upper leg and foot straight up from the floor as far as is comfortable. Slowly lower the leg and repeat.  Lying on your back, tighten up the muscle in the front of your thigh (quadriceps muscles). You can do this by keeping your leg straight and trying to raise your heel off the floor. This helps strengthen the largest muscle supporting your knee.  Lying on your back, tighten up the muscles of your  buttocks both with the legs straight and with the knee bent at a comfortable angle while keeping your heel on the floor.   IF YOU ARE TRANSFERRED TO A SKILLED REHAB FACILITY If the patient is transferred to a skilled rehab facility following release from the hospital, a list of the current medications will be sent to the facility for the patient to continue.  When discharged from the skilled rehab facility, please have the facility set up the patient's [place] prior to being released. Also, the skilled facility will be responsible for providing the patient with their medications at time of release from the facility to include their pain medication, the muscle relaxants, and their blood thinner medication. If the patient is still at the rehab facility at time of the two week follow up appointment, the skilled rehab facility will also need to assist the patient in arranging follow up appointment in our office and any transportation needs.  MAKE SURE YOU:  Understand these instructions.  Get help right away if you are not doing well or get worse.    Pick up stool softner and laxative for home use following surgery while on pain medications. Do not submerge incision under water. May remove the surgical dressing tomorrow, Friday 01/15/2015, and then apply a dry gauze dressing daily. Please use good hand washing techniques while changing dressing each day. May shower starting three days after surgery starting Saturday 01/16/2015. Please use a clean towel to pat the incision dry following showers. Continue to use ice for pain and swelling after surgery. Do not use any lotions or creams on the incision until instructed by your surgeon.   Take Xarelto for two and a half more weeks, then discontinue Xarelto. Once the patient has completed the blood thinner regimen, then take a Baby 81 mg Aspirin daily for three more weeks.   Information on my medicine - XARELTO  (Rivaroxaban)  This medication education was reviewed with me or my healthcare representative as part of my discharge preparation.  The pharmacist that spoke with me during my hospital stay was:  Lolita Patella, [facility]  Why was Xarelto prescribed for you? Xarelto was prescribed for you to reduce the risk of blood clots forming after orthopedic surgery. The medical term for these  abnormal blood clots is venous thromboembolism (VTE).  What do you need to know about xarelto ? Take your Xarelto ONCE DAILY at the same time every day. You may take it either with or without food.  If you have difficulty swallowing the tablet whole, you may crush it and mix in applesauce just prior to taking your dose.  Take Xarelto exactly as prescribed by your doctor and DO NOT stop taking Xarelto without talking to the doctor who prescribed the medication.  Stopping without other VTE prevention medication to take the place of Xarelto may increase your risk of developing a clot.  After discharge, you should have regular check-up appointments with your healthcare provider that is prescribing your Xarelto.    What do you do if you miss a dose? If you miss a dose, take it as soon as you remember on the same day then continue your regularly scheduled once daily regimen the next day. Do not take two doses of Xarelto on the same day.   Important Safety Information A possible side effect of Xarelto is bleeding. You should call your healthcare provider right away if you experience any of the following: ? Bleeding from an injury or your nose that does not stop. ? Unusual colored urine (red or dark brown) or unusual colored stools (red or black). ? Unusual bruising for unknown reasons. ? A serious fall or if you hit your head (even if there is no bleeding).  Some medicines may interact with Xarelto and might increase your risk of bleeding while on Xarelto. To help avoid this, consult your healthcare  provider or pharmacist prior to using any new prescription or non-prescription medications, including herbals, vitamins, non-steroidal anti-inflammatory drugs (NSAIDs) and supplements.  This website has more information on Xarelto: [email or web address]

## 2015-01-14 NOTE — Evaluation (Signed)
Occupational Therapy Evaluation Patient Details Name: Maria Schneider MRN: [id number] DOB: 25-Aug-1945 Today's Date: 01/14/2015    History of Present Illness s/p R DA THA   Clinical Impression   This 69 year old female was admitted for the above surgery. Evaluation was limited by R knee pain and she kept RLE mostly non-weight bearing. Will follow in acute setting with min guard to supervision level goals.  Pt was independent prior to admission    Follow Up Recommendations  Supervision/Assistance - 24 hour    Equipment Recommendations  None recommended by OT    Recommendations for Other Services       Precautions / Restrictions Precautions Precautions: Fall Restrictions Weight Bearing Restrictions: No      Mobility Bed Mobility Overal bed mobility: Needs Assistance Bed Mobility: Supine to Sit     Supine to sit: Min guard     General bed mobility comments: for safety  Transfers Overall transfer level: Needs assistance Equipment used: Rolling walker (2 wheeled) Transfers: Sit to/from [facility] Sit to Stand: Min assist Stand pivot transfers: Min assist       General transfer comment: cues for safety; cues for UE/LE placement    Balance                                            ADL Overall ADL's : Needs assistance/impaired     Grooming: Set up;Sitting   Upper Body Bathing: Set up;Sitting   Lower Body Bathing: Minimal assistance;Sit to/from stand   Upper Body Dressing : Set up;Sitting   Lower Body Dressing: Moderate assistance;Sit to/from stand   Toilet Transfer: Minimal assistance;Stand-pivot;BSC;RW   Toileting- Clothing Manipulation and Hygiene: Minimal assistance;Sit to/from stand         General ADL Comments: pt has urgency.  R knee hurting more than hip and pt did not place weight on RLE.  Transferred to [facility] and took a few steps to chair with cues for safety.  Pt was concerned about urgency: donned mesh  underwear and pad.  Educated on sidestepping through tight space into her bathroom. The bed downstairs is not high, but hers upstairs is.  Pt will borrow a reacher:  explained ADL uses.       Vision     Perception     Praxis      Pertinent Vitals/Pain Pain Assessment: 0-10 Pain Score: 7  Pain Location: R knee (not hip) Pain Descriptors / Indicators: Aching Pain Intervention(s): Limited activity within patient's tolerance;Monitored during session;Premedicated before session;Repositioned;Ice applied     Hand Dominance     Extremity/Trunk Assessment Upper Extremity Assessment Upper Extremity Assessment: Overall WFL for tasks assessed           Communication Communication Communication: No difficulties   Cognition Arousal/Alertness: Awake/alert Behavior During Therapy: WFL for tasks assessed/performed (pt moves very quickly; cued for safety) Overall Cognitive Status: Within Functional Limits for tasks assessed                     General Comments       Exercises       Shoulder Instructions      Home Living Family/patient expects to be discharged to:: Private residence Living Arrangements: Spouse/significant other Available Help at Discharge: Family;Available 24 hours/day Type of Home: House Home Access: Stairs to enter [employer] of Steps: 2   Home  Layout: Two level;Able to live on main level with bedroom/bathroom (plans first floor initially)         Bathroom Toilet: Standard     Home Equipment: Bedside commode;Walker - 2 wheels;Shower seat;Cane - single point   Additional Comments: tubs upstairs; will stay downstairs      Prior Functioning/Environment Level of Independence: Independent             OT Diagnosis: Generalized weakness   OT Problem List: Decreased strength;Decreased activity tolerance;Pain;Decreased knowledge of use of DME or AE   OT Treatment/Interventions: Self-care/ADL training;DME and/or AE  instruction;Patient/family education    OT Goals(Current goals can be found in the care plan section) Acute Rehab OT Goals Patient Stated Goal: decreased knee pain; regain independence OT Goal Formulation: With patient Time For Goal Achievement: 01/21/15 Potential to Achieve Goals: Good ADL Goals Pt Will Perform Grooming: with supervision;standing Pt Will Transfer to Toilet: with min guard assist;ambulating;bedside commode Pt Will Perform Toileting - Clothing Manipulation and hygiene: with min guard assist;sit to/from stand Additional ADL Goal #1: pt will not need any safety cues during adls/toilet transfers  OT Frequency: Min 2X/week   Barriers to D/C:            Co-evaluation              End of Session    Activity Tolerance: Patient limited by pain Patient left: in chair;with call bell/phone within reach;with nursing/sitter in room   Time: 5885-0277 OT Time Calculation (min): 27 min Charges:  OT General Charges $OT Visit: 1 Procedure OT Evaluation $Initial OT Evaluation Tier I: 1 Procedure OT Treatments $Self Care/Home Management : 8-22 mins G-Codes:    Maria Schneider 02/12/2015, 8:44 AM   Maria Schneider, OTR/L [phone] 02/12/2015

## 2015-01-14 NOTE — Progress Notes (Signed)
Physical Therapy Treatment Patient Details Name: Maria Schneider MRN: [id number] DOB: May 18, 1945 Today's Date: 01/14/2015    History of Present Illness s/p R DA THA    PT Comments    Pt progressing steadily with increased activity tolerance and slowly resolving R knee pain.  Follow Up Recommendations  Home health PT     Equipment Recommendations  None recommended by PT    Recommendations for Other Services OT consult     Precautions / Restrictions Precautions Precautions: Fall Restrictions Weight Bearing Restrictions: No    Mobility  Bed Mobility Overal bed mobility: Needs Assistance Bed Mobility: Supine to Sit;Sit to Supine     Supine to sit: Min guard Sit to supine: Min guard   General bed mobility comments: Cues for sequence and use of L LE to self assist  Transfers Overall transfer level: Needs assistance Equipment used: Rolling walker (2 wheeled) Transfers: Sit to/from Stand Sit to Stand: Min assist         General transfer comment: cues for safety; cues for UE/LE placement  Ambulation/Gait Ambulation/Gait assistance: Min assist;Min guard Ambulation Distance (Feet): 150 Feet Assistive device: Rolling walker (2 wheeled) Gait Pattern/deviations: Step-to pattern;Step-through pattern;Decreased step length - right;Decreased step length - left;Shuffle;Trunk flexed Gait velocity: decr   General Gait Details: Cues for sequence, posture, position from RW.  Pt with ltd WB on R LE 2* knee pain   Stairs            Wheelchair Mobility    Modified Rankin (Stroke Patients Only)       Balance                                    Cognition Arousal/Alertness: Awake/alert Behavior During Therapy: WFL for tasks assessed/performed Overall Cognitive Status: Within Functional Limits for tasks assessed                      Exercises Total Joint Exercises Ankle Circles/Pumps: AROM;Both;10 reps;Supine Quad Sets: AROM;Both;10  reps;Supine Heel Slides: AAROM;Right;15 reps;Supine Hip ABduction/ADduction: AAROM;Right;15 reps;Supine    General Comments        Pertinent Vitals/Pain Pain Assessment: 0-10 Pain Score: 3  Pain Location: R hip and knee with WB Pain Descriptors / Indicators: Aching;Sore Pain Intervention(s): Limited activity within patient's tolerance;Monitored during session;Premedicated before session;Ice applied    Home Living                      Prior Function            PT Goals (current goals can now be found in the care plan section) Acute Rehab PT Goals Patient Stated Goal: decreased knee pain; regain independence PT Goal Formulation: With patient Time For Goal Achievement: 01/18/15 Potential to Achieve Goals: Good Progress towards PT goals: Progressing toward goals    Frequency  7X/week    PT Plan Current plan remains appropriate    Co-evaluation             End of Session Equipment Utilized During Treatment: Gait belt Activity Tolerance: Patient tolerated treatment well Patient left: in bed;with call bell/phone within reach;with family/visitor present     Time: 1425-1505 PT Time Calculation (min) (ACUTE ONLY): 40 min  Charges:  $Gait Training: 8-22 mins $Therapeutic Exercise: 8-22 mins                    G Codes:  Astrid Vides 01/14/2015, 5:04 PM

## 2015-01-14 NOTE — Care Management Note (Addendum)
Case Management Note  Patient Details  Name: Maria Schneider MRN: [id number] Date of Birth: 14-May-1945  Subjective/Objective:                   RIGHT TOTAL HIP ARTHROPLASTY ANTERIOR APPROACH (Right) Action/Plan: Discharge planning  Expected Discharge Date:                  Expected Discharge Plan:  [place]  In-House Referral:     Discharge planning Services  CM Consult  Post Acute Care Choice:  Home Health Choice offered to:  Patient  DME Arranged:  N/A DME Agency:  NA  HH Arranged:  PT HH Agency:  [place]  Status of Service:  Completed, signed off  Medicare Important Message Given:    Date Medicare IM Given:    Medicare IM give by:    Date Additional Medicare IM Given:    Additional Medicare Important Message give by:     If discussed at [place] of Stay Meetings, dates discussed:    Additional Comments: Utilization Review complete. CM met with pt in room to offer choice of home health agency.  Pt chooses [facility] to render HHPT.  Address and contact information verified by pt.  Pt has DME at home.  Referral called to [employer], Tim.  No other CM needs were communicated. Dellie Catholic, RN 01/14/2015, 10:43 AM

## 2015-01-14 NOTE — Evaluation (Signed)
Physical Therapy Evaluation Patient Details Name: Maria Schneider MRN: [id number] DOB: 06-14-1945 Today's Date: 01/14/2015   History of Present Illness  s/p R DA THA  Clinical Impression  Pt s/p R THR presents with decreased R LE strength/ROM and post op hip and knee pain limiting functional mobility. Pt should progress to dc home with family assist and HHPT follow up.    Follow Up Recommendations Home health PT    Equipment Recommendations  None recommended by PT    Recommendations for Other Services OT consult     Precautions / Restrictions Precautions Precautions: Fall Restrictions Weight Bearing Restrictions: No      Mobility  Bed Mobility               General bed mobility comments: NT - OOB with OT  Transfers Overall transfer level: Needs assistance Equipment used: Rolling walker (2 wheeled) Transfers: Sit to/from Stand Sit to Stand: Min assist            Ambulation/Gait Ambulation/Gait assistance: Min assist Ambulation Distance (Feet): 111 Feet Assistive device: Rolling walker (2 wheeled) Gait Pattern/deviations: Step-to pattern;Decreased step length - right;Decreased step length - left;Decreased stance time - right;Shuffle;Antalgic;Trunk flexed Gait velocity: decr Gait velocity interpretation: Below normal speed for age/gender General Gait Details: Cues for sequence, posture, position from RW.  Pt with ltd WB on R LE 2* knee pain  Stairs            Wheelchair Mobility    Modified Rankin (Stroke Patients Only)       Balance                                             Pertinent Vitals/Pain Pain Assessment: 0-10 Pain Score: 3  Pain Location: R hip and intermittant pain R knee with WB 5/10 Pain Descriptors / Indicators: Aching;Sore;Sharp Pain Intervention(s): Limited activity within patient's tolerance;Monitored during session;Premedicated before session;Ice applied    Home Living Family/patient expects to be  discharged to:: Private residence Living Arrangements: Spouse/significant other Available Help at Discharge: Family;Available 24 hours/day Type of Home: House Home Access: Stairs to enter Entrance Stairs-Rails: None Entrance Stairs-Number of Steps: 2 Home Layout: Two level;Able to live on main level with bedroom/bathroom Home Equipment: Bedside commode;Walker - 2 wheels;Shower seat;Cane - single point Additional Comments: tubs upstairs; will stay downstairs    Prior Function Level of Independence: Independent               Hand Dominance        Extremity/Trunk Assessment   Upper Extremity Assessment: Overall WFL for tasks assessed           Lower Extremity Assessment: RLE deficits/detail RLE Deficits / Details: Strength at hip 2+/5 with AAROM at hip to 90 flex and 20 abd.  AROM at knee [facility] with strength >3/5    Cervical / Trunk Assessment: Normal  Communication   Communication: No difficulties  Cognition Arousal/Alertness: Awake/alert Behavior During Therapy: WFL for tasks assessed/performed Overall Cognitive Status: Within Functional Limits for tasks assessed                      General Comments      Exercises Total Joint Exercises Ankle Circles/Pumps: AROM;Both;10 reps;Supine Quad Sets: AROM;Both;10 reps;Supine Heel Slides: AAROM;Right;15 reps;Supine Hip ABduction/ADduction: AAROM;Right;15 reps;Supine      Assessment/Plan    PT Assessment Patient  needs continued PT services  PT Diagnosis Difficulty walking   PT Problem List Decreased strength;Decreased range of motion;Decreased activity tolerance;Decreased mobility;Decreased knowledge of use of DME;Pain  PT Treatment Interventions DME instruction;Gait training;Stair training;Functional mobility training;Therapeutic activities;Therapeutic exercise;Patient/family education   PT Goals (Current goals can be found in the Care Plan section) Acute Rehab PT Goals Patient Stated Goal: decreased knee  pain; regain independence PT Goal Formulation: With patient Time For Goal Achievement: 01/18/15 Potential to Achieve Goals: Good    Frequency 7X/week   Barriers to discharge        Co-evaluation               End of Session Equipment Utilized During Treatment: Gait belt Activity Tolerance: Patient tolerated treatment well Patient left: in chair;with call bell/phone within reach;with family/visitor present Nurse Communication: Mobility status         Time: 8264-1583 PT Time Calculation (min) (ACUTE ONLY): 34 min   Charges:   PT Evaluation $Initial PT Evaluation Tier I: 1 Procedure PT Treatments $Therapeutic Exercise: 8-22 mins   PT G Codes:        Don Giarrusso 02-10-15, 12:46 PM

## 2015-01-14 NOTE — Progress Notes (Signed)
   Subjective: 1 Day Post-Op Procedure(s) (LRB): RIGHT TOTAL HIP ARTHROPLASTY ANTERIOR APPROACH (Right) Patient reports pain as mild.   Patient seen in rounds by Dr. Wynelle Link. Patient is well, but has had some minor complaints of pain in the hip, requiring pain medications We will start therapy today. If she does really well and meets all goals, then possibly home alter this afternoon. Plan is to go Home after hospital stay.  Objective: Vital signs in last 24 hours: Temp:  [96.6 F (35.9 C)-97.9 F (36.6 C)] 97.5 F (36.4 C) (10/27 0500) Pulse Rate:  [51-69] 59 (10/27 0500) Resp:  [11-18] 16 (10/27 0500) BP: (124-159)/(55-100) 126/55 mmHg (10/27 0500) SpO2:  [97 %-100 %] 100 % (10/27 0500) Weight:  [77.565 kg (171 lb)] 77.565 kg (171 lb) (10/26 1302)  Intake/Output from previous day:  Intake/Output Summary (Last 24 hours) at 01/14/15 0826 Last data filed at 01/14/15 0740  Gross per 24 hour  Intake   1400 ml  Output   1855 ml  Net   -455 ml    Intake/Output this shift: Total I/O In: 50 [I.V.:50] Out: -   Labs:  Recent Labs  01/14/15 0532  HGB 10.1*    Recent Labs  01/14/15 0532  WBC 6.4  RBC 3.64*  HCT 31.7*  PLT 148*    Recent Labs  01/14/15 0532  NA 140  K 4.7  CL 105  CO2 26  BUN 13  CREATININE 0.90  GLUCOSE 140*  CALCIUM 8.8*   No results for input(s): LABPT, INR in the last 72 hours.  EXAM General - Patient is Alert, Appropriate and Oriented Extremity - Neurovascular intact Sensation intact distally Dorsiflexion/Plantar flexion intact Dressing - dressing C/D/I Motor Function - intact, moving foot and toes well on exam.  Hemovac pulled without difficulty.  Past Medical History  Diagnosis Date  . Hyperlipidemia   . Hypertension   . Vitamin D deficiency   . Depression   . Allergy   . Asthma   . Urinary incontinence   . GERD (gastroesophageal reflux disease)   . Detrusor instability   . Osteopenia   . Arthritis   . History of  skin cancer   . Diverticulosis     not symptomatic  . History of kidney stones   . Cancer ([place])     hx skin cancer  . History of pancreatitis     April 2015    Assessment/Plan: 1 Day Post-Op Procedure(s) (LRB): RIGHT TOTAL HIP ARTHROPLASTY ANTERIOR APPROACH (Right) Principal Problem:   OA (osteoarthritis) of hip  Estimated body mass index is 28.46 kg/(m^2) as calculated from the following:   Height as of this encounter: 5\' 5"  (1.651 m).   Weight as of this encounter: 77.565 kg (171 lb). Advance diet Up with therapy Plan for discharge tomorrow Discharge home with home health  DVT Prophylaxis - Xarelto Weight Bearing As Tolerated right Leg Hemovac Pulled Begin Therapy  If does very well: Discharge home with home health Diet - Cardiac diet Follow up - in 2 weeks Activity - WBAT Disposition - Home Condition Upon Discharge - pending at this time D/C Meds - See DC Summary DVT Prophylaxis - Xarelto  [name], PA-C Orthopaedic Surgery 01/14/2015, 8:26 AM

## 2015-01-14 NOTE — Discharge Summary (Signed)
Physician Discharge Summary   Patient ID: MARIADEL MRUK MRN: [id number] DOB/AGE: 04/09/1945 69 y.o.  Admit date: 01/13/2015 Discharge date: 01-15-2015  Primary Diagnosis:  Osteoarthritis of the Right hip.   Admission Diagnoses:  Past Medical History  Diagnosis Date  . Hyperlipidemia   . Hypertension   . Vitamin D deficiency   . Depression   . Allergy   . Asthma   . Urinary incontinence   . GERD (gastroesophageal reflux disease)   . Detrusor instability   . Osteopenia   . Arthritis   . History of skin cancer   . Diverticulosis     not symptomatic  . History of kidney stones   . Cancer ([place])     hx skin cancer  . History of pancreatitis     April 2015   Discharge Diagnoses:   Principal Problem:   OA (osteoarthritis) of hip  Estimated body mass index is 28.46 kg/(m^2) as calculated from the following:   Height as of this encounter: _0  (1.651 m).   Weight as of this encounter: 77.565 kg (171 lb).  Procedure(s) (LRB): RIGHT TOTAL HIP ARTHROPLASTY ANTERIOR APPROACH (Right)   Consults: None  HPI: Maria Schneider is a 69 y.o. female who has advanced end-  stage arthritis of her Right hip with progressively worsening pain and  dysfunction.The patient has failed nonoperative management and presents for  total hip arthroplasty.   Laboratory Data: Admission on 01/13/2015  Component Date Value Ref Range Status  . WBC 01/14/2015 6.4  4.0 - 10.5 K/uL Final  . RBC 01/14/2015 3.64* 3.87 - 5.11 MIL/uL Final  . Hemoglobin 01/14/2015 10.1* 12.0 - 15.0 g/dL Final  . HCT 01/14/2015 31.7* 36.0 - 46.0 % Final  . MCV 01/14/2015 87.1  78.0 - 100.0 fL Final  . MCH 01/14/2015 27.7  26.0 - 34.0 pg Final  . MCHC 01/14/2015 31.9  30.0 - 36.0 g/dL Final  . RDW 01/14/2015 13.2  11.5 - 15.5 % Final  . Platelets 01/14/2015 148* 150 - 400 K/uL Final  . Sodium 01/14/2015 140  135 - 145 mmol/L Final  . Potassium 01/14/2015 4.7  3.5 - 5.1 mmol/L Final  . Chloride 01/14/2015 105   101 - 111 mmol/L Final  . CO2 01/14/2015 26  22 - 32 mmol/L Final  . Glucose, Bld 01/14/2015 140* 65 - 99 mg/dL Final  . BUN 01/14/2015 13  6 - 20 mg/dL Final  . Creatinine, Ser 01/14/2015 0.90  0.44 - 1.00 mg/dL Final  . Calcium 01/14/2015 8.8* 8.9 - 10.3 mg/dL Final  . GFR calc non Af Amer 01/14/2015 >60  >60 mL/min Final  . GFR calc Af Amer 01/14/2015 >60  >60 mL/min Final   Comment: (NOTE) The eGFR has been calculated using the CKD EPI equation. This calculation has not been validated in all clinical situations. eGFR's persistently <60 mL/min signify possible Chronic Kidney Disease.   Georgiann Hahn gap 01/14/2015 9  5 - 15 Final  Hospital Outpatient Visit on 01/06/2015  Component Date Value Ref Range Status  . aPTT 01/06/2015 30  24 - 37 seconds Final  . Prothrombin Time 01/06/2015 14.2  11.6 - 15.2 seconds Final  . INR 01/06/2015 1.08  0.00 - 1.49 Final  . ABO/RH(D) 01/06/2015 O POS   Final  . Antibody Screen 01/06/2015 NEG   Final  . Sample Expiration 01/06/2015 01/16/2015   Final  . Color, Urine 01/06/2015 YELLOW  YELLOW Final  . APPearance 01/06/2015 CLEAR  CLEAR Final  .  Specific Gravity, Urine 01/06/2015 1.010  1.005 - 1.030 Final  . pH 01/06/2015 6.5  5.0 - 8.0 Final  . Glucose, UA 01/06/2015 NEGATIVE  NEGATIVE mg/dL Final  . Hgb urine dipstick 01/06/2015 NEGATIVE  NEGATIVE Final  . Bilirubin Urine 01/06/2015 NEGATIVE  NEGATIVE Final  . Ketones, ur 01/06/2015 NEGATIVE  NEGATIVE mg/dL Final  . Protein, ur 01/06/2015 NEGATIVE  NEGATIVE mg/dL Final  . Urobilinogen, UA 01/06/2015 1.0  0.0 - 1.0 mg/dL Final  . Nitrite 01/06/2015 NEGATIVE  NEGATIVE Final  . Leukocytes, UA 01/06/2015 NEGATIVE  NEGATIVE Final   MICROSCOPIC NOT DONE ON URINES WITH NEGATIVE PROTEIN, BLOOD, LEUKOCYTES, NITRITE, OR GLUCOSE <1000 mg/dL.  [name] MRSA, PCR 01/06/2015 NEGATIVE  NEGATIVE Final  . Staphylococcus aureus 01/06/2015 NEGATIVE  NEGATIVE Final   Comment:        The Xpert SA Assay (FDA approved for  NASAL specimens in patients over 34 years of age), is one component of a comprehensive surveillance program.  Test performance has been validated by [facility] for patients greater than or equal to 68 year old. It is not intended to diagnose infection nor to guide or monitor treatment.   . ABO/RH(D) 01/06/2015 O POS   Final  Office Visit on 12/22/2014  Component Date Value Ref Range Status  . Sodium 12/22/2014 143  135 - 145 mEq/L Final  . Potassium 12/22/2014 4.4  3.5 - 5.1 mEq/L Final  . Chloride 12/22/2014 104  96 - 112 mEq/L Final  . CO2 12/22/2014 31  19 - 32 mEq/L Final  . Glucose, Bld 12/22/2014 69* 70 - 99 mg/dL Final  . BUN 12/22/2014 23  6 - 23 mg/dL Final  . Creatinine, Ser 12/22/2014 1.18  0.40 - 1.20 mg/dL Final  . Calcium 12/22/2014 9.5  8.4 - 10.5 mg/dL Final  . GFR 12/22/2014 48.54* >60.00 mL/min Final  . Total Bilirubin 12/22/2014 0.3  0.2 - 1.2 mg/dL Final  . Bilirubin, Direct 12/22/2014 0.1  0.0 - 0.3 mg/dL Final  . Alkaline Phosphatase 12/22/2014 54  39 - 117 U/L Final  . AST 12/22/2014 17  0 - 37 U/L Final  . ALT 12/22/2014 13  0 - 35 U/L Final  . Total Protein 12/22/2014 6.4  6.0 - 8.3 g/dL Final  . Albumin 12/22/2014 4.3  3.5 - 5.2 g/dL Final  . Color, UA 12/22/2014 yellow   Final  . Clarity, UA 12/22/2014 clear   Final  . Glucose, UA 12/22/2014 n   Final  . Bilirubin, UA 12/22/2014 n   Final  . Ketones, UA 12/22/2014 n   Final  . Spec Grav, UA 12/22/2014 1.025   Final  . Blood, UA 12/22/2014 n   Final  . pH, UA 12/22/2014 6.5   Final  . Protein, UA 12/22/2014 n   Final  . Urobilinogen, UA 12/22/2014 2.0   Final  . Nitrite, UA 12/22/2014 n   Final  . Leukocytes, UA 12/22/2014 Negative  Negative Final  . TSH 12/22/2014 1.88  0.35 - 4.50 uIU/mL Final  . WBC 12/22/2014 4.4  4.0 - 10.5 K/uL Final  . RBC 12/22/2014 4.10  3.87 - 5.11 Mil/uL Final  . Hemoglobin 12/22/2014 11.6* 12.0 - 15.0 g/dL Final  . HCT 12/22/2014 34.8* 36.0 - 46.0 % Final  . MCV  12/22/2014 84.8  78.0 - 100.0 fl Final  . MCHC 12/22/2014 33.3  30.0 - 36.0 g/dL Final  . RDW 12/22/2014 13.3  11.5 - 15.5 % Final  . Platelets 12/22/2014  184.0  150.0 - 400.0 K/uL Final  . Neutrophils Relative % 12/22/2014 67.4  43.0 - 77.0 % Final  . Lymphocytes Relative 12/22/2014 20.4  12.0 - 46.0 % Final  . Monocytes Relative 12/22/2014 9.5  3.0 - 12.0 % Final  . Eosinophils Relative 12/22/2014 2.3  0.0 - 5.0 % Final  . Basophils Relative 12/22/2014 0.4  0.0 - 3.0 % Final  . Neutro Abs 12/22/2014 3.0  1.4 - 7.7 K/uL Final  . Lymphs Abs 12/22/2014 0.9  0.7 - 4.0 K/uL Final  . Monocytes Absolute 12/22/2014 0.4  0.1 - 1.0 K/uL Final  . Eosinophils Absolute 12/22/2014 0.1  0.0 - 0.7 K/uL Final  . Basophils Absolute 12/22/2014 0.0  0.0 - 0.1 K/uL Final     X-Rays:Dg Pelvis Portable  01/13/2015  CLINICAL DATA:  Status post right hip replacement EXAM: PORTABLE PELVIS 1-2 VIEWS COMPARISON:  None. FINDINGS: Right hip prosthesis is seen. A surgical drain is noted. No soft tissue abnormality is noted. IMPRESSION: Status post right hip replacement.  No acute abnormality is noted. Electronically Signed   By: Inez Catalina M.D.   On: 01/13/2015 16:52   Dg C-arm 1-60 Min-no Report  01/13/2015  CLINICAL DATA: surgery C-ARM 1-60 MINUTES Fluoroscopy was utilized by the requesting physician.  No radiographic interpretation.    EKG: Orders placed or performed in visit on 12/22/14  . EKG 12-Lead     Hospital Course: Patient was admitted to [facility] and taken to the OR and underwent the above state procedure without complications.  Patient tolerated the procedure well and was later transferred to the recovery room and then to the orthopaedic floor for postoperative care.  They were given PO and IV analgesics for pain control following their surgery.  They were given 24 hours of postoperative antibiotics of  Anti-infectives    Start     Dose/Rate Route Frequency Ordered Stop   01/13/15 2100   ceFAZolin (ANCEF) IVPB 2 g/50 mL premix     2 g 100 mL/hr over 30 Minutes Intravenous Every 6 hours 01/13/15 1841 01/14/15 0335   01/13/15 1245  ceFAZolin (ANCEF) IVPB 2 g/50 mL premix     2 g 100 mL/hr over 30 Minutes Intravenous On call to O.R. 01/13/15 1234 01/13/15 1455     and started on DVT prophylaxis in the form of Xarelto.   PT and OT were ordered for total hip protocol.  The patient was allowed to be WBAT with therapy. Discharge planning was consulted to help with postop disposition and equipment needs.  Patient had a decent night on the evening of surgery.  They started to get up OOB with therapy on day one.  Hemovac drain was pulled without difficulty.  Continued to work with therapy into day two.  Dressing was changed on day two and the incision was healing well.  Patient was seen in rounds and was ready to go home.  Discharge home with home health Diet - Cardiac diet Follow up - in 2 weeks Activity - WBAT Disposition - Home Condition Upon Discharge - Good D/C Meds - See DC Summary DVT Prophylaxis - Xarelto  Discharge Instructions    Call MD / Call 911    Complete by:  As directed   If you experience chest pain or shortness of breath, CALL 911 and be transported to the hospital emergency room.  If you develope a fever above 101 F, pus (white drainage) or increased drainage or redness at the wound,  or calf pain, call your surgeon's office.     Change dressing    Complete by:  As directed   You may change your dressing dressing daily with sterile 4 x 4 inch gauze dressing and paper tape.  Do not submerge the incision under water.     Constipation Prevention    Complete by:  As directed   Drink plenty of fluids.  Prune juice may be helpful.  You may use a stool softener, such as Colace (over the counter) 100 mg twice a day.  Use MiraLax (over the counter) for constipation as needed.     Diet general    Complete by:  As directed      Discharge instructions    Complete by:  As  directed   Pick up stool softner and laxative for home use following surgery while on pain medications. Do not submerge incision under water. May remove the surgical dressing tomorrow, 01/15/2015, and then apply a dry gauze dressing daily. Please use good hand washing techniques while changing dressing each day. May shower starting three days after surgery starting Saturday 01/16/2015 Please use a clean towel to pat the incision dry following showers. Continue to use ice for pain and swelling after surgery. Do not use any lotions or creams on the incision until instructed by your surgeon.  Postoperative Constipation Protocol  Constipation - defined medically as fewer than three stools per week and severe constipation as less than one stool per week.  One of the most common issues patients have following surgery is constipation. Even if you have a regular bowel pattern at home, your normal regimen is likely to be disrupted due to multiple reasons following surgery. Combination of anesthesia, postoperative narcotics, change in appetite and fluid intake all can affect your bowels. In order to avoid complications following surgery, here are some recommendations in order to help you during your recovery period.  Colace (docusate) - Pick up an over-the-counter form of Colace or another stool softener and take twice a day as long as you are requiring postoperative pain medications. Take with a full glass of water daily. If you experience loose stools or diarrhea, hold the colace until you stool forms back up. If your symptoms do not get better within 1 week or if they get worse, check with your doctor.  Dulcolax (bisacodyl) - Pick up over-the-counter and take as directed by the product packaging as needed to assist with the movement of your bowels. Take with a full glass of water. Use this product as needed if not relieved by Colace only.   MiraLax (polyethylene glycol) - Pick up  over-the-counter to have on hand. MiraLax is a solution that will increase the amount of water in your bowels to assist with bowel movements. Take as directed and can mix with a glass of water, juice, soda, coffee, or tea. Take if you go more than two days without a movement. Do not use MiraLax more than once per day. Call your doctor if you are still constipated or irregular after using this medication for 7 days in a row.  If you continue to have problems with postoperative constipation, please contact the office for further assistance and recommendations. If you experience "the worst abdominal pain ever" or develop nausea or vomiting, please contact the office immediatly for further recommendations for treatment.  Total Hip Protocol.  Take Xarelto for two and a half more weeks, then discontinue Xarelto. Once the patient has completed the blood thinner regimen,  then take a Baby 81 mg Aspirin daily for three more weeks.     Do not sit on low chairs, stoools or toilet seats, as it may be difficult to get up from low surfaces    Complete by:  As directed      Driving restrictions    Complete by:  As directed   No driving until released by the physician.     Increase activity slowly as tolerated    Complete by:  As directed      Lifting restrictions    Complete by:  As directed   No lifting until released by the physician.     Patient may shower    Complete by:  As directed   You may shower without a dressing once there is no drainage.  Do not wash over the wound.  If drainage remains, do not shower until drainage stops.     TED hose    Complete by:  As directed   Use stockings (TED hose) for 3 weeks on both leg(s).  You may remove them at night for sleeping.     Weight bearing as tolerated    Complete by:  As directed   Laterality:  right  Extremity:  Lower            Medication List    STOP taking these medications        cholecalciferol 1000 UNITS tablet  Commonly known  as:  VITAMIN D     naproxen 250 MG tablet  Commonly known as:  NAPROSYN     naproxen sodium 220 MG tablet  Commonly known as:  ANAPROX     oxyCODONE-acetaminophen 5-325 MG tablet  Commonly known as:  PERCOCET      TAKE these medications        buPROPion 300 MG 24 hr tablet  Commonly known as:  WELLBUTRIN XL  Take 300 mg by mouth daily.     esomeprazole 20 MG capsule  Commonly known as:  NEXIUM  Take 20 mg by mouth daily.     ezetimibe 10 MG tablet  Commonly known as:  ZETIA  Take 1 tablet (10 mg total) by mouth daily.     fenofibrate 160 MG tablet  TAKE 1 TABLET BY MOUTH EVERY DAY     fenofibrate 160 MG tablet  Take 1 tablet (160 mg total) by mouth daily.     HYDROmorphone 2 MG tablet  Commonly known as:  DILAUDID  Take 1-2 tablets (2-4 mg total) by mouth every 4 (four) hours as needed for moderate pain or severe pain.     methocarbamol 500 MG tablet  Commonly known as:  ROBAXIN  Take 1 tablet (500 mg total) by mouth every 6 (six) hours as needed for muscle spasms.     metoprolol succinate 25 MG 24 hr tablet  Commonly known as:  TOPROL-XL  Take 1 tablet (25 mg total) by mouth daily.     ondansetron 4 MG disintegrating tablet  Commonly known as:  ZOFRAN ODT  Take 1 tablet (4 mg total) by mouth every 8 (eight) hours as needed for nausea or vomiting.     rivaroxaban 10 MG Tabs tablet  Commonly known as:  XARELTO  Take 1 tablet (10 mg total) by mouth daily with breakfast. Take Xarelto for two and a half more weeks, then discontinue Xarelto. Once the patient has completed the blood thinner regimen, then take a Baby 81 mg Aspirin daily for three more weeks.  sertraline 100 MG tablet  Commonly known as:  ZOLOFT  Take 200 mg by mouth daily.     tamsulosin 0.4 MG Caps capsule  Commonly known as:  FLOMAX  Take 1 capsule daily until stone passes.           Follow-up Information    Follow up with Gearlean Alf, MD. Schedule an appointment as soon as possible  for a visit on 01/26/2015.   Specialty:  Orthopedic Surgery   Why:  Call office at [phone] to setup appointment on Tuesday 01/26/2015 with Dr. Wynelle Link.   Contact information:   [address] [place] [address] [phone]       Signed: [name], PA-C Orthopaedic Surgery 01/14/2015, 8:36 AM

## 2015-01-15 LAB — BASIC METABOLIC PANEL
Anion gap: 6 (ref 5–15)
BUN: 18 mg/dL (ref 6–20)
CALCIUM: 8.9 mg/dL (ref 8.9–10.3)
CO2: 31 mmol/L (ref 22–32)
CREATININE: 0.9 mg/dL (ref 0.44–1.00)
Chloride: 105 mmol/L (ref 101–111)
GFR calc Af Amer: >60 mL/min (ref >60)
GFR calc non Af Amer: >60 mL/min (ref >60)
GLUCOSE: 114 mg/dL — AB (ref 65–99)
Potassium: 3.7 mmol/L (ref 3.5–5.1)
Sodium: 142 mmol/L (ref 135–145)

## 2015-01-15 LAB — CBC
HCT: 30.2 % — ABNORMAL LOW (ref 36.0–46.0)
Hemoglobin: 9.8 g/dL — ABNORMAL LOW (ref 12.0–15.0)
MCH: 28.9 pg (ref 26.0–34.0)
MCHC: 32.5 g/dL (ref 30.0–36.0)
MCV: 89.1 fL (ref 78.0–100.0)
PLATELETS: 152 10*3/uL (ref 150–400)
RBC: 3.39 MIL/uL — ABNORMAL LOW (ref 3.87–5.11)
RDW: 13.5 % (ref 11.5–15.5)
WBC: 6.1 10*3/uL (ref 4.0–10.5)

## 2015-01-15 NOTE — Progress Notes (Signed)
OT Cancellation Note  Patient Details Name: Maria Schneider MRN: [id number] DOB: 04-Oct-1945   Cancelled Treatment:    Reason Eval/Treat Not Completed: Other (comment).  Checked on pt several times. She felt groggy and now tired.  Husband has assisted her to bathroom and feels comfortable assisting with adls/guarding.  Will sign off.  Alfredo Spong 01/15/2015, 2:16 PM  Lesle Chris, OTR/L [phone] 01/15/2015

## 2015-01-15 NOTE — Progress Notes (Signed)
Physical Therapy Treatment Patient Details Name: Maria Schneider MRN: [id number] DOB: [birth year] Today's Date: 01/15/2015    History of Present Illness s/p R DA THA    PT Comments    Pt progressing well with mobility.  Reviewed therex, stairs and car transfers with pt and spouse.    Follow Up Recommendations  Home health PT     Equipment Recommendations  None recommended by PT    Recommendations for Other Services OT consult     Precautions / Restrictions Precautions Precautions: Fall Restrictions Weight Bearing Restrictions: No RLE Weight Bearing: Weight bearing as tolerated    Mobility  Bed Mobility Overal bed mobility: Needs Assistance       Supine to sit: Supervision     General bed mobility comments: Min cues for sequence and use of L LE to self assist  Transfers Overall transfer level: Needs assistance Equipment used: Rolling walker (2 wheeled) Transfers: Sit to/from Stand Sit to Stand: Supervision         General transfer comment: cues for safety; cues for UE/LE placement  Ambulation/Gait Ambulation/Gait assistance: Min guard;Supervision Ambulation Distance (Feet): 111 Feet Assistive device: Rolling walker (2 wheeled) Gait Pattern/deviations: Step-to pattern;Step-through pattern;Decreased step length - right;Decreased step length - left;Shuffle;Trunk flexed Gait velocity: decr   General Gait Details: Cues for sequence, posture, position from RW.  Pt with ltd WB on R LE 2* knee pain   Stairs Stairs: Yes Stairs assistance: Min assist Stair Management: No rails;Step to pattern;Forwards;With walker Number of Stairs: 4 General stair comments: 2 steps twice.  Walker set at top step with cues for sequence and min assist for support.  Wheelchair Mobility    Modified Rankin (Stroke Patients Only)       Balance                                    Cognition Arousal/Alertness: Awake/alert Behavior During Therapy: WFL for tasks  assessed/performed Overall Cognitive Status: Within Functional Limits for tasks assessed                      Exercises Total Joint Exercises Ankle Circles/Pumps: AROM;Both;10 reps;Supine Quad Sets: AROM;Both;10 reps;Supine Heel Slides: AAROM;Right;15 reps;Supine Hip ABduction/ADduction: AAROM;Right;15 reps;Supine    General Comments        Pertinent Vitals/Pain Pain Assessment: 0-10 Pain Score: 4  Pain Location: R hip and thigh Pain Descriptors / Indicators: Aching;Sore Pain Intervention(s): Limited activity within patient's tolerance;Monitored during session;Premedicated before session;Ice applied    Home Living                      Prior Function            PT Goals (current goals can now be found in the care plan section) Acute Rehab PT Goals Patient Stated Goal: decreased knee pain; regain independence PT Goal Formulation: With patient Time For Goal Achievement: 01/18/15 Potential to Achieve Goals: Good Progress towards PT goals: Progressing toward goals    Frequency  7X/week    PT Plan Current plan remains appropriate    Co-evaluation             End of Session Equipment Utilized During Treatment: Gait belt Activity Tolerance: Patient tolerated treatment well Patient left: in chair;with call bell/phone within reach;with family/visitor present     Time: 7741-2878 PT Time Calculation (min) (ACUTE ONLY): 39 min  Charges:  $Gait  Training: 8-22 mins $Therapeutic Exercise: 8-22 mins $Therapeutic Activity: 8-22 mins                    G Codes:      Ginia Rudell January 28, 2015, 2:25 PM

## 2015-01-15 NOTE — Care Management Important Message (Signed)
Important Message  Patient Details  Name: RUKAYA KLEINSCHMIDT MRN: [id number] Date of Birth: 12-06-1945   Medicare Important Message Given:  Yes-second notification given    Shelda Altes 01/15/2015, 3:50 [place] Message  Patient Details  Name: ESTEFANY GOEBEL MRN: [id number] Date of Birth: 14-May-1945   Medicare Important Message Given:  Yes-second notification given    Shelda Altes 01/15/2015, 3:50 PM

## 2015-01-15 NOTE — Plan of Care (Signed)
Problem: Phase III Progression Outcomes Goal: Anticoagulant follow-up in place Outcome: Not Applicable Date Met:  57/32/25 Xarelto VTE, no f/u needed

## 2015-01-15 NOTE — Progress Notes (Signed)
Discussed with patient and spouse discharge instructions, both verbalized agreement and understanding.  Patient to go down in wheelchair with all belongings to go home in private vehicle. Philis Fendt, RN-BSN, 01/15/2015, [phone]

## 2015-01-15 NOTE — Progress Notes (Signed)
   Subjective: 2 Days Post-Op Procedure(s) (LRB): RIGHT TOTAL HIP ARTHROPLASTY ANTERIOR APPROACH (Right) Patient reports pain as mild.   Patient seen in rounds with Dr. Wynelle Link. Patient is well, but has had some minor complaints of pain in the hip, requiring pain medications Patient is ready to go home today following therapy  Objective: Vital signs in last 24 hours: Temp:  [98 F (36.7 C)-98.5 F (36.9 C)] 98.5 F (36.9 C) (10/28 0647) Pulse Rate:  [61-81] 61 (10/28 0647) Resp:  [15-16] 16 (10/28 0647) BP: (96-189)/(40-78) 96/40 mmHg (10/28 0647) SpO2:  [93 %-99 %] 93 % (10/28 0647)  Intake/Output from previous day:  Intake/Output Summary (Last 24 hours) at 01/15/15 0900 Last data filed at 01/15/15 0626  Gross per 24 hour  Intake   1440 ml  Output    700 ml  Net    740 ml    Labs:  Recent Labs  01/14/15 0532 01/15/15 0552  HGB 10.1* 9.8*    Recent Labs  01/14/15 0532 01/15/15 0552  WBC 6.4 6.1  RBC 3.64* 3.39*  HCT 31.7* 30.2*  PLT 148* 152    Recent Labs  01/14/15 0532 01/15/15 0552  NA 140 142  K 4.7 3.7  CL 105 105  CO2 26 31  BUN 13 18  CREATININE 0.90 0.90  GLUCOSE 140* 114*  CALCIUM 8.8* 8.9   No results for input(s): LABPT, INR in the last 72 hours.  EXAM: General - Patient is Alert, Appropriate and Oriented Extremity - Neurovascular intact Sensation intact distally Dorsiflexion/Plantar flexion intact Incision - clean, dry, no drainage Motor Function - intact, moving foot and toes well on exam.   Assessment/Plan: 2 Days Post-Op Procedure(s) (LRB): RIGHT TOTAL HIP ARTHROPLASTY ANTERIOR APPROACH (Right) Procedure(s) (LRB): RIGHT TOTAL HIP ARTHROPLASTY ANTERIOR APPROACH (Right) Past Medical History  Diagnosis Date  . Hyperlipidemia   . Hypertension   . Vitamin D deficiency   . Depression   . Allergy   . Asthma   . Urinary incontinence   . GERD (gastroesophageal reflux disease)   . Detrusor instability   . Osteopenia   .  Arthritis   . History of skin cancer   . Diverticulosis     not symptomatic  . History of kidney stones   . Cancer ([place])     hx skin cancer  . History of pancreatitis     April 2015   Principal Problem:   OA (osteoarthritis) of hip  Estimated body mass index is 28.46 kg/(m^2) as calculated from the following:   Height as of this encounter: 5\' 5"  (1.651 m).   Weight as of this encounter: 77.565 kg (171 lb). Up with therapy Discharge home with home health Diet - Cardiac diet Follow up - in 2 weeks Activity - WBAT Disposition - Home Condition Upon Discharge - Good D/C Meds - See DC Summary DVT Prophylaxis - Xarelto  [name], PA-C Orthopaedic Surgery 01/15/2015, 9:00 AM

## 2015-02-16 ENCOUNTER — Encounter: Payer: Self-pay | Admitting: Family Medicine

## 2015-02-16 ENCOUNTER — Ambulatory Visit (INDEPENDENT_AMBULATORY_CARE_PROVIDER_SITE_OTHER): Payer: Medicare Other | Admitting: Family Medicine

## 2015-02-16 VITALS — BP 123/76 | HR 88 | Temp 98.8°F | Ht 65.0 in | Wt 168.0 lb

## 2015-02-16 DIAGNOSIS — N390 Urinary tract infection, site not specified: Secondary | ICD-10-CM | POA: Diagnosis not present

## 2015-02-16 LAB — POCT URINALYSIS DIPSTICK
BILIRUBIN UA: NEGATIVE
GLUCOSE UA: NEGATIVE
Ketones, UA: NEGATIVE
UROBILINOGEN UA: 1
pH, UA: 6

## 2015-02-16 MED ORDER — CIPROFLOXACIN HCL 500 MG PO TABS: 500.0000 mg | ORAL_TABLET | Freq: Two times a day (BID) | ORAL | Status: DC

## 2015-02-16 NOTE — Progress Notes (Signed)
Pre visit review using our clinic review tool, if applicable. No additional management support is needed unless otherwise documented below in the visit note. 

## 2015-02-16 NOTE — Progress Notes (Signed)
   Subjective:    Patient ID: Maria Schneider, female    DOB: 1945/05/24, 69 y.o.   MRN: [id number]  HPI Here for 2 days of burning on urination and urgency. No fever.    Review of Systems  Constitutional: Negative.   Genitourinary: Positive for dysuria, urgency and frequency. Negative for flank pain.       Objective:   Physical Exam  Constitutional: She appears well-developed and well-nourished.  Abdominal: Soft. Bowel sounds are normal. She exhibits no distension and no mass. There is no tenderness. There is no rebound and no guarding.          Assessment & Plan:  UTI, treat with Cipro. Culture is pending

## 2015-02-16 NOTE — Addendum Note (Signed)
Addended by: Aggie Hacker A on: 02/16/2015 02:20 PM   Modules accepted: Orders

## 2015-02-18 ENCOUNTER — Ambulatory Visit: Payer: Self-pay | Admitting: Family Medicine

## 2015-02-19 ENCOUNTER — Encounter: Payer: Self-pay | Admitting: Family Medicine

## 2015-02-19 LAB — URINE CULTURE: Colony Count: >=100000

## 2015-02-22 ENCOUNTER — Encounter: Payer: Self-pay | Admitting: Family Medicine

## 2015-02-22 MED ORDER — NITROFURANTOIN MONOHYD MACRO 100 MG PO CAPS: 100.0000 mg | ORAL_CAPSULE | Freq: Two times a day (BID) | ORAL | Status: DC

## 2015-02-22 NOTE — Addendum Note (Signed)
Addended by: Aggie Hacker A on: 02/22/2015 10:22 AM   Modules accepted: Orders

## 2015-02-23 ENCOUNTER — Encounter: Payer: Self-pay | Admitting: Family Medicine

## 2015-02-23 MED ORDER — ONDANSETRON HCL 8 MG PO TABS: 8.0000 mg | ORAL_TABLET | Freq: Three times a day (TID) | ORAL | Status: DC | PRN

## 2015-02-23 NOTE — Telephone Encounter (Signed)
Per Dr. Sarajane Jews call in Zofran 8 mg take 1 po every 8 hrs as needed, also take tylenol for fever, try drinking fluids. I spoke with pt and she will try this and see how it goes. She will call back in any worse. ( recently diagnosed with a UTI )  I did send script e-scribe.

## 2015-02-26 ENCOUNTER — Encounter: Payer: Self-pay | Admitting: Family Medicine

## 2015-02-26 ENCOUNTER — Encounter (HOSPITAL_COMMUNITY): Payer: Self-pay | Admitting: *Deleted

## 2015-02-26 ENCOUNTER — Emergency Department (HOSPITAL_COMMUNITY): Payer: Medicare Other

## 2015-02-26 ENCOUNTER — Telehealth: Payer: Self-pay | Admitting: Family Medicine

## 2015-02-26 ENCOUNTER — Emergency Department (HOSPITAL_COMMUNITY)
Admission: EM | Admit: 2015-02-26 | Discharge: 2015-02-26 | Disposition: A | Discharge status: Home / Self Care | Payer: Medicare Other | Attending: Emergency Medicine | Admitting: Emergency Medicine

## 2015-02-26 DIAGNOSIS — R000 Tachycardia, unspecified: Secondary | ICD-10-CM | POA: Diagnosis not present

## 2015-02-26 DIAGNOSIS — M1990 Unspecified osteoarthritis, unspecified site: Secondary | ICD-10-CM | POA: Diagnosis not present

## 2015-02-26 DIAGNOSIS — I10 Essential (primary) hypertension: Secondary | ICD-10-CM | POA: Insufficient documentation

## 2015-02-26 DIAGNOSIS — E785 Hyperlipidemia, unspecified: Secondary | ICD-10-CM | POA: Insufficient documentation

## 2015-02-26 DIAGNOSIS — N390 Urinary tract infection, site not specified: Secondary | ICD-10-CM | POA: Insufficient documentation

## 2015-02-26 DIAGNOSIS — R945 Abnormal results of liver function studies: Secondary | ICD-10-CM

## 2015-02-26 DIAGNOSIS — F329 Major depressive disorder, single episode, unspecified: Secondary | ICD-10-CM | POA: Insufficient documentation

## 2015-02-26 DIAGNOSIS — R509 Fever, unspecified: Secondary | ICD-10-CM | POA: Diagnosis present

## 2015-02-26 DIAGNOSIS — Z87442 Personal history of urinary calculi: Secondary | ICD-10-CM | POA: Diagnosis not present

## 2015-02-26 DIAGNOSIS — J45909 Unspecified asthma, uncomplicated: Secondary | ICD-10-CM | POA: Insufficient documentation

## 2015-02-26 DIAGNOSIS — K219 Gastro-esophageal reflux disease without esophagitis: Secondary | ICD-10-CM | POA: Insufficient documentation

## 2015-02-26 DIAGNOSIS — Z85828 Personal history of other malignant neoplasm of skin: Secondary | ICD-10-CM | POA: Insufficient documentation

## 2015-02-26 DIAGNOSIS — Z79899 Other long term (current) drug therapy: Secondary | ICD-10-CM | POA: Insufficient documentation

## 2015-02-26 DIAGNOSIS — E559 Vitamin D deficiency, unspecified: Secondary | ICD-10-CM | POA: Insufficient documentation

## 2015-02-26 DIAGNOSIS — R7989 Other specified abnormal findings of blood chemistry: Secondary | ICD-10-CM

## 2015-02-26 LAB — URINALYSIS, ROUTINE W REFLEX MICROSCOPIC
GLUCOSE, UA: NEGATIVE mg/dL
KETONES UR: NEGATIVE mg/dL
Nitrite: NEGATIVE
PROTEIN: NEGATIVE mg/dL
Specific Gravity, Urine: 1.017 (ref 1.005–1.030)
pH: 6 (ref 5.0–8.0)

## 2015-02-26 LAB — CBC WITH DIFFERENTIAL/PLATELET
BASOS ABS: 0 10*3/uL (ref 0.0–0.1)
BASOS PCT: 0 %
EOS PCT: 3 %
Eosinophils Absolute: 0.2 10*3/uL (ref 0.0–0.7)
HCT: 39.2 % (ref 36.0–46.0)
Hemoglobin: 12.5 g/dL (ref 12.0–15.0)
LYMPHS PCT: 7 %
Lymphs Abs: 0.4 10*3/uL — ABNORMAL LOW (ref 0.7–4.0)
MCH: 27.8 pg (ref 26.0–34.0)
MCHC: 31.9 g/dL (ref 30.0–36.0)
MCV: 87.1 fL (ref 78.0–100.0)
MONO ABS: 0.3 10*3/uL (ref 0.1–1.0)
MONOS PCT: 5 %
Neutro Abs: 4.7 10*3/uL (ref 1.7–7.7)
Neutrophils Relative %: 85 %
PLATELETS: 234 10*3/uL (ref 150–400)
RBC: 4.5 MIL/uL (ref 3.87–5.11)
RDW: 13.4 % (ref 11.5–15.5)
WBC: 5.5 10*3/uL (ref 4.0–10.5)

## 2015-02-26 LAB — COMPREHENSIVE METABOLIC PANEL
ALK PHOS: 191 U/L — AB (ref 38–126)
ALT: 365 U/L — ABNORMAL HIGH (ref 14–54)
AST: 484 U/L — ABNORMAL HIGH (ref 15–41)
Albumin: 4.2 g/dL (ref 3.5–5.0)
Anion gap: 10 (ref 5–15)
BILIRUBIN TOTAL: 1.3 mg/dL — AB (ref 0.3–1.2)
BUN: 19 mg/dL (ref 6–20)
CALCIUM: 10.1 mg/dL (ref 8.9–10.3)
CO2: 28 mmol/L (ref 22–32)
Chloride: 105 mmol/L (ref 101–111)
Creatinine, Ser: 1.11 mg/dL — ABNORMAL HIGH (ref 0.44–1.00)
GFR calc Af Amer: 58 mL/min — ABNORMAL LOW (ref >60)
GFR calc non Af Amer: 50 mL/min — ABNORMAL LOW (ref >60)
GLUCOSE: 114 mg/dL — AB (ref 65–99)
Potassium: 3.9 mmol/L (ref 3.5–5.1)
Sodium: 143 mmol/L (ref 135–145)
TOTAL PROTEIN: 7.4 g/dL (ref 6.5–8.1)

## 2015-02-26 LAB — URINE MICROSCOPIC-ADD ON

## 2015-02-26 LAB — I-STAT CG4 LACTIC ACID, ED
Lactic Acid, Venous: 2.97 mmol/L (ref 0.5–2.0)
Lactic Acid, Venous: 0.75 mmol/L (ref 0.5–2.0)

## 2015-02-26 LAB — ACETAMINOPHEN LEVEL

## 2015-02-26 MED ORDER — SODIUM CHLORIDE 0.9 % IV BOLUS (SEPSIS)
1000.0000 mL | Freq: Once | INTRAVENOUS | Status: AC
  Administered 2015-02-26: 1000 mL via INTRAVENOUS

## 2015-02-26 MED ORDER — ONDANSETRON HCL 8 MG PO TABS: 8.0000 mg | ORAL_TABLET | Freq: Three times a day (TID) | ORAL | Status: DC | PRN

## 2015-02-26 MED ORDER — CEPHALEXIN 500 MG PO CAPS: 500.0000 mg | ORAL_CAPSULE | Freq: Four times a day (QID) | ORAL | Status: DC

## 2015-02-26 MED ORDER — DEXTROSE 5 % IV SOLN
1.0000 g | Freq: Once | INTRAVENOUS | Status: AC
  Administered 2015-02-26: 1 g via INTRAVENOUS
  Filled 2015-02-26: qty 10

## 2015-02-26 MED ORDER — SODIUM CHLORIDE 0.9 % IV BOLUS (SEPSIS)
2000.0000 mL | Freq: Once | INTRAVENOUS | Status: AC
  Administered 2015-02-26: 2000 mL via INTRAVENOUS

## 2015-02-26 NOTE — ED Notes (Signed)
Pt denies nausea and pain at present time.

## 2015-02-26 NOTE — ED Notes (Signed)
Ultrasound at bedside

## 2015-02-26 NOTE — ED Notes (Signed)
Unable to get vitals. Ultrasound is currently in the room.

## 2015-02-26 NOTE — ED Notes (Signed)
md at bedside  Pt alert and oriented x4. Respirations even and unlabored, bilateral symmetrical rise and fall of chest. Skin warm and dry. In no acute distress. Denies needs.   

## 2015-02-26 NOTE — ED Notes (Signed)
Pt ambulated to bathroom 

## 2015-02-26 NOTE — Telephone Encounter (Signed)
Pt has fever 102.9 at noon, took tylenol and rechecked now it is 101.7. She feels nauseated and gagging, no vomiting, able to keep fluids down. Please advise?

## 2015-02-26 NOTE — ED Provider Notes (Signed)
CSN: [id number]     Arrival date & time 02/26/15  1359 History   First MD Initiated Contact with Patient 02/26/15 1522     Chief Complaint  Patient presents with  . Urinary Tract Infection  . Fever     (Consider location/radiation/quality/duration/timing/severity/associated sxs/prior Treatment) Patient is a 69 y.o. female presenting with urinary tract infection, fever, and general illness.  Urinary Tract Infection Associated symptoms: fever and nausea   Fever Associated symptoms: nausea   Associated symptoms: no chest pain, no congestion, no cough and no rash   Illness Location:  All over Quality:  Nausea, vomiting Severity:  Mild Onset quality:  Gradual Duration:  5 days Timing:  Constant Progression:  Worsening Chronicity:  New Context:  W/ UTI Ineffective treatments:  Ciprofloxacin, macrobid Associated symptoms: fever and nausea   Associated symptoms: no chest pain, no congestion, no cough and no rash     Past Medical History  Diagnosis Date  . Hyperlipidemia   . Hypertension   . Vitamin D deficiency   . Depression   . Allergy   . Asthma   . Urinary incontinence   . GERD (gastroesophageal reflux disease)   . Detrusor instability   . Osteopenia   . Arthritis   . History of skin cancer   . Diverticulosis     not symptomatic  . History of kidney stones   . Cancer ([place])     hx skin cancer  . History of pancreatitis     April 2015   Past Surgical History  Procedure Laterality Date  . Breast biopsy  1972    benign  . Colonoscopy  06-24-12    per Dr. Earlean Shawl, benign polyps, repeat in 5 yrs    . Tubal ligation    . Tonsillectomy    . Abdominal hysterectomy  1994    TAH/ BLADDER SUSPENSION  . Cholecystectomy N/A 07/12/2013    Procedure: LAPAROSCOPIC CHOLECYSTECTOMY WITH INTRAOPERATIVE CHOLANGIOGRAM;  Surgeon: Edward Jolly, MD;  Location: [facility];  Service: General;  Laterality: N/A;  . Ercp N/A 07/14/2013    Procedure: ENDOSCOPIC RETROGRADE  CHOLANGIOPANCREATOGRAPHY (ERCP);  Surgeon: Gatha Mayer, MD;  Location: [facility];  Service: Endoscopy;  Laterality: N/A;  . Total hip arthroplasty Right 01/13/2015    Procedure: RIGHT TOTAL HIP ARTHROPLASTY ANTERIOR APPROACH;  Surgeon: Gaynelle Arabian, MD;  Location: [facility];  Service: Orthopedics;  Laterality: Right;   Family History  Problem Relation Age of Onset  . Hypertension Mother   . Osteoporosis Mother   . Heart disease Father   . Osteoporosis Sister   . Breast cancer Paternal Aunt     Age 37's  . Diabetes Paternal Uncle    Social History  Substance Use Topics  . Smoking status: Former Smoker -- 42 years    Types: Cigarettes    Quit date: 07/09/2008  . Smokeless tobacco: Never Used     Comment: quit 6 months ago  . Alcohol Use: 1.2 oz/week    2 Glasses of wine, 0 Standard drinks or equivalent per week     Comment: occ   OB History    Gravida Para Term Preterm AB TAB SAB Ectopic Multiple Living   2 1 1  1  1   1      Review of Systems  Constitutional: Positive for fever.  HENT: Negative for congestion.   Eyes: Negative for pain.  Respiratory: Negative for cough and choking.   Cardiovascular: Negative for chest pain.  Gastrointestinal: Positive for  nausea.  Skin: Negative for rash.  All other systems reviewed and are negative.     Allergies  Ace inhibitors; Codeine; Fentanyl; and Statins  Home Medications   Prior to Admission medications   Medication Sig Start Date End Date Taking? Authorizing Provider  acetaminophen (TYLENOL) 500 MG tablet Take 1,000 mg by mouth every 6 (six) hours as needed for moderate pain, fever or headache.   Yes Historical Provider, MD  Aspirin-Calcium Carbonate 81-300 MG TABS Take 1 tablet by mouth daily.   Yes Historical Provider, MD  buPROPion (WELLBUTRIN XL) 300 MG 24 hr tablet Take 300 mg by mouth daily.     Yes Historical Provider, MD  Cholecalciferol (VITAMIN D PO) Take 3,000 Units by mouth daily.   Yes Historical Provider,  MD  esomeprazole (NEXIUM) 20 MG capsule Take 20 mg by mouth daily.    Yes Historical Provider, MD  ezetimibe (ZETIA) 10 MG tablet Take 1 tablet (10 mg total) by mouth daily. 12/04/14  Yes Laurey Morale, MD  fenofibrate 160 MG tablet TAKE 1 TABLET BY MOUTH EVERY DAY 12/04/14  Yes Laurey Morale, MD  methocarbamol (ROBAXIN) 500 MG tablet Take 500 mg by mouth 3 (three) times daily as needed. Muscle spasms 02/01/15  Yes Historical Provider, MD  metoprolol succinate (TOPROL-XL) 25 MG 24 hr tablet Take 1 tablet (25 mg total) by mouth daily. 01/07/14  Yes Laurey Morale, MD  sertraline (ZOLOFT) 100 MG tablet Take 200 mg by mouth daily.    Yes Historical Provider, MD  cephALEXin (KEFLEX) 500 MG capsule Take 1 capsule (500 mg total) by mouth 4 (four) times daily. 02/26/15   Merrily Pew, MD  ondansetron (ZOFRAN) 8 MG tablet Take 1 tablet (8 mg total) by mouth every 8 (eight) hours as needed for nausea. 02/26/15   Corene Cornea Presley Gora, MD   BP 145/75 mmHg  Pulse 83  Temp(Src) 98.4 F (36.9 C) (Oral)  Resp 16  Wt 168 lb (76.204 kg)  SpO2 97% Physical Exam  Constitutional: She is oriented to person, place, and time. She appears well-developed and well-nourished.  HENT:  Head: Normocephalic and atraumatic.  Eyes: Conjunctivae are normal. Pupils are equal, round, and reactive to light.  Neck: Normal range of motion.  Cardiovascular: Regular rhythm.  Tachycardia present.   Pulmonary/Chest: Effort normal and breath sounds normal. No stridor. No respiratory distress.  Abdominal: Soft. She exhibits no distension. There is no tenderness.  Musculoskeletal: Normal range of motion. She exhibits no edema or tenderness.  Neurological: She is alert and oriented to person, place, and time.  Nursing note and vitals reviewed.   ED Course  Procedures (including critical care time) Labs Review Labs Reviewed  COMPREHENSIVE METABOLIC PANEL - Abnormal; Notable for the following:    Glucose, Bld 114 (*)    Creatinine, Ser 1.11  (*)    AST 484 (*)    ALT 365 (*)    Alkaline Phosphatase 191 (*)    Total Bilirubin 1.3 (*)    GFR calc non Af Amer 50 (*)    GFR calc Af Amer 58 (*)    All other components within normal limits  CBC WITH DIFFERENTIAL/PLATELET - Abnormal; Notable for the following:    Lymphs Abs 0.4 (*)    All other components within normal limits  URINALYSIS, ROUTINE W REFLEX MICROSCOPIC (NOT AT [facility]) - Abnormal; Notable for the following:    Color, Urine AMBER (*)    Hgb urine dipstick TRACE (*)    Bilirubin  Urine SMALL (*)    Leukocytes, UA SMALL (*)    All other components within normal limits  URINE MICROSCOPIC-ADD ON - Abnormal; Notable for the following:    Squamous Epithelial / LPF 0-5 (*)    Bacteria, UA RARE (*)    Casts HYALINE CASTS (*)    Crystals CA OXALATE CRYSTALS (*)    All other components within normal limits  ACETAMINOPHEN LEVEL - Abnormal; Notable for the following:    Acetaminophen (Tylenol), Serum <10 (*)    All other components within normal limits  I-STAT CG4 LACTIC ACID, ED - Abnormal; Notable for the following:    Lactic Acid, Venous 2.97 (*)    All other components within normal limits  URINE CULTURE  HEPATITIS PANEL, ACUTE  I-STAT CG4 LACTIC ACID, ED    Imaging Review US Abdomen Limited Ruq  02/26/2015  CLINICAL DATA:  Elevated LFTs. EXAM: US ABDOMEN LIMITED - RIGHT UPPER QUADRANT COMPARISON:  CT abdomen pelvis 08/16/2014. FINDINGS: Gallbladder: Status post cholecystectomy. Common bile duct: Diameter: Within normal limits, 7 mm. Liver: No focal lesion identified. Within normal limits in parenchymal echogenicity. IMPRESSION: Negative post cholecystectomy exam. Electronically Signed   By: Staci Righter M.D.   On: 02/26/2015 16:56   I have personally reviewed and evaluated these images and lab results as part of my medical decision-making.   EKG Interpretation None      MDM   Final diagnoses:  UTI (lower urinary tract infection)   Uti, not sensitive to  cipro, failed macrobid. Technically septic with an elevated lactic acid however after 2 L of fluid patient still appeared well and was tolerating by mouth medications and a lactic acid cleared. She is not tachycardic, hypotensive and did not have a leukocytosis. The patient is stable for discharge after having given Rocephin here. She will start taking Keflex tomorrow and follow with primary doctor for further management. She had elevated liver enzymes could be related to her acute illness as she doesn't drink alcohol, her Tylenol is negative and has no history of hepatitis or cirrhosis. She will follow-up with her primary doctor for that as well in a week for repeat testing.     Merrily Pew, MD 02/26/15 [phone]

## 2015-02-26 NOTE — Telephone Encounter (Signed)
I spoke with pt's spouse and went over below information, he agreed to take pt to [facility] ER for evaluation.

## 2015-02-26 NOTE — Telephone Encounter (Signed)
I suggest she go to the ER because this is a very high fever for an adult. She would need lab work and other things that we cannot do here.

## 2015-02-26 NOTE — ED Notes (Signed)
Bed: WTR6 Expected date:  Expected time:  Means of arrival:  Comments: [place] in room

## 2015-02-26 NOTE — ED Notes (Signed)
Pt was seen by pcp on 12/2 and dx with UTI, started on cipro and macrobid, cipro stopped after culture came back. Pt started have a fever and nausea since 12/6, pt was prescribed zofran. Today pt reports temperature was 102.6, pt took 1000mg  tylenol at 1130. Pt afebrile. Pt denies pain. Reports nausea.

## 2015-02-27 LAB — HEPATITIS PANEL, ACUTE
HEP B S AG: NEGATIVE
Hep A IgM: NEGATIVE
Hep B C IgM: NEGATIVE

## 2015-02-28 LAB — URINE CULTURE

## 2015-03-05 ENCOUNTER — Encounter: Payer: Self-pay | Admitting: Family Medicine

## 2015-03-05 ENCOUNTER — Ambulatory Visit (INDEPENDENT_AMBULATORY_CARE_PROVIDER_SITE_OTHER): Payer: Medicare Other | Admitting: Family Medicine

## 2015-03-05 VITALS — BP 127/84 | HR 63 | Temp 98.3°F | Ht 65.0 in | Wt 166.0 lb

## 2015-03-05 DIAGNOSIS — N390 Urinary tract infection, site not specified: Secondary | ICD-10-CM | POA: Diagnosis not present

## 2015-03-05 DIAGNOSIS — B349 Viral infection, unspecified: Secondary | ICD-10-CM

## 2015-03-05 LAB — POCT URINALYSIS DIPSTICK
Blood, UA: NEGATIVE
GLUCOSE UA: NEGATIVE
KETONES UA: NEGATIVE
Nitrite, UA: NEGATIVE
PH UA: 7
Spec Grav, UA: 1.025
Urobilinogen, UA: 2

## 2015-03-05 LAB — HEPATIC FUNCTION PANEL
ALBUMIN: 4.6 g/dL (ref 3.5–5.2)
ALK PHOS: 110 U/L (ref 39–117)
ALT: 45 U/L — AB (ref 0–35)
AST: 21 U/L (ref 0–37)
Bilirubin, Direct: 0.1 mg/dL (ref 0.0–0.3)
TOTAL PROTEIN: 7.1 g/dL (ref 6.0–8.3)
Total Bilirubin: 0.4 mg/dL (ref 0.2–1.2)

## 2015-03-05 LAB — AMYLASE: Amylase: 48 U/L (ref 27–131)

## 2015-03-05 LAB — LIPASE: Lipase: 20 U/L (ref 11.0–59.0)

## 2015-03-05 NOTE — Progress Notes (Signed)
Pre visit review using our clinic review tool, if applicable. No additional management support is needed unless otherwise documented below in the visit note. 

## 2015-03-05 NOTE — Progress Notes (Signed)
   Subjective:    Patient ID: Maria Schneider, female    DOB: [birth year], 69 y.o.   MRN: [id number]  HPI Here to follow up an ER visit on 02-26-15 for fevers. She was diagnosed with a UTI, and was started on Keflex. We had seen her 2 weeks before that and had started her on Cipro for a UTI. The culture grew an E coli that was resistant to Cipro, so she was switched to [place]. At that point she started to feel much better for a few days. Then she suddenly developed a fever to 102.6 degrees and nausea. No diarrhea or jaundice or abdominal pain. When she went to the ER her exam was fairly unremarkable though she was dehydrated. Her labs revealed a normal CBC and elevated liver enzymes. Her TB was 1.3, AP was 191, AST was 484, and ALT was 365. The acute hepatitis panel was negative and the abdominal US was clear. She was given a shot of Rocephin, and the Macrobid was switched to Keflex as above. She quickly improved, the fever went away, and the nausea resolved. Today she feels totally back to normal.    Review of Systems  Constitutional: Negative.   Respiratory: Negative.   Cardiovascular: Negative.   Gastrointestinal: Negative.   Genitourinary: Negative.   Neurological: Negative.        Objective:   Physical Exam  Constitutional: She is oriented to person, place, and time. She appears well-developed and well-nourished.  Eyes: Conjunctivae are normal. No scleral icterus.  Neck: No thyromegaly present.  Cardiovascular: Normal rate, regular rhythm, normal heart sounds and intact distal pulses.   Pulmonary/Chest: Effort normal and breath sounds normal.  Abdominal: Soft. Bowel sounds are normal. She exhibits no distension and no mass. There is no tenderness. There is no rebound and no guarding.  No HSM   Lymphadenopathy:    She has no cervical adenopathy.  Neurological: She is alert and oriented to person, place, and time.          Assessment & Plan:  She is recovering from a UTI, and  from what in retrospect was probably a viral illness that caused liver inflammation. She tested negative for hepatitis A, B, and C. She seems to be back to baseline today. We will retest the liver enzymes and also check for EBV and CMV titers.

## 2015-03-08 LAB — EPSTEIN-BARR VIRUS VCA, IGM

## 2015-03-08 LAB — EPSTEIN-BARR VIRUS VCA, IGG: EBV VCA IGG: 194 U/mL — AB (ref <18.0)

## 2015-03-09 LAB — CMV IGM: CMV IgM: <8 AU/mL (ref <30.00)

## 2015-03-09 LAB — CYTOMEGALOVIRUS ANTIBODY, IGG: Cytomegalovirus Ab-IgG: <0.2 U/mL (ref <0.60)

## 2015-03-11 ENCOUNTER — Encounter: Payer: Self-pay | Admitting: Family Medicine

## 2015-03-12 ENCOUNTER — Encounter: Payer: Self-pay | Admitting: Family Medicine

## 2015-03-12 NOTE — Telephone Encounter (Signed)
No follow up is needed

## 2015-03-16 ENCOUNTER — Telehealth: Payer: Self-pay | Admitting: Family Medicine

## 2015-03-16 NOTE — Telephone Encounter (Signed)
Maria Schneider called saying she's recovering from Mononucleosis. She's not on medication but most of her labs were fine. She's supposed to be around children and a pregnant woman today and she wants to make sure that's ok. She'd like a phone call regarding this.  Pt's ph# [phone] Thank you.

## 2015-03-16 NOTE — Telephone Encounter (Signed)
Please advise 

## 2015-03-17 NOTE — Telephone Encounter (Signed)
She would be fine to be around others, she is not contagious at this point

## 2015-03-17 NOTE — Telephone Encounter (Signed)
Pt is aware.  

## 2015-03-17 NOTE — Telephone Encounter (Signed)
This resolves on its own, no follow up needed

## 2015-03-31 ENCOUNTER — Ambulatory Visit: Payer: Self-pay | Admitting: Family Medicine

## 2015-04-01 ENCOUNTER — Ambulatory Visit (INDEPENDENT_AMBULATORY_CARE_PROVIDER_SITE_OTHER): Payer: Medicare Other | Admitting: Family Medicine

## 2015-04-01 ENCOUNTER — Encounter: Payer: Self-pay | Admitting: Family Medicine

## 2015-04-01 VITALS — BP 140/90 | Temp 98.1°F | Ht 65.0 in | Wt 171.5 lb

## 2015-04-01 DIAGNOSIS — J019 Acute sinusitis, unspecified: Secondary | ICD-10-CM | POA: Diagnosis not present

## 2015-04-01 MED ORDER — METHYLPREDNISOLONE ACETATE 80 MG/ML IJ SUSP
120.0000 mg | Freq: Once | INTRAMUSCULAR | Status: AC
  Administered 2015-04-01: 120 mg via INTRAMUSCULAR

## 2015-04-01 MED ORDER — AMOXICILLIN-POT CLAVULANATE 875-125 MG PO TABS: 1.0000 | ORAL_TABLET | Freq: Two times a day (BID) | ORAL | Status: DC

## 2015-04-01 NOTE — Progress Notes (Signed)
Pre visit review using our clinic review tool, if applicable. No additional management support is needed unless otherwise documented below in the visit note. 

## 2015-04-01 NOTE — Addendum Note (Signed)
Addended by: Aggie Hacker A on: 04/01/2015 11:56 AM   Modules accepted: Orders

## 2015-04-01 NOTE — Progress Notes (Signed)
   Subjective:    Patient ID: Maria Schneider, female    DOB: 11/07/1945, 70 y.o.   MRN: [id number]  HPI Here for one week of sinus pressure, frontal HA, PND and a ST. No fever or cough.    Review of Systems  Constitutional: Negative.   HENT: Positive for congestion, postnasal drip, sinus pressure and sore throat. Negative for ear pain.   Eyes: Negative.   Respiratory: Negative for cough.        Objective:   Physical Exam  Constitutional: She appears well-developed and well-nourished.  HENT:  Right Ear: External ear normal.  Left Ear: External ear normal.  Nose: Nose normal.  Mouth/Throat: Oropharynx is clear and moist.  Eyes: Conjunctivae are normal.  Neck: No thyromegaly present.  Pulmonary/Chest: Effort normal and breath sounds normal.  Lymphadenopathy:    She has no cervical adenopathy.          Assessment & Plan:  Sinusitis, treat with a steroid shot and Augmentin.

## 2015-04-23 ENCOUNTER — Encounter: Payer: Self-pay | Admitting: Family Medicine

## 2015-04-23 NOTE — Telephone Encounter (Signed)
Tell her to take maximum strength Mucinex D twice a day

## 2015-05-10 ENCOUNTER — Encounter: Payer: Self-pay | Admitting: Family Medicine

## 2015-05-10 ENCOUNTER — Ambulatory Visit (INDEPENDENT_AMBULATORY_CARE_PROVIDER_SITE_OTHER): Payer: Medicare Other | Admitting: Family Medicine

## 2015-05-10 VITALS — BP 134/89 | HR 55 | Temp 98.0°F | Ht 65.0 in | Wt 170.0 lb

## 2015-05-10 DIAGNOSIS — G44209 Tension-type headache, unspecified, not intractable: Secondary | ICD-10-CM | POA: Diagnosis not present

## 2015-05-10 MED ORDER — CYCLOBENZAPRINE HCL 10 MG PO TABS: 10.0000 mg | ORAL_TABLET | Freq: Three times a day (TID) | ORAL | Status: DC | PRN

## 2015-05-10 MED ORDER — DICLOFENAC SODIUM 75 MG PO TBEC: 75.0000 mg | DELAYED_RELEASE_TABLET | Freq: Two times a day (BID) | ORAL | Status: DC

## 2015-05-10 NOTE — Progress Notes (Signed)
Pre visit review using our clinic review tool, if applicable. No additional management support is needed unless otherwise documented below in the visit note. 

## 2015-05-10 NOTE — Progress Notes (Signed)
   Subjective:    Patient ID: Maria Schneider, female    DOB: [birth year], 70 y.o.   MRN: [id number]  HPI Here to follow up on headaches that started about 3 weeks ago. She was here 2 weeks ago and described frontal  headaches with apparent sinus congestion. She was given a steroid shot and a course of Augmentin. These did not help at all. She still has headaches but now she describes them more as being in the temples. No vision changes. No pain with chewing.    Review of Systems  Constitutional: Negative.   Eyes: Negative.   Respiratory: Negative.   Neurological: Positive for headaches. Negative for dizziness, tremors, seizures, syncope, facial asymmetry, speech difficulty, weakness, light-headedness and numbness.       Objective:   Physical Exam  Constitutional: She is oriented to person, place, and time. She appears well-developed and well-nourished. No distress.  HENT:  Head: Normocephalic and atraumatic.  Right Ear: External ear normal.  Left Ear: External ear normal.  Nose: Nose normal.  Mouth/Throat: Oropharynx is clear and moist.  No TMJ tenderness. She is tender over the temples   Eyes: Conjunctivae and EOM are normal. Pupils are equal, round, and reactive to light.  Neck: Neck supple. No thyromegaly present.  Lymphadenopathy:    She has no cervical adenopathy.  Neurological: She is alert and oriented to person, place, and time. No cranial nerve deficit. She exhibits normal muscle tone. Coordination normal.          Assessment & Plan:  Possible tension headaches. Try Diclofenac bid and Flexeril at bedtime. She will contact her dentist about the possibility of making a bite guard to sleep in.

## 2015-05-18 ENCOUNTER — Encounter: Payer: Self-pay | Admitting: Family Medicine

## 2015-05-18 NOTE — Addendum Note (Signed)
Addended by: [name] A on: 05/18/2015 09:41 PM   Modules accepted: Orders

## 2015-05-18 NOTE — Telephone Encounter (Signed)
A head CT was ordered

## 2015-05-20 ENCOUNTER — Other Ambulatory Visit: Payer: Self-pay

## 2015-05-24 ENCOUNTER — Ambulatory Visit (INDEPENDENT_AMBULATORY_CARE_PROVIDER_SITE_OTHER)
Admission: RE | Admit: 2015-05-24 | Discharge: 2015-05-24 | Disposition: A | Discharge status: Home / Self Care | Payer: Medicare Other | Source: Ambulatory Visit | Attending: Family Medicine | Admitting: Family Medicine

## 2015-05-24 DIAGNOSIS — G44209 Tension-type headache, unspecified, not intractable: Secondary | ICD-10-CM | POA: Diagnosis not present

## 2015-05-30 ENCOUNTER — Encounter: Payer: Self-pay | Admitting: Family Medicine

## 2015-05-30 DIAGNOSIS — G8929 Other chronic pain: Secondary | ICD-10-CM

## 2015-05-30 DIAGNOSIS — R51 Headache: Principal | ICD-10-CM

## 2015-05-30 DIAGNOSIS — R519 Headache, unspecified: Secondary | ICD-10-CM

## 2015-06-01 NOTE — Telephone Encounter (Signed)
I suggest she see a Neurologist about these. I will do a referral to set this up

## 2015-06-09 ENCOUNTER — Emergency Department (HOSPITAL_COMMUNITY): Payer: Medicare Other

## 2015-06-09 ENCOUNTER — Emergency Department (HOSPITAL_COMMUNITY)
Admission: EM | Admit: 2015-06-09 | Discharge: 2015-06-09 | Disposition: A | Discharge status: Home / Self Care | Payer: Medicare Other | Attending: Emergency Medicine | Admitting: Emergency Medicine

## 2015-06-09 ENCOUNTER — Encounter (HOSPITAL_COMMUNITY): Payer: Self-pay | Admitting: Emergency Medicine

## 2015-06-09 DIAGNOSIS — E785 Hyperlipidemia, unspecified: Secondary | ICD-10-CM | POA: Insufficient documentation

## 2015-06-09 DIAGNOSIS — R000 Tachycardia, unspecified: Secondary | ICD-10-CM | POA: Insufficient documentation

## 2015-06-09 DIAGNOSIS — Z87891 Personal history of nicotine dependence: Secondary | ICD-10-CM | POA: Insufficient documentation

## 2015-06-09 DIAGNOSIS — R197 Diarrhea, unspecified: Secondary | ICD-10-CM | POA: Diagnosis not present

## 2015-06-09 DIAGNOSIS — Z85828 Personal history of other malignant neoplasm of skin: Secondary | ICD-10-CM | POA: Diagnosis not present

## 2015-06-09 DIAGNOSIS — K219 Gastro-esophageal reflux disease without esophagitis: Secondary | ICD-10-CM | POA: Diagnosis not present

## 2015-06-09 DIAGNOSIS — Z90710 Acquired absence of both cervix and uterus: Secondary | ICD-10-CM | POA: Insufficient documentation

## 2015-06-09 DIAGNOSIS — G8929 Other chronic pain: Secondary | ICD-10-CM | POA: Diagnosis not present

## 2015-06-09 DIAGNOSIS — Z87442 Personal history of urinary calculi: Secondary | ICD-10-CM | POA: Insufficient documentation

## 2015-06-09 DIAGNOSIS — R1110 Vomiting, unspecified: Secondary | ICD-10-CM | POA: Insufficient documentation

## 2015-06-09 DIAGNOSIS — F329 Major depressive disorder, single episode, unspecified: Secondary | ICD-10-CM | POA: Insufficient documentation

## 2015-06-09 DIAGNOSIS — R51 Headache: Secondary | ICD-10-CM | POA: Diagnosis not present

## 2015-06-09 DIAGNOSIS — Z79899 Other long term (current) drug therapy: Secondary | ICD-10-CM | POA: Diagnosis not present

## 2015-06-09 DIAGNOSIS — Z9049 Acquired absence of other specified parts of digestive tract: Secondary | ICD-10-CM | POA: Insufficient documentation

## 2015-06-09 DIAGNOSIS — R1032 Left lower quadrant pain: Secondary | ICD-10-CM | POA: Diagnosis not present

## 2015-06-09 DIAGNOSIS — I10 Essential (primary) hypertension: Secondary | ICD-10-CM | POA: Insufficient documentation

## 2015-06-09 DIAGNOSIS — R109 Unspecified abdominal pain: Secondary | ICD-10-CM | POA: Diagnosis present

## 2015-06-09 DIAGNOSIS — M1990 Unspecified osteoarthritis, unspecified site: Secondary | ICD-10-CM | POA: Insufficient documentation

## 2015-06-09 DIAGNOSIS — E559 Vitamin D deficiency, unspecified: Secondary | ICD-10-CM | POA: Diagnosis not present

## 2015-06-09 DIAGNOSIS — M8580 Other specified disorders of bone density and structure, unspecified site: Secondary | ICD-10-CM | POA: Diagnosis not present

## 2015-06-09 DIAGNOSIS — J45909 Unspecified asthma, uncomplicated: Secondary | ICD-10-CM | POA: Insufficient documentation

## 2015-06-09 DIAGNOSIS — R1030 Lower abdominal pain, unspecified: Secondary | ICD-10-CM

## 2015-06-09 DIAGNOSIS — R519 Headache, unspecified: Secondary | ICD-10-CM

## 2015-06-09 DIAGNOSIS — Z9851 Tubal ligation status: Secondary | ICD-10-CM | POA: Insufficient documentation

## 2015-06-09 LAB — URINALYSIS, ROUTINE W REFLEX MICROSCOPIC
Glucose, UA: NEGATIVE mg/dL
Ketones, ur: NEGATIVE mg/dL
LEUKOCYTES UA: NEGATIVE
NITRITE: NEGATIVE
Protein, ur: NEGATIVE mg/dL
pH: 5.5 (ref 5.0–8.0)

## 2015-06-09 LAB — CBC WITH DIFFERENTIAL/PLATELET
BASOS PCT: 0 %
Basophils Absolute: 0 10*3/uL (ref 0.0–0.1)
EOS ABS: 0.1 10*3/uL (ref 0.0–0.7)
EOS PCT: 1 %
HCT: 44.2 % (ref 36.0–46.0)
Hemoglobin: 13.9 g/dL (ref 12.0–15.0)
LYMPHS ABS: 0.3 10*3/uL — AB (ref 0.7–4.0)
Lymphocytes Relative: 4 %
MCH: 27.3 pg (ref 26.0–34.0)
MCHC: 31.4 g/dL (ref 30.0–36.0)
MCV: 86.7 fL (ref 78.0–100.0)
Monocytes Absolute: 0.7 10*3/uL (ref 0.1–1.0)
Monocytes Relative: 10 %
NEUTROS PCT: 85 %
Neutro Abs: 5.7 10*3/uL (ref 1.7–7.7)
PLATELETS: 193 10*3/uL (ref 150–400)
RBC: 5.1 MIL/uL (ref 3.87–5.11)
RDW: 13.4 % (ref 11.5–15.5)
WBC: 6.7 10*3/uL (ref 4.0–10.5)

## 2015-06-09 LAB — COMPREHENSIVE METABOLIC PANEL
ALBUMIN: 4.7 g/dL (ref 3.5–5.0)
ALT: 37 U/L (ref 14–54)
ANION GAP: 12 (ref 5–15)
AST: 43 U/L — ABNORMAL HIGH (ref 15–41)
Alkaline Phosphatase: 67 U/L (ref 38–126)
BUN: 22 mg/dL — ABNORMAL HIGH (ref 6–20)
CHLORIDE: 103 mmol/L (ref 101–111)
CO2: 23 mmol/L (ref 22–32)
Calcium: 9.8 mg/dL (ref 8.9–10.3)
Creatinine, Ser: 0.98 mg/dL (ref 0.44–1.00)
GFR calc non Af Amer: 58 mL/min — ABNORMAL LOW (ref >60)
GLUCOSE: 121 mg/dL — AB (ref 65–99)
Potassium: 4.1 mmol/L (ref 3.5–5.1)
SODIUM: 138 mmol/L (ref 135–145)
Total Bilirubin: 0.7 mg/dL (ref 0.3–1.2)
Total Protein: 7.9 g/dL (ref 6.5–8.1)

## 2015-06-09 LAB — URINE MICROSCOPIC-ADD ON

## 2015-06-09 LAB — SEDIMENTATION RATE: Sed Rate: 13 mm/hr (ref 0–22)

## 2015-06-09 LAB — LIPASE, BLOOD: Lipase: 30 U/L (ref 11–51)

## 2015-06-09 MED ORDER — ONDANSETRON HCL 4 MG/2ML IJ SOLN
4.0000 mg | Freq: Once | INTRAMUSCULAR | Status: AC
  Administered 2015-06-09: 4 mg via INTRAVENOUS
  Filled 2015-06-09: qty 2

## 2015-06-09 MED ORDER — ONDANSETRON HCL 4 MG/2ML IJ SOLN: 4.0000 mg | Freq: Once | INTRAMUSCULAR | Status: DC

## 2015-06-09 MED ORDER — IOPAMIDOL (ISOVUE-300) INJECTION 61%
100.0000 mL | Freq: Once | INTRAVENOUS | Status: AC | PRN
  Administered 2015-06-09: 100 mL via INTRAVENOUS

## 2015-06-09 MED ORDER — ONDANSETRON HCL 4 MG PO TABS: 4.0000 mg | ORAL_TABLET | Freq: Three times a day (TID) | ORAL | Status: DC | PRN

## 2015-06-09 MED ORDER — MORPHINE SULFATE (PF) 4 MG/ML IV SOLN
2.0000 mg | Freq: Once | INTRAVENOUS | Status: AC
  Administered 2015-06-09: 2 mg via INTRAVENOUS
  Filled 2015-06-09: qty 1

## 2015-06-09 MED ORDER — SODIUM CHLORIDE 0.9 % IV BOLUS (SEPSIS)
1000.0000 mL | Freq: Once | INTRAVENOUS | Status: AC
  Administered 2015-06-09: 1000 mL via INTRAVENOUS

## 2015-06-09 MED ORDER — PROMETHAZINE HCL 25 MG/ML IJ SOLN
12.5000 mg | Freq: Once | INTRAMUSCULAR | Status: AC | PRN
  Administered 2015-06-09: 12.5 mg via INTRAVENOUS
  Filled 2015-06-09: qty 1

## 2015-06-09 NOTE — ED Provider Notes (Signed)
CSN: [id number]     Arrival date & time 06/09/15  0454 History   First MD Initiated Contact with Patient 06/09/15 0459     Chief Complaint  Patient presents with  . Abdominal Pain  . Nausea  . Emesis     (Consider location/radiation/quality/duration/timing/severity/associated sxs/prior Treatment) HPI Comments: 70 year old female with a history of dyslipidemia, hypertension, esophageal reflux, diverticulosis, and asthma presents to the emergency department for evaluation of abdominal pain. Patient reports a cramping pain, "like gas pain" to her central abdomen onset today. Symptoms preceded by diarrhea which began 2 days ago. Patient with 2-3 loose stools per day, all nonbloody. Patient developed nausea and vomiting a few hours ago. She had 2-3 episodes of emesis PTA. No hematemesis. Patient reports feeling lightheaded with her symptoms. No medications taken prior to arrival. No reported sick contacts, though patient did volunteer in the hospital one week ago. Patient has not had any fever, urinary symptoms, chest pain, shortness of breath, or loss of consciousness. Abdominal surgical history notable for cholecystectomy, hysterectomy, and tubal ligation.  Husband also expresses concern over a headache which the patient has had for the past 8 weeks. Patient states that it feels as though there is "a mask on my face". Pain has been fairly constant. She is scheduled to see a neurologist for further evaluation of her symptoms on 06/22/2015. Patient has had a negative CT scan on an outpatient basis. No hx of pseudotumor or temporal arteritis.   The history is provided by the patient and the spouse. No language interpreter was used.    Past Medical History  Diagnosis Date  . Hyperlipidemia   . Hypertension   . Vitamin D deficiency   . Depression   . Allergy   . Asthma   . Urinary incontinence   . GERD (gastroesophageal reflux disease)   . Detrusor instability   . Osteopenia   . Arthritis   .  History of skin cancer   . Diverticulosis     not symptomatic  . History of kidney stones   . Cancer ([place])     hx skin cancer  . History of pancreatitis     April 2015   Past Surgical History  Procedure Laterality Date  . Breast biopsy  1972    benign  . Colonoscopy  06-24-12    per Dr. Earlean Shawl, benign polyps, repeat in 5 yrs    . Tubal ligation    . Tonsillectomy    . Abdominal hysterectomy  1994    TAH/ BLADDER SUSPENSION  . Cholecystectomy N/A 07/12/2013    Procedure: LAPAROSCOPIC CHOLECYSTECTOMY WITH INTRAOPERATIVE CHOLANGIOGRAM;  Surgeon: Edward Jolly, MD;  Location: [facility];  Service: General;  Laterality: N/A;  . Ercp N/A 07/14/2013    Procedure: ENDOSCOPIC RETROGRADE CHOLANGIOPANCREATOGRAPHY (ERCP);  Surgeon: Gatha Mayer, MD;  Location: [facility];  Service: Endoscopy;  Laterality: N/A;  . Total hip arthroplasty Right 01/13/2015    Procedure: RIGHT TOTAL HIP ARTHROPLASTY ANTERIOR APPROACH;  Surgeon: Gaynelle Arabian, MD;  Location: [facility];  Service: Orthopedics;  Laterality: Right;   Family History  Problem Relation Age of Onset  . Hypertension Mother   . Osteoporosis Mother   . Heart disease Father   . Osteoporosis Sister   . Breast cancer Paternal Aunt     Age 34's  . Diabetes Paternal Uncle    Social History  Substance Use Topics  . Smoking status: Former Smoker -- 42 years    Types: Cigarettes  Quit date: 07/09/2008  . Smokeless tobacco: Never Used     Comment: quit 6 months ago  . Alcohol Use: 1.2 oz/week    2 Glasses of wine, 0 Standard drinks or equivalent per week     Comment: occ   OB History    Gravida Para Term Preterm AB TAB SAB Ectopic Multiple Living   2 1 1  1  1   1       Review of Systems  Constitutional: Negative for fever.  Gastrointestinal: Positive for vomiting, abdominal pain and diarrhea.  Neurological: Positive for headaches (chronic x 8 weeks).  All other systems reviewed and are negative.   Allergies  Ace inhibitors;  Codeine; Fentanyl; and Statins  Home Medications   Prior to Admission medications   Medication Sig Start Date End Date Taking? Authorizing Provider  Aspirin-Calcium Carbonate 81-300 MG TABS Take 1 tablet by mouth daily. Reported on 03/05/2015   Yes Historical Provider, MD  buPROPion (WELLBUTRIN XL) 300 MG 24 hr tablet Take 300 mg by mouth daily.     Yes Historical Provider, MD  Cholecalciferol (VITAMIN D PO) Take 3,000 Units by mouth daily. Reported on 06/09/2015   Yes Historical Provider, MD  esomeprazole (NEXIUM) 20 MG capsule Take 20 mg by mouth daily.    Yes Historical Provider, MD  ezetimibe (ZETIA) 10 MG tablet Take 1 tablet (10 mg total) by mouth daily. 12/04/14  Yes Laurey Morale, MD  fenofibrate 160 MG tablet TAKE 1 TABLET BY MOUTH EVERY DAY Patient taking differently: TAKE 160  MG BY MOUTH EVERY DAY 12/04/14  Yes Laurey Morale, MD  metoprolol succinate (TOPROL-XL) 25 MG 24 hr tablet Take 1 tablet (25 mg total) by mouth daily. 01/07/14  Yes Laurey Morale, MD  sertraline (ZOLOFT) 100 MG tablet Take 200 mg by mouth daily.    Yes Historical Provider, MD  cyclobenzaprine (FLEXERIL) 10 MG tablet Take 1 tablet (10 mg total) by mouth 3 (three) times daily as needed for muscle spasms. Patient not taking: Reported on 06/09/2015 05/10/15   Laurey Morale, MD  diclofenac (VOLTAREN) 75 MG EC tablet Take 1 tablet (75 mg total) by mouth 2 (two) times daily. Patient not taking: Reported on 06/09/2015 05/10/15   Laurey Morale, MD   BP 153/86 mmHg  Pulse 116  Temp(Src) 97.5 F (36.4 C)  Resp 16  SpO2 96%   Physical Exam  Constitutional: She is oriented to person, place, and time. She appears well-developed and well-nourished. No distress.  Nontoxic/nonseptic appearing  HENT:  Head: Normocephalic and atraumatic.  Eyes: Conjunctivae and EOM are normal. No scleral icterus.  Neck: Normal range of motion.  Cardiovascular: Regular rhythm and intact distal pulses.   Mild tachycardia  Pulmonary/Chest:  Effort normal and breath sounds normal. No respiratory distress. She has no wheezes. She has no rales.  Respirations even and unlabored  Abdominal: Soft. She exhibits no distension. There is tenderness. There is no rebound and no guarding.  Soft abdomen with focal tenderness in the suprapubic and left lower quadrant. No masses or peritoneal signs. Normoactive bowel sounds.  Musculoskeletal: Normal range of motion.  Neurological: She is alert and oriented to person, place, and time. She exhibits normal muscle tone. Coordination normal.  GCS 15. Speech is goal oriented. No focal neurologic deficits appreciated. Patient moves extremities without ataxia.  Skin: Skin is warm and dry. No rash noted. She is not diaphoretic. No erythema. No pallor.  Psychiatric: She has a normal mood and  affect. Her behavior is normal.  Nursing note and vitals reviewed.   ED Course  Procedures (including critical care time) Labs Review Labs Reviewed  CBC WITH DIFFERENTIAL/PLATELET  COMPREHENSIVE METABOLIC PANEL  LIPASE, BLOOD  URINALYSIS, ROUTINE W REFLEX MICROSCOPIC (NOT AT [facility])  SEDIMENTATION RATE    Imaging Review No results found.   I have personally reviewed and evaluated these images and lab results as part of my medical decision-making.   EKG Interpretation None      MDM   Final diagnoses:  Lower abdominal pain  Vomiting and diarrhea  Chronic intractable headache, unspecified headache type    70 year old female presents to the ED for evaluation of vomiting and diarrhea with associated abdominal pain. CT pending to evaluate for diverticulitis versus other acute process. Blood work is also pending. Patient has been ordered IV fluids as well as Zofran and morphine. Husband raises concern over a chronic headache which the patient has had for the past 8 weeks. She has had a negative CT scan already to rule out mass, hemorrhage, hydrocephalus. She is scheduled to see both ophthalmology and  neurology in the coming weeks. Sedimentation rate added to blood work to evaluate for potential temporal arteritis. Patient has a nonfocal neurologic exam today. No fever, nuchal rigidity, or meningismus to suggest meningitis.  Patient signed out to [facility], PA-C at change of shift who will follow-up on labs and imaging and disposition appropriately.    Antonietta Breach, PA-C 06/09/15 [id number]  Varney Biles, MD 06/09/15 [phone]

## 2015-06-09 NOTE — ED Notes (Signed)
Aware of need for urine sample 

## 2015-06-09 NOTE — ED Provider Notes (Signed)
6:08 AM Patient signed out to me at change of shift by Antonietta Breach, PA-C. Pt with N/V/D, abdominal pain, suprapubic and LLQ tenderness.  Pending CT abd/pelvis r/o diverticulitis.  Will likely be able to d/c home with PO medications if no other factors prohibit this.  Pt also with 8 weeks of headache, has had negative head CT, with both neurology and ophthalomology follow up appointments planned.  Sed rate also pending out of concern for possible temoral arteritis.  If sed rate high, will add steroids and continue with follow up plan as previously arranged.    8:00 AM Discussed all results to this point.  Pt feeling better.  UA pending.  Anticipate discharge after result.    UA unremarkable.  D/C home.    Discussed result, findings, treatment, and follow up  with patient.  Pt given return precautions.  Pt verbalizes understanding and agrees with plan.      Results for orders placed or performed during the hospital encounter of 06/09/15  CBC with Differential  Result Value Ref Range   WBC 6.7 4.0 - 10.5 K/uL   RBC 5.10 3.87 - 5.11 MIL/uL   Hemoglobin 13.9 12.0 - 15.0 g/dL   HCT 44.2 36.0 - 46.0 %   MCV 86.7 78.0 - 100.0 fL   MCH 27.3 26.0 - 34.0 pg   MCHC 31.4 30.0 - 36.0 g/dL   RDW 13.4 11.5 - 15.5 %   Platelets 193 150 - 400 K/uL   Neutrophils Relative % 85 %   Neutro Abs 5.7 1.7 - 7.7 K/uL   Lymphocytes Relative 4 %   Lymphs Abs 0.3 (L) 0.7 - 4.0 K/uL   Monocytes Relative 10 %   Monocytes Absolute 0.7 0.1 - 1.0 K/uL   Eosinophils Relative 1 %   Eosinophils Absolute 0.1 0.0 - 0.7 K/uL   Basophils Relative 0 %   Basophils Absolute 0.0 0.0 - 0.1 K/uL  Comprehensive metabolic panel  Result Value Ref Range   Sodium 138 135 - 145 mmol/L   Potassium 4.1 3.5 - 5.1 mmol/L   Chloride 103 101 - 111 mmol/L   CO2 23 22 - 32 mmol/L   Glucose, Bld 121 (H) 65 - 99 mg/dL   BUN 22 (H) 6 - 20 mg/dL   Creatinine, Ser 0.98 0.44 - 1.00 mg/dL   Calcium 9.8 8.9 - 10.3 mg/dL   Total Protein 7.9 6.5 -  8.1 g/dL   Albumin 4.7 3.5 - 5.0 g/dL   AST 43 (H) 15 - 41 U/L   ALT 37 14 - 54 U/L   Alkaline Phosphatase 67 38 - 126 U/L   Total Bilirubin 0.7 0.3 - 1.2 mg/dL   GFR calc non Af Amer 58 (L) >60 mL/min   GFR calc Af Amer >60 >60 mL/min   Anion gap 12 5 - 15  Lipase, blood  Result Value Ref Range   Lipase 30 11 - 51 U/L  Urinalysis, Routine w reflex microscopic (not at [facility])  Result Value Ref Range   Color, Urine YELLOW YELLOW   APPearance CLEAR CLEAR   Specific Gravity, Urine >1.046 (H) 1.005 - 1.030   pH 5.5 5.0 - 8.0   Glucose, UA NEGATIVE NEGATIVE mg/dL   Hgb urine dipstick TRACE (A) NEGATIVE   Bilirubin Urine SMALL (A) NEGATIVE   Ketones, ur NEGATIVE NEGATIVE mg/dL   Protein, ur NEGATIVE NEGATIVE mg/dL   Nitrite NEGATIVE NEGATIVE   Leukocytes, UA NEGATIVE NEGATIVE  Sedimentation rate  Result Value Ref  Range   Sed Rate 13 0 - 22 mm/hr  Urine microscopic-add on  Result Value Ref Range   Squamous Epithelial / LPF 0-5 (A) NONE SEEN   WBC, UA 0-5 0 - 5 WBC/hpf   RBC / HPF 0-5 0 - 5 RBC/hpf   Bacteria, UA RARE (A) NONE SEEN   Casts HYALINE CASTS (A) NEGATIVE   Ct Head Wo Contrast  05/24/2015  CLINICAL DATA:  Daily headaches since December 2016. EXAM: CT HEAD WITHOUT CONTRAST TECHNIQUE: Contiguous axial images were obtained from the base of the skull through the vertex without intravenous contrast. COMPARISON:  None. FINDINGS: No acute intracranial abnormality. Specifically, no hemorrhage, hydrocephalus, mass lesion, acute infarction, or significant intracranial injury. No acute calvarial abnormality. Visualized paranasal sinuses and mastoids clear. Orbital soft tissues unremarkable. IMPRESSION: No acute intracranial abnormality. Electronically Signed   By: Rolm Baptise M.D.   On: 05/24/2015 13:27   Ct Abdomen Pelvis W Contrast  06/09/2015  CLINICAL DATA:  Nausea and vomiting for 2 hours today. Initial encounter. EXAM: CT ABDOMEN AND PELVIS WITH CONTRAST TECHNIQUE: Multidetector  CT imaging of the abdomen and pelvis was performed using the standard protocol following bolus administration of intravenous contrast. CONTRAST:  100 mL ISOVUE-300 IOPAMIDOL (ISOVUE-300) INJECTION 61% COMPARISON:  CT abdomen and pelvis 08/16/2014. FINDINGS: The lung bases are clear.  No pleural or pericardial effusion. The patient is status post cholecystectomy. Mild intra and extrahepatic biliary ductal prominence is unchanged. No focal liver lesion. The spleen, adrenal glands and pancreas appear normal. Small low attenuating lesions in the kidneys are most compatible with cysts. The kidneys are otherwise unremarkable. Aortoiliac atherosclerosis without aneurysm is identified. The patient is status post hysterectomy. There is sigmoid diverticulosis without diverticulitis. The colon is largely decompressed. The stomach, small bowel and appendix appear normal. No lymphadenopathy or fluid. No focal bony abnormality is seen. The patient is status post right hip replacement. IMPRESSION: No acute abnormality abdomen or pelvis. No finding to explain the patient's symptoms. Status post cholecystectomy and hysterectomy. Atherosclerosis. Sigmoid diverticulosis without diverticulitis. Electronically Signed   By: Inge Rise M.D.   On: 06/09/2015 07:39      Clayton Bibles, PA-C 06/09/15 1156

## 2015-06-09 NOTE — ED Notes (Signed)
Patient has had nausea and vomiting x 2 hours. Patient has had diarrhea x 3. Patient is also complaining of lower abdominal pain.

## 2015-06-09 NOTE — ED Notes (Signed)
Bed: WA24 Expected date:  Expected time:  Means of arrival:  Comments: EMS 

## 2015-06-09 NOTE — Discharge Instructions (Signed)
Read the information below.  Use the prescribed medication as directed.  Please discuss all new medications with your pharmacist.  You may return to the Emergency Department at any time for worsening condition or any new symptoms that concern you.  If you develop high fevers, worsening abdominal pain, uncontrolled vomiting, or are unable to tolerate fluids by mouth, return to the ER for a recheck.     Abdominal Pain, Adult Many things can cause abdominal pain. Usually, abdominal pain is not caused by a disease and will improve without treatment. It can often be observed and treated at home. Your health care provider will do a physical exam and possibly order blood tests and X-rays to help determine the seriousness of your pain. However, in many cases, more time must pass before a clear cause of the pain can be found. Before that point, your health care provider may not know if you need more testing or further treatment. HOME CARE INSTRUCTIONS Monitor your abdominal pain for any changes. The following actions may help to alleviate any discomfort you are experiencing:  Only take over-the-counter or prescription medicines as directed by your health care provider.  Do not take laxatives unless directed to do so by your health care provider.  Try a clear liquid diet (broth, tea, or water) as directed by your health care provider. Slowly move to a bland diet as tolerated. SEEK MEDICAL CARE IF:  You have unexplained abdominal pain.  You have abdominal pain associated with nausea or diarrhea.  You have pain when you urinate or have a bowel movement.  You experience abdominal pain that wakes you in the night.  You have abdominal pain that is worsened or improved by eating food.  You have abdominal pain that is worsened with eating fatty foods.  You have a fever. SEEK IMMEDIATE MEDICAL CARE IF:  Your pain does not go away within 2 hours.  You keep throwing up (vomiting).  Your pain is felt  only in portions of the abdomen, such as the right side or the left lower portion of the abdomen.  You pass bloody or black tarry stools. MAKE SURE YOU:  Understand these instructions.  Will watch your condition.  Will get help right away if you are not doing well or get worse.   This information is not intended to replace advice given to you by your health care provider. Make sure you discuss any questions you have with your health care provider.   Document Released: 12/14/2004 Document Revised: 11/25/2014 Document Reviewed: 11/13/2012 Elsevier Interactive Patient Education [employer].

## 2015-06-24 ENCOUNTER — Ambulatory Visit (INDEPENDENT_AMBULATORY_CARE_PROVIDER_SITE_OTHER): Payer: Medicare Other | Admitting: Neurology

## 2015-06-24 ENCOUNTER — Encounter: Payer: Self-pay | Admitting: Neurology

## 2015-06-24 ENCOUNTER — Other Ambulatory Visit (INDEPENDENT_AMBULATORY_CARE_PROVIDER_SITE_OTHER): Payer: Medicare Other

## 2015-06-24 VITALS — BP 126/74 | HR 85 | Ht 65.0 in | Wt 168.0 lb

## 2015-06-24 DIAGNOSIS — G44229 Chronic tension-type headache, not intractable: Secondary | ICD-10-CM

## 2015-06-24 LAB — SEDIMENTATION RATE: Sed Rate: 27 mm/hr — ABNORMAL HIGH (ref 0–22)

## 2015-06-24 MED ORDER — TOPIRAMATE 25 MG PO TABS: 25.0000 mg | ORAL_TABLET | Freq: Every day | ORAL | Status: DC

## 2015-06-24 MED ORDER — NAPROXEN 500 MG PO TABS
500.0000 mg | ORAL_TABLET | Freq: Two times a day (BID) | ORAL | Status: DC | PRN
Start: 2015-06-24 — End: 2015-10-12

## 2015-06-24 NOTE — Patient Instructions (Addendum)
1.  We will start topiramate 25mg  at bedtime.    Possible side effects include: impaired thinking, sedation, paresthesias (numbness and tingling) and weight loss.  It may cause dehydration and there is a small risk for kidney stones, so make sure to stay hydrated with water during the day.  There is also a very small risk for glaucoma, so if you notice any change in your vision while taking this medication, see an ophthalmologist.    CALL IN 4 [place].  2.  Limit use of Tylenol or other pain relievers to no more than 2 days out of the week to prevent rebound headache.  I will prescribe you naproxen 500mg  which you can take every 12 hours as needed (but limit to no more than 2 days out of the week).  3.  Will get MRI of brain with and without contrast 4.  Will check Sed Rate 5.  Follow up in 3 to 4 months but call in 4 weeks with update.

## 2015-06-24 NOTE — Progress Notes (Signed)
NEUROLOGY CONSULTATION NOTE  Maria Schneider MRN: [id number] DOB: 01-Nov-1945  Referring provider: Dr. Sarajane Jews Primary care provider: Dr. Sarajane Jews  Reason for consult:  headache  HISTORY OF PRESENT ILLNESS: Maria Schneider is a 70 year old right-handed female who presents for headache.  History obtained by patient and PCP note.  Imaging of head CT reviewed.  Onset:  Early January.  No preceding event.  At first, it was believed to be sinusitis and she was treated with 3 rounds of antibiotics. Location:  Bi-frontal/temporal and top of head Quality:  Non-throbbing, squeezing Intensity:  Starts as dull headache in morning and gradually increases to 6-7/10 Aura:  no Prodrome:  no Associated symptoms:  Photophobia.  No nausea, phonophobia or visual disturbance. Duration:  Wakes up with it and lasts until evening. Frequency:  3 to 4 days per week (once a week severe) Triggers/exacerbating factors:  unknown Relieving factors:  unknown Activity:  Cannot function if severe  CT Head from 05/24/15 was unremarkable.  Past NSAIDS:  diclofenac  Takes pain relievers frequently Current NSAIDS:  Aleve 220mg  Current analgesics:  Extra-strength Tylenol Current Antihypertensive medications:  Toprol Current Antidepressant medications:  Wellbutrin XL 300mg ?  Zoloft 200mg ?  Caffeine:  decaff Alcohol:  rarely Smoker:  no Diet:  Does not drink enough water Exercise:  Not routine since hip surgery in October Depression/stress:  stable Sleep hygiene:  good Family history of headache:  no  PAST MEDICAL HISTORY: Past Medical History  Diagnosis Date  . Hyperlipidemia   . Hypertension   . Vitamin D deficiency   . Depression   . Allergy   . Asthma   . Urinary incontinence   . GERD (gastroesophageal reflux disease)   . Detrusor instability   . Osteopenia   . Arthritis   . History of skin cancer   . Diverticulosis     not symptomatic  . History of kidney stones   . Cancer ([place])     hx skin cancer   . History of pancreatitis     April 2015    PAST SURGICAL HISTORY: Past Surgical History  Procedure Laterality Date  . Breast biopsy  1972    benign  . Colonoscopy  06-24-12    per Dr. Earlean Shawl, benign polyps, repeat in 5 yrs    . Tubal ligation    . Tonsillectomy    . Abdominal hysterectomy  1994    TAH/ BLADDER SUSPENSION  . Cholecystectomy N/A 07/12/2013    Procedure: LAPAROSCOPIC CHOLECYSTECTOMY WITH INTRAOPERATIVE CHOLANGIOGRAM;  Surgeon: Edward Jolly, MD;  Location: [facility];  Service: General;  Laterality: N/A;  . Ercp N/A 07/14/2013    Procedure: ENDOSCOPIC RETROGRADE CHOLANGIOPANCREATOGRAPHY (ERCP);  Surgeon: Gatha Mayer, MD;  Location: [facility];  Service: Endoscopy;  Laterality: N/A;  . Total hip arthroplasty Right 01/13/2015    Procedure: RIGHT TOTAL HIP ARTHROPLASTY ANTERIOR APPROACH;  Surgeon: Gaynelle Arabian, MD;  Location: [facility];  Service: Orthopedics;  Laterality: Right;    MEDICATIONS: Current Outpatient Prescriptions on File Prior to Visit  Medication Sig Dispense Refill  . Aspirin-Calcium Carbonate 81-300 MG TABS Take 1 tablet by mouth daily. Reported on 03/05/2015    . buPROPion (WELLBUTRIN XL) 300 MG 24 hr tablet Take 300 mg by mouth daily.      . Cholecalciferol (VITAMIN D PO) Take 3,000 Units by mouth daily. Reported on 06/09/2015    . esomeprazole (NEXIUM) 20 MG capsule Take 20 mg by mouth daily.     [name] ezetimibe (ZETIA)  10 MG tablet Take 1 tablet (10 mg total) by mouth daily. 90 tablet 3  . fenofibrate 160 MG tablet TAKE 1 TABLET BY MOUTH EVERY DAY (Patient taking differently: TAKE 160  MG BY MOUTH EVERY DAY) 90 tablet 2  . metoprolol succinate (TOPROL-XL) 25 MG 24 hr tablet Take 1 tablet (25 mg total) by mouth daily. 90 tablet 3  . ondansetron (ZOFRAN) 4 MG tablet Take 1 tablet (4 mg total) by mouth every 8 (eight) hours as needed for nausea or vomiting. 15 tablet 0  . sertraline (ZOLOFT) 100 MG tablet Take 200 mg by mouth daily.     . [DISCONTINUED]  DETROL LA 4 MG 24 hr capsule TAKE ONE CAPSULE BY MOUTH EVERY DAY 30 capsule 12   No current facility-administered medications on file prior to visit.    ALLERGIES: Allergies  Allergen Reactions  . Ace Inhibitors     REACTION: cough  . Codeine Nausea And Vomiting  . Fentanyl Itching  . Statins     REACTION: MYALGIAS-muscle pain    FAMILY HISTORY: Family History  Problem Relation Age of Onset  . Hypertension Mother   . Osteoporosis Mother   . Heart disease Father   . Osteoporosis Sister   . Breast cancer Paternal Aunt     Age 95's  . Diabetes Paternal Uncle     SOCIAL HISTORY: Social History   Social History  . Marital Status: Married    Spouse Name: N/A  . Number of Children: N/A  . Years of Education: N/A   Occupational History  . Not on file.   Social History Main Topics  . Smoking status: Former Smoker -- 42 years    Types: Cigarettes    Quit date: 07/09/2008  . Smokeless tobacco: Never Used     Comment: quit 6 months ago  . Alcohol Use: 1.2 oz/week    2 Glasses of wine, 0 Standard drinks or equivalent per week     Comment: occ  . Drug Use: No  . Sexual Activity: No   Other Topics Concern  . Not on file   Social History Narrative   Gets regular exercise          REVIEW OF SYSTEMS: Constitutional: No fevers, chills, or sweats, no generalized fatigue, change in appetite Eyes: No visual changes, double vision, eye pain Ear, nose and throat: No hearing loss, ear pain, nasal congestion, sore throat Cardiovascular: No chest pain, palpitations Respiratory:  No shortness of breath at rest or with exertion, wheezes GastrointestinaI: No nausea, vomiting, diarrhea, abdominal pain, fecal incontinence Genitourinary:  No dysuria, urinary retention or frequency Musculoskeletal:  No neck pain, back pain Integumentary: No rash, pruritus, skin lesions Neurological: as above Psychiatric: No depression, insomnia, anxiety Endocrine: No palpitations, fatigue,  diaphoresis, mood swings, change in appetite, change in weight, increased thirst Hematologic/Lymphatic:  No anemia, purpura, petechiae. Allergic/Immunologic: no itchy/runny eyes, nasal congestion, recent allergic reactions, rashes  PHYSICAL EXAM: Filed Vitals:   06/24/15 1027  BP: 126/74  Pulse: 85   General: No acute distress.  Patient appears well-groomed.  Head:  Normocephalic/atraumatic Eyes:  fundi unremarkable, without vessel changes, exudates, hemorrhages or papilledema. Neck: supple, no paraspinal tenderness, full range of motion Back: No paraspinal tenderness Heart: regular rate and rhythm Lungs: Clear to auscultation bilaterally. Vascular: No carotid bruits. Neurological Exam: Mental status: alert and oriented to person, place, and time, recent and remote memory intact, fund of knowledge intact, attention and concentration intact, speech fluent and not dysarthric, language  intact. Cranial nerves: CN I: not tested CN II: pupils equal, round and reactive to light, visual fields intact, fundi unremarkable, without vessel changes, exudates, hemorrhages or papilledema. CN III, IV, VI:  full range of motion, no nystagmus, no ptosis CN V: facial sensation intact CN VII: upper and lower face symmetric CN VIII: hearing intact CN IX, X: gag intact, uvula midline CN XI: sternocleidomastoid and trapezius muscles intact CN XII: tongue midline Bulk & Tone: normal, no fasciculations. Motor:  5/5 throughout Sensation: temperature and vibration sensation intact. Deep Tendon Reflexes:  2+ throughout, toes downgoing.  Finger to nose testing:  Without dysmetria.  Heel to shin:  Without dysmetria.  Gait:  Normal station and stride.  Able to turn and tandem walk. Romberg negative.  IMPRESSION: Possibly chronic tension type headache, complicated by medication overuse  PLAN: 1.  Since these are new persistent headaches, will get MRI of brain with and without contrast  2.  Check Sed  Rate 3.  Will start topiramate 25mg  at bedtime.  She is already on two antidepressants and already is taking a beta blocker.  She had kidney stone only once, so I think it should be okay and she will monitor for any symptoms. 4.  Will try higher dose of naproxen (500mg ) or Tylenol Extra-strength.  Limit to no more than 2 days out of the week. 5.  Follow up in 3 to 4 months.  She is to contact me in 4 weeks with update.  Thank you for allowing me to take part in the care of this patient.  Metta Clines, DO  CC:  [name], MD

## 2015-07-17 ENCOUNTER — Ambulatory Visit
Admission: RE | Admit: 2015-07-17 | Discharge: 2015-07-17 | Disposition: A | Discharge status: Home / Self Care | Payer: Medicare Other | Source: Ambulatory Visit | Attending: Neurology | Admitting: Neurology

## 2015-07-17 DIAGNOSIS — G44229 Chronic tension-type headache, not intractable: Secondary | ICD-10-CM

## 2015-07-17 MED ORDER — GADOBENATE DIMEGLUMINE 529 MG/ML IV SOLN
15.0000 mL | Freq: Once | INTRAVENOUS | Status: AC | PRN
  Administered 2015-07-17: 15 mL via INTRAVENOUS

## 2015-07-19 ENCOUNTER — Telehealth: Payer: Self-pay

## 2015-07-19 NOTE — Telephone Encounter (Signed)
Message relayed Pt have viewed on mychart. Has a couple questions about, "Minimal chronic small vessel ischemic disease".  Pt would like to know if there is anything she should be doing to help reduce the small vessel disease, what are the causes? Pt states her father died of vascular issues in the heart, do you think she should have further testing/be seen by cardiologist? (Had stress test about 5 years ago) Please advise.

## 2015-07-19 NOTE — Telephone Encounter (Signed)
Message was left on pt's voicemail, with instructions to call back with any questions or concerns in relation to message.

## 2015-07-19 NOTE — Telephone Encounter (Signed)
-----   Message from Pieter Partridge, DO sent at 07/18/2015 12:40 PM EDT ----- MRI of brain looks okay

## 2015-07-19 NOTE — Telephone Encounter (Signed)
She doesn't need to see cardiology for this.  The small vessel disease is very mild.  Small vessel disease may be seen in people with history of high blood pressure and high cholesterol.  The main thing would be to control these factors.  Routine exercise and Mediterranean diet are also recommended.

## 2015-07-20 ENCOUNTER — Other Ambulatory Visit: Payer: Self-pay | Admitting: Family Medicine

## 2015-07-20 LAB — HM MAMMOGRAPHY

## 2015-07-20 NOTE — Telephone Encounter (Signed)
I don't see where we have filled this in awhile?

## 2015-07-23 ENCOUNTER — Encounter: Payer: Self-pay | Admitting: Family Medicine

## 2015-10-12 ENCOUNTER — Ambulatory Visit (INDEPENDENT_AMBULATORY_CARE_PROVIDER_SITE_OTHER): Payer: Medicare Other | Admitting: Neurology

## 2015-10-12 ENCOUNTER — Encounter: Payer: Self-pay | Admitting: Neurology

## 2015-10-12 VITALS — BP 120/68 | HR 63 | Ht 65.0 in | Wt 168.0 lb

## 2015-10-12 DIAGNOSIS — G44219 Episodic tension-type headache, not intractable: Secondary | ICD-10-CM | POA: Diagnosis not present

## 2015-10-12 MED ORDER — TOPIRAMATE 25 MG PO TABS: 25.0000 mg | ORAL_TABLET | Freq: Every day | ORAL | 1 refills | Status: DC

## 2015-10-12 MED ORDER — NAPROXEN 500 MG PO TABS: 500.0000 mg | ORAL_TABLET | Freq: Two times a day (BID) | ORAL | 5 refills | Status: DC | PRN

## 2015-10-12 NOTE — Progress Notes (Signed)
NEUROLOGY FOLLOW UP OFFICE NOTE  LIBERTEE BENDICK [id number]  HISTORY OF PRESENT ILLNESS: Maria Schneider is a 70 year old right-handed female who follows up for headache.  UPDATE: She saw ophthalmology on 07/15/15, which revealed early cataracts and retinal drusen, but nothing significant.  MRI of brain with and without contrast on 07/17/15 was personally reviewed and was unremarkable.  Headaches are improved.  She is no longer over-using pain relievers. Intensity:  6/10 without naproxen, 1-2/10 with naproxen Duration:  brief Frequency:  Once a week Current NSAIDS:  naproxen 500mg  Current analgesics:  no Current triptans:  no Current anti-emetic:  Zofran 4mg   Current muscle relaxants:  no Current anti-anxiolytic:  no Current sleep aide:  no Current Antihypertensive medications:  Toprol-XL 25mg  Current Antidepressant medications:  sertraline 100mg , Wellbutrin Current Anticonvulsant medications:  topiramate 25mg  Current Vitamins/Herbal/Supplements:  no Current Antihistamines/Decongestants:  no Other therapy:  no   HISTORY: Onset:  Early January.  No preceding event.  At first, it was believed to be sinusitis and she was treated with 3 rounds of antibiotics. Location:  Bi-frontal/temporal and top of head Quality:  Non-throbbing, squeezing Initial Intensity:  Starts as dull headache in morning and gradually increases to 6-7/10 Aura:  no Prodrome:  no Associated symptoms:  Photophobia.  No nausea, phonophobia or visual disturbance. Initial Duration:  Wakes up with it and lasts until evening. Initial Frequency:  3 to 4 days per week (once a week severe) Triggers/exacerbating factors:  unknown Relieving factors:  unknown Activity:  Cannot function if severe  CT Head from 05/24/15 was unremarkable.  Past NSAIDS:  diclofenac, extra-strength Tylenol  Caffeine:  decaff Alcohol:  rarely Smoker:  no Diet:  Does not drink enough water Exercise:  Not routine since hip surgery in  October Depression/stress:  stable Sleep hygiene:  good Family history of headache:  no  PAST MEDICAL HISTORY: Past Medical History:  Diagnosis Date  . Allergy   . Arthritis   . Asthma   . Cancer ([place])    hx skin cancer  . Depression   . Detrusor instability   . Diverticulosis    not symptomatic  . GERD (gastroesophageal reflux disease)   . History of kidney stones   . History of pancreatitis    April 2015  . History of skin cancer   . Hyperlipidemia   . Hypertension   . Osteopenia   . Urinary incontinence   . Vitamin D deficiency     MEDICATIONS: Current Outpatient Prescriptions on File Prior to Visit  Medication Sig Dispense Refill  . Aspirin-Calcium Carbonate 81-300 MG TABS Take 1 tablet by mouth daily. Reported on 03/05/2015    . buPROPion (WELLBUTRIN XL) 300 MG 24 hr tablet Take 300 mg by mouth daily.      . Cholecalciferol (VITAMIN D PO) Take 3,000 Units by mouth daily. Reported on 06/09/2015    . esomeprazole (NEXIUM) 20 MG capsule Take 20 mg by mouth daily.     [name] ezetimibe (ZETIA) 10 MG tablet Take 1 tablet (10 mg total) by mouth daily. 90 tablet 3  . fenofibrate 160 MG tablet TAKE 1 TABLET BY MOUTH EVERY DAY (Patient taking differently: TAKE 160  MG BY MOUTH EVERY DAY) 90 tablet 2  . metoprolol succinate (TOPROL-XL) 25 MG 24 hr tablet TAKE 1 TABLET (25 MG TOTAL) BY MOUTH DAILY. 90 tablet 3  . ondansetron (ZOFRAN) 4 MG tablet Take 1 tablet (4 mg total) by mouth every 8 (eight) hours as needed for nausea  or vomiting. 15 tablet 0  . sertraline (ZOLOFT) 100 MG tablet Take 200 mg by mouth daily.     . [DISCONTINUED] DETROL LA 4 MG 24 hr capsule TAKE ONE CAPSULE BY MOUTH EVERY DAY 30 capsule 12   No current facility-administered medications on file prior to visit.     ALLERGIES: Allergies  Allergen Reactions  . Ace Inhibitors     REACTION: cough  . Codeine Nausea And Vomiting  . Fentanyl Itching  . Statins     REACTION: MYALGIAS-muscle pain    FAMILY  HISTORY: Family History  Problem Relation Age of Onset  . Hypertension Mother   . Osteoporosis Mother   . Heart disease Father   . Osteoporosis Sister   . Breast cancer Paternal Aunt     Age 12's  . Diabetes Paternal Uncle     SOCIAL HISTORY: Social History   Social History  . Marital status: Married    Spouse name: N/A  . Number of children: N/A  . Years of education: N/A   Occupational History  . Not on file.   Social History Main Topics  . Smoking status: Former Smoker    Years: 42.00    Types: Cigarettes    Quit date: 07/09/2008  . Smokeless tobacco: Never Used     Comment: quit 6 months ago  . Alcohol use 1.2 oz/week    2 Glasses of wine per week     Comment: occ  . Drug use: No  . Sexual activity: No   Other Topics Concern  . Not on file   Social History Narrative   Gets regular exercise          REVIEW OF SYSTEMS: Constitutional: No fevers, chills, or sweats, no generalized fatigue, change in appetite Eyes: No visual changes, double vision, eye pain Ear, nose and throat: No hearing loss, ear pain, nasal congestion, sore throat Cardiovascular: No chest pain, palpitations Respiratory:  No shortness of breath at rest or with exertion, wheezes GastrointestinaI: No nausea, vomiting, diarrhea, abdominal pain, fecal incontinence Genitourinary:  No dysuria, urinary retention or frequency Musculoskeletal:  No neck pain, back pain Integumentary: No rash, pruritus, skin lesions Neurological: as above Psychiatric: No depression, insomnia, anxiety Endocrine: No palpitations, fatigue, diaphoresis, mood swings, change in appetite, change in weight, increased thirst Hematologic/Lymphatic:  No purpura, petechiae. Allergic/Immunologic: no itchy/runny eyes, nasal congestion, recent allergic reactions, rashes  PHYSICAL EXAM: Vitals:   10/12/15 1123  BP: 120/68  Pulse: 63   General: No acute distress.  Patient appears well-groomed.  normal body habitus. Head:   Normocephalic/atraumatic Eyes:  Fundi examined but not visualized Neck: supple, no paraspinal tenderness, full range of motion Heart:  Regular rate and rhythm Lungs:  Clear to auscultation bilaterally Back: No paraspinal tenderness Neurological Exam: alert and oriented to person, place, and time. Attention span and concentration intact, recent and remote memory intact, fund of knowledge intact.  Speech fluent and not dysarthric, language intact.  CN II-XII intact. Bulk and tone normal, muscle strength 5/5 throughout.  Sensation to light touch ntact.  Deep tendon reflexes 2+ throughout  Finger to nose and heel to shin testing intact.  Gait normal, Romberg negative.  IMPRESSION: Tension-type headache, improved  PLAN: 1.  Topamax 25mg  at bedtime 2.  Naproxen 500mg  as needed, limited to no more than 2 days out of the week. 3.  Follow up in 6 months.  15 minutes spent face to face with patient, over 50% spent discussing management.  Metta Clines,  DO  CC:  [name], MD

## 2015-10-12 NOTE — Patient Instructions (Signed)
1.  Continue topiramate 25mg  at bedtime 2.  When you get a headache, take naproxen 500mg  at earliest onset.  May repeat dose in 12 hours if needed.  Do not take more than 2 days out of the week. 3.  Follow up in 6 months.

## 2015-12-27 ENCOUNTER — Telehealth: Payer: Self-pay | Admitting: Family Medicine

## 2015-12-27 NOTE — Telephone Encounter (Signed)
Pt thinks she has uti and needs a referral to see dr Reymundo Poll NP [phone] [facility]. Pt has bcbs hmo. Willette Cluster is aware referral will not be done today fax [phone]

## 2015-12-28 NOTE — Telephone Encounter (Signed)
I need more information before we can do any referrals. What are her symptoms? Why does she need a referral for a UTI? Who is this person she wants to see and where do they work?

## 2015-12-29 ENCOUNTER — Telehealth: Payer: Self-pay | Admitting: Family Medicine

## 2015-12-29 NOTE — Telephone Encounter (Signed)
I spoke with pt and she has already started on medication for UTI, she also has a upcoming appointment with Urology for recurring UTI's.

## 2015-12-29 NOTE — Telephone Encounter (Signed)
Pt is returning sylvia call °

## 2015-12-29 NOTE — Telephone Encounter (Signed)
I left a voice message with below information and sent pt a my chart message. 

## 2015-12-29 NOTE — Telephone Encounter (Signed)
This is a duplicate, see previous note.  

## 2016-01-20 ENCOUNTER — Ambulatory Visit (INDEPENDENT_AMBULATORY_CARE_PROVIDER_SITE_OTHER): Payer: Medicare Other

## 2016-01-20 ENCOUNTER — Other Ambulatory Visit: Payer: Self-pay | Admitting: Family Medicine

## 2016-01-20 DIAGNOSIS — Z23 Encounter for immunization: Secondary | ICD-10-CM

## 2016-02-18 ENCOUNTER — Other Ambulatory Visit (INDEPENDENT_AMBULATORY_CARE_PROVIDER_SITE_OTHER): Payer: Medicare Other

## 2016-02-18 DIAGNOSIS — Z0000 Encounter for general adult medical examination without abnormal findings: Secondary | ICD-10-CM | POA: Diagnosis not present

## 2016-02-18 LAB — CBC WITH DIFFERENTIAL/PLATELET
BASOS ABS: 0 10*3/uL (ref 0.0–0.1)
Basophils Relative: 0.5 % (ref 0.0–3.0)
EOS ABS: 0.1 10*3/uL (ref 0.0–0.7)
Eosinophils Relative: 2.7 % (ref 0.0–5.0)
HCT: 33.8 % — ABNORMAL LOW (ref 36.0–46.0)
Hemoglobin: 11.3 g/dL — ABNORMAL LOW (ref 12.0–15.0)
LYMPHS ABS: 0.7 10*3/uL (ref 0.7–4.0)
Lymphocytes Relative: 17.8 % (ref 12.0–46.0)
MCHC: 33.5 g/dL (ref 30.0–36.0)
MCV: 83.6 fl (ref 78.0–100.0)
MONOS PCT: 9.3 % (ref 3.0–12.0)
Monocytes Absolute: 0.4 10*3/uL (ref 0.1–1.0)
NEUTROS ABS: 2.7 10*3/uL (ref 1.4–7.7)
NEUTROS PCT: 69.7 % (ref 43.0–77.0)
PLATELETS: 198 10*3/uL (ref 150.0–400.0)
RBC: 4.04 Mil/uL (ref 3.87–5.11)
RDW: 13.5 % (ref 11.5–15.5)
WBC: 3.9 10*3/uL — ABNORMAL LOW (ref 4.0–10.5)

## 2016-02-18 LAB — POC URINALSYSI DIPSTICK (AUTOMATED)
BILIRUBIN UA: NEGATIVE
GLUCOSE UA: NEGATIVE
KETONES UA: NEGATIVE
NITRITE UA: NEGATIVE
PH UA: 7.5
Protein, UA: NEGATIVE
RBC UA: NEGATIVE
SPEC GRAV UA: 1.015
Urobilinogen, UA: 1

## 2016-02-18 LAB — BASIC METABOLIC PANEL
BUN: 26 mg/dL — ABNORMAL HIGH (ref 6–23)
CALCIUM: 9.3 mg/dL (ref 8.4–10.5)
CO2: 24 mEq/L (ref 19–32)
CREATININE: 1.33 mg/dL — AB (ref 0.40–1.20)
Chloride: 109 mEq/L (ref 96–112)
GFR: 42.13 mL/min — AB (ref >60.00)
GLUCOSE: 89 mg/dL (ref 70–99)
Potassium: 4.1 mEq/L (ref 3.5–5.1)
SODIUM: 142 meq/L (ref 135–145)

## 2016-02-18 LAB — LIPID PANEL
CHOL/HDL RATIO: 3
Cholesterol: 178 mg/dL (ref 0–200)
HDL: 61.8 mg/dL (ref >39.00)
LDL CALC: 88 mg/dL (ref 0–99)
NONHDL: 116.36
Triglycerides: 141 mg/dL (ref 0.0–149.0)
VLDL: 28.2 mg/dL (ref 0.0–40.0)

## 2016-02-18 LAB — HEPATIC FUNCTION PANEL
ALBUMIN: 4.3 g/dL (ref 3.5–5.2)
ALK PHOS: 42 U/L (ref 39–117)
ALT: 15 U/L (ref 0–35)
AST: 18 U/L (ref 0–37)
BILIRUBIN DIRECT: 0.1 mg/dL (ref 0.0–0.3)
BILIRUBIN TOTAL: 0.4 mg/dL (ref 0.2–1.2)
Total Protein: 6.7 g/dL (ref 6.0–8.3)

## 2016-02-18 LAB — TSH: TSH: 1.17 u[IU]/mL (ref 0.35–4.50)

## 2016-02-20 ENCOUNTER — Other Ambulatory Visit: Payer: Self-pay | Admitting: Family Medicine

## 2016-02-24 ENCOUNTER — Ambulatory Visit (INDEPENDENT_AMBULATORY_CARE_PROVIDER_SITE_OTHER): Payer: Medicare Other | Admitting: Family Medicine

## 2016-02-24 ENCOUNTER — Encounter: Payer: Self-pay | Admitting: Family Medicine

## 2016-02-24 VITALS — BP 145/91 | HR 60 | Temp 98.0°F | Ht 65.0 in | Wt 165.0 lb

## 2016-02-24 DIAGNOSIS — Z0000 Encounter for general adult medical examination without abnormal findings: Secondary | ICD-10-CM

## 2016-02-24 DIAGNOSIS — Z23 Encounter for immunization: Secondary | ICD-10-CM

## 2016-02-24 MED ORDER — EZETIMIBE 10 MG PO TABS: 10.0000 mg | ORAL_TABLET | Freq: Every day | ORAL | 3 refills | Status: DC

## 2016-02-24 MED ORDER — LOSARTAN POTASSIUM 50 MG PO TABS: 50.0000 mg | ORAL_TABLET | Freq: Every day | ORAL | 3 refills | Status: DC

## 2016-02-24 NOTE — Progress Notes (Signed)
Pre visit review using our clinic review tool, if applicable. No additional management support is needed unless otherwise documented below in the visit note. 

## 2016-02-24 NOTE — Progress Notes (Signed)
   Subjective:    Patient ID: Maria Schneider, female    DOB: [birth year], 70 y.o.   MRN: [id number]  HPI 70 yr old female for a well exam. She feels great and hs no complaints other than here chronic bladder issues. She saw her Urologist last week about this. She was recently started on Myrbetriq.    Review of Systems  Constitutional: Negative.   HENT: Negative.   Eyes: Negative.   Respiratory: Negative.   Cardiovascular: Negative.   Gastrointestinal: Negative.   Genitourinary: Negative for decreased urine volume, difficulty urinating, dyspareunia, dysuria, enuresis, flank pain, frequency, hematuria, pelvic pain and urgency.  Musculoskeletal: Negative.   Skin: Negative.   Neurological: Negative.   Psychiatric/Behavioral: Negative.        Objective:   Physical Exam  Constitutional: She is oriented to person, place, and time. She appears well-developed and well-nourished. No distress.  HENT:  Head: Normocephalic and atraumatic.  Right Ear: External ear normal.  Left Ear: External ear normal.  Nose: Nose normal.  Mouth/Throat: Oropharynx is clear and moist. No oropharyngeal exudate.  Eyes: Conjunctivae and EOM are normal. Pupils are equal, round, and reactive to light. No scleral icterus.  Neck: Normal range of motion. Neck supple. No JVD present. No thyromegaly present.  Cardiovascular: Normal rate, regular rhythm, normal heart sounds and intact distal pulses.  Exam reveals no gallop and no friction rub.   No murmur heard. Pulmonary/Chest: Effort normal and breath sounds normal. No respiratory distress. She has no wheezes. She has no rales. She exhibits no tenderness.  Abdominal: Soft. Bowel sounds are normal. She exhibits no distension and no mass. There is no tenderness. There is no rebound and no guarding.  Musculoskeletal: Normal range of motion. She exhibits no edema or tenderness.  Lymphadenopathy:    She has no cervical adenopathy.  Neurological: She is alert and oriented  to person, place, and time. She has normal reflexes. No cranial nerve deficit. She exhibits normal muscle tone. Coordination normal.  Skin: Skin is warm and dry. No rash noted. No erythema.  Psychiatric: She has a normal mood and affect. Her behavior is normal. Judgment and thought content normal.          Assessment & Plan:  Well exam. We discussed diet and exercise. Her BP has been averging 140s to 150s over 90s recently so we will add Losartan 50 mg daily to her Metoprolol. Recheck in 3-4 weeks.  Laurey Morale, MD

## 2016-04-17 ENCOUNTER — Encounter: Payer: Self-pay | Admitting: Neurology

## 2016-04-17 ENCOUNTER — Ambulatory Visit (INDEPENDENT_AMBULATORY_CARE_PROVIDER_SITE_OTHER): Payer: Medicare Other | Admitting: Neurology

## 2016-04-17 VITALS — BP 110/80 | HR 64 | Ht 65.0 in | Wt 166.4 lb

## 2016-04-17 DIAGNOSIS — G44219 Episodic tension-type headache, not intractable: Secondary | ICD-10-CM | POA: Diagnosis not present

## 2016-04-17 NOTE — Progress Notes (Signed)
NEUROLOGY FOLLOW UP OFFICE NOTE  AARTI SWIDERSKI UL:[id number]  HISTORY OF PRESENT ILLNESS: Maria Schneider is a 71 year old right-handed female who follows up for headache.   UPDATE: She has not had any recurrent headache over the past 6 months. Duration:  brief Frequency:  Once a week Current NSAIDS:  naproxen 500mg  (not needed) Current analgesics:  no Current triptans:  no Current anti-emetic:  Zofran 4mg   Current muscle relaxants:  no Current anti-anxiolytic:  no Current sleep aide:  no Current Antihypertensive medications:  Toprol-XL 25mg , losartan Current Antidepressant medications:  sertraline 100mg , Wellbutrin Current Anticonvulsant medications:  topiramate 25mg  Current Vitamins/Herbal/Supplements:  no Current Antihistamines/Decongestants:  no Other therapy:  no  Caffeine:  decaff Alcohol:  rarely Smoker:  no Diet:  Does not drink enough water Exercise:  Not routine since hip surgery in October Depression/stress:  stable Sleep hygiene:  good   HISTORY: Onset:  Early January.  No preceding event.  At first, it was believed to be sinusitis and she was treated with 3 rounds of antibiotics. Location:  Bi-frontal/temporal and top of head Quality:  Non-throbbing, squeezing Initial Intensity:  Starts as dull headache in morning and gradually increases to 6-7/10 Aura:  no Prodrome:  no Associated symptoms:  Photophobia.  No nausea, phonophobia or visual disturbance. Initial Duration:  Wakes up with it and lasts until evening. Initial Frequency:  3 to 4 days per week (once a week severe) Triggers/exacerbating factors:  unknown Relieving factors:  unknown Activity:  Cannot function if severe   CT Head from 05/24/15 was unremarkable. MRI of brain with and without contrast on 07/17/15 and was unremarkable. She saw ophthalmology on 07/15/15, which revealed early cataracts and retinal drusen, but nothing significant.   Past NSAIDS:  diclofenac, extra-strength Tylenol     Family history of headache:  no  PAST MEDICAL HISTORY: Past Medical History:  Diagnosis Date  . Allergy   . Arthritis   . Asthma   . Cancer ([place])    hx skin cancer  . Depression   . Detrusor instability   . Diverticulosis    not symptomatic  . GERD (gastroesophageal reflux disease)   . History of kidney stones   . History of pancreatitis    April 2015  . History of skin cancer   . Hyperlipidemia   . Hypertension   . Osteopenia   . Urinary incontinence   . Vitamin D deficiency     MEDICATIONS: Current Outpatient Prescriptions on File Prior to Visit  Medication Sig Dispense Refill  . Aspirin-Calcium Carbonate 81-300 MG TABS Take 1 tablet by mouth daily. Reported on 03/05/2015    . buPROPion (WELLBUTRIN XL) 300 MG 24 hr tablet Take 300 mg by mouth daily.      . Cholecalciferol (VITAMIN D PO) Take 3,000 Units by mouth daily. Reported on 06/09/2015    . esomeprazole (NEXIUM) 20 MG capsule Take 20 mg by mouth daily.     [name] ezetimibe (ZETIA) 10 MG tablet Take 1 tablet (10 mg total) by mouth daily. 90 tablet 3  . fenofibrate 160 MG tablet TAKE 1 TABLET BY MOUTH EVERY DAY 90 tablet 3  . losartan (COZAAR) 50 MG tablet Take 1 tablet (50 mg total) by mouth daily. 30 tablet 3  . metoprolol succinate (TOPROL-XL) 25 MG 24 hr tablet TAKE 1 TABLET (25 MG TOTAL) BY MOUTH DAILY. 90 tablet 3  . naproxen (NAPROSYN) 500 MG tablet Take 1 tablet (500 mg total) by mouth every 12 (twelve)  hours as needed. 20 tablet 5  . ondansetron (ZOFRAN) 4 MG tablet Take 1 tablet (4 mg total) by mouth every 8 (eight) hours as needed for nausea or vomiting. (Patient not taking: Reported on 02/24/2016) 15 tablet 0  . sertraline (ZOLOFT) 100 MG tablet Take 200 mg by mouth daily.     [name] topiramate (TOPAMAX) 25 MG tablet Take 1 tablet (25 mg total) by mouth at bedtime. 90 tablet 1  . [DISCONTINUED] DETROL LA 4 MG 24 hr capsule TAKE ONE CAPSULE BY MOUTH EVERY DAY 30 capsule 12   No current facility-administered  medications on file prior to visit.     ALLERGIES: Allergies  Allergen Reactions  . Ace Inhibitors     REACTION: cough  . Codeine Nausea And Vomiting  . Fentanyl Itching  . Statins     REACTION: MYALGIAS-muscle pain    FAMILY HISTORY: Family History  Problem Relation Age of Onset  . Hypertension Mother   . Osteoporosis Mother   . Heart disease Father   . Osteoporosis Sister   . Breast cancer Paternal Aunt     Age 40's  . Diabetes Paternal Uncle     SOCIAL HISTORY: Social History   Social History  . Marital status: Married    Spouse name: N/A  . Number of children: N/A  . Years of education: N/A   Occupational History  . Not on file.   Social History Main Topics  . Smoking status: Former Smoker    Years: 42.00    Types: Cigarettes    Quit date: 07/09/2008  . Smokeless tobacco: Never Used     Comment: quit 6 months ago  . Alcohol use 1.2 oz/week    2 Glasses of wine per week     Comment: occ  . Drug use: No  . Sexual activity: No   Other Topics Concern  . Not on file   Social History Narrative   Gets regular exercise          REVIEW OF SYSTEMS: Constitutional: No fevers, chills, or sweats, no generalized fatigue, change in appetite Eyes: No visual changes, double vision, eye pain Ear, nose and throat: No hearing loss, ear pain, nasal congestion, sore throat Cardiovascular: No chest pain, palpitations Respiratory:  No shortness of breath at rest or with exertion, wheezes GastrointestinaI: No nausea, vomiting, diarrhea, abdominal pain, fecal incontinence Genitourinary:  No dysuria, urinary retention or frequency Musculoskeletal:  No neck pain, back pain Integumentary: No rash, pruritus, skin lesions Neurological: as above Psychiatric: No depression, insomnia, anxiety Endocrine: No palpitations, fatigue, diaphoresis, mood swings, change in appetite, change in weight, increased thirst Hematologic/Lymphatic:  No purpura,  petechiae. Allergic/Immunologic: no itchy/runny eyes, nasal congestion, recent allergic reactions, rashes  PHYSICAL EXAM: Vitals:   04/17/16 1156  BP: 110/80  Pulse: 64   General: No acute distress.  Patient appears well-groomed.  normal body habitus. Head:  Normocephalic/atraumatic Eyes:  Fundi examined but not visualized Neck: supple, no paraspinal tenderness, full range of motion Heart:  Regular rate and rhythm Lungs:  Clear to auscultation bilaterally Back: No paraspinal tenderness Neurological Exam: alert and oriented to person, place, and time. Attention span and concentration intact, recent and remote memory intact, fund of knowledge intact.  Speech fluent and not dysarthric, language intact.  CN II-XII intact. Bulk and tone normal, muscle strength 5/5 throughout.  Sensation to light touch  intact.  Deep tendon reflexes 2+ throughout.  Finger to nose testing intact.  Gait normal  IMPRESSION: Tension-type headaches,  resolved  PLAN: 1.  Will discontinue topiramate, as she may no longer need it.  If headaches recur, she should contact us and we can restart it.  Otherwise, follow up as needed.  16 minutes spent face to face with patient, over 50% spent discussing discontinuing topiramate.  Metta Clines, DO  CC:  [name], MD

## 2016-05-21 ENCOUNTER — Other Ambulatory Visit: Payer: Self-pay | Admitting: Neurology

## 2016-08-15 ENCOUNTER — Other Ambulatory Visit: Payer: Self-pay | Admitting: Family Medicine

## 2016-08-15 NOTE — Telephone Encounter (Signed)
Can we refill this? Drug allergie box appeared when trying to process.

## 2016-08-31 ENCOUNTER — Other Ambulatory Visit: Payer: Self-pay | Admitting: Family Medicine

## 2016-09-04 ENCOUNTER — Encounter: Payer: Self-pay | Admitting: Family Medicine

## 2016-09-04 ENCOUNTER — Ambulatory Visit (INDEPENDENT_AMBULATORY_CARE_PROVIDER_SITE_OTHER): Payer: Medicare Other | Admitting: Family Medicine

## 2016-09-04 VITALS — BP 127/98 | HR 88 | Temp 98.3°F | Ht 65.0 in | Wt 162.0 lb

## 2016-09-04 DIAGNOSIS — J209 Acute bronchitis, unspecified: Secondary | ICD-10-CM

## 2016-09-04 DIAGNOSIS — I10 Essential (primary) hypertension: Secondary | ICD-10-CM | POA: Diagnosis not present

## 2016-09-04 LAB — BASIC METABOLIC PANEL
BUN: 23 mg/dL (ref 6–23)
CO2: 27 meq/L (ref 19–32)
CREATININE: 1.1 mg/dL (ref 0.40–1.20)
Calcium: 10.3 mg/dL (ref 8.4–10.5)
Chloride: 103 mEq/L (ref 96–112)
GFR: 52.37 mL/min — ABNORMAL LOW (ref >60.00)
GLUCOSE: 87 mg/dL (ref 70–99)
POTASSIUM: 4 meq/L (ref 3.5–5.1)
Sodium: 140 mEq/L (ref 135–145)

## 2016-09-04 MED ORDER — AZITHROMYCIN 250 MG PO TABS: ORAL_TABLET | ORAL | 0 refills | Status: DC

## 2016-09-04 NOTE — Progress Notes (Signed)
   Subjective:    Patient ID: Maria Schneider, female    DOB: 1946-02-15, 71 y.o.   MRN: [id number]  HPI Here for 4 days of chest tightness and a dry cough. On Mucinex.    Review of Systems  Constitutional: Negative.   HENT: Positive for congestion and postnasal drip. Negative for sinus pain, sinus pressure and sore throat.   Eyes: Negative.   Respiratory: Positive for cough and chest tightness.        Objective:   Physical Exam  Constitutional: She appears well-developed and well-nourished.  HENT:  Right Ear: External ear normal.  Left Ear: External ear normal.  Nose: Nose normal.  Mouth/Throat: Oropharynx is clear and moist.  Eyes: Conjunctivae are normal.  Neck: No thyromegaly present.  Pulmonary/Chest: Effort normal. No respiratory distress. She has no wheezes. She has no rales.  Scattered rhonchi   Lymphadenopathy:    She has no cervical adenopathy.          Assessment & Plan:  Bronchitis, treat with a Zpack. We will also check a BMET today since she had a mildly elevated creatinine last December.  [name], MD

## 2016-09-04 NOTE — Patient Instructions (Signed)
WE NOW OFFER   [facility] FAST TRACK!!!  SAME DAY Appointments for ACUTE CARE  Such as: Sprains, Injuries, cuts, abrasions, rashes, muscle pain, joint pain, back pain Colds, flu, sore throats, headache, allergies, cough, fever  Ear pain, sinus and eye infections Abdominal pain, nausea, vomiting, diarrhea, upset stomach Animal/insect bites  3 Easy Ways to Schedule: Walk-In Scheduling Call in scheduling Mychart Sign-up: [email or web address]         

## 2016-09-14 LAB — HM MAMMOGRAPHY

## 2016-09-15 ENCOUNTER — Encounter: Payer: Self-pay | Admitting: Family Medicine

## 2016-09-29 ENCOUNTER — Encounter: Payer: Self-pay | Admitting: Family Medicine

## 2016-09-29 ENCOUNTER — Ambulatory Visit (INDEPENDENT_AMBULATORY_CARE_PROVIDER_SITE_OTHER): Payer: Medicare Other | Admitting: Family Medicine

## 2016-09-29 VITALS — BP 124/88 | HR 63 | Temp 98.0°F | Ht 65.0 in | Wt 161.0 lb

## 2016-09-29 DIAGNOSIS — K219 Gastro-esophageal reflux disease without esophagitis: Secondary | ICD-10-CM

## 2016-09-29 DIAGNOSIS — I10 Essential (primary) hypertension: Secondary | ICD-10-CM

## 2016-09-29 DIAGNOSIS — J312 Chronic pharyngitis: Secondary | ICD-10-CM

## 2016-09-29 NOTE — Progress Notes (Signed)
   Subjective:    Patient ID: Maria Schneider, female    DOB: March 18, 1946, 71 y.o.   MRN: [id number]  HPI Here for 3 weeks of intermittent irritation in the throat, slight soreness in the throat, and a dry tickling cough. These symptoms usually occur at night. No fever. No sinus congestion or PND. She takes a Nexium 20 mg every day in the early mornings.    Review of Systems  Constitutional: Negative.   HENT: Positive for sore throat. Negative for congestion, facial swelling, nosebleeds, postnasal drip, rhinorrhea, sinus pain, sinus pressure, trouble swallowing and voice change.   Eyes: Negative.   Respiratory: Positive for cough. Negative for chest tightness, shortness of breath and wheezing.   Cardiovascular: Negative.   Gastrointestinal: Negative.        Objective:   Physical Exam  Constitutional: She is oriented to person, place, and time. She appears well-developed and well-nourished.  HENT:  Right Ear: External ear normal.  Left Ear: External ear normal.  Nose: Nose normal.  Mouth/Throat: Oropharynx is clear and moist. No oropharyngeal exudate.  Eyes: Conjunctivae are normal.  Neck: Neck supple. No thyromegaly present.  Cardiovascular: Normal rate, regular rhythm, normal heart sounds and intact distal pulses.   Pulmonary/Chest: Effort normal and breath sounds normal.  Abdominal: Soft. Bowel sounds are normal. She exhibits no distension and no mass. There is no tenderness. There is no rebound and no guarding.  Lymphadenopathy:    She has no cervical adenopathy.  Neurological: She is alert and oriented to person, place, and time.          Assessment & Plan:  She may be having some nocturnal reflux which is irritating her upper airways. She will increase the Nexium to BID and recheck prn.  [name], MD

## 2016-09-29 NOTE — Patient Instructions (Signed)
WE NOW OFFER   [facility] FAST TRACK!!!  SAME DAY Appointments for ACUTE CARE  Such as: Sprains, Injuries, cuts, abrasions, rashes, muscle pain, joint pain, back pain Colds, flu, sore throats, headache, allergies, cough, fever  Ear pain, sinus and eye infections Abdominal pain, nausea, vomiting, diarrhea, upset stomach Animal/insect bites  3 Easy Ways to Schedule: Walk-In Scheduling Call in scheduling Mychart Sign-up: [email or web address]         

## 2016-10-17 ENCOUNTER — Encounter: Payer: Self-pay | Admitting: Family Medicine

## 2016-10-17 NOTE — Telephone Encounter (Signed)
I will put in a referral to [facility] for this

## 2016-10-17 NOTE — Addendum Note (Signed)
Addended by: [name] A on: 10/17/2016 05:41 PM   Modules accepted: Orders

## 2016-10-25 NOTE — Telephone Encounter (Signed)
Actually this is probably the quickest place for her to get in. She could call to get on a cancellation list

## 2016-12-07 ENCOUNTER — Encounter: Payer: Self-pay | Admitting: Family Medicine

## 2017-04-13 ENCOUNTER — Ambulatory Visit: Payer: Self-pay

## 2017-04-16 ENCOUNTER — Other Ambulatory Visit: Payer: Self-pay | Admitting: Family Medicine

## 2017-05-12 ENCOUNTER — Ambulatory Visit (HOSPITAL_COMMUNITY)
Admission: EM | Admit: 2017-05-12 | Discharge: 2017-05-12 | Disposition: A | Discharge status: Home / Self Care | Payer: Medicare Other | Attending: Emergency Medicine | Admitting: Emergency Medicine

## 2017-05-12 ENCOUNTER — Ambulatory Visit (INDEPENDENT_AMBULATORY_CARE_PROVIDER_SITE_OTHER): Payer: Medicare Other

## 2017-05-12 ENCOUNTER — Other Ambulatory Visit: Payer: Self-pay

## 2017-05-12 DIAGNOSIS — R05 Cough: Secondary | ICD-10-CM

## 2017-05-12 DIAGNOSIS — R69 Illness, unspecified: Secondary | ICD-10-CM

## 2017-05-12 DIAGNOSIS — J111 Influenza due to unidentified influenza virus with other respiratory manifestations: Secondary | ICD-10-CM

## 2017-05-12 MED ORDER — ACETAMINOPHEN 325 MG PO TABS
ORAL_TABLET | ORAL | Status: AC
  Filled 2017-05-12: qty 3

## 2017-05-12 MED ORDER — ONDANSETRON HCL 4 MG PO TABS: 4.0000 mg | ORAL_TABLET | Freq: Three times a day (TID) | ORAL | 0 refills | Status: DC | PRN

## 2017-05-12 MED ORDER — ONDANSETRON 4 MG PO TBDP
4.0000 mg | ORAL_TABLET | Freq: Once | ORAL | Status: AC
  Administered 2017-05-12: 4 mg via ORAL

## 2017-05-12 MED ORDER — ONDANSETRON 4 MG PO TBDP
ORAL_TABLET | ORAL | Status: AC
  Filled 2017-05-12: qty 1

## 2017-05-12 MED ORDER — ACETAMINOPHEN 325 MG PO TABS
975.0000 mg | ORAL_TABLET | Freq: Once | ORAL | Status: AC
  Administered 2017-05-12: 975 mg via ORAL

## 2017-05-12 MED ORDER — OSELTAMIVIR PHOSPHATE 75 MG PO CAPS: 75.0000 mg | ORAL_CAPSULE | Freq: Two times a day (BID) | ORAL | 0 refills | Status: AC

## 2017-05-12 MED ORDER — BENZONATATE 100 MG PO CAPS: 100.0000 mg | ORAL_CAPSULE | Freq: Three times a day (TID) | ORAL | 0 refills | Status: DC

## 2017-05-12 NOTE — Discharge Instructions (Signed)
Push fluids to ensure adequate hydration and keep secretions thin.  Tylenol and/or ibuprofen as needed for pain or fevers. Ibuprofen or aleve only if have eaten as this may cause upset stomach. Complete course of tamiflu. Zofran as needed for nausea and vomiting. If develop worsening of symptoms, weakness, worsening of cough, shortness of breath, dehydration, or otherwise worsening please go to Er. Please follow up with your primary care provider in the next week for recheck of symptoms.

## 2017-05-12 NOTE — ED Triage Notes (Signed)
Pt presents today with flu like sxs. States she started with a sore throat Wednesday morning, then progressed through the rest of the week with fever, chills, body aches, nausea, and vomiting. Has had a fever as high as 102.2 today. Has not felt like eating and states that she was around her son and grandson on Monday who had the flu.

## 2017-05-12 NOTE — ED Notes (Signed)
Water and Gatorade provided with instruction to wait approx 15 min since having antiemetic.

## 2017-05-12 NOTE — ED Provider Notes (Addendum)
[facility]    CSN: [id number] Arrival date & time: 05/12/17  1447     History   Chief Complaint Chief Complaint  Patient presents with  . Fever    HPI [name] is a 72 y.o. female.   Maria Schneider presents with complaints of fevers, cough, sore throat which started 3 days ago. Today developed nausea and two episodes of vomiting. Had taken naproxen prior to vomiting. Fevers have waxed and waned. Poor sleep due to cough. Denies shortness of breath. Chest pain associated with cough. Cough is occasionally productive. Has been drinking fluids. Urinating. Denies abdominal pain or diarrhea. Minimal nausea currently, states it comes and goes. Denies ear pain. Was at her son's home helping as he and his infant were influenza + earlier this week. She did get her flu vaccine. Denies history of asthma. Quit smoking 8 years ago. Took mucinex which did not help with symptoms. History of depression, gerd, pancreatitis, htn, hyperlipidemia, depression.     ROS per HPI.       Past Medical History:  Diagnosis Date  . Allergy   . Arthritis   . Asthma   . Cancer ([place])    hx skin cancer  . Depression   . Detrusor instability   . Diverticulosis    not symptomatic  . GERD (gastroesophageal reflux disease)   . History of kidney stones   . History of pancreatitis    April 2015  . History of skin cancer   . Hyperlipidemia   . Hypertension   . Osteopenia   . Urinary incontinence   . Vitamin D deficiency     Patient Active Problem List   Diagnosis Date Noted  . OA (osteoarthritis) of hip 01/13/2015  . B12 deficiency 01/07/2014  . Bradycardia 01/07/2014  . Abnormal cholangiogram - CBD stones suspected 07/14/2013  . Gall stone pancreatitis 07/09/2013  . PYELONEPHRITIS, ACUTE 05/22/2009  . COSTOCHONDRITIS 04/28/2009  . HYPERLIPIDEMIA 04/12/2009  . DEPRESSION 04/12/2009  . Essential hypertension 04/12/2009  . ALLERGIC RHINITIS 04/12/2009  . ASTHMA 04/12/2009  . GERD  04/12/2009  . URINARY INCONTINENCE 04/12/2009    Past Surgical History:  Procedure Laterality Date  . ABDOMINAL HYSTERECTOMY  1994   TAH/ BLADDER SUSPENSION  . BREAST BIOPSY  1972   benign  . CHOLECYSTECTOMY N/A 07/12/2013   Procedure: LAPAROSCOPIC CHOLECYSTECTOMY WITH INTRAOPERATIVE CHOLANGIOGRAM;  Surgeon: Edward Jolly, MD;  Location: [facility];  Service: General;  Laterality: N/A;  . COLONOSCOPY  06-24-12   per Dr. Earlean Shawl, benign polyps, repeat in 5 yrs    . ERCP N/A 07/14/2013   Procedure: ENDOSCOPIC RETROGRADE CHOLANGIOPANCREATOGRAPHY (ERCP);  Surgeon: Gatha Mayer, MD;  Location: [facility];  Service: Endoscopy;  Laterality: N/A;  . TONSILLECTOMY    . TOTAL HIP ARTHROPLASTY Right 01/13/2015   Procedure: RIGHT TOTAL HIP ARTHROPLASTY ANTERIOR APPROACH;  Surgeon: Gaynelle Arabian, MD;  Location: [facility];  Service: Orthopedics;  Laterality: Right;  . TUBAL LIGATION      OB History    Gravida Para Term Preterm AB Living   2 1 1   1 1    SAB TAB Ectopic Multiple Live Births   1               Home Medications    Prior to Admission medications   Medication Sig Start Date End Date Taking? Authorizing Provider  buPROPion (WELLBUTRIN XL) 300 MG 24 hr tablet Take 300 mg by mouth daily.     Yes [provider]  Cholecalciferol (VITAMIN D PO) Take 5,000 Units by mouth daily. Reported on 06/09/2015   Yes [provider]  esomeprazole (NEXIUM) 20 MG capsule Take 20 mg by mouth daily.    Yes [provider]  ezetimibe (ZETIA) 10 MG tablet Take 1 tablet (10 mg total) by mouth daily. 02/24/16  Yes Laurey Morale, MD  fenofibrate 160 MG tablet TAKE 1 TABLET BY MOUTH EVERY DAY 04/16/17  Yes Laurey Morale, MD  losartan (COZAAR) 50 MG tablet TAKE 1 TABLET (50 MG TOTAL) BY MOUTH DAILY. 08/15/16  Yes Laurey Morale, MD  metoprolol succinate (TOPROL-XL) 25 MG 24 hr tablet TAKE 1 TABLET (25 MG TOTAL) BY MOUTH DAILY. 09/01/16  Yes Laurey Morale, MD  MYRBETRIQ 50 MG TB24  tablet Take 50 mg by mouth daily. 03/02/16  Yes [provider]  sertraline (ZOLOFT) 100 MG tablet Take 200 mg by mouth daily.    Yes [provider]  Aspirin-Calcium Carbonate 81-300 MG TABS Take 1 tablet by mouth daily. Reported on 03/05/2015    [provider]  ondansetron (ZOFRAN) 4 MG tablet Take 1 tablet (4 mg total) by mouth every 8 (eight) hours as needed for nausea or vomiting. 05/12/17   Zigmund Gottron, NP  oseltamivir (TAMIFLU) 75 MG capsule Take 1 capsule (75 mg total) by mouth every 12 (twelve) hours for 5 days. 05/12/17 05/17/17  Zigmund Gottron, NP  topiramate (TOPAMAX) 25 MG tablet Take 25 mg by mouth daily.    [provider]  DETROL LA 4 MG 24 hr capsule TAKE ONE CAPSULE BY MOUTH EVERY DAY 08/18/11 09/21/12  Bennetta Laos, MD    Family History Family History  Problem Relation Age of Onset  . Hypertension Mother   . Osteoporosis Mother   . Heart disease Father   . Osteoporosis Sister   . Breast cancer Paternal Aunt        Age 34's  . Diabetes Paternal Uncle     Social History Social History   Tobacco Use  . Smoking status: Former Smoker    Years: 42.00    Types: Cigarettes    Last attempt to quit: 07/09/2008    Years since quitting: 8.8  . Smokeless tobacco: Never Used  . Tobacco comment: quit 6 months ago  Substance Use Topics  . Alcohol use: Yes    Alcohol/week: 1.2 oz    Types: 2 Glasses of wine per week    Comment: occ  . Drug use: No     Allergies   Ace inhibitors; Codeine; Fentanyl; and Statins   Review of Systems Review of Systems   Physical Exam Triage Vital Signs ED Triage Vitals  Enc Vitals Group     BP 05/12/17 1618 (!) 158/79     Pulse Rate 05/12/17 1618 (!) 109     Resp 05/12/17 1618 16     Temp 05/12/17 1618 100.2 F (37.9 C)     Temp Source 05/12/17 1618 Oral     SpO2 05/12/17 1618 99 %     Weight --      Height --      Head Circumference --      Peak Flow --      Pain Score 05/12/17  1620 5     Pain Loc --      Pain Edu? --      Excl. in [place]? --    No data found.  Updated Vital Signs BP 139/84 (BP  Location: Right Arm)   Pulse (!) 112   Temp (!) 100.5 F (38.1 C) (Oral)   Resp 16   SpO2 99%   Visual Acuity Right Eye Distance:   Left Eye Distance:   Bilateral Distance:    Right Eye Near:   Left Eye Near:    Bilateral Near:     Physical Exam  Constitutional: She is oriented to person, place, and time. She appears well-developed and well-nourished. No distress.  HENT:  Head: Normocephalic and atraumatic.  Right Ear: Tympanic membrane, external ear and ear canal normal.  Left Ear: Tympanic membrane, external ear and ear canal normal.  Nose: Rhinorrhea present.  Mouth/Throat: Uvula is midline, oropharynx is clear and moist and mucous membranes are normal. Tonsils are 0 on the right. Tonsils are 0 on the left. No tonsillar exudate.  Eyes: Conjunctivae and EOM are normal. Pupils are equal, round, and reactive to light.  Cardiovascular: Regular rhythm and normal heart sounds. Tachycardia present.  Pulmonary/Chest: Effort normal. She has decreased breath sounds.  Neurological: She is alert and oriented to person, place, and time.  Skin: Skin is warm and dry.     UC Treatments / Results  Labs (all labs ordered are listed, but only abnormal results are displayed) Labs Reviewed - No data to display  EKG  EKG Interpretation None       Radiology Dg Chest 2 View  Result Date: 05/12/2017 CLINICAL DATA:  Patient states that she has cough and flu like symptoms x 4 days. Former smoker. Hx of allergy induced asthma, HTN EXAM: CHEST  2 VIEW COMPARISON:  07/09/2013 FINDINGS: Cardiac silhouette is normal in size. Normal mediastinal and hilar contours. Clear lungs.  No pleural effusion or pneumothorax. Skeletal structures are intact. IMPRESSION: No active cardiopulmonary disease. Electronically Signed   By: Lajean Manes M.D.   On: 05/12/2017 16:59     Procedures Procedures (including critical care time)  Medications Ordered in UC Medications  acetaminophen (TYLENOL) tablet 975 mg (not administered)  ondansetron (ZOFRAN-ODT) disintegrating tablet 4 mg (4 mg Oral Given 05/12/17 1643)     Initial Impression / Assessment and Plan / UC Course  I have reviewed the triage vital signs and the nursing notes.  Pertinent labs & imaging results that were available during my care of the patient were reviewed by me and considered in my medical decision making (see chart for details).     Negative chest xray today. Non toxic in appearance, without hypoxia, tachypnea. Tachycardia on arrival to clinic. Patient states she has a history of tachycardia and was started on metoprolol for this, has not taken this medication in the past few days. Patient is taking fluids without difficulty throughout stay in clinic today. Will initiate tamiflu due to known exposure and >age 71. Encouraged fluid intake, tylenol for pain and fever control. zofran for nausea. Return precautions provided. If symptoms worsen or do not improve in the next week to return to be seen or to follow up with PCP.  Patient verbalized understanding and agreeable to plan.  Ambulatory out of clinic without difficulty.    Final Clinical Impressions(s) / UC Diagnoses   Final diagnoses:  Influenza-like illness    ED Discharge Orders        Ordered    oseltamivir (TAMIFLU) 75 MG capsule  Every 12 hours     05/12/17 1723    ondansetron (ZOFRAN) 4 MG tablet  Every 8 hours PRN     05/12/17 1723  Controlled Substance Prescriptions [place] Controlled Substance Registry consulted? Not Applicable   Zigmund Gottron, NP 05/12/17 1730    Zigmund Gottron, NP 05/12/17 1739

## 2017-05-15 ENCOUNTER — Ambulatory Visit: Payer: Self-pay | Admitting: Family Medicine

## 2017-05-15 ENCOUNTER — Encounter: Payer: Self-pay | Admitting: Family Medicine

## 2017-05-15 ENCOUNTER — Ambulatory Visit: Payer: Medicare Other | Admitting: Family Medicine

## 2017-05-15 VITALS — BP 150/90 | HR 95 | Temp 99.0°F | Wt 166.8 lb

## 2017-05-15 DIAGNOSIS — R6889 Other general symptoms and signs: Secondary | ICD-10-CM

## 2017-05-15 MED ORDER — AMOXICILLIN-POT CLAVULANATE 875-125 MG PO TABS: 1.0000 | ORAL_TABLET | Freq: Two times a day (BID) | ORAL | 0 refills | Status: DC

## 2017-05-15 NOTE — Progress Notes (Signed)
Subjective:     Patient ID: Maria Schneider, female   DOB: 1945/10/30, 72 y.o.   MRN: [id number]  HPI Patient seen for follow-up regarding respiratory illness. She states she had onset last Wednesday and has a low-grade fever, sore throat, nausea, cough since then. She was seen at urgent care on the 23rd and treated empirically with Tamiflu. She was also given Tessalon but she's had severe cough at times not improved with that. She is using frequent Hall's cough drops. She states that overall she feels slightly better than she did over the weekend her cough is been more severe. She's also concerned because she said she had a fever as high as 100.6 earlier today.  She denies any dyspnea or any pleuritic pain. No nausea or vomiting. No diarrhea. She had chest x-ray at urgent care showed no pneumonia.  Past Medical History:  Diagnosis Date  . Allergy   . Arthritis   . Asthma   . Cancer ([place])    hx skin cancer  . Depression   . Detrusor instability   . Diverticulosis    not symptomatic  . GERD (gastroesophageal reflux disease)   . History of kidney stones   . History of pancreatitis    April 2015  . History of skin cancer   . Hyperlipidemia   . Hypertension   . Osteopenia   . Urinary incontinence   . Vitamin D deficiency    Past Surgical History:  Procedure Laterality Date  . ABDOMINAL HYSTERECTOMY  1994   TAH/ BLADDER SUSPENSION  . BREAST BIOPSY  1972   benign  . CHOLECYSTECTOMY N/A 07/12/2013   Procedure: LAPAROSCOPIC CHOLECYSTECTOMY WITH INTRAOPERATIVE CHOLANGIOGRAM;  Surgeon: Edward Jolly, MD;  Location: [facility];  Service: General;  Laterality: N/A;  . COLONOSCOPY  06-24-12   per Dr. Earlean Shawl, benign polyps, repeat in 5 yrs    . ERCP N/A 07/14/2013   Procedure: ENDOSCOPIC RETROGRADE CHOLANGIOPANCREATOGRAPHY (ERCP);  Surgeon: Gatha Mayer, MD;  Location: [facility];  Service: Endoscopy;  Laterality: N/A;  . TONSILLECTOMY    . TOTAL HIP ARTHROPLASTY Right 01/13/2015    Procedure: RIGHT TOTAL HIP ARTHROPLASTY ANTERIOR APPROACH;  Surgeon: Gaynelle Arabian, MD;  Location: [facility];  Service: Orthopedics;  Laterality: Right;  . TUBAL LIGATION      reports that she quit smoking about 8 years ago. Her smoking use included cigarettes. She quit after 42.00 years of use. she has never used smokeless tobacco. She reports that she drinks about 1.2 oz of alcohol per week. She reports that she does not use drugs. family history includes Breast cancer in her paternal aunt; Diabetes in her paternal uncle; Heart disease in her father; Hypertension in her mother; Osteoporosis in her mother and sister. Allergies  Allergen Reactions  . Ace Inhibitors     REACTION: cough  . Codeine Nausea And Vomiting  . Fentanyl Itching  . Statins     REACTION: MYALGIAS-muscle pain     Review of Systems  Constitutional: Positive for fatigue and fever. Negative for chills.  HENT: Positive for congestion.   Respiratory: Positive for cough. Negative for shortness of breath and wheezing.   Gastrointestinal: Negative for nausea and vomiting.  Genitourinary: Negative for dysuria.  Neurological: Negative for dizziness.       Objective:   Physical Exam  Constitutional: She appears well-developed and well-nourished.  HENT:  Right Ear: External ear normal.  Left Ear: External ear normal.  Mouth/Throat: Oropharynx is clear and moist.  Neck:  Neck supple.  Cardiovascular: Normal rate and regular rhythm.  Pulmonary/Chest: Effort normal and breath sounds normal. No respiratory distress. She has no wheezes. She has no rales.  Lymphadenopathy:    She has no cervical adenopathy.       Assessment:     Patient seen with recent flulike illness. Treated empirically with Tamiflu as above. Her main concerns at this point are some persistent low-grade fever and severe cough at night.  No respiratory distress and nonfocal lung exam at this time.  She feels better overall which is encouraging.    Plan:      -Leave off Hall's cough drops or any other mentholated products -We did print prescription for Augmentin 875 mg to consider starting if her fevers not resolving by tomorrow. We did not obtain chest x-ray today since her lung exam was unremarkable and she had recent negative chest x-ray couple days ago. -Follow-up immediately for any shortness of breath or any other worsening symptoms.  Eulas Post MD [facility]

## 2017-05-15 NOTE — Patient Instructions (Addendum)
Leave off the mentholated cough drops Consider OTC [place] cough drops.   Drink plenty of fluids. Consider starting the antibiotic for any persistent fever.

## 2017-05-21 ENCOUNTER — Ambulatory Visit: Payer: Medicare Other | Admitting: Family Medicine

## 2017-05-21 ENCOUNTER — Encounter: Payer: Self-pay | Admitting: Family Medicine

## 2017-05-21 VITALS — BP 132/70 | HR 101 | Temp 98.2°F | Wt 163.8 lb

## 2017-05-21 DIAGNOSIS — J209 Acute bronchitis, unspecified: Secondary | ICD-10-CM

## 2017-05-21 MED ORDER — AZITHROMYCIN 250 MG PO TABS: ORAL_TABLET | ORAL | 0 refills | Status: DC

## 2017-05-21 MED ORDER — HYDROCODONE-HOMATROPINE 5-1.5 MG/5ML PO SYRP: 5.0000 mL | ORAL_SOLUTION | ORAL | 0 refills | Status: DC | PRN

## 2017-05-21 NOTE — Progress Notes (Signed)
   Subjective:    Patient ID: Maria Schneider, female    DOB: 1945/12/17, 72 y.o.   MRN: [id number]  HPI Here for 10 days of URI symptoms, primarily chest tightness and a dry cough. No fever. She has taken Augmentin for 7 days with no relief.    Review of Systems  Constitutional: Negative.   HENT: Negative.   Eyes: Negative.   Respiratory: Positive for cough and chest tightness.        Objective:   Physical Exam  Constitutional: She appears well-developed and well-nourished.  HENT:  Right Ear: External ear normal.  Left Ear: External ear normal.  Nose: Nose normal.  Mouth/Throat: Oropharynx is clear and moist.  Eyes: Conjunctivae are normal.  Neck: No thyromegaly present.  Cardiovascular: Normal rate, regular rhythm, normal heart sounds and intact distal pulses.  Pulmonary/Chest: No respiratory distress. She has no wheezes. She has no rales.  Scattered rhonchi   Lymphadenopathy:    She has no cervical adenopathy.          Assessment & Plan:  Bronchitis, stop Augmentin and try a Zpack. Use Hydromet for cough. [name], MD

## 2017-07-02 ENCOUNTER — Ambulatory Visit: Payer: Self-pay | Admitting: Family Medicine

## 2017-07-02 ENCOUNTER — Encounter: Payer: Self-pay | Admitting: Family Medicine

## 2017-07-02 ENCOUNTER — Ambulatory Visit: Payer: Medicare Other | Admitting: Family Medicine

## 2017-07-02 VITALS — BP 120/68 | HR 66 | Temp 98.1°F | Ht 65.0 in | Wt 164.2 lb

## 2017-07-02 DIAGNOSIS — Z0289 Encounter for other administrative examinations: Secondary | ICD-10-CM

## 2017-07-02 DIAGNOSIS — R682 Dry mouth, unspecified: Secondary | ICD-10-CM

## 2017-07-02 DIAGNOSIS — S29019A Strain of muscle and tendon of unspecified wall of thorax, initial encounter: Secondary | ICD-10-CM | POA: Diagnosis not present

## 2017-07-02 NOTE — Progress Notes (Signed)
   Subjective:    Patient ID: Maria Schneider, female    DOB: 1946/03/18, 72 y.o.   MRN: [id number]  HPI Here for several issues. First she has been spring cleaning her house and for the past 2 weeks she has felt a sharp pain in the right upper back. Heat helps. Tylenol did not help at all. Second for about a month she has dealt with a very dry mouth. No ST or PND. No recent medication changes, but 6 months ago she was started on Myrbetriq for bladder problems. This has been very successful.    Review of Systems  Constitutional: Negative.   HENT: Negative.  Negative for congestion.   Eyes: Negative.   Respiratory: Negative.   Cardiovascular: Negative.   Musculoskeletal: Positive for myalgias.       Objective:   Physical Exam  Constitutional: She is oriented to person, place, and time. She appears well-developed and well-nourished. No distress.  HENT:  Right Ear: External ear normal.  Left Ear: External ear normal.  Nose: Nose normal.  Mouth/Throat: Oropharynx is clear and moist.  Eyes: Conjunctivae are normal.  Neck: No thyromegaly present.  Pulmonary/Chest: Effort normal and breath sounds normal. No respiratory distress. She has no wheezes. She has no rales.  Musculoskeletal:  She is tender in the right upper back between the spine and the scapula. ROM of the spine is full   Lymphadenopathy:    She has no cervical adenopathy.  Neurological: She is alert and oriented to person, place, and time.          Assessment & Plan:  For the dry mouth, she can drink plenty of fluids. I suspect this may be a side effect of the Myrbetriq, but she will stay on this for now. Get labs to rule out diabetes, etc. Otherwise she has a muscle strain in the back. Rest, use heat and take Aleve bid as needed.  [name], MD

## 2017-07-03 LAB — CBC WITH DIFFERENTIAL/PLATELET
Basophils Absolute: 0 10*3/uL (ref 0.0–0.1)
Basophils Relative: 0.4 % (ref 0.0–3.0)
EOS PCT: 2.5 % (ref 0.0–5.0)
Eosinophils Absolute: 0.1 10*3/uL (ref 0.0–0.7)
HCT: 35.9 % — ABNORMAL LOW (ref 36.0–46.0)
HEMOGLOBIN: 11.7 g/dL — AB (ref 12.0–15.0)
LYMPHS PCT: 19.6 % (ref 12.0–46.0)
Lymphs Abs: 1 10*3/uL (ref 0.7–4.0)
MCHC: 32.6 g/dL (ref 30.0–36.0)
MCV: 83.6 fl (ref 78.0–100.0)
MONOS PCT: 8.4 % (ref 3.0–12.0)
Monocytes Absolute: 0.4 10*3/uL (ref 0.1–1.0)
Neutro Abs: 3.4 10*3/uL (ref 1.4–7.7)
Neutrophils Relative %: 69.1 % (ref 43.0–77.0)
Platelets: 232 10*3/uL (ref 150.0–400.0)
RBC: 4.29 Mil/uL (ref 3.87–5.11)
RDW: 13.9 % (ref 11.5–15.5)
WBC: 4.9 10*3/uL (ref 4.0–10.5)

## 2017-07-03 LAB — HEMOGLOBIN A1C: Hgb A1c MFr Bld: 5.5 % (ref 4.6–6.5)

## 2017-07-03 LAB — BASIC METABOLIC PANEL
BUN: 17 mg/dL (ref 6–23)
CHLORIDE: 105 meq/L (ref 96–112)
CO2: 28 mEq/L (ref 19–32)
Calcium: 9.7 mg/dL (ref 8.4–10.5)
Creatinine, Ser: 1.08 mg/dL (ref 0.40–1.20)
GFR: 53.36 mL/min — AB (ref >60.00)
Glucose, Bld: 85 mg/dL (ref 70–99)
POTASSIUM: 4.6 meq/L (ref 3.5–5.1)
SODIUM: 141 meq/L (ref 135–145)

## 2017-07-03 LAB — TSH: TSH: 2.19 u[IU]/mL (ref 0.35–4.50)

## 2017-07-17 ENCOUNTER — Ambulatory Visit: Payer: Medicare Other | Admitting: Family Medicine

## 2017-07-17 ENCOUNTER — Encounter: Payer: Self-pay | Admitting: Family Medicine

## 2017-07-17 ENCOUNTER — Ambulatory Visit (INDEPENDENT_AMBULATORY_CARE_PROVIDER_SITE_OTHER)
Admission: RE | Admit: 2017-07-17 | Discharge: 2017-07-17 | Disposition: A | Discharge status: Home / Self Care | Payer: Self-pay | Source: Ambulatory Visit | Attending: Family Medicine | Admitting: Family Medicine

## 2017-07-17 VITALS — BP 120/70 | HR 88 | Temp 98.3°F | Ht 65.0 in | Wt 166.4 lb

## 2017-07-17 DIAGNOSIS — R109 Unspecified abdominal pain: Secondary | ICD-10-CM | POA: Diagnosis not present

## 2017-07-17 NOTE — Progress Notes (Signed)
   Subjective:    Patient ID: Maria Schneider, female    DOB: 31-Dec-1945, 72 y.o.   MRN: [id number]  HPI Here to recheck a sharp pain in the right flank that started 4 weeks ago after she cleaned her shower. She has been using Ibuprofen and heat with little benefit.    Review of Systems  Constitutional: Negative.   Respiratory: Negative.   Cardiovascular: Negative.   Genitourinary: Positive for flank pain.       Objective:   Physical Exam  Constitutional: She appears well-developed and well-nourished.  Cardiovascular: Normal rate, regular rhythm, normal heart sounds and intact distal pulses.  Pulmonary/Chest: Effort normal and breath sounds normal.  Musculoskeletal:  Tender in the right flank along the posterior inferior rib margin, full ROM of the spine           Assessment & Plan:  Flank pain, likely a muscular strain. Get Xrays of the right ribs. Refer her to PT.  [name], MD

## 2017-07-23 ENCOUNTER — Encounter: Payer: Self-pay | Admitting: Physical Therapy

## 2017-07-23 ENCOUNTER — Ambulatory Visit: Payer: Medicare Other | Attending: Family Medicine | Admitting: Physical Therapy

## 2017-07-23 DIAGNOSIS — M546 Pain in thoracic spine: Secondary | ICD-10-CM | POA: Insufficient documentation

## 2017-07-23 NOTE — Therapy (Signed)
[facility] [address] [place], [place], [address] Phone: [phone]   Fax:  [phone]  Physical Therapy Evaluation  Patient Details  Name: Maria Schneider MRN: [id number] Date of Birth: 1945-09-09 Referring Provider: Laurey Morale, MD   Encounter Date: 07/23/2017  PT End of Session - 07/23/17 1229    Visit Number  1    Number of Visits  6    Date for PT Re-Evaluation  08/13/17    Authorization Type  [employer] Medicare    PT Start Time  1155 pt arrived late    PT Stop Time  1226    PT Time Calculation (min)  31 min    Activity Tolerance  Patient tolerated treatment well    Behavior During Therapy  [facility] for tasks assessed/performed       Past Medical History:  Diagnosis Date  . Allergy   . Arthritis   . Asthma   . Cancer ([place])    hx skin cancer  . Depression   . Detrusor instability   . Diverticulosis    not symptomatic  . GERD (gastroesophageal reflux disease)   . History of kidney stones   . History of pancreatitis    April 2015  . History of skin cancer   . Hyperlipidemia   . Hypertension   . Osteopenia   . Urinary incontinence   . Vitamin D deficiency     Past Surgical History:  Procedure Laterality Date  . ABDOMINAL HYSTERECTOMY  1994   TAH/ BLADDER SUSPENSION  . BREAST BIOPSY  1972   benign  . CHOLECYSTECTOMY N/A 07/12/2013   Procedure: LAPAROSCOPIC CHOLECYSTECTOMY WITH INTRAOPERATIVE CHOLANGIOGRAM;  Surgeon: Edward Jolly, MD;  Location: [facility];  Service: General;  Laterality: N/A;  . COLONOSCOPY  06-24-12   per Dr. Earlean Shawl, benign polyps, repeat in 5 yrs    . ERCP N/A 07/14/2013   Procedure: ENDOSCOPIC RETROGRADE CHOLANGIOPANCREATOGRAPHY (ERCP);  Surgeon: Gatha Mayer, MD;  Location: [facility];  Service: Endoscopy;  Laterality: N/A;  . TONSILLECTOMY    . TOTAL HIP ARTHROPLASTY Right 01/13/2015   Procedure: RIGHT TOTAL HIP ARTHROPLASTY ANTERIOR APPROACH;  Surgeon: Gaynelle Arabian, MD;   Location: [facility];  Service: Orthopedics;  Laterality: Right;  . TUBAL LIGATION      There were no vitals filed for this visit.   Subjective Assessment - 07/23/17 1154    Subjective  Pt is a 72 y/o female who presents to OPPT with 1 month hx of Rt sided flank pain, which began when cleaning and overstretching with arms out while on all fours.  Pt dx by MD with muscle strain, and only relief pt has been able to get is with heating pad.  Reports pain medication and muscle relaxers ineffective.  Pt reports mild improvment but overall no significant change x 1 month.  Has some radiating pain from Rt flank to hip.    Pertinent History  Rt THA    Limitations  Sitting;House hold activities    How long can you sit comfortably?  keeps adjusting, 30 min    Patient Stated Goals  be able to travel to [place] (going 5/26), decrease pain    Currently in Pain?  Yes    Pain Score  3  up to 7/10; at best 3/10    Pain Location  Flank    Pain Orientation  Right    Pain Descriptors / Indicators  Burning    Pain Type  Acute  pain    Pain Onset  More than a month ago    Pain Frequency  Constant    Aggravating Factors   sitting, twisting    Pain Relieving Factors  heating pad         [facility] PT Assessment - 07/23/17 1159      Assessment   Medical Diagnosis  Right flank pain    Referring Provider  Laurey Morale, MD    Onset Date/Surgical Date  -- 1 month ago    Hand Dominance  Right    Next MD Visit  PRN    Prior Therapy  n/a      Precautions   Precautions  None      Restrictions   Weight Bearing Restrictions  No      Balance Screen   Has the patient fallen in the past 6 months  No    Has the patient had a decrease in activity level because of a fear of falling?   Yes    Is the patient reluctant to leave their home because of a fear of falling?   No      Home Environment   Living Environment  Private residence    Living Arrangements  Spouse/significant other    Type of [place] to enter    Entrance Stairs-Number of Steps  2    Entrance Stairs-Rails  None    Home Layout  Two level;Bed/bath upstairs      Prior Function   Level of Independence  Independent    Vocation  Retired    Biomedical scientist  retired Pharmacist, hospital    Leisure  reading, daily walking on treadmill (1.5 mph x 10 min)      Cognition   Overall Cognitive Status  Within Functional Limits for tasks assessed      Observation/Other Assessments   Focus on Therapeutic Outcomes (FOTO)   49 (51% limited; predicted 35% limited)      Posture/Postural Control   Posture/Postural Control  Postural limitations    Postural Limitations  Rounded Shoulders;Forward head;Increased thoracic kyphosis      ROM / Strength   AROM / PROM / Strength  AROM;Strength      AROM   Overall AROM Comments  thoracolumbar ROM WNL with pain bil sidebending (c/o stretch to Lt)      Palpation   Palpation comment  trigger points thoracic and lumbar paraspinals and QL on Rt                Objective measurements completed on examination: See above findings.      [facility] Adult PT Treatment/Exercise - 07/23/17 1159      Exercises   Exercises  Lumbar      Lumbar Exercises: Stretches   Other Lumbar Stretch Exercise  seated and standing QL stretch x30 sec each-see HEP for details             PT Education - 07/23/17 1228    Education provided  Yes    Education Details  HEP, DN    Person(s) Educated  Patient    Methods  Explanation;Demonstration;Handout    Comprehension  Verbalized understanding;Returned demonstration;Need further instruction          PT Long Term Goals - 07/23/17 1405      PT LONG TERM GOAL #1   Title  independent with HEP    Status  New    Target Date  08/13/17  PT LONG TERM GOAL #2   Title  perform thoracic/lumbar ROM without increase in pain for improved function    Status  New    Target Date  08/13/17      PT LONG TERM GOAL #3   Title  report ability to  sit > 1 hour without increase in pain in preparation for long flight    Status  New    Target Date  08/13/17             Plan - 07/23/17 1401    Clinical Impression Statement  Pt is a 72 y/o female who presents to OPPT with Rt sided thoracic/lumbar pain consistent with muscle strain.  Pt with active trigger points in QL and thoracis and lumbar paraspinals and may benefit from DN to address pain.  Will benefit from PT to address deficits, and pt planning to go to [place] 5/26, so will see 2x/wk x 3 weeks then determine if recert is indicated following her trip.    History and Personal Factors relevant to plan of care:  depression, HTN, asthma, incontinence, costochondritis, OA Rt hip s/p THA    Clinical Presentation  Stable    Clinical Decision Making  Low    Rehab Potential  Good    PT Frequency  2x / week    PT Duration  3 weeks    PT Treatment/Interventions  ADLs/Self Care Home Management;Cryotherapy;Electrical Stimulation;Moist Heat;Therapeutic exercise;Therapeutic activities;Functional mobility training;Neuromuscular re-education;Ultrasound;Patient/family education;Manual techniques;Taping;Dry needling    PT Next Visit Plan  DN to QL and lumbar/thoracic paraspinals, review HEP and add PRN    Consulted and Agree with Plan of Care  Patient       Patient will benefit from skilled therapeutic intervention in order to improve the following deficits and impairments:  Increased fascial restricitons, Increased muscle spasms, Pain  Visit Diagnosis: Pain in thoracic spine - Plan: PT plan of care cert/re-cert     Problem List Patient Active Problem List   Diagnosis Date Noted  . OA (osteoarthritis) of hip 01/13/2015  . B12 deficiency 01/07/2014  . Bradycardia 01/07/2014  . Abnormal cholangiogram - CBD stones suspected 07/14/2013  . Gall stone pancreatitis 07/09/2013  . PYELONEPHRITIS, ACUTE 05/22/2009  . COSTOCHONDRITIS 04/28/2009  . HYPERLIPIDEMIA 04/12/2009  . DEPRESSION  04/12/2009  . Essential hypertension 04/12/2009  . ALLERGIC RHINITIS 04/12/2009  . ASTHMA 04/12/2009  . GERD 04/12/2009  . URINARY INCONTINENCE 04/12/2009      Laureen Abrahams, PT, DPT 07/23/17 2:19 PM    [facility] [address] [place], [place], [address] Phone: [phone]   Fax:  [phone]  Name: Maria Schneider MRN: [id number] Date of Birth: [birth year]

## 2017-07-23 NOTE — Patient Instructions (Addendum)
Access Code: [email or web address]  URL: [email or web address]  Date: 07/23/2017  Prepared by: Faustino Congress   Exercises  Standing Quadratus Lumborum Stretch with Doorway - 3 reps - 1 sets - 30 sec hold - 2x daily - 7x weekly  Sidelying Quadratus Lumborum Stretch on Table - 3 reps - 1 sets - 30 hold - 2x daily - 7x weekly   Trigger Point Dry Needling  . What is Trigger Point Dry Needling (DN)? o DN is a physical therapy technique used to treat muscle pain and dysfunction. Specifically, DN helps deactivate muscle trigger points (muscle knots).  o A thin filiform needle is used to penetrate the skin and stimulate the underlying trigger point. The goal is for a local twitch response (LTR) to occur and for the trigger point to relax. No medication of any kind is injected during the procedure.   . What Does Trigger Point Dry Needling Feel Like?  o The procedure feels different for each individual patient. Some patients report that they do not actually feel the needle enter the skin and overall the process is not painful. Very mild bleeding may occur. However, many patients feel a deep cramping in the muscle in which the needle was inserted. This is the local twitch response.   [name] How Will I feel after the treatment? o Soreness is normal, and the onset of soreness may not occur for a few hours. Typically this soreness does not last longer than two days.  o Bruising is uncommon, however; ice can be used to decrease any possible bruising.  o In rare cases feeling tired or nauseous after the treatment is normal. In addition, your symptoms may get worse before they get better, this period will typically not last longer than 24 hours.   . What Can I do After My Treatment? o Increase your hydration by drinking more water for the next 24 hours. o You may place ice or heat on the areas treated that have become sore, however, do not use heat on inflamed or bruised areas. Heat often brings more relief  post needling. o You can continue your regular activities, but vigorous activity is not recommended initially after the treatment for 24 hours. o DN is best combined with other physical therapy such as strengthening, stretching, and other therapies.

## 2017-07-25 ENCOUNTER — Ambulatory Visit: Payer: Medicare Other | Admitting: Physical Therapy

## 2017-07-26 ENCOUNTER — Ambulatory Visit: Payer: Medicare Other | Admitting: Physical Therapy

## 2017-07-26 ENCOUNTER — Encounter: Payer: Self-pay | Admitting: Physical Therapy

## 2017-07-26 DIAGNOSIS — M546 Pain in thoracic spine: Secondary | ICD-10-CM

## 2017-07-26 NOTE — Therapy (Signed)
[facility] [address] [place], [place], [address] Phone: [phone]   Fax:  [phone]  Physical Therapy Treatment  Patient Details  Name: Maria Schneider MRN: [id number] Date of Birth: 29-May-1945 Referring Provider: Laurey Morale, MD   Encounter Date: 07/26/2017  PT End of Session - 07/26/17 0835    Visit Number  2    Number of Visits  6    Date for PT Re-Evaluation  08/13/17    Authorization Type  [employer] Medicare    PT Start Time  0757    PT Stop Time  0835    PT Time Calculation (min)  38 min    Activity Tolerance  Patient tolerated treatment well    Behavior During Therapy  [facility] for tasks assessed/performed       Past Medical History:  Diagnosis Date  . Allergy   . Arthritis   . Asthma   . Cancer ([place])    hx skin cancer  . Depression   . Detrusor instability   . Diverticulosis    not symptomatic  . GERD (gastroesophageal reflux disease)   . History of kidney stones   . History of pancreatitis    April 2015  . History of skin cancer   . Hyperlipidemia   . Hypertension   . Osteopenia   . Urinary incontinence   . Vitamin D deficiency     Past Surgical History:  Procedure Laterality Date  . ABDOMINAL HYSTERECTOMY  1994   TAH/ BLADDER SUSPENSION  . BREAST BIOPSY  1972   benign  . CHOLECYSTECTOMY N/A 07/12/2013   Procedure: LAPAROSCOPIC CHOLECYSTECTOMY WITH INTRAOPERATIVE CHOLANGIOGRAM;  Surgeon: Edward Jolly, MD;  Location: [facility];  Service: General;  Laterality: N/A;  . COLONOSCOPY  06-24-12   per Dr. Earlean Shawl, benign polyps, repeat in 5 yrs    . ERCP N/A 07/14/2013   Procedure: ENDOSCOPIC RETROGRADE CHOLANGIOPANCREATOGRAPHY (ERCP);  Surgeon: Gatha Mayer, MD;  Location: [facility];  Service: Endoscopy;  Laterality: N/A;  . TONSILLECTOMY    . TOTAL HIP ARTHROPLASTY Right 01/13/2015   Procedure: RIGHT TOTAL HIP ARTHROPLASTY ANTERIOR APPROACH;  Surgeon: Gaynelle Arabian, MD;  Location: [facility];  Service: Orthopedics;  Laterality: Right;  . TUBAL LIGATION      There were no vitals filed for this visit.  Subjective Assessment - 07/26/17 0800    Subjective  went to ortho MD yesterday due to hip pain, mild OA but overall doing well.  plans to go to [place] in 3 weeks.  wants to try DN.  exercises seem to help.    Patient Stated Goals  be able to travel to [place] (going 5/26), decrease pain    Currently in Pain?  Yes    Pain Score  1     Pain Location  Flank    Pain Orientation  Right    Pain Descriptors / Indicators  Burning    Pain Type  Acute pain    Pain Onset  More than a month ago    Pain Frequency  Constant    Aggravating Factors   sitting, twisting    Pain Relieving Factors  heating pad, exercises help                       [facility] Adult PT Treatment/Exercise - 07/26/17 0803      Lumbar Exercises: Stretches   Other Lumbar Stretch Exercise  seated and standing QL stretch 2x30 sec  each      Lumbar Exercises: Aerobic   Nustep  L3 x 6 min      Manual Therapy   Manual Therapy  Soft tissue mobilization    Manual therapy comments  skilled palpation of soft tissue during DN    Soft tissue mobilization  Rt QL and thoracic/lumbar paraspinals       Trigger Point Dry Needling - 07/26/17 0827    Consent Given?  Yes    Education Handout Provided  Yes    Muscles Treated Upper Body  Quadratus Lumborum;Longissimus    Longissimus Response  Palpable increased muscle length;Twitch response elicited lower thoracic/upper lumbar paraspinals and QL                PT Long Term Goals - 07/23/17 1405      PT LONG TERM GOAL #1   Title  independent with HEP    Status  New    Target Date  08/13/17      PT LONG TERM GOAL #2   Title  perform thoracic/lumbar ROM without increase in pain for improved function    Status  New    Target Date  08/13/17      PT LONG TERM GOAL #3   Title  report ability to sit > 1 hour without increase in pain in preparation for  long flight    Status  New    Target Date  08/13/17            Plan - 07/26/17 0835    Clinical Impression Statement  Pt tolerated DN and manual therapy well today with reports of decreased tightness following session.  Feels stretches are helping and pain decreased this AM.  Will continue to benefit from PT to maximize function.    PT Treatment/Interventions  ADLs/Self Care Home Management;Cryotherapy;Electrical Stimulation;Moist Heat;Therapeutic exercise;Therapeutic activities;Functional mobility training;Neuromuscular re-education;Ultrasound;Patient/family education;Manual techniques;Taping;Dry needling    PT Next Visit Plan  assess response and continue DN to QL and lumbar/thoracic paraspinals PRN (may need lower lumbar paraspinals next time), review HEP and add core/back strengthening exercises to decrease reinjury risk    Consulted and Agree with Plan of Care  Patient       Patient will benefit from skilled therapeutic intervention in order to improve the following deficits and impairments:  Increased fascial restricitons, Increased muscle spasms, Pain  Visit Diagnosis: Pain in thoracic spine     Problem List Patient Active Problem List   Diagnosis Date Noted  . OA (osteoarthritis) of hip 01/13/2015  . B12 deficiency 01/07/2014  . Bradycardia 01/07/2014  . Abnormal cholangiogram - CBD stones suspected 07/14/2013  . Gall stone pancreatitis 07/09/2013  . PYELONEPHRITIS, ACUTE 05/22/2009  . COSTOCHONDRITIS 04/28/2009  . HYPERLIPIDEMIA 04/12/2009  . DEPRESSION 04/12/2009  . Essential hypertension 04/12/2009  . ALLERGIC RHINITIS 04/12/2009  . ASTHMA 04/12/2009  . GERD 04/12/2009  . URINARY INCONTINENCE 04/12/2009      Laureen Abrahams, PT, DPT 07/26/17 8:37 AM     [facility] [address], [place], [place], [address] Phone: [phone]   Fax:  [phone]  Name: Maria Schneider MRN:  [id number] Date of Birth: 09-09-1945

## 2017-07-31 ENCOUNTER — Encounter: Payer: Self-pay | Admitting: Physical Therapy

## 2017-07-31 ENCOUNTER — Ambulatory Visit: Payer: Medicare Other | Admitting: Physical Therapy

## 2017-07-31 DIAGNOSIS — M546 Pain in thoracic spine: Secondary | ICD-10-CM | POA: Diagnosis not present

## 2017-07-31 NOTE — Patient Instructions (Signed)
Maria Schneider PT [facility] [address], [address] [place],  [address] Phone # [phone] Fax [phone]    

## 2017-07-31 NOTE — Therapy (Signed)
[facility] [address] [place], [place], [address] Phone: [phone]   Fax:  [phone]  Physical Therapy Treatment  Patient Details  Name: Maria Schneider MRN: [id number] Date of Birth: 16-Oct-1945 Referring Provider: Laurey Morale, MD   Encounter Date: 07/31/2017  PT End of Session - 07/31/17 1309    Visit Number  3    Number of Visits  6    Date for PT Re-Evaluation  08/13/17    Authorization Type  [employer]HC Medicare    PT Start Time  1150    PT Stop Time  1242    PT Time Calculation (min)  52 min    Activity Tolerance  Patient tolerated treatment well       Past Medical History:  Diagnosis Date  . Allergy   . Arthritis   . Asthma   . Cancer ([place])    hx skin cancer  . Depression   . Detrusor instability   . Diverticulosis    not symptomatic  . GERD (gastroesophageal reflux disease)   . History of kidney stones   . History of pancreatitis    April 2015  . History of skin cancer   . Hyperlipidemia   . Hypertension   . Osteopenia   . Urinary incontinence   . Vitamin D deficiency     Past Surgical History:  Procedure Laterality Date  . ABDOMINAL HYSTERECTOMY  1994   TAH/ BLADDER SUSPENSION  . BREAST BIOPSY  1972   benign  . CHOLECYSTECTOMY N/A 07/12/2013   Procedure: LAPAROSCOPIC CHOLECYSTECTOMY WITH INTRAOPERATIVE CHOLANGIOGRAM;  Surgeon: Edward Jolly, MD;  Location: [facility];  Service: General;  Laterality: N/A;  . COLONOSCOPY  06-24-12   per Dr. Earlean Shawl, benign polyps, repeat in 5 yrs    . ERCP N/A 07/14/2013   Procedure: ENDOSCOPIC RETROGRADE CHOLANGIOPANCREATOGRAPHY (ERCP);  Surgeon: Gatha Mayer, MD;  Location: [facility];  Service: Endoscopy;  Laterality: N/A;  . TONSILLECTOMY    . TOTAL HIP ARTHROPLASTY Right 01/13/2015   Procedure: RIGHT TOTAL HIP ARTHROPLASTY ANTERIOR APPROACH;  Surgeon: Gaynelle Arabian, MD;  Location: [facility];  Service: Orthopedics;  Laterality: Right;  . TUBAL  LIGATION      There were no vitals filed for this visit.  Subjective Assessment - 07/31/17 1152    Subjective  I thought the DN  helped.  No pain for a few days afterward.  Mostly just sore from keeping grandchildren.      Pertinent History  Rt THA    Currently in Pain?  No/denies    Pain Score  0-No pain    Pain Location  Flank    Pain Orientation  Right    Pain Type  Acute pain    Pain Frequency  Constant                       [facility] Adult PT Treatment/Exercise - 07/31/17 0001      Lumbar Exercises: Stretches   Other Lumbar Stretch Exercise  doorway psoas stretch/QL with UE raise 2x5      Lumbar Exercises: Standing   Other Standing Lumbar Exercises  Glute med stand tall ex 10x right/left      Moist Heat Therapy   Number Minutes Moist Heat  10 Minutes    Moist Heat Location  Lumbar Spine;Hip      Manual Therapy   Manual Therapy  Joint mobilization    Joint Mobilization  gentle neutral gapping  grade 2/3 3x 30 sec    Soft tissue mobilization  Rt QL and thoracic/lumbar paraspinals,right gluteals       Trigger Point Dry Needling - 07/31/17 1308    Consent Given?  Yes    Muscles Treated Lower Body  -- right lumbar multifidi; superficial thoracic paraspinals R    Gluteus Maximus Response  Twitch response elicited;Palpable increased muscle length      Right only     PT Education - 07/31/17 1309    Education provided  Yes    Education Details  doorway stretch and stand tall gluteal exercise    Person(s) Educated  Patient    Methods  Explanation;Demonstration;Handout    Comprehension  Returned demonstration;Verbalized understanding          PT Long Term Goals - 07/23/17 1405      PT LONG TERM GOAL #1   Title  independent with HEP    Status  New    Target Date  08/13/17      PT LONG TERM GOAL #2   Title  perform thoracic/lumbar ROM without increase in pain for improved function    Status  New    Target Date  08/13/17      PT LONG TERM GOAL  #3   Title  report ability to sit > 1 hour without increase in pain in preparation for long flight    Status  New    Target Date  08/13/17            Plan - 07/31/17 1310    Clinical Impression Statement  The patient feels good benefit from initial dry needling and manual therapy.  Improved soft tissue muscle length and decreased number of tender points at the end of treatment session.  Significant right pelvic drop during gait and with single limb stance indicating gluteal muscle weakness.  She is highly motivated with all to prepare her for her upcoming trip to [place].  Therapist closely monitoring response with all interventions.      Rehab Potential  Good    PT Frequency  2x / week    PT Duration  3 weeks    PT Treatment/Interventions  ADLs/Self Care Home Management;Cryotherapy;Electrical Stimulation;Moist Heat;Therapeutic exercise;Therapeutic activities;Functional mobility training;Neuromuscular re-education;Ultrasound;Patient/family education;Manual techniques;Taping;Dry needling    PT Next Visit Plan  assess response to  DN# 2  to right gluteals  and lumbar/thoracic paraspinals PRN '  review HEP and add core/back strengthening exercises to decrease reinjury risk; right gluteal strengthening       Patient will benefit from skilled therapeutic intervention in order to improve the following deficits and impairments:  Increased fascial restricitons, Increased muscle spasms, Pain  Visit Diagnosis: Pain in thoracic spine     Problem List Patient Active Problem List   Diagnosis Date Noted  . OA (osteoarthritis) of hip 01/13/2015  . B12 deficiency 01/07/2014  . Bradycardia 01/07/2014  . Abnormal cholangiogram - CBD stones suspected 07/14/2013  . Gall stone pancreatitis 07/09/2013  . PYELONEPHRITIS, ACUTE 05/22/2009  . COSTOCHONDRITIS 04/28/2009  . HYPERLIPIDEMIA 04/12/2009  . DEPRESSION 04/12/2009  . Essential hypertension 04/12/2009  . ALLERGIC RHINITIS 04/12/2009  . ASTHMA  04/12/2009  . GERD 04/12/2009  . URINARY INCONTINENCE 04/12/2009   Ruben Im, PT 07/31/17 1:16 PM Phone: [phone] Fax: [phone]  Alvera Singh 07/31/2017, 1:16 PM  [facility] [address] [place], [place], [address] Phone: [phone]   Fax:  [phone]  Name: Maria Schneider MRN:  [id number] Date of Birth: [birth year]

## 2017-08-02 ENCOUNTER — Encounter: Payer: Self-pay | Admitting: Physical Therapy

## 2017-08-02 ENCOUNTER — Ambulatory Visit: Payer: Medicare Other | Admitting: Physical Therapy

## 2017-08-02 DIAGNOSIS — M546 Pain in thoracic spine: Secondary | ICD-10-CM

## 2017-08-02 NOTE — Patient Instructions (Signed)
Access Code: [email or web address]  URL: [email or web address]  Date: 08/02/2017  Prepared by: Earlie Counts   Exercises  Supine Transversus Abdominis Bracing - Hands on Thighs - 10 reps - 1 sets - 5 hold - 1x daily - 7x weekly  Supine Transversus Abdominis Bracing with Double Leg Fallout - 10 reps - 1 sets - 1x daily - 7x weekly  Hooklying Isometric Hip Flexion - 10 reps - 1 sets - 5 hold - 1x daily - 7x weekly

## 2017-08-02 NOTE — Therapy (Signed)
[facility] [address], [place], [place], [address] Phone: [phone]   Fax:  [phone]  Physical Therapy Treatment  Patient Details  Name: Maria Schneider MRN: [id number] Date of Birth: 02/10/1946 Referring Provider: Laurey Morale, MD   Encounter Date: 08/02/2017  PT End of Session - 08/02/17 1152    Visit Number  3    Number of Visits  6    Date for PT Re-Evaluation  08/13/17    Authorization Type  [employer]HC Medicare    PT Start Time  1145    PT Stop Time  1230    PT Time Calculation (min)  45 min    Activity Tolerance  Patient tolerated treatment well    Behavior During Therapy  [facility] for tasks assessed/performed       Past Medical History:  Diagnosis Date  . Allergy   . Arthritis   . Asthma   . Cancer ([place])    hx skin cancer  . Depression   . Detrusor instability   . Diverticulosis    not symptomatic  . GERD (gastroesophageal reflux disease)   . History of kidney stones   . History of pancreatitis    April 2015  . History of skin cancer   . Hyperlipidemia   . Hypertension   . Osteopenia   . Urinary incontinence   . Vitamin D deficiency     Past Surgical History:  Procedure Laterality Date  . ABDOMINAL HYSTERECTOMY  1994   TAH/ BLADDER SUSPENSION  . BREAST BIOPSY  1972   benign  . CHOLECYSTECTOMY N/A 07/12/2013   Procedure: LAPAROSCOPIC CHOLECYSTECTOMY WITH INTRAOPERATIVE CHOLANGIOGRAM;  Surgeon: Edward Jolly, MD;  [facility];  Service: General;  Laterality: N/A;  . COLONOSCOPY  06-24-12   per Dr. Earlean Shawl, benign polyps, repeat in 5 yrs    . ERCP N/A 07/14/2013   Procedure: ENDOSCOPIC RETROGRADE CHOLANGIOPANCREATOGRAPHY (ERCP);  Surgeon: Gatha Mayer, MD;  Location: [facility];  Service: Endoscopy;  Laterality: N/A;  . TONSILLECTOMY    . TOTAL HIP ARTHROPLASTY Right 01/13/2015   Procedure: RIGHT TOTAL HIP ARTHROPLASTY ANTERIOR APPROACH;  Surgeon: Gaynelle Arabian, MD;  [facility]: [facility];  Service: Orthopedics;  Laterality: Right;  . TUBAL LIGATION      There were no vitals filed for this visit.  Subjective Assessment - 08/02/17 1147    Subjective  I am feeling better.  I am still aware of the little soreness.  I had a cortisone shot in left hip and the inner thigh feels better.     Limitations  Sitting;House hold activities    How long can you sit comfortably?  keeps adjusting, 30 min    Patient Stated Goals  be able to travel to [place] (going 5/26), decrease pain    Currently in Pain?  Yes    Pain Score  2     Pain Location  Flank    Pain Orientation  Right    Pain Descriptors / Indicators  Burning    Pain Type  Acute pain    Pain Onset  More than a month ago    Pain Frequency  Intermittent    Aggravating Factors   twisting    Pain Relieving Factors  heating pad, exercise help    Multiple Pain Sites  No                       [facility] Adult PT Treatment/Exercise -  08/02/17 0001      Lumbar Exercises: Standing   Other Standing Lumbar Exercises  Glute med stand tall ex 10x2 right/left      Modalities   Modalities  Electrical Stimulation;Moist Heat      Moist Heat Therapy   Number Minutes Moist Heat  20 Minutes    Moist Heat Location  Lumbar Spine right side in left sidely      Electrical Stimulation   Electrical Stimulation Location  right lumbar in left sidely    Electrical Stimulation Action  IFC    Electrical Stimulation Parameters  to patient tolerance    Electrical Stimulation Goals  Pain      Manual Therapy   Manual Therapy  Soft tissue mobilization    Soft tissue mobilization  right quadratus, right psoas, right gluteal i nleft sidely             PT Education - 08/02/17 1224    Education provided  Yes    Education Details  Access Code: [email or web address]     Person(s) Educated  Patient    Methods  Explanation;Demonstration;Verbal cues    Comprehension  Verbalized understanding;Returned demonstration          PT Long Term  Goals - 07/23/17 1405      PT LONG TERM GOAL #1   Title  independent with HEP    Status  New    Target Date  08/13/17      PT LONG TERM GOAL #2   Title  perform thoracic/lumbar ROM without increase in pain for improved function    Status  New    Target Date  08/13/17      PT LONG TERM GOAL #3   Title  report ability to sit > 1 hour without increase in pain in preparation for long flight    Status  New    Target Date  08/13/17            Plan - 08/02/17 1224    Clinical Impression Statement  Patient just has soreness in the right flank area.  Last visit helped her pain.  Patient responed well with dry needling.  Patient has tenderness located in right psoas and quadratus.  Patient needs VC for abdominal bracing.  Patient is walking  to prepare for her trip to [place].  Patient will benefi tfrom skilled therapy to work on core strength and gluteal strength while reduce muscle spasms.     Rehab Potential  Good    PT Frequency  2x / week    PT Duration  3 weeks    PT Treatment/Interventions  ADLs/Self Care Home Management;Cryotherapy;Electrical Stimulation;Moist Heat;Therapeutic exercise;Therapeutic activities;Functional mobility training;Neuromuscular re-education;Ultrasound;Patient/family education;Manual techniques;Taping;Dry needling    PT Next Visit Plan   DN to right gluteals  and lumbar/thoracic paraspinals PRN, core/back strengthening exercises to decrease reinjury risk; right gluteal strengthening    PT Home Exercise Plan  Access Code: [email or web address]     Recommended Other Services  MD signed initial eval    Consulted and Agree with Plan of Care  Patient       Patient will benefit from skilled therapeutic intervention in order to improve the following deficits and impairments:  Increased fascial restricitons, Increased muscle spasms, Pain  Visit Diagnosis: Pain in thoracic spine     Problem List Patient Active Problem List   Diagnosis Date Noted  . OA (osteoarthritis) of  hip 01/13/2015  . B12 deficiency 01/07/2014  . Bradycardia 01/07/2014  . Abnormal cholangiogram -  CBD stones suspected 07/14/2013  . Gall stone pancreatitis 07/09/2013  . PYELONEPHRITIS, ACUTE 05/22/2009  . COSTOCHONDRITIS 04/28/2009  . HYPERLIPIDEMIA 04/12/2009  . DEPRESSION 04/12/2009  . Essential hypertension 04/12/2009  . ALLERGIC RHINITIS 04/12/2009  . ASTHMA 04/12/2009  . GERD 04/12/2009  . URINARY INCONTINENCE 04/12/2009    Earlie Counts, PT 08/02/17 12:30 PM    [facility] [address], [place], [place], [address] Phone: [phone]   Fax:  [phone]  Name: Maria Schneider MRN: [id number] Date of Birth: 07-07-1945

## 2017-08-06 ENCOUNTER — Ambulatory Visit: Payer: Medicare Other | Admitting: Physical Therapy

## 2017-08-06 ENCOUNTER — Encounter: Payer: Self-pay | Admitting: Physical Therapy

## 2017-08-06 DIAGNOSIS — M546 Pain in thoracic spine: Secondary | ICD-10-CM | POA: Diagnosis not present

## 2017-08-06 NOTE — Therapy (Signed)
[facility] [address], [place], [place], [address] Phone: [phone]   Fax:  [phone]  Physical Therapy Treatment  Patient Details  Name: Maria Schneider MRN: [id number] Date of Birth: Aug 03, 1945 Referring Provider: Laurey Morale, MD   Encounter Date: 08/06/2017  PT End of Session - 08/06/17 1224    Visit Number  5 updated to reflect accurate visit number    Number of Visits  6    Date for PT Re-Evaluation  08/13/17    Authorization Type  [employer]HC Medicare    PT Start Time  1145    PT Stop Time  1221    PT Time Calculation (min)  36 min    Activity Tolerance  Patient tolerated treatment well    Behavior During Therapy  [facility] for tasks assessed/performed       Past Medical History:  Diagnosis Date  . Allergy   . Arthritis   . Asthma   . Cancer ([place])    hx skin cancer  . Depression   . Detrusor instability   . Diverticulosis    not symptomatic  . GERD (gastroesophageal reflux disease)   . History of kidney stones   . History of pancreatitis    April 2015  . History of skin cancer   . Hyperlipidemia   . Hypertension   . Osteopenia   . Urinary incontinence   . Vitamin D deficiency     Past Surgical History:  Procedure Laterality Date  . ABDOMINAL HYSTERECTOMY  1994   TAH/ BLADDER SUSPENSION  . BREAST BIOPSY  1972   benign  . CHOLECYSTECTOMY N/A 07/12/2013   Procedure: LAPAROSCOPIC CHOLECYSTECTOMY WITH INTRAOPERATIVE CHOLANGIOGRAM;  Surgeon: Edward Jolly, MD;  Location: [facility];  Service: General;  Laterality: N/A;  . COLONOSCOPY  06-24-12   per Dr. Earlean Shawl, benign polyps, repeat in 5 yrs    . ERCP N/A 07/14/2013   Procedure: ENDOSCOPIC RETROGRADE CHOLANGIOPANCREATOGRAPHY (ERCP);  Surgeon: Gatha Mayer, MD;  [facility];  Service: Endoscopy;  Laterality: N/A;  . TONSILLECTOMY    . TOTAL HIP ARTHROPLASTY Right 01/13/2015   Procedure: RIGHT TOTAL HIP ARTHROPLASTY ANTERIOR APPROACH;   Surgeon: Gaynelle Arabian, MD;  Location: [facility];  Service: Orthopedics;  Laterality: Right;  . TUBAL LIGATION      There were no vitals filed for this visit.  Subjective Assessment - 08/06/17 1149    Subjective  still having some soreness; but it's better.  doing more activities; and feels exercises are helping    Limitations  Sitting;House hold activities    How long can you sit comfortably?  keeps adjusting, 30 min    Patient Stated Goals  be able to travel to [place] (going 5/26), decrease pain    Currently in Pain?  Yes    Pain Score  2     Pain Location  Flank    Pain Orientation  Right    Pain Descriptors / Indicators  Aching;Sore    Pain Type  Acute pain    Pain Onset  More than a month ago    Pain Frequency  Intermittent    Aggravating Factors   twisting    Pain Relieving Factors  heating pad, exercises help                       [facility] Adult PT Treatment/Exercise - 08/06/17 1151      Lumbar Exercises: Stretches   Quadruped Mid Back  Stretch  3 reps;20 seconds mid and to Lt (for Rt side stretch), seated with ball roll      Lumbar Exercises: Aerobic   UBE (Upper Arm Bike)  L2 x 6 min (3' fwd/3' bwd)      Manual Therapy   Manual Therapy  Soft tissue mobilization    Manual therapy comments  skilled palpation of soft tissue during DN    Soft tissue mobilization  IASTM Rt QL and thoracic/lumbar paraspinals       Trigger Point Dry Needling - 08/06/17 1222    Consent Given?  Yes    Muscles Treated Upper Body  Longissimus    Longissimus Response  Twitch response elicited;Palpable increased muscle length lower thoracic/lumbar paraspinals; lower lats                PT Long Term Goals - 07/23/17 1405      PT LONG TERM GOAL #1   Title  independent with HEP    Status  New    Target Date  08/13/17      PT LONG TERM GOAL #2   Title  perform thoracic/lumbar ROM without increase in pain for improved function    Status  New    Target Date  08/13/17       PT LONG TERM GOAL #3   Title  report ability to sit > 1 hour without increase in pain in preparation for long flight    Status  New    Target Date  08/13/17            Plan - 08/06/17 1224    Clinical Impression Statement  Pt with good twitch responses to lower lats with DN and tolerated session well today.  Pt leaving this weekend for [place] so today's session and next session focused on pain management and recommendations for flying/trip to help with pain.      Rehab Potential  Good    PT Frequency  2x / week    PT Duration  3 weeks    PT Treatment/Interventions  ADLs/Self Care Home Management;Cryotherapy;Electrical Stimulation;Moist Heat;Therapeutic exercise;Therapeutic activities;Functional mobility training;Neuromuscular re-education;Ultrasound;Patient/family education;Manual techniques;Taping;Dry needling    PT Next Visit Plan   DN to right gluteals  and lumbar/thoracic paraspinals PRN, core/back strengthening exercises to decrease reinjury risk; right gluteal strengthening    PT Home Exercise Plan  Access Code: [email or web address]     Consulted and Agree with Plan of Care  Patient       Patient will benefit from skilled therapeutic intervention in order to improve the following deficits and impairments:  Increased fascial restricitons, Increased muscle spasms, Pain  Visit Diagnosis: Pain in thoracic spine     Problem List Patient Active Problem List   Diagnosis Date Noted  . OA (osteoarthritis) of hip 01/13/2015  . B12 deficiency 01/07/2014  . Bradycardia 01/07/2014  . Abnormal cholangiogram - CBD stones suspected 07/14/2013  . Gall stone pancreatitis 07/09/2013  . PYELONEPHRITIS, ACUTE 05/22/2009  . COSTOCHONDRITIS 04/28/2009  . HYPERLIPIDEMIA 04/12/2009  . DEPRESSION 04/12/2009  . Essential hypertension 04/12/2009  . ALLERGIC RHINITIS 04/12/2009  . ASTHMA 04/12/2009  . GERD 04/12/2009  . URINARY INCONTINENCE 04/12/2009      Laureen Abrahams, PT, DPT 08/06/17  12:26 PM    [facility] [address], [place], [place], [address] Phone: [phone]   Fax:  [phone]  Name: Maria Schneider MRN: [id number] Date of Birth: 1946/03/20

## 2017-08-08 ENCOUNTER — Ambulatory Visit: Payer: Medicare Other | Admitting: Physical Therapy

## 2017-08-08 ENCOUNTER — Encounter: Payer: Self-pay | Admitting: Physical Therapy

## 2017-08-08 DIAGNOSIS — M546 Pain in thoracic spine: Secondary | ICD-10-CM | POA: Diagnosis not present

## 2017-08-08 NOTE — Therapy (Addendum)
[facility] [address], [address] [place], [place], [address] Phone: [phone]   Fax:  [phone]  Physical Therapy Treatment/Recertification/Discharge Summary  Patient Details  Name: Maria Schneider MRN: [id number] Date of Birth: 10/24/1945 Referring Provider: Fry, Stephen A, MD   Encounter Date: 08/08/2017  PT End of Session - 08/08/17 1211    Visit Number  6 updated to reflect accurate visit number    Number of Visits  10    Date for PT Re-Evaluation  09/05/17    Authorization Type  [employer]HC Medicare    PT Start Time  1145    PT Stop Time  1218    PT Time Calculation (min)  33 min    Activity Tolerance  Patient tolerated treatment well    Behavior During Therapy  [facility] for tasks assessed/performed       Past Medical History:  Diagnosis Date  . Allergy   . Arthritis   . Asthma   . Cancer ([facility])    hx skin cancer  . Depression   . Detrusor instability   . Diverticulosis    not symptomatic  . GERD (gastroesophageal reflux disease)   . History of kidney stones   . History of pancreatitis    April 2015  . History of skin cancer   . Hyperlipidemia   . Hypertension   . Osteopenia   . Urinary incontinence   . Vitamin D deficiency     Past Surgical History:  Procedure Laterality Date  . ABDOMINAL HYSTERECTOMY  1994   TAH/ BLADDER SUSPENSION  . BREAST BIOPSY  1972   benign  . CHOLECYSTECTOMY N/A 07/12/2013   Procedure: LAPAROSCOPIC CHOLECYSTECTOMY WITH INTRAOPERATIVE CHOLANGIOGRAM;  Surgeon: Benjamin T Hoxworth, MD;  [facility];  Service: General;  Laterality: N/A;  . COLONOSCOPY  06-24-12   per Dr. Medoff, benign polyps, repeat in 5 yrs    . ERCP N/A 07/14/2013   Procedure: ENDOSCOPIC RETROGRADE CHOLANGIOPANCREATOGRAPHY (ERCP);  Surgeon: Carl E Gessner, MD;  [facility];  Service: Endoscopy;  Laterality: N/A;  . TONSILLECTOMY    . TOTAL HIP ARTHROPLASTY Right 01/13/2015   Procedure: RIGHT TOTAL HIP  ARTHROPLASTY ANTERIOR APPROACH;  Surgeon: Frank Aluisio, MD;  [facility];  Service: Orthopedics;  Laterality: Right;  . TUBAL LIGATION      There were no vitals filed for this visit.  Subjective Assessment - 08/08/17 1147    Subjective  sore yesterday, but much better today    How long can you sit comfortably?  1 hour    Patient Stated Goals  be able to travel to [place] (going 5/26), decrease pain    Currently in Pain?  No/denies                       [facility] Adult PT Treatment/Exercise - 08/08/17 1153      Lumbar Exercises: Stretches   Quadruped Mid Back Stretch  3 reps;20 seconds mid and to Lt (for Rt side stretch), seated with ball roll    Other Lumbar Stretch Exercise  overhead lat reach 3x30 sec      Lumbar Exercises: Aerobic   Nustep  L3 x 8 min      Moist Heat Therapy   Number Minutes Moist Heat  10 Minutes    Moist Heat Location  Lumbar Spine Rt side                  PT Long Term Goals -   08/08/17 1212      PT LONG TERM GOAL #1   Title  independent with HEP    Status  New    Target Date  09/05/17      PT LONG TERM GOAL #2   Title  perform thoracic/lumbar ROM without increase in pain for improved function    Status  New    Target Date  09/05/17      PT LONG TERM GOAL #3   Title  report ability to sit > 1 hour without increase in pain in preparation for long flight    Status  Achieved            Plan - 08/08/17 1212    Clinical Impression Statement  Pt has responded well to PT and at this time pt going to [place] next week.  Pt has met LTG #3 and will continue other goals.  Pt wants to hold PT, but will call upon returning from trip if needed.    Rehab Potential  Good    PT Frequency  2x / week    PT Duration  3 weeks    PT Treatment/Interventions  ADLs/Self Care Home Management;Cryotherapy;Electrical Stimulation;Moist Heat;Therapeutic exercise;Therapeutic activities;Functional mobility training;Neuromuscular  re-education;Ultrasound;Patient/family education;Manual techniques;Taping;Dry needling    PT Next Visit Plan  hold PT (pt will call if needed upon returning from trip), reassess or d/c    PT Home Exercise Plan  Access Code: [email or web address]     Consulted and Agree with Plan of Care  Patient       Patient will benefit from skilled therapeutic intervention in order to improve the following deficits and impairments:  Increased fascial restricitons, Increased muscle spasms, Pain  Visit Diagnosis: Pain in thoracic spine - Plan: PT plan of care cert/re-cert    PHYSICAL THERAPY DISCHARGE SUMMARY  Visits from Start of Care: 6  Current functional level related to goals / functional outcomes: The patient responded well to PT including manual therapy, dry needling and a comprehensive HEP.  She was put on hold from PT while she traveled to [place].  It was agreed upon that she would call to when she returned if problems arose.  She has not made contact since 08/08/17.  Will discharge from PT at this time.     Remaining deficits: As above   Education / Equipment: Comprehensive HEP Plan: Patient agrees to discharge.  Patient goals were partially met. Patient is being discharged due to meeting the stated rehab goals.  ?????         Problem List Patient Active Problem List   Diagnosis Date Noted  . OA (osteoarthritis) of hip 01/13/2015  . B12 deficiency 01/07/2014  . Bradycardia 01/07/2014  . Abnormal cholangiogram - CBD stones suspected 07/14/2013  . Gall stone pancreatitis 07/09/2013  . PYELONEPHRITIS, ACUTE 05/22/2009  . COSTOCHONDRITIS 04/28/2009  . HYPERLIPIDEMIA 04/12/2009  . DEPRESSION 04/12/2009  . Essential hypertension 04/12/2009  . ALLERGIC RHINITIS 04/12/2009  . ASTHMA 04/12/2009  . GERD 04/12/2009  . URINARY INCONTINENCE 04/12/2009     Ruben Im, PT 10/19/17 8:36 AM Phone: [phone] Fax: [phone]  Laureen Abrahams, PT, DPT 08/08/17 12:16 PM'     [facility] [address], [place], [place], [address] Phone: [phone]   Fax:  [phone]  Name: Maria Schneider MRN: [id number] Date of Birth: Feb 07, 1946

## 2017-09-07 ENCOUNTER — Other Ambulatory Visit: Payer: Self-pay | Admitting: Family Medicine

## 2017-09-13 ENCOUNTER — Telehealth: Payer: Self-pay

## 2017-09-13 NOTE — Telephone Encounter (Signed)
Fax came in for   Preoperative clearance form   Placed in Dr. [facility] folder

## 2017-09-14 LAB — HM MAMMOGRAPHY

## 2017-09-17 NOTE — Telephone Encounter (Signed)
The form is ready  

## 2017-09-18 NOTE — Telephone Encounter (Signed)
Called and spoke with pt. Form has been placed to be faxed, placed to be scanned into pt's chart, and placed up front for pick up.

## 2017-10-16 NOTE — H&P (Signed)
TOTAL HIP ADMISSION H&P  Patient is admitted for left total hip arthroplasty.  Subjective:  Chief Complaint: left hip pain  HPI: Maria Schneider, 72 y.o. female, has a history of pain and functional disability in the left hip(s) due to arthritis and patient has failed non-surgical conservative treatments for greater than 12 weeks to include NSAID's and/or analgesics, corticosteriod injections, use of assistive devices and activity modification.  Onset of symptoms was abrupt starting less than six months ago with gradually worsening course since that time.The patient noted no past surgery on the left hip(s).  Patient currently rates pain in the left hip at 9 out of 10 with activity. Patient has night pain, worsening of pain with activity and weight bearing, pain that interfers with activities of daily living and pain with passive range of motion. Patient has evidence of a large effusion and significant edema in the femoral head by MRI. She is now bone-on-bone in the posterior aspect of the hip. In comparison to her plain films which just showed mild joint-space narrowing; she has a rapidly progressive arthritis. This condition presents safety issues increasing the risk of falls. There is no current active infection.  Patient Active Problem List   Diagnosis Date Noted  . OA (osteoarthritis) of hip 01/13/2015  . B12 deficiency 01/07/2014  . Bradycardia 01/07/2014  . Abnormal cholangiogram - CBD stones suspected 07/14/2013  . Gall stone pancreatitis 07/09/2013  . PYELONEPHRITIS, ACUTE 05/22/2009  . COSTOCHONDRITIS 04/28/2009  . HYPERLIPIDEMIA 04/12/2009  . DEPRESSION 04/12/2009  . Essential hypertension 04/12/2009  . ALLERGIC RHINITIS 04/12/2009  . ASTHMA 04/12/2009  . GERD 04/12/2009  . URINARY INCONTINENCE 04/12/2009   Past Medical History:  Diagnosis Date  . Allergy   . Arthritis   . Asthma   . Cancer ([place])    hx skin cancer  . Depression   . Detrusor instability   . Diverticulosis     not symptomatic  . GERD (gastroesophageal reflux disease)   . History of kidney stones   . History of pancreatitis    April 2015  . History of skin cancer   . Hyperlipidemia   . Hypertension   . Osteopenia   . Urinary incontinence   . Vitamin D deficiency     Past Surgical History:  Procedure Laterality Date  . ABDOMINAL HYSTERECTOMY  1994   TAH/ BLADDER SUSPENSION  . BREAST BIOPSY  1972   benign  . CHOLECYSTECTOMY N/A 07/12/2013   Procedure: LAPAROSCOPIC CHOLECYSTECTOMY WITH INTRAOPERATIVE CHOLANGIOGRAM;  Surgeon: Edward Jolly, MD;  [facility];  Service: General;  Laterality: N/A;  . COLONOSCOPY  06-24-12   per Dr. Earlean Shawl, benign polyps, repeat in 5 yrs    . ERCP N/A 07/14/2013   Procedure: ENDOSCOPIC RETROGRADE CHOLANGIOPANCREATOGRAPHY (ERCP);  Surgeon: Gatha Mayer, MD;  [facility];  Service: Endoscopy;  Laterality: N/A;  . TONSILLECTOMY    . TOTAL HIP ARTHROPLASTY Right 01/13/2015   Procedure: RIGHT TOTAL HIP ARTHROPLASTY ANTERIOR APPROACH;  Surgeon: Gaynelle Arabian, MD;  [facility]: [facility];  Service: Orthopedics;  Laterality: Right;  . TUBAL LIGATION      No current facility-administered medications for this encounter.    Current Outpatient Medications  Medication Sig Dispense Refill Last Dose  . Aspirin-Calcium Carbonate 81-300 MG TABS Take 1 tablet by mouth daily. Reported on 03/05/2015   Taking  . buPROPion (WELLBUTRIN XL) 300 MG 24 hr tablet Take 300 mg by mouth daily.     Taking  . Cholecalciferol (VITAMIN D  PO) Take 5,000 Units by mouth daily. Reported on 06/09/2015   Taking  . esomeprazole (NEXIUM) 20 MG capsule Take 20 mg by mouth daily.    Taking  . ezetimibe (ZETIA) 10 MG tablet Take 1 tablet (10 mg total) by mouth daily. 90 tablet 3 Taking  . fenofibrate 160 MG tablet TAKE 1 TABLET BY MOUTH EVERY DAY 90 tablet 2 Taking  . losartan (COZAAR) 50 MG tablet TAKE 1 TABLET BY MOUTH EVERY DAY 30 tablet 11   . metoprolol succinate (TOPROL-XL) 25 MG  24 hr tablet TAKE 1 TABLET (25 MG TOTAL) BY MOUTH DAILY. 90 tablet 2 Taking  . MYRBETRIQ 50 MG TB24 tablet Take 50 mg by mouth daily.  11 Taking  . sertraline (ZOLOFT) 100 MG tablet Take 200 mg by mouth daily.    Taking  . topiramate (TOPAMAX) 25 MG tablet Take 25 mg by mouth daily.   Not Taking   Allergies  Allergen Reactions  . Ace Inhibitors     REACTION: cough  . Codeine Nausea And Vomiting  . Fentanyl Itching  . Statins     REACTION: MYALGIAS-muscle pain    Social History   Tobacco Use  . Smoking status: Former Smoker    Years: 42.00    Types: Cigarettes    Last attempt to quit: 07/09/2008    Years since quitting: 9.2  . Smokeless tobacco: Never Used  . Tobacco comment: quit 6 months ago  Substance Use Topics  . Alcohol use: Yes    Alcohol/week: 1.2 oz    Types: 2 Glasses of wine per week    Comment: occ    Family History  Problem Relation Age of Onset  . Hypertension Mother   . Osteoporosis Mother   . Heart disease Father   . Osteoporosis Sister   . Breast cancer Paternal Aunt        Age 86's  . Diabetes Paternal Uncle      Review of Systems  Constitutional: Negative for chills and fever.  HENT: Negative for congestion, sore throat and tinnitus.   Eyes: Negative for double vision, photophobia and pain.  Respiratory: Negative for cough, shortness of breath and wheezing.   Cardiovascular: Negative for chest pain, palpitations and orthopnea.  Gastrointestinal: Negative for heartburn, nausea and vomiting.  Genitourinary: Negative for dysuria, frequency and urgency.  Musculoskeletal: Positive for joint pain.  Neurological: Negative for dizziness, weakness and headaches.  Psychiatric/Behavioral: Negative for depression.    Objective:  Physical Exam  Well nourished and well developed. General: Alert and oriented x3, cooperative and pleasant, no acute distress. Head: normocephalic, atraumatic, neck supple. Eyes: EOMI. Respiratory: breath sounds clear in all  fields, no wheezing, rales, or rhonchi. Cardiovascular: Regular rate and rhythm, no murmurs, gallops or rubs.  Abdomen: non-tender to palpation and soft, normoactive bowel sounds. Musculoskeletal: Antalgic gait with a walker Left Hip Exam: ROM: Flexion to 110, Internal Rotation is minimal, External Rotation 20, and abduction 20 without discomfort with pain. There is no tenderness over the greater trochanter. There is no pain on provocative testing of the hip. Calves soft and nontender. Motor function intact in LE. Strength 5/5 LE bilaterally. Neuro: Distal pulses 2+. Sensation to light touch intact in LE.  Vital signs in last 24 hours: Blood pressure: 130/86 mmHg Pulse: 76 bpm  Labs:   Estimated body mass index is 27.69 kg/m as calculated from the following:   Height as of 07/17/17: 5\' 5"  (1.651 m).   Weight as of 07/17/17: 75.5  kg (166 lb 6.4 oz).   Imaging Review Plain radiographs demonstrate severe degenerative joint disease of the left hip(s). The bone quality appears to be adequate for age and reported activity level.    Preoperative templating of the joint replacement has been completed, documented, and submitted to the Operating Room personnel in order to optimize intra-operative equipment management.     Assessment/Plan:  End stage arthritis, left hip(s)  The patient history, physical examination, clinical judgement of the provider and imaging studies are consistent with end stage degenerative joint disease of the left hip(s) and total hip arthroplasty is deemed medically necessary. The treatment options including medical management, injection therapy, arthroscopy and arthroplasty were discussed at length. The risks and benefits of total hip arthroplasty were presented and reviewed. The risks due to aseptic loosening, infection, stiffness, dislocation/subluxation,  thromboembolic complications and other imponderables were discussed.  The patient acknowledged the explanation,  agreed to proceed with the plan and consent was signed. Patient is being admitted for inpatient treatment for surgery, pain control, PT, OT, prophylactic antibiotics, VTE prophylaxis, progressive ambulation and ADL's and discharge planning.The patient is planning to be discharged home with home health services.  Therapy Plans: HHPT Disposition: Home with husband Planned DVT Prophylaxis: aspirin 325mg  BID DME needed: None PCP: Dr. [name] (medical clearance provided) TXA: IV Allergies: codeine (vomiting)  - Patient was instructed on what medications to stop prior to surgery. - Follow-up visit in 2 weeks with Dr. Wynelle Link - Begin physical therapy following surgery - Pre-operative lab work as pre-surgical testing - Prescriptions will be provided in hospital at time of discharge  Theresa Duty, PA-C Orthopedic Surgery [facility] [place]

## 2017-10-30 NOTE — Progress Notes (Addendum)
CXR 05-12-17 Epic  CLEARANCE DR Sarajane Jews ON CHART

## 2017-10-30 NOTE — Patient Instructions (Addendum)
Maria Schneider  10/30/2017   Your procedure is scheduled on: 11-07-17   Report to [facility] Main  Entrance    Report to admitting at 7:30AM    Call this number if you have problems the morning of surgery [phone]     Remember: Do not eat food or drink liquids :After Midnight.   PLEASE REMEMBER TO BRING  YOUR LIVING WILL TO [place] THE DAY OF SURGERY      Take these medicines the morning of surgery with A SIP OF WATER:  BUPROPION, ESOMEPRAZOLE, EZETIMIBE, FENOFIBRATE, METOPROLOL, SERTRALINE                                 You may not have any metal on your body including hair pins and              piercings  Do not wear jewelry, make-up, lotions, powders or perfumes, deodorant             Do not wear nail polish.  Do not shave  48 hours prior to surgery.             Do not bring valuables to the hospital. [place].  Contacts, dentures or bridgework may not be worn into surgery.  Leave suitcase in the car. After surgery it may be brought to your room.                  Please read over the following fact sheets you were given: _____________________________________________________________________             [facility] - Preparing for Surgery Before surgery, you can play an important role.  Because skin is not sterile, your skin needs to be as free of germs as possible.  You can reduce the number of germs on your skin by washing with CHG (chlorahexidine gluconate) soap before surgery.  CHG is an antiseptic cleaner which kills germs and bonds with the skin to continue killing germs even after washing. Please DO NOT use if you have an allergy to CHG or antibacterial soaps.  If your skin becomes reddened/irritated stop using the CHG and inform your nurse when you arrive at Short Stay. Do not shave (including legs and underarms) for at least 48 hours prior to the first CHG shower.  You may shave  your face/neck. Please follow these instructions carefully:  1.  Shower with CHG Soap the night before surgery and the  morning of Surgery.  2.  If you choose to wash your hair, wash your hair first as usual with your  normal  shampoo.  3.  After you shampoo, rinse your hair and body thoroughly to remove the  shampoo.                           4.  Use CHG as you would any other liquid soap.  You can apply chg directly  to the skin and wash                       Gently with a scrungie or clean washcloth.  5.  Apply the CHG Soap to your body ONLY FROM  THE NECK DOWN.   Do not use on face/ open                           Wound or open sores. Avoid contact with eyes, ears mouth and genitals (private parts).                       Wash face,  Genitals (private parts) with your normal soap.             6.  Wash thoroughly, paying special attention to the area where your surgery  will be performed.  7.  Thoroughly rinse your body with warm water from the neck down.  8.  DO NOT shower/wash with your normal soap after using and rinsing off  the CHG Soap.                9.  Pat yourself dry with a clean towel.            10.  Wear clean pajamas.            11.  Place clean sheets on your bed the night of your first shower and do not  sleep with pets. Day of Surgery : Do not apply any lotions/deodorants the morning of surgery.  Please wear clean clothes to the hospital/surgery center.  FAILURE TO FOLLOW THESE INSTRUCTIONS MAY RESULT IN THE CANCELLATION OF YOUR SURGERY PATIENT SIGNATURE_________________________________  NURSE SIGNATURE__________________________________  ________________________________________________________________________   Adam Phenix  An incentive spirometer is a tool that can help keep your lungs clear and active. This tool measures how well you are filling your lungs with each breath. Taking long deep breaths may help reverse or decrease the chance of developing  breathing (pulmonary) problems (especially infection) following:  A long period of time when you are unable to move or be active. BEFORE THE PROCEDURE   If the spirometer includes an indicator to show your best effort, your nurse or respiratory therapist will set it to a desired goal.  If possible, sit up straight or lean slightly forward. Try not to slouch.  Hold the incentive spirometer in an upright position. INSTRUCTIONS FOR USE  1. Sit on the edge of your bed if possible, or sit up as far as you can in bed or on a chair. 2. Hold the incentive spirometer in an upright position. 3. Breathe out normally. 4. Place the mouthpiece in your mouth and seal your lips tightly around it. 5. Breathe in slowly and as deeply as possible, raising the piston or the ball toward the top of the column. 6. Hold your breath for 3-5 seconds or for as long as possible. Allow the piston or ball to fall to the bottom of the column. 7. Remove the mouthpiece from your mouth and breathe out normally. 8. Rest for a few seconds and repeat Steps 1 through 7 at least 10 times every 1-2 hours when you are awake. Take your time and take a few normal breaths between deep breaths. 9. The spirometer may include an indicator to show your best effort. Use the indicator as a goal to work toward during each repetition. 10. After each set of 10 deep breaths, practice coughing to be sure your lungs are clear. If you have an incision (the cut made at the time of surgery), support your incision when coughing by placing a pillow or rolled up towels firmly against it. Once you are  able to get out of bed, walk around indoors and cough well. You may stop using the incentive spirometer when instructed by your caregiver.  RISKS AND COMPLICATIONS  Take your time so you do not get dizzy or light-headed.  If you are in pain, you may need to take or ask for pain medication before doing incentive spirometry. It is harder to take a deep  breath if you are having pain. AFTER USE  Rest and breathe slowly and easily.  It can be helpful to keep track of a log of your progress. Your caregiver can provide you with a simple table to help with this. If you are using the spirometer at home, follow these instructions: [place] IF:   You are having difficultly using the spirometer.  You have trouble using the spirometer as often as instructed.  Your pain medication is not giving enough relief while using the spirometer.  You develop fever of 100.5 F (38.1 C) or higher. SEEK IMMEDIATE MEDICAL CARE IF:   You cough up bloody sputum that had not been present before.  You develop fever of 102 F (38.9 C) or greater.  You develop worsening pain at or near the incision site. MAKE SURE YOU:   Understand these instructions.  Will watch your condition.  Will get help right away if you are not doing well or get worse. Document Released: 07/17/2006 Document Revised: 05/29/2011 Document Reviewed: 09/17/2006 Exit[employer] Patient Information 2014 [employer], [place].   ________________________________________________________________________  WHAT IS A BLOOD TRANSFUSION? Blood Transfusion Information  A transfusion is the replacement of blood or some of its parts. Blood is made up of multiple cells which provide different functions.  Red blood cells carry oxygen and are used for blood loss replacement.  White blood cells fight against infection.  Platelets control bleeding.  Plasma helps clot blood.  Other blood products are available for specialized needs, such as hemophilia or other clotting disorders. BEFORE THE TRANSFUSION  Who gives blood for transfusions?   Healthy volunteers who are fully evaluated to make sure their blood is safe. This is blood bank blood. Transfusion therapy is the safest it has ever been in the practice of medicine. Before blood is taken from a donor, a complete history is taken to make sure  that person has no history of diseases nor engages in risky social behavior (examples are intravenous drug use or sexual activity with multiple partners). The donor's travel history is screened to minimize risk of transmitting infections, such as malaria. The donated blood is tested for signs of infectious diseases, such as HIV and hepatitis. The blood is then tested to be sure it is compatible with you in order to minimize the chance of a transfusion reaction. If you or a relative donates blood, this is often done in anticipation of surgery and is not appropriate for emergency situations. It takes many days to process the donated blood. RISKS AND COMPLICATIONS Although transfusion therapy is very safe and saves many lives, the main dangers of transfusion include:   Getting an infectious disease.  Developing a transfusion reaction. This is an allergic reaction to something in the blood you were given. Every precaution is taken to prevent this. The decision to have a blood transfusion has been considered carefully by your caregiver before blood is given. Blood is not given unless the benefits outweigh the risks. AFTER THE TRANSFUSION  Right after receiving a blood transfusion, you will usually feel much better and more energetic. This is especially  true if your red blood cells have gotten low (anemic). The transfusion raises the level of the red blood cells which carry oxygen, and this usually causes an energy increase.  The nurse administering the transfusion will monitor you carefully for complications. HOME CARE INSTRUCTIONS  No special instructions are needed after a transfusion. You may find your energy is better. Speak with your caregiver about any limitations on activity for underlying diseases you may have. SEEK MEDICAL CARE IF:   Your condition is not improving after your transfusion.  You develop redness or irritation at the intravenous (IV) site. SEEK IMMEDIATE MEDICAL CARE IF:  Any of  the following symptoms occur over the next 12 hours:  Shaking chills.  You have a temperature by mouth above 102 F (38.9 C), not controlled by medicine.  Chest, back, or muscle pain.  People around you feel you are not acting correctly or are confused.  Shortness of breath or difficulty breathing.  Dizziness and fainting.  You get a rash or develop hives.  You have a decrease in urine output.  Your urine turns a dark color or changes to pink, red, or brown. Any of the following symptoms occur over the next 10 days:  You have a temperature by mouth above 102 F (38.9 C), not controlled by medicine.  Shortness of breath.  Weakness after normal activity.  The white part of the eye turns yellow (jaundice).  You have a decrease in the amount of urine or are urinating less often.  Your urine turns a dark color or changes to pink, red, or brown. Document Released: 03/03/2000 Document Revised: 05/29/2011 Document Reviewed: 10/21/2007 [facility] Patient Information 2014 [place], [place].  _______________________________________________________________________

## 2017-10-31 ENCOUNTER — Other Ambulatory Visit: Payer: Self-pay

## 2017-10-31 ENCOUNTER — Encounter (HOSPITAL_COMMUNITY): Payer: Self-pay

## 2017-10-31 ENCOUNTER — Encounter (HOSPITAL_COMMUNITY)
Admission: RE | Admit: 2017-10-31 | Discharge: 2017-10-31 | Disposition: A | Discharge status: Home / Self Care | Payer: Medicare Other | Source: Ambulatory Visit | Attending: Orthopedic Surgery | Admitting: Orthopedic Surgery

## 2017-10-31 DIAGNOSIS — Z01818 Encounter for other preprocedural examination: Secondary | ICD-10-CM | POA: Insufficient documentation

## 2017-10-31 DIAGNOSIS — M1612 Unilateral primary osteoarthritis, left hip: Secondary | ICD-10-CM | POA: Diagnosis not present

## 2017-10-31 DIAGNOSIS — Z0183 Encounter for blood typing: Secondary | ICD-10-CM | POA: Insufficient documentation

## 2017-10-31 DIAGNOSIS — Z01812 Encounter for preprocedural laboratory examination: Secondary | ICD-10-CM | POA: Diagnosis not present

## 2017-10-31 DIAGNOSIS — I10 Essential (primary) hypertension: Secondary | ICD-10-CM | POA: Diagnosis not present

## 2017-10-31 LAB — COMPREHENSIVE METABOLIC PANEL
ALT: 17 U/L (ref 0–44)
AST: 20 U/L (ref 15–41)
Albumin: 4.4 g/dL (ref 3.5–5.0)
Alkaline Phosphatase: 58 U/L (ref 38–126)
Anion gap: 9 (ref 5–15)
BILIRUBIN TOTAL: 0.8 mg/dL (ref 0.3–1.2)
BUN: 16 mg/dL (ref 8–23)
CO2: 29 mmol/L (ref 22–32)
Calcium: 9.9 mg/dL (ref 8.9–10.3)
Chloride: 104 mmol/L (ref 98–111)
Creatinine, Ser: 1.01 mg/dL — ABNORMAL HIGH (ref 0.44–1.00)
GFR calc Af Amer: >60 mL/min (ref >60)
GFR, EST NON AFRICAN AMERICAN: 55 mL/min — AB (ref >60)
Glucose, Bld: 87 mg/dL (ref 70–99)
Potassium: 5.1 mmol/L (ref 3.5–5.1)
Sodium: 142 mmol/L (ref 135–145)
TOTAL PROTEIN: 7.1 g/dL (ref 6.5–8.1)

## 2017-10-31 LAB — APTT: aPTT: 35 seconds (ref 24–36)

## 2017-10-31 LAB — CBC
HEMATOCRIT: 37.6 % (ref 36.0–46.0)
Hemoglobin: 12.3 g/dL (ref 12.0–15.0)
MCH: 28.1 pg (ref 26.0–34.0)
MCHC: 32.7 g/dL (ref 30.0–36.0)
MCV: 86 fL (ref 78.0–100.0)
Platelets: 185 10*3/uL (ref 150–400)
RBC: 4.37 MIL/uL (ref 3.87–5.11)
RDW: 13.6 % (ref 11.5–15.5)
WBC: 4.3 10*3/uL (ref 4.0–10.5)

## 2017-10-31 LAB — SURGICAL PCR SCREEN
MRSA, PCR: NEGATIVE
Staphylococcus aureus: NEGATIVE

## 2017-10-31 LAB — PROTIME-INR
INR: 1.03
PROTHROMBIN TIME: 13.4 s (ref 11.4–15.2)

## 2017-11-02 ENCOUNTER — Other Ambulatory Visit: Payer: Self-pay | Admitting: Family Medicine

## 2017-11-06 NOTE — Anesthesia Preprocedure Evaluation (Addendum)
Anesthesia Evaluation  Patient identified by MRN, date of birth, ID band  Reviewed: Allergy & Precautions, NPO status , Patient's Chart, lab work & pertinent test results  Airway Mallampati: I  TM Distance: >3 FB Neck ROM: Full    Dental no notable dental hx. (+) Partial Upper, Dental Advisory Given, Loose,    Pulmonary asthma , former smoker,    Pulmonary exam normal breath sounds clear to auscultation       Cardiovascular hypertension, Pt. on medications and Pt. on home beta blockers Normal cardiovascular exam Rhythm:Regular Rate:Normal     Neuro/Psych Depression negative neurological ROS     GI/Hepatic Neg liver ROS, GERD  Medicated and Controlled,  Endo/Other  negative endocrine ROS  Renal/GU negative Renal ROS     Musculoskeletal  (+) Arthritis , Osteoarthritis,    Abdominal   Peds  Hematology negative hematology ROS (+)   Anesthesia Other Findings Day of surgery medications reviewed with the patient.  Reproductive/Obstetrics                            Anesthesia Physical Anesthesia Plan  ASA: II  Anesthesia Plan: Spinal   Post-op Pain Management:    Induction: Intravenous  PONV Risk Score and Plan: 2 and Propofol infusion, Dexamethasone and Ondansetron  Airway Management Planned: Natural Airway and Simple Face Mask  Additional Equipment:   Intra-op Plan:   Post-operative Plan: Extubation in OR  Informed Consent: I have reviewed the patients History and Physical, chart, labs and discussed the procedure including the risks, benefits and alternatives for the proposed anesthesia with the patient or authorized representative who has indicated his/her understanding and acceptance.   Dental advisory given  Plan Discussed with: CRNA  Anesthesia Plan Comments:         Anesthesia Quick Evaluation

## 2017-11-07 ENCOUNTER — Inpatient Hospital Stay (HOSPITAL_COMMUNITY): Payer: Medicare Other | Admitting: Anesthesiology

## 2017-11-07 ENCOUNTER — Encounter (HOSPITAL_COMMUNITY): Payer: Self-pay | Admitting: *Deleted

## 2017-11-07 ENCOUNTER — Inpatient Hospital Stay (HOSPITAL_COMMUNITY)
Admission: RE | Admit: 2017-11-07 | Discharge: 2017-11-09 | DRG: 470 | Disposition: A | Discharge status: Home Health | Payer: Medicare Other | Attending: Orthopedic Surgery | Admitting: Orthopedic Surgery

## 2017-11-07 ENCOUNTER — Encounter (HOSPITAL_COMMUNITY)
Admission: RE | Disposition: A | Discharge status: Home Health | Payer: Self-pay | Source: Home / Self Care | Attending: Orthopedic Surgery

## 2017-11-07 ENCOUNTER — Inpatient Hospital Stay (HOSPITAL_COMMUNITY): Payer: Medicare Other

## 2017-11-07 ENCOUNTER — Other Ambulatory Visit: Payer: Self-pay

## 2017-11-07 DIAGNOSIS — M169 Osteoarthritis of hip, unspecified: Secondary | ICD-10-CM | POA: Diagnosis present

## 2017-11-07 DIAGNOSIS — Z888 Allergy status to other drugs, medicaments and biological substances status: Secondary | ICD-10-CM

## 2017-11-07 DIAGNOSIS — J45909 Unspecified asthma, uncomplicated: Secondary | ICD-10-CM | POA: Diagnosis present

## 2017-11-07 DIAGNOSIS — R5082 Postprocedural fever: Secondary | ICD-10-CM | POA: Diagnosis not present

## 2017-11-07 DIAGNOSIS — M1612 Unilateral primary osteoarthritis, left hip: Principal | ICD-10-CM | POA: Diagnosis present

## 2017-11-07 DIAGNOSIS — Z885 Allergy status to narcotic agent status: Secondary | ICD-10-CM

## 2017-11-07 DIAGNOSIS — Z87891 Personal history of nicotine dependence: Secondary | ICD-10-CM | POA: Diagnosis not present

## 2017-11-07 DIAGNOSIS — Z79899 Other long term (current) drug therapy: Secondary | ICD-10-CM | POA: Diagnosis not present

## 2017-11-07 DIAGNOSIS — I10 Essential (primary) hypertension: Secondary | ICD-10-CM | POA: Diagnosis present

## 2017-11-07 DIAGNOSIS — Z90710 Acquired absence of both cervix and uterus: Secondary | ICD-10-CM

## 2017-11-07 DIAGNOSIS — Z85820 Personal history of malignant melanoma of skin: Secondary | ICD-10-CM | POA: Diagnosis not present

## 2017-11-07 DIAGNOSIS — Z7982 Long term (current) use of aspirin: Secondary | ICD-10-CM | POA: Diagnosis not present

## 2017-11-07 DIAGNOSIS — K219 Gastro-esophageal reflux disease without esophagitis: Secondary | ICD-10-CM | POA: Diagnosis present

## 2017-11-07 DIAGNOSIS — Z96642 Presence of left artificial hip joint: Secondary | ICD-10-CM | POA: Diagnosis not present

## 2017-11-07 DIAGNOSIS — Z96641 Presence of right artificial hip joint: Secondary | ICD-10-CM | POA: Diagnosis present

## 2017-11-07 DIAGNOSIS — R509 Fever, unspecified: Secondary | ICD-10-CM | POA: Diagnosis present

## 2017-11-07 DIAGNOSIS — Z419 Encounter for procedure for purposes other than remedying health state, unspecified: Secondary | ICD-10-CM

## 2017-11-07 DIAGNOSIS — R945 Abnormal results of liver function studies: Secondary | ICD-10-CM | POA: Diagnosis not present

## 2017-11-07 DIAGNOSIS — Z96649 Presence of unspecified artificial hip joint: Secondary | ICD-10-CM

## 2017-11-07 HISTORY — PX: TOTAL HIP ARTHROPLASTY: SHX124

## 2017-11-07 LAB — TYPE AND SCREEN
ABO/RH(D): O POS
Antibody Screen: NEGATIVE

## 2017-11-07 SURGERY — ARTHROPLASTY, HIP, TOTAL, ANTERIOR APPROACH
Anesthesia: Spinal | Site: Hip | Laterality: Left

## 2017-11-07 MED ORDER — MIDAZOLAM HCL 2 MG/2ML IJ SOLN
INTRAMUSCULAR | Status: AC
  Filled 2017-11-07: qty 2

## 2017-11-07 MED ORDER — PHENOL 1.4 % MT LIQD: 1.0000 | OROMUCOSAL | Status: DC | PRN

## 2017-11-07 MED ORDER — TRANEXAMIC ACID 1000 MG/10ML IV SOLN
1000.0000 mg | INTRAVENOUS | Status: AC
  Administered 2017-11-07: 1000 mg via INTRAVENOUS
  Filled 2017-11-07: qty 10

## 2017-11-07 MED ORDER — BUPIVACAINE-EPINEPHRINE (PF) 0.25% -1:200000 IJ SOLN
INTRAMUSCULAR | Status: DC | PRN
  Administered 2017-11-07: 30 mL

## 2017-11-07 MED ORDER — MENTHOL 3 MG MT LOZG: 1.0000 | LOZENGE | OROMUCOSAL | Status: DC | PRN

## 2017-11-07 MED ORDER — BUPROPION HCL ER (XL) 300 MG PO TB24
300.0000 mg | ORAL_TABLET | Freq: Every day | ORAL | Status: DC
  Administered 2017-11-08 – 2017-11-09 (×2): 300 mg via ORAL
  Filled 2017-11-07 (×2): qty 1

## 2017-11-07 MED ORDER — MORPHINE SULFATE (PF) 2 MG/ML IV SOLN
0.5000 mg | INTRAVENOUS | Status: DC | PRN
  Filled 2017-11-07 (×3): qty 1

## 2017-11-07 MED ORDER — DEXAMETHASONE SODIUM PHOSPHATE 10 MG/ML IJ SOLN
8.0000 mg | Freq: Once | INTRAMUSCULAR | Status: AC
  Administered 2017-11-07: 8 mg via INTRAVENOUS

## 2017-11-07 MED ORDER — METHOCARBAMOL 500 MG PO TABS
500.0000 mg | ORAL_TABLET | Freq: Four times a day (QID) | ORAL | Status: DC | PRN
  Administered 2017-11-07 – 2017-11-09 (×5): 500 mg via ORAL
  Filled 2017-11-07 (×6): qty 1

## 2017-11-07 MED ORDER — METOCLOPRAMIDE HCL 5 MG PO TABS: 5.0000 mg | ORAL_TABLET | Freq: Three times a day (TID) | ORAL | Status: DC | PRN

## 2017-11-07 MED ORDER — MIRABEGRON ER 25 MG PO TB24
50.0000 mg | ORAL_TABLET | Freq: Every day | ORAL | Status: DC
  Administered 2017-11-07 – 2017-11-09 (×3): 50 mg via ORAL
  Filled 2017-11-07 (×3): qty 2

## 2017-11-07 MED ORDER — KETAMINE HCL 10 MG/ML IJ SOLN
INTRAMUSCULAR | Status: AC
  Filled 2017-11-07: qty 1

## 2017-11-07 MED ORDER — SODIUM CHLORIDE 0.9 % IV SOLN
INTRAVENOUS | Status: DC
  Administered 2017-11-07 – 2017-11-08 (×2): via INTRAVENOUS

## 2017-11-07 MED ORDER — LACTATED RINGERS IV SOLN
INTRAVENOUS | Status: DC
  Administered 2017-11-07 (×2): via INTRAVENOUS

## 2017-11-07 MED ORDER — PROPOFOL 10 MG/ML IV BOLUS
INTRAVENOUS | Status: AC
  Filled 2017-11-07: qty 20

## 2017-11-07 MED ORDER — LACTATED RINGERS IV SOLN: INTRAVENOUS | Status: DC

## 2017-11-07 MED ORDER — DEXAMETHASONE SODIUM PHOSPHATE 10 MG/ML IJ SOLN
10.0000 mg | Freq: Once | INTRAMUSCULAR | Status: AC
  Administered 2017-11-08: 10 mg via INTRAVENOUS
  Filled 2017-11-07: qty 1

## 2017-11-07 MED ORDER — PROPOFOL 500 MG/50ML IV EMUL
INTRAVENOUS | Status: DC | PRN
  Administered 2017-11-07: 75 ug/kg/min via INTRAVENOUS

## 2017-11-07 MED ORDER — ONDANSETRON HCL 4 MG/2ML IJ SOLN
INTRAMUSCULAR | Status: AC
  Filled 2017-11-07: qty 2

## 2017-11-07 MED ORDER — DOCUSATE SODIUM 100 MG PO CAPS
100.0000 mg | ORAL_CAPSULE | Freq: Two times a day (BID) | ORAL | Status: DC
  Administered 2017-11-07 – 2017-11-09 (×4): 100 mg via ORAL
  Filled 2017-11-07 (×4): qty 1

## 2017-11-07 MED ORDER — SODIUM CHLORIDE 0.9 % IV SOLN
INTRAVENOUS | Status: DC | PRN
  Administered 2017-11-07: 25 ug/min via INTRAVENOUS

## 2017-11-07 MED ORDER — PROPOFOL 500 MG/50ML IV EMUL
INTRAVENOUS | Status: DC | PRN
  Administered 2017-11-07: 20 mg via INTRAVENOUS

## 2017-11-07 MED ORDER — TRANEXAMIC ACID 1000 MG/10ML IV SOLN
1000.0000 mg | Freq: Once | INTRAVENOUS | Status: AC
  Administered 2017-11-07: 1000 mg via INTRAVENOUS
  Filled 2017-11-07: qty 1000

## 2017-11-07 MED ORDER — POLYETHYLENE GLYCOL 3350 17 G PO PACK: 17.0000 g | PACK | Freq: Every day | ORAL | Status: DC | PRN

## 2017-11-07 MED ORDER — METHOCARBAMOL 500 MG IVPB - SIMPLE MED
500.0000 mg | Freq: Four times a day (QID) | INTRAVENOUS | Status: DC | PRN
  Filled 2017-11-07: qty 50

## 2017-11-07 MED ORDER — ONDANSETRON HCL 4 MG/2ML IJ SOLN
INTRAMUSCULAR | Status: DC | PRN
  Administered 2017-11-07: 4 mg via INTRAVENOUS

## 2017-11-07 MED ORDER — HYDROMORPHONE HCL 1 MG/ML IJ SOLN: 0.2500 mg | INTRAMUSCULAR | Status: DC | PRN

## 2017-11-07 MED ORDER — PANTOPRAZOLE SODIUM 40 MG PO TBEC
40.0000 mg | DELAYED_RELEASE_TABLET | Freq: Every day | ORAL | Status: DC
  Administered 2017-11-08 – 2017-11-09 (×2): 40 mg via ORAL
  Filled 2017-11-07 (×2): qty 1

## 2017-11-07 MED ORDER — METOCLOPRAMIDE HCL 5 MG/ML IJ SOLN: 5.0000 mg | Freq: Three times a day (TID) | INTRAMUSCULAR | Status: DC | PRN

## 2017-11-07 MED ORDER — HYDROMORPHONE HCL 2 MG PO TABS: 4.0000 mg | ORAL_TABLET | ORAL | Status: DC | PRN

## 2017-11-07 MED ORDER — MIDAZOLAM HCL 5 MG/5ML IJ SOLN
INTRAMUSCULAR | Status: DC | PRN
  Administered 2017-11-07: 2 mg via INTRAVENOUS

## 2017-11-07 MED ORDER — METOPROLOL SUCCINATE ER 25 MG PO TB24
25.0000 mg | ORAL_TABLET | Freq: Every day | ORAL | Status: DC
  Administered 2017-11-08 – 2017-11-09 (×2): 25 mg via ORAL
  Filled 2017-11-07 (×2): qty 1

## 2017-11-07 MED ORDER — ACETAMINOPHEN 10 MG/ML IV SOLN
1000.0000 mg | Freq: Four times a day (QID) | INTRAVENOUS | Status: DC
  Administered 2017-11-07: 1000 mg via INTRAVENOUS
  Filled 2017-11-07: qty 100

## 2017-11-07 MED ORDER — EZETIMIBE 10 MG PO TABS
10.0000 mg | ORAL_TABLET | Freq: Every day | ORAL | Status: DC
  Administered 2017-11-08 – 2017-11-09 (×2): 10 mg via ORAL
  Filled 2017-11-07 (×2): qty 1

## 2017-11-07 MED ORDER — DEXAMETHASONE SODIUM PHOSPHATE 10 MG/ML IJ SOLN
INTRAMUSCULAR | Status: AC
  Filled 2017-11-07: qty 1

## 2017-11-07 MED ORDER — BUPIVACAINE-EPINEPHRINE (PF) 0.25% -1:200000 IJ SOLN
INTRAMUSCULAR | Status: AC
  Filled 2017-11-07: qty 30

## 2017-11-07 MED ORDER — PHENYLEPHRINE HCL 10 MG/ML IJ SOLN
INTRAMUSCULAR | Status: AC
  Filled 2017-11-07: qty 1

## 2017-11-07 MED ORDER — ONDANSETRON HCL 4 MG/2ML IJ SOLN
4.0000 mg | Freq: Four times a day (QID) | INTRAMUSCULAR | Status: DC | PRN
  Administered 2017-11-07: 4 mg via INTRAVENOUS
  Filled 2017-11-07: qty 2

## 2017-11-07 MED ORDER — CEFAZOLIN SODIUM-DEXTROSE 2-4 GM/100ML-% IV SOLN
2.0000 g | INTRAVENOUS | Status: AC
  Administered 2017-11-07: 2 g via INTRAVENOUS
  Filled 2017-11-07: qty 100

## 2017-11-07 MED ORDER — ASPIRIN EC 325 MG PO TBEC
325.0000 mg | DELAYED_RELEASE_TABLET | Freq: Two times a day (BID) | ORAL | Status: DC
  Administered 2017-11-08 – 2017-11-09 (×3): 325 mg via ORAL
  Filled 2017-11-07 (×3): qty 1

## 2017-11-07 MED ORDER — DIPHENHYDRAMINE HCL 12.5 MG/5ML PO ELIX: 12.5000 mg | ORAL_SOLUTION | ORAL | Status: DC | PRN

## 2017-11-07 MED ORDER — CEFAZOLIN SODIUM-DEXTROSE 1-4 GM/50ML-% IV SOLN
1.0000 g | Freq: Four times a day (QID) | INTRAVENOUS | Status: AC
  Administered 2017-11-07 (×2): 1 g via INTRAVENOUS
  Filled 2017-11-07 (×2): qty 50

## 2017-11-07 MED ORDER — ACETAMINOPHEN 500 MG PO TABS
500.0000 mg | ORAL_TABLET | Freq: Four times a day (QID) | ORAL | Status: AC
  Administered 2017-11-07 – 2017-11-08 (×4): 500 mg via ORAL
  Filled 2017-11-07 (×4): qty 1

## 2017-11-07 MED ORDER — ONDANSETRON HCL 4 MG PO TABS: 4.0000 mg | ORAL_TABLET | Freq: Four times a day (QID) | ORAL | Status: DC | PRN

## 2017-11-07 MED ORDER — BISACODYL 10 MG RE SUPP: 10.0000 mg | Freq: Every day | RECTAL | Status: DC | PRN

## 2017-11-07 MED ORDER — PROPOFOL 10 MG/ML IV BOLUS
INTRAVENOUS | Status: AC
  Filled 2017-11-07: qty 40

## 2017-11-07 MED ORDER — SERTRALINE HCL 100 MG PO TABS
200.0000 mg | ORAL_TABLET | Freq: Every day | ORAL | Status: DC
  Administered 2017-11-08 – 2017-11-09 (×2): 200 mg via ORAL
  Filled 2017-11-07 (×2): qty 2

## 2017-11-07 MED ORDER — BUPIVACAINE IN DEXTROSE 0.75-8.25 % IT SOLN
INTRATHECAL | Status: DC | PRN
  Administered 2017-11-07: 2 mL via INTRATHECAL

## 2017-11-07 MED ORDER — CHLORHEXIDINE GLUCONATE 4 % EX LIQD: 60.0000 mL | Freq: Once | CUTANEOUS | Status: DC

## 2017-11-07 MED ORDER — LOSARTAN POTASSIUM 50 MG PO TABS
50.0000 mg | ORAL_TABLET | Freq: Every day | ORAL | Status: DC
  Administered 2017-11-08 – 2017-11-09 (×2): 50 mg via ORAL
  Filled 2017-11-07 (×2): qty 1

## 2017-11-07 MED ORDER — FLEET ENEMA 7-19 GM/118ML RE ENEM: 1.0000 | ENEMA | Freq: Once | RECTAL | Status: DC | PRN

## 2017-11-07 MED ORDER — HYDROMORPHONE HCL 2 MG PO TABS
2.0000 mg | ORAL_TABLET | ORAL | Status: DC | PRN
  Administered 2017-11-07 – 2017-11-08 (×3): 2 mg via ORAL
  Filled 2017-11-07 (×3): qty 1

## 2017-11-07 SURGICAL SUPPLY — 40 items
BAG DECANTER FOR FLEXI CONT (MISCELLANEOUS) ×2 IMPLANT
BAG SPEC THK2 15X12 ZIP CLS (MISCELLANEOUS)
BAG ZIPLOCK 12X15 (MISCELLANEOUS) IMPLANT
BLADE SAG 18X100X1.27 (BLADE) ×2 IMPLANT
COVER PERINEAL POST (MISCELLANEOUS) ×2 IMPLANT
COVER SURGICAL LIGHT HANDLE (MISCELLANEOUS) ×2 IMPLANT
CUP ACETBLR 48 OD SECTOR II (Hips) ×1 IMPLANT
DECANTER SPIKE VIAL GLASS SM (MISCELLANEOUS) ×2 IMPLANT
DRAPE STERI IOBAN 125X83 (DRAPES) ×2 IMPLANT
DRAPE U-SHAPE 47X51 STRL (DRAPES) ×4 IMPLANT
DRSG ADAPTIC 3X8 NADH LF (GAUZE/BANDAGES/DRESSINGS) ×3 IMPLANT
DRSG MEPILEX BORDER 4X4 (GAUZE/BANDAGES/DRESSINGS) ×2 IMPLANT
DRSG MEPILEX BORDER 4X8 (GAUZE/BANDAGES/DRESSINGS) ×2 IMPLANT
DURAPREP 26ML APPLICATOR (WOUND CARE) ×2 IMPLANT
ELECT REM PT RETURN 15FT ADLT (MISCELLANEOUS) ×2 IMPLANT
EVACUATOR 1/8 PVC DRAIN (DRAIN) ×2 IMPLANT
GLOVE BIO SURGEON STRL SZ7 (GLOVE) ×2 IMPLANT
GLOVE BIO SURGEON STRL SZ8 (GLOVE) ×2 IMPLANT
GLOVE BIOGEL PI IND STRL 7.0 (GLOVE) ×1 IMPLANT
GLOVE BIOGEL PI IND STRL 8 (GLOVE) ×1 IMPLANT
GLOVE BIOGEL PI INDICATOR 7.0 (GLOVE) ×1
GLOVE BIOGEL PI INDICATOR 8 (GLOVE) ×1
GOWN STRL REUS W/TWL LRG LVL3 (GOWN DISPOSABLE) ×2 IMPLANT
GOWN STRL REUS W/TWL XL LVL3 (GOWN DISPOSABLE) ×2 IMPLANT
HEAD CERAMIC DELTA 28 P1.5 HIP (Head) ×1 IMPLANT
HOLDER FOLEY CATH W/STRAP (MISCELLANEOUS) ×2 IMPLANT
LINER MARATHON 28 48 (Hips) ×1 IMPLANT
MANIFOLD NEPTUNE II (INSTRUMENTS) ×2 IMPLANT
PACK ANTERIOR HIP CUSTOM (KITS) ×2 IMPLANT
STEM CORAIL KA11 (Stem) ×1 IMPLANT
STRIP CLOSURE SKIN 1/2X4 (GAUZE/BANDAGES/DRESSINGS) ×2 IMPLANT
SUT ETHIBOND NAB CT1 #1 30IN (SUTURE) ×2 IMPLANT
SUT MNCRL AB 4-0 PS2 18 (SUTURE) ×2 IMPLANT
SUT STRATAFIX 0 PDS 27 VIOLET (SUTURE) ×2
SUT VIC AB 2-0 CT1 27 (SUTURE) ×4
SUT VIC AB 2-0 CT1 TAPERPNT 27 (SUTURE) ×2 IMPLANT
SUTURE STRATFX 0 PDS 27 VIOLET (SUTURE) ×1 IMPLANT
SYR 50ML LL SCALE MARK (SYRINGE) IMPLANT
TRAY FOLEY MTR SLVR 16FR STAT (SET/KITS/TRAYS/PACK) ×2 IMPLANT
YANKAUER SUCT BULB TIP 10FT TU (MISCELLANEOUS) ×2 IMPLANT

## 2017-11-07 NOTE — Evaluation (Signed)
Physical Therapy Evaluation Patient Details Name: Maria Schneider MRN: [id number] DOB: 07/05/1945 Today's Date: 11/07/2017   History of Present Illness  72 YO female s/p L THA direct anterior approach on 11/07/17. PMH includes OA, bradycardia, gallstones, pacreatitis, costochondritis, hyperlipidemia, depression, HTN, urinary incotinence, skin cancer. Surgical history includes lap chole, R THA anterior approach.   Clinical Impression   Pt is a 72 YO female s/p L direct anterior THA on 11/07/17. PMH as listed above. Pt presents with difficulty with bed mobility, L hip pain, and orthostatic hypotension with sitting EOB. Pt would benefit from acute PT to address mobility deficits. D/c plan is to return home with husband with HEP. \  BP supine: 180/63 BP sitting: 132/83     Follow Up Recommendations Follow surgeon's recommendation for DC plan and follow-up therapies;Supervision for mobility/OOB(Surgeon recommending HEP)    Equipment Recommendations  None recommended by PT    Recommendations for Other Services       Precautions / Restrictions Precautions Precautions: Fall Restrictions Weight Bearing Restrictions: No Other Position/Activity Restrictions: WBAT       Mobility  Bed Mobility Overal bed mobility: Needs Assistance Bed Mobility: Supine to Sit;Sit to Supine     Supine to sit: Min assist Sit to supine: Mod assist   General bed mobility comments: Min assist for LLE management, trunk elevation, scooting to EOB. Verbal cuing for using RLE to push to EOB, scooting to EOB. Orthostatic BP reading and symptomatic for nausea and dizziness upon sitting up. Mod assist for returning to bed for bilat LE management and trunk lowering.  Transfers Overall transfer level: (unable to stand due to pt being symptomatic orthostatic, sat EOB for 5 minutes )                  Ambulation/Gait                Stairs            Wheelchair Mobility    Modified Rankin (Stroke  Patients Only)       Balance Overall balance assessment: Mild deficits observed, not formally tested                                           Pertinent Vitals/Pain Pain Assessment: Faces Faces Pain Scale: Hurts little more Pain Location: R hip  Pain Descriptors / Indicators: Aching;Operative site guarding;Sore;Grimacing Pain Intervention(s): Limited activity within patient's tolerance;Ice applied;Monitored during session;Premedicated before session    Home Living Family/patient expects to be discharged to:: Private residence Living Arrangements: Spouse/significant other Available Help at Discharge: Family;Available PRN/intermittently Type of Home: House Home Access: Stairs to enter Entrance Stairs-Rails: None Entrance Stairs-Number of Steps: 2 Home Layout: Two level;Able to live on main level with bedroom/bathroom Home Equipment: Walker - 4 wheels;Cane - single point Additional Comments: half bath and couch downstairs, will stay downstairs for the time being     Prior Function Level of Independence: Independent with assistive device(s)         Comments: used rollator prior to admission for mobility      Hand Dominance   Dominant Hand: Right    Extremity/Trunk Assessment   Upper Extremity Assessment Upper Extremity Assessment: Overall WFL for tasks assessed    Lower Extremity Assessment Lower Extremity Assessment: Generalized weakness;LLE deficits/detail LLE Deficits / Details: Suspected post-surgical hip weakness, able to perform quad sets x5 and  ankle pumps x10 LLE: Unable to fully assess due to pain LLE Sensation: decreased light touch(decreased light touch on plantar surface of L foot )    Cervical / Trunk Assessment Cervical / Trunk Assessment: Normal  Communication   Communication: No difficulties  Cognition Arousal/Alertness: Awake/alert Behavior During Therapy: WFL for tasks assessed/performed Overall Cognitive Status: Within  Functional Limits for tasks assessed                                        General Comments      Exercises Total Joint Exercises Ankle Circles/Pumps: AROM;Both;10 reps;Supine Quad Sets: AROM;Left;5 reps;Supine Heel Slides: AAROM;Left;5 reps;Supine   Assessment/Plan    PT Assessment Patient needs continued PT services  PT Problem List Decreased strength;Pain;Cardiopulmonary status limiting activity;Decreased range of motion;Decreased activity tolerance;Decreased balance;Decreased mobility;Decreased knowledge of use of DME       PT Treatment Interventions DME instruction;Therapeutic activities;Gait training;Therapeutic exercise;Patient/family education;Stair training;Balance training;Functional mobility training    PT Goals (Current goals can be found in the Care Plan section)  Acute Rehab PT Goals PT Goal Formulation: With patient Time For Goal Achievement: 11/21/17 Potential to Achieve Goals: Good    Frequency 7X/week   Barriers to discharge        Co-evaluation               AM-PAC PT "6 Clicks" Daily Activity  Outcome Measure Difficulty turning over in bed (including adjusting bedclothes, sheets and blankets)?: Unable Difficulty moving from lying on back to sitting on the side of the bed? : Unable Difficulty sitting down on and standing up from a chair with arms (e.g., wheelchair, bedside commode, etc,.)?: Unable Help needed moving to and from a bed to chair (including a wheelchair)?: A Little Help needed walking in hospital room?: A Little Help needed climbing 3-5 steps with a railing? : A Lot 6 Click Score: 11    End of Session Equipment Utilized During Treatment: Gait belt;Oxygen(oxygen reapplied after session ) Activity Tolerance: Treatment limited secondary to medical complications (Comment)(orthostatic hypotension, RN notified ) Patient left: in bed;with bed alarm set;with SCD's reapplied;with call bell/phone within reach;with  family/visitor present Nurse Communication: Mobility status;Other (comment)(orthostatic drop in BP ) PT Visit Diagnosis: Other abnormalities of gait and mobility (R26.89);Difficulty in walking, not elsewhere classified (R26.2)    Time: 7416-3845 PT Time Calculation (min) (ACUTE ONLY): 17 min   Charges:   PT Evaluation $PT Eval Low Complexity: 1 Low          Lavaris Sexson Conception Chancy, PT, DPT  Pager # [phone]    Gabbie Marzo D Carlyle Achenbach 11/07/2017, 4:19 PM

## 2017-11-07 NOTE — Transfer of Care (Signed)
Immediate Anesthesia Transfer of Care Note  Patient: Maria Schneider  Procedure(s) Performed: LEFT TOTAL HIP ARTHROPLASTY ANTERIOR APPROACH (Left Hip)  Patient Location: PACU  Anesthesia Type:MAC and Spinal  Level of Consciousness: awake, alert , oriented and patient cooperative  Airway & Oxygen Therapy: Patient Spontanous Breathing and Patient connected to face mask oxygen  Post-op Assessment: Report given to RN and Post -op Vital signs reviewed and stable  Post vital signs: Reviewed and stable  Last Vitals:  Vitals Value Taken Time  BP 126/56 11/07/2017 11:34 AM  Temp    Pulse 46 11/07/2017 11:35 AM  Resp 15 11/07/2017 11:35 AM  SpO2 100 % 11/07/2017 11:35 AM  Vitals shown include unvalidated device data.  Last Pain:  Vitals:   11/07/17 0819  TempSrc:   PainSc: 0-No pain         Complications: No apparent anesthesia complications

## 2017-11-07 NOTE — Discharge Instructions (Signed)
°Dr. Frank Aluisio °Total Joint Specialist °[facility] °[address] °[place], [place] [id number] °[phone] ° °ANTERIOR APPROACH TOTAL HIP REPLACEMENT POSTOPERATIVE DIRECTIONS ° ° °Hip Rehabilitation, Guidelines Following Surgery  °The results of a hip operation are greatly improved after range of motion and muscle strengthening exercises. Follow all safety measures which are given to protect your hip. If any of these exercises cause increased pain or swelling in your joint, decrease the amount until you are comfortable again. Then slowly increase the exercises. Call your caregiver if you have problems or questions.  ° °HOME CARE INSTRUCTIONS  °• Remove items at home which could result in a fall. This includes throw rugs or furniture in walking pathways.  °· ICE to the affected hip every three hours for 30 minutes at a time and then as needed for pain and swelling.  Continue to use ice on the hip for pain and swelling from surgery. You may notice swelling that will progress down to the foot and ankle.  This is normal after surgery.  Elevate the leg when you are not up walking on it.   °· Continue to use the breathing machine which will help keep your temperature down.  It is common for your temperature to cycle up and down following surgery, especially at night when you are not up moving around and exerting yourself.  The breathing machine keeps your lungs expanded and your temperature down. ° °DIET °You may resume your previous home diet once your are discharged from the hospital. ° °DRESSING / WOUND CARE / SHOWERING °You may shower 3 days after surgery, but keep the wounds dry during showering.  You may use an occlusive plastic wrap (Press'n Seal for example), NO SOAKING/SUBMERGING IN THE BATHTUB.  If the bandage gets wet, change with a clean dry gauze.  If the incision gets wet, pat the wound dry with a clean towel. °You may start showering once you are discharged home but do not submerge the  incision under water. Just pat the incision dry and apply a dry gauze dressing on daily. °Change the surgical dressing daily and reapply a dry dressing each time. ° °ACTIVITY °Walk with your walker as instructed. °Use walker as long as suggested by your caregivers. °Avoid periods of inactivity such as sitting longer than an hour when not asleep. This helps prevent blood clots.  °You may resume a sexual relationship in one month or when given the OK by your doctor.  °You may return to work once you are cleared by your doctor.  °Do not drive a car for 6 weeks or until released by you surgeon.  °Do not drive while taking narcotics. ° °WEIGHT BEARING °Weight bearing as tolerated with assist device (walker, cane, etc) as directed, use it as long as suggested by your surgeon or therapist, typically at least 4-6 weeks. ° °POSTOPERATIVE CONSTIPATION PROTOCOL °Constipation - defined medically as fewer than three stools per week and severe constipation as less than one stool per week. ° °One of the most common issues patients have following surgery is constipation.  Even if you have a regular bowel pattern at home, your normal regimen is likely to be disrupted due to multiple reasons following surgery.  Combination of anesthesia, postoperative narcotics, change in appetite and fluid intake all can affect your bowels.  In order to avoid complications following surgery, here are some recommendations in order to help you during your recovery period. ° °Colace (docusate) - Pick up an over-the-counter form   of Colace or another stool softener and take twice a day as long as you are requiring postoperative pain medications.  Take with a full glass of water daily.  If you experience loose stools or diarrhea, hold the colace until you stool forms back up.  If your symptoms do not get better within 1 week or if they get worse, check with your doctor. ° °Dulcolax (bisacodyl) - Pick up over-the-counter and take as directed by the product  packaging as needed to assist with the movement of your bowels.  Take with a full glass of water.  Use this product as needed if not relieved by Colace only.  ° °MiraLax (polyethylene glycol) - Pick up over-the-counter to have on hand.  MiraLax is a solution that will increase the amount of water in your bowels to assist with bowel movements.  Take as directed and can mix with a glass of water, juice, soda, coffee, or tea.  Take if you go more than two days without a movement. °Do not use MiraLax more than once per day. Call your doctor if you are still constipated or irregular after using this medication for 7 days in a row. ° °If you continue to have problems with postoperative constipation, please contact the office for further assistance and recommendations.  If you experience "the worst abdominal pain ever" or develop nausea or vomiting, please contact the office immediatly for further recommendations for treatment. ° °ITCHING ° If you experience itching with your medications, try taking only a single pain pill, or even half a pain pill at a time.  You can also use Benadryl over the counter for itching or also to help with sleep.  ° °TED HOSE STOCKINGS °Wear the elastic stockings on both legs for three weeks following surgery during the day but you may remove then at night for sleeping. ° °MEDICATIONS °See your medication summary on the “After Visit Summary” that the nursing staff will review with you prior to discharge.  You may have some home medications which will be placed on hold until you complete the course of blood thinner medication.  It is important for you to complete the blood thinner medication as prescribed by your surgeon.  Continue your approved medications as instructed at time of discharge. ° °PRECAUTIONS °If you experience chest pain or shortness of breath - call 911 immediately for transfer to the hospital emergency department.  °If you develop a fever greater that 101 F, purulent drainage  from wound, increased redness or drainage from wound, foul odor from the wound/dressing, or calf pain - CONTACT YOUR SURGEON.   °                                                °FOLLOW-UP APPOINTMENTS °Make sure you keep all of your appointments after your operation with your surgeon and caregivers. You should call the office at the above phone number and make an appointment for approximately two weeks after the date of your surgery or on the date instructed by your surgeon outlined in the "After Visit Summary". ° °RANGE OF MOTION AND STRENGTHENING EXERCISES  °These exercises are designed to help you keep full movement of your hip joint. Follow your caregiver's or physical therapist's instructions. Perform all exercises about fifteen times, three times per day or as directed. Exercise both hips, even if you have   had only one joint replacement. These exercises can be done on a training (exercise) mat, on the floor, on a table or on a bed. Use whatever works the best and is most comfortable for you. Use music or television while you are exercising so that the exercises are a pleasant break in your day. This will make your life better with the exercises acting as a break in routine you can look forward to.  °• Lying on your back, slowly slide your foot toward your buttocks, raising your knee up off the floor. Then slowly slide your foot back down until your leg is straight again.  °• Lying on your back spread your legs as far apart as you can without causing discomfort.  °• Lying on your side, raise your upper leg and foot straight up from the floor as far as is comfortable. Slowly lower the leg and repeat.  °• Lying on your back, tighten up the muscle in the front of your thigh (quadriceps muscles). You can do this by keeping your leg straight and trying to raise your heel off the floor. This helps strengthen the largest muscle supporting your knee.  °• Lying on your back, tighten up the muscles of your buttocks both  with the legs straight and with the knee bent at a comfortable angle while keeping your heel on the floor.  ° °IF YOU ARE TRANSFERRED TO A SKILLED REHAB FACILITY °If the patient is transferred to a skilled rehab facility following release from the hospital, a list of the current medications will be sent to the facility for the patient to continue.  When discharged from the skilled rehab facility, please have the facility set up the patient's Home Health Physical Therapy prior to being released. Also, the skilled facility will be responsible for providing the patient with their medications at time of release from the facility to include their pain medication, the muscle relaxants, and their blood thinner medication. If the patient is still at the rehab facility at time of the two week follow up appointment, the skilled rehab facility will also need to assist the patient in arranging follow up appointment in our office and any transportation needs. ° °MAKE SURE YOU:  °• Understand these instructions.  °• Get help right away if you are not doing well or get worse.  ° ° °Pick up stool softner and laxative for home use following surgery while on pain medications. °Do not submerge incision under water. °Please use good hand washing techniques while changing dressing each day. °May shower starting three days after surgery. °Please use a clean towel to pat the incision dry following showers. °Continue to use ice for pain and swelling after surgery. °Do not use any lotions or creams on the incision until instructed by your surgeon. ° °

## 2017-11-07 NOTE — Anesthesia Postprocedure Evaluation (Signed)
Anesthesia Post Note  Patient: Maria Schneider  Procedure(s) Performed: LEFT TOTAL HIP ARTHROPLASTY ANTERIOR APPROACH (Left Hip)     Patient location during evaluation: PACU Anesthesia Type: Spinal Level of consciousness: oriented and awake and alert Pain management: pain level controlled Vital Signs Assessment: post-procedure vital signs reviewed and stable Respiratory status: spontaneous breathing, respiratory function stable and patient connected to nasal cannula oxygen Cardiovascular status: blood pressure returned to baseline and stable Postop Assessment: no headache, no backache and no apparent nausea or vomiting Anesthetic complications: no    Last Vitals:  Vitals:   11/07/17 1225 11/07/17 1334  BP: (!) 155/66 (!) 179/106  Pulse: (!) 46 (!) 58  Resp: 16 16  Temp: (!) 36.3 C 36.6 C  SpO2: 100% 100%    Last Pain:  Vitals:   11/07/17 1334  TempSrc: Oral  PainSc:                  [place]

## 2017-11-07 NOTE — Anesthesia Procedure Notes (Signed)
Spinal  Patient location during procedure: OR Start time: 11/07/2017 9:40 AM End time: 11/07/2017 9:50 AM Staffing Anesthesiologist: Freddrick March, MD Performed: anesthesiologist  Preanesthetic Checklist Completed: patient identified, surgical consent, pre-op evaluation, timeout performed, IV checked, risks and benefits discussed and monitors and equipment checked Spinal Block Patient position: sitting Prep: site prepped and draped and DuraPrep Patient monitoring: cardiac monitor, continuous pulse ox and blood pressure Approach: midline Location: L3-4 Injection technique: single-shot Needle Needle type: Pencan  Needle gauge: 24 G Needle length: 9 cm Assessment Sensory level: T6 Additional Notes Functioning IV was confirmed and monitors were applied. Sterile prep and drape, including hand hygiene and sterile gloves were used. The patient was positioned and the spine was prepped. The skin was anesthetized with lidocaine.  Free flow of clear CSF was obtained prior to injecting local anesthetic into the CSF.  The spinal needle aspirated freely following injection.  The needle was carefully withdrawn.  The patient tolerated the procedure well.

## 2017-11-07 NOTE — Op Note (Signed)
OPERATIVE REPORT- TOTAL HIP ARTHROPLASTY   PREOPERATIVE DIAGNOSIS: Osteoarthritis of the Left hip.   POSTOPERATIVE DIAGNOSIS: Osteoarthritis of the Left  hip.   PROCEDURE: Left total hip arthroplasty, anterior approach.   SURGEON: Gaynelle Arabian, MD   ASSISTANT: Theresa Duty, PA-C  ANESTHESIA:  Spinal  ESTIMATED BLOOD LOSS:-200 mL    DRAINS: Hemovac x1.   COMPLICATIONS: None   CONDITION: PACU - hemodynamically stable.   BRIEF CLINICAL NOTE: Maria Schneider is a 72 y.o. female who has advanced end-  stage arthritis of their Left  hip with progressively worsening pain and  dysfunction.The patient has failed nonoperative management and presents for  total hip arthroplasty.   PROCEDURE IN DETAIL: After successful administration of spinal  anesthetic, the traction boots for the [facility] bed were placed on both  feet and the patient was placed onto the [facility] bed, boots placed into the leg  holders. The Left hip was then isolated from the perineum with plastic  drapes and prepped and draped in the usual sterile fashion. ASIS and  greater trochanter were marked and a oblique incision was made, starting  at about 1 cm lateral and 2 cm distal to the ASIS and coursing towards  the anterior cortex of the femur. The skin was cut with a 10 blade  through subcutaneous tissue to the level of the fascia overlying the  tensor fascia lata muscle. The fascia was then incised in line with the  incision at the junction of the anterior third and posterior 2/3rd. The  muscle was teased off the fascia and then the interval between the TFL  and the rectus was developed. The Hohmann retractor was then placed at  the top of the femoral neck over the capsule. The vessels overlying the  capsule were cauterized and the fat on top of the capsule was removed.  A Hohmann retractor was then placed anterior underneath the rectus  femoris to give exposure to the entire anterior capsule. A T-shaped   capsulotomy was performed. The edges were tagged and the femoral head  was identified.       Osteophytes are removed off the superior acetabulum.  The femoral neck was then cut in situ with an oscillating saw. Traction  was then applied to the left lower extremity utilizing the [facility]  traction. The femoral head was then removed. Retractors were placed  around the acetabulum and then circumferential removal of the labrum was  performed. Osteophytes were also removed. Reaming starts at 45 mm to  medialize and  Increased in 2 mm increments to 47 mm. We reamed in  approximately 40 degrees of abduction, 20 degrees anteversion. A 48 mm  pinnacle acetabular shell was then impacted in anatomic position under  fluoroscopic guidance with excellent purchase. We did not need to place  any additional dome screws. A 28 mm neutral + 4 marathon liner was then  placed into the acetabular shell.       The femoral lift was then placed along the lateral aspect of the femur  just distal to the vastus ridge. The leg was  externally rotated and capsule  was stripped off the inferior aspect of the femoral neck down to the  level of the lesser trochanter, this was done with electrocautery. The femur was lifted after this was performed. The  leg was then placed in an extended and adducted position essentially delivering the femur. We also removed the capsule superiorly and the piriformis from the piriformis fossa to gain excellent  exposure of the  proximal femur. Rongeur was used to remove some cancellous bone to get  into the lateral portion of the proximal femur for placement of the  initial starter reamer. The starter broaches was placed  the starter broach  and was shown to go down the center of the canal. Broaching  with the Corail system was then performed starting at size 8  coursing  Up to size 11. A size 11 had excellent torsional and rotational  and axial stability. The trial standard offset neck was then  placed  with a 28 + 1.5 trial head. The hip was then reduced. We confirmed that  the stem was in the canal both on AP and lateral x-rays. It also has excellent sizing. The hip was reduced with outstanding stability through full extension and full external rotation.. AP pelvis was taken and the leg lengths were measured and found to be equal. Hip was then dislocated again and the femoral head and neck removed. The  femoral broach was removed. Size 11 Corail stem with a standard offset  neck was then impacted into the femur following native anteversion. Has  excellent purchase in the canal. Excellent torsional and rotational and  axial stability. It is confirmed to be in the canal on AP and lateral  fluoroscopic views. The 28 + 1.5 ceramic head was placed and the hip  reduced with outstanding stability. Again AP pelvis was taken and it  confirmed that the leg lengths were equal. The wound was then copiously  irrigated with saline solution and the capsule reattached and repaired  with Ethibond suture. 30 ml of .25% Bupivicaine was  injected into the capsule and into the edge of the tensor fascia lata as well as subcutaneous tissue. The fascia overlying the tensor fascia lata was then closed with a running #1 V-Loc. Subcu was closed with interrupted 2-0 Vicryl and subcuticular running 4-0 Monocryl. Incision was cleaned  and dried. Steri-Strips and a bulky sterile dressing applied. Hemovac  drain was hooked to suction and then the patient was awakened and transported to  recovery in stable condition.        Please note that a surgical assistant was a medical necessity for this procedure to perform it in a safe and expeditious manner. Assistant was necessary to provide appropriate retraction of vital neurovascular structures and to prevent femoral fracture and allow for anatomic placement of the prosthesis.  Gaynelle Arabian, M.D.

## 2017-11-07 NOTE — Interval H&P Note (Signed)
History and Physical Interval Note:  11/07/2017 8:23 AM  Maria Schneider  has presented today for surgery, with the diagnosis of left hip osteoarthritis  The various methods of treatment have been discussed with the patient and family. After consideration of risks, benefits and other options for treatment, the patient has consented to  Procedure(s): LEFT TOTAL HIP ARTHROPLASTY ANTERIOR APPROACH (Left) as a surgical intervention .  The patient's history has been reviewed, patient examined, no change in status, stable for surgery.  I have reviewed the patient's chart and labs.  Questions were answered to the patient's satisfaction.     Pilar Plate Hadyn Azer

## 2017-11-07 NOTE — Anesthesia Procedure Notes (Signed)
Date/Time: 11/07/2017 9:41 AM Performed by: Glory Buff, CRNA Oxygen Delivery Method: Simple face mask

## 2017-11-08 ENCOUNTER — Encounter (HOSPITAL_COMMUNITY): Payer: Self-pay | Admitting: Orthopedic Surgery

## 2017-11-08 LAB — BASIC METABOLIC PANEL
ANION GAP: 6 (ref 5–15)
BUN: 15 mg/dL (ref 8–23)
CHLORIDE: 108 mmol/L (ref 98–111)
CO2: 28 mmol/L (ref 22–32)
CREATININE: 0.79 mg/dL (ref 0.44–1.00)
Calcium: 8.9 mg/dL (ref 8.9–10.3)
GFR calc non Af Amer: >60 mL/min (ref >60)
Glucose, Bld: 125 mg/dL — ABNORMAL HIGH (ref 70–99)
POTASSIUM: 3.9 mmol/L (ref 3.5–5.1)
SODIUM: 142 mmol/L (ref 135–145)

## 2017-11-08 LAB — CBC
HEMATOCRIT: 31.1 % — AB (ref 36.0–46.0)
HEMOGLOBIN: 10.1 g/dL — AB (ref 12.0–15.0)
MCH: 28.1 pg (ref 26.0–34.0)
MCHC: 32.5 g/dL (ref 30.0–36.0)
MCV: 86.6 fL (ref 78.0–100.0)
Platelets: 155 10*3/uL (ref 150–400)
RBC: 3.59 MIL/uL — AB (ref 3.87–5.11)
RDW: 13.3 % (ref 11.5–15.5)
WBC: 5.8 10*3/uL (ref 4.0–10.5)

## 2017-11-08 MED ORDER — HYDROMORPHONE HCL 2 MG PO TABS
2.0000 mg | ORAL_TABLET | ORAL | Status: DC | PRN
  Administered 2017-11-08 – 2017-11-09 (×4): 2 mg via ORAL
  Filled 2017-11-08 (×4): qty 1

## 2017-11-08 MED ORDER — METHOCARBAMOL 500 MG PO TABS: 500.0000 mg | ORAL_TABLET | Freq: Four times a day (QID) | ORAL | 0 refills | Status: DC | PRN

## 2017-11-08 MED ORDER — HYDROMORPHONE HCL 2 MG PO TABS: 2.0000 mg | ORAL_TABLET | Freq: Four times a day (QID) | ORAL | 0 refills | Status: DC | PRN

## 2017-11-08 MED ORDER — ASPIRIN 325 MG PO TBEC: 325.0000 mg | DELAYED_RELEASE_TABLET | Freq: Two times a day (BID) | ORAL | 0 refills | Status: DC

## 2017-11-08 NOTE — Progress Notes (Signed)
   Subjective: 1 Day Post-Op Procedure(s) (LRB): LEFT TOTAL HIP ARTHROPLASTY ANTERIOR APPROACH (Left) Patient reports pain as mild.   Patient seen in rounds by Dr. Wynelle Link. Patient is well, and has had no acute complaints or problems other than pain in the left hip. Foley catheter removed this AM, denies chest pain or SOB. We will continue therapy today.   Objective: Vital signs in last 24 hours: Temp:  [97.3 F (36.3 C)-98.6 F (37 C)] 98.6 F (37 C) (08/22 0543) Pulse Rate:  [40-71] 65 (08/22 0543) Resp:  [14-17] 16 (08/22 0543) BP: (126-180)/(56-106) 147/65 (08/22 0543) SpO2:  [96 %-100 %] 97 % (08/22 0543) Weight:  [74.4 kg] 74.4 kg (08/21 1534)  Intake/Output from previous day:  Intake/Output Summary (Last 24 hours) at 11/08/2017 0804 Last data filed at 11/08/2017 0604 Gross per 24 hour  Intake 3463.92 ml  Output 1875 ml  Net 1588.92 ml    Labs: Recent Labs    11/08/17 0515  HGB 10.1*   Recent Labs    11/08/17 0515  WBC 5.8  RBC 3.59*  HCT 31.1*  PLT 155   Recent Labs    11/08/17 0515  NA 142  K 3.9  CL 108  CO2 28  BUN 15  CREATININE 0.79  GLUCOSE 125*  CALCIUM 8.9    Exam: General - Patient is Alert and Oriented Extremity - Neurologically intact Neurovascular intact Sensation intact distally Dorsiflexion/Plantar flexion intact Dressing - dressing C/D/I Motor Function - intact, moving foot and toes well on exam.   Past Medical History:  Diagnosis Date  . Allergy   . Arthritis   . Asthma   . Cancer ([place])    hx skin cancer  . Depression   . Detrusor instability   . Diverticulosis    not symptomatic  . GERD (gastroesophageal reflux disease)   . History of kidney stones   . History of pancreatitis    April 2015  . History of skin cancer   . Hyperlipidemia   . Hypertension   . Osteopenia   . Urinary incontinence   . Vitamin D deficiency    RESOLVED     Assessment/Plan: 1 Day Post-Op Procedure(s) (LRB): LEFT TOTAL HIP  ARTHROPLASTY ANTERIOR APPROACH (Left) Active Problems:   OA (osteoarthritis) of hip  Estimated body mass index is 27.29 kg/m as calculated from the following:   Height as of this encounter: 5\' 5"  (1.651 m).   Weight as of this encounter: 74.4 kg. Advance diet Up with therapy D/C IV fluids  DVT Prophylaxis - Aspirin Weight bearing as tolerated. D/C O2 and pulse ox and try on room air. Hemovac pulled without difficulty, will continue therapy.  Plan is to go Home after hospital stay. Possible discharge this afternoon if progresses with therapy and meeting goals. Follow-up in the office in 2 weeks with Dr. Wynelle Link.  Theresa Duty, PA-C Orthopedic Surgery 11/08/2017, 8:04 AM

## 2017-11-08 NOTE — Progress Notes (Signed)
Physical Therapy Treatment Patient Details Name: Maria Schneider MRN: [id number] DOB: [birth year] Today's Date: 11/08/2017    History of Present Illness 72 YO female s/p L THA direct anterior approach on 11/07/17. PMH includes OA, bradycardia, gallstones, pacreatitis, costochondritis, hyperlipidemia, depression, HTN, urinary incotinence, skin cancer. Surgical history includes lap chole, R THA anterior approach.     PT Comments     The patient progressed ambulation but limite due to C/O burning right thigh pain. Patient tearful and stopped multiple times while ambulating. Patient is struggling  To meet PT goals. Recommend delay Dc until tomorrow.    Follow Up Recommendations  Follow surgeon's recommendation for DC plan and follow-up therapies;Supervision for mobility/OOB;No PT follow up     Equipment Recommendations  None recommended by PT    Recommendations for Other Services       Precautions / Restrictions Precautions Precautions: Fall Precaution Comments: incontinent, wear briefs/ pads    Mobility  Bed Mobility   Bed Mobility: Sit to Supine     Supine to sit: Min assist;HOB elevated Sit to supine: Min assist   General bed mobility comments: used belt around the right foot to self assist  right leg back into bed.  Transfers Overall transfer level: Needs assistance Equipment used: Rolling walker (2 wheeled) Transfers: Sit to/from Stand Sit to Stand: Min assist Stand pivot transfers: Min assist       General transfer comment: cues for hand and right leg position. Patient complains of  burning pain in right thigh and reportss than first visit.  Able to stand from [facility]. Patient reports that the right knee popped when getting up from toilet.   Ambulation/Gait Ambulation/Gait assistance: Min assist Gait Distance (Feet): 80 Feet(and 20) Assistive device: Rolling walker (2 wheeled) Gait Pattern/deviations: Step-to pattern;Antalgic;Decreased step length - right;Decreased  stance time - right Gait velocity: decr   General Gait Details: patient stopped intermittently due to burning pain. Able to progress ambulation   Stairs             Wheelchair Mobility    Modified Rankin (Stroke Patients Only)       Balance Overall balance assessment: Needs assistance         Standing balance support: During functional activity;Single extremity supported Standing balance-Leahy Scale: Fair Standing balance comment: stand and manipulate briefs                            Cognition Arousal/Alertness: Awake/alert Behavior During Therapy: Anxious                                          Exercises      General Comments        Pertinent Vitals/Pain Faces Pain Scale: Hurts whole lot Pain Location: R thigh Pain Descriptors / Indicators: Burning Pain Intervention(s): Monitored during session;Premedicated before session;Ice applied    Home Living                      Prior Function            PT Goals (current goals can now be found in the care plan section) Progress towards PT goals: Progressing toward goals    Frequency    7X/week      PT Plan Current plan remains appropriate    Co-evaluation  AM-PAC PT "6 Clicks" Daily Activity  Outcome Measure  Difficulty turning over in bed (including adjusting bedclothes, sheets and blankets)?: A Lot Difficulty moving from lying on back to sitting on the side of the bed? : A Lot Difficulty sitting down on and standing up from a chair with arms (e.g., wheelchair, bedside commode, etc,.)?: A Lot Help needed moving to and from a bed to chair (including a wheelchair)?: A Lot Help needed walking in hospital room?: Total Help needed climbing 3-5 steps with a railing? : Total 6 Click Score: 10    End of Session Equipment Utilized During Treatment: Gait belt Activity Tolerance: Patient limited by pain Patient left: in bed;with family/visitor  present;with call bell/phone within reach Nurse Communication: Mobility status(patient not meeting PT goals due to Pain complaints) PT Visit Diagnosis: Other abnormalities of gait and mobility (R26.89);Difficulty in walking, not elsewhere classified (R26.2)     Time: 2248-2500 PT Time Calculation (min) (ACUTE ONLY): 21 min  Charges:  $Gait Training: 8-22 mins                     [facility] PT [phone]    [name] 11/08/2017, 1:07 PM

## 2017-11-08 NOTE — Progress Notes (Signed)
Physical Therapy Treatment Patient Details Name: Maria Schneider MRN: [id number] DOB: [birth year] Today's Date: 11/08/2017    History of Present Illness 72 YO female s/p L THA direct anterior approach on 11/07/17. PMH includes OA, bradycardia, gallstones, pacreatitis, costochondritis, hyperlipidemia, depression, HTN, urinary incotinence, skin cancer. Surgical history includes lap chole, R THA anterior approach.     PT Comments    Patient reports burning pain is decreasing. Tolerated  Bath and now exercises without ionset. Plans DC tomorrow after PT, practice of stairs.    Follow Up Recommendations  Follow surgeon's recommendation for DC plan and follow-up therapies;Supervision for mobility/OOB;No PT follow up     Equipment Recommendations  None recommended by PT - from friend.needs to borrow 2 wheeled RW from friend, has a 4 wheeled.   Recommendations for Other Services       Precautions / Restrictions Precautions Precautions: Fall Precaution Comments: incontinent, wear briefs/ pads    Mobility     Stairs             Wheelchair Mobility    Modified Rankin (Stroke Patients Only)       Balance Overall balance assessment: Needs assistance                                    Cognition Arousal/Alertness: Awake/alert Behavior During Therapy: Anxious                                          Exercises Total Joint Exercises Ankle Circles/Pumps: AROM;Both;10 reps;Supine Quad Sets: AROM;Supine;Both;10 reps Short Arc Quad: AROM;Left;20 reps Heel Slides: AAROM;Left;Supine;10 reps Hip ABduction/ADduction: AAROM;Left;15 reps;Supine    General Comments        Pertinent Vitals/Pain Faces Pain Scale: Hurts a little bit Pain Location: R thigh Pain Descriptors / Indicators: Discomfort Pain Intervention(s): Monitored during session;Premedicated before session;Ice applied    Home Living                      Prior Function            PT Goals (current goals can now be found in the care plan section) Progress towards PT goals: Progressing toward goals    Frequency    7X/week      PT Plan Current plan remains appropriate    Co-evaluation              AM-PAC PT "6 Clicks" Daily Activity  Outcome Measure  Difficulty turning over in bed (including adjusting bedclothes, sheets and blankets)?: A Lot Difficulty moving from lying on back to sitting on the side of the bed? : A Lot Difficulty sitting down on and standing up from a chair with arms (e.g., wheelchair, bedside commode, etc,.)?: A Lot Help needed moving to and from a bed to chair (including a wheelchair)?: A Lot Help needed walking in hospital room?: Total Help needed climbing 3-5 steps with a railing? : Total 6 Click Score: 8    End of Session Equipment Utilized During Treatment: Gait belt Activity Tolerance: Patient tolerated treatment well Patient left: in bed;with call bell/phone within reach Nurse Communication: Mobility status PT Visit Diagnosis: Other abnormalities of gait and mobility (R26.89);Difficulty in walking, not elsewhere classified (R26.2)     Time: 7048-8891 PT Time Calculation (min) (ACUTE ONLY): 31 min  Charges:  $  Therapeutic Exercise: 23-37 mins                    [facility] PT [phone]   Maria Schneider 11/08/2017, 2:33 PM

## 2017-11-08 NOTE — Progress Notes (Signed)
Physical Therapy Treatment Patient Details Name: Maria Schneider MRN: [id number] DOB: 1945-08-03 Today's Date: 11/08/2017    History of Present Illness 72 YO female s/p L THA direct anterior approach on 11/07/17. PMH includes OA, bradycardia, gallstones, pacreatitis, costochondritis, hyperlipidemia, depression, HTN, urinary incotinence, skin cancer. Surgical history includes lap chole, R THA anterior approach.     PT Comments    The patient reports that burning pain is severe and unable to progress ambulation. RN aware. Will attempt ambulation next visit.   Follow Up Recommendations  Follow surgeon's recommendation for DC plan and follow-up therapies;Supervision for mobility/OOB;No PT follow up(HEP)     Equipment Recommendations  None recommended by PT    Recommendations for Other Services       Precautions / Restrictions Precautions Precautions: Fall Precaution Comments: incontinent, wear briefs/ pads    Mobility  Bed Mobility   Bed Mobility: Supine to Sit     Supine to sit: Min assist;HOB elevated     General bed mobility comments: Min assist for LLE management, trunk elevation, scooting to EOB. Verbal cuing for using RLE to push to EOB, scooting to EOB.   Transfers Overall transfer level: Needs assistance Equipment used: Rolling walker (2 wheeled) Transfers: Sit to/from [facility] Sit to Stand: Min assist Stand pivot transfers: Min assist       General transfer comment: cues for hand and right leg position. Patient complains of severe burning in right thigh. Transfer to [facility] with noted decreased WB on the  RLE.   Ambulation/Gait Ambulation/Gait assistance: Min assist Gait Distance (Feet): 15 Feet Assistive device: Rolling walker (2 wheeled) Gait Pattern/deviations: Step-to pattern;Antalgic;Decreased step length - right;Decreased stance time - right     General Gait Details: noted buckling of the right  leg at times. Burning pain is limitiing  mobility   Stairs             Wheelchair Mobility    Modified Rankin (Stroke Patients Only)       Balance Overall balance assessment: Needs assistance         Standing balance support: During functional activity;Single extremity supported Standing balance-Leahy Scale: Fair Standing balance comment: stand and manipulate briefs                            Cognition Arousal/Alertness: Awake/alert Behavior During Therapy: Anxious                                          Exercises      General Comments        Pertinent Vitals/Pain Faces Pain Scale: Hurts worst Pain Location: R thigh Pain Descriptors / Indicators: Burning Pain Intervention(s): Monitored during session;Premedicated before session;Ice applied;Limited activity within patient's tolerance;Patient requesting pain meds-RN notified    Home Living                      Prior Function            PT Goals (current goals can now be found in the care plan section) Progress towards PT goals: Progressing toward goals    Frequency    7X/week      PT Plan Current plan remains appropriate    Co-evaluation              AM-PAC PT "6 Clicks" Daily Activity  Outcome Measure  Difficulty turning over in bed (including adjusting bedclothes, sheets and blankets)?: A Lot Difficulty moving from lying on back to sitting on the side of the bed? : A Lot Difficulty sitting down on and standing up from a chair with arms (e.g., wheelchair, bedside commode, etc,.)?: Unable Help needed moving to and from a bed to chair (including a wheelchair)?: Total Help needed walking in hospital room?: Total Help needed climbing 3-5 steps with a railing? : Total 6 Click Score: 8    End of Session Equipment Utilized During Treatment: Gait belt Activity Tolerance: Patient limited by pain Patient left: in chair;with call bell/phone within reach;with family/visitor present Nurse  Communication: Mobility status;Patient requests pain meds PT Visit Diagnosis: Other abnormalities of gait and mobility (R26.89);Difficulty in walking, not elsewhere classified (R26.2)     Time: 9179-1505 PT Time Calculation (min) (ACUTE ONLY): 25 min  Charges:  $Gait Training: 8-22 mins $Self Care/Home Management: 8-22                     [facility] PT [phone]    [name] 11/08/2017, 12:53 PM

## 2017-11-08 NOTE — Plan of Care (Signed)
Pt alert and oriented, pain in hip with PT and movement.  Meds given per MD order.  RN will monitor.

## 2017-11-09 ENCOUNTER — Other Ambulatory Visit: Payer: Self-pay

## 2017-11-09 ENCOUNTER — Emergency Department (HOSPITAL_COMMUNITY): Payer: Medicare Other

## 2017-11-09 ENCOUNTER — Encounter (HOSPITAL_COMMUNITY): Payer: Self-pay

## 2017-11-09 ENCOUNTER — Emergency Department (HOSPITAL_COMMUNITY)
Admission: EM | Admit: 2017-11-09 | Discharge: 2017-11-10 | Disposition: A | Discharge status: Home / Self Care | Payer: Medicare Other | Attending: Emergency Medicine | Admitting: Emergency Medicine

## 2017-11-09 DIAGNOSIS — R5082 Postprocedural fever: Secondary | ICD-10-CM

## 2017-11-09 DIAGNOSIS — Z79899 Other long term (current) drug therapy: Secondary | ICD-10-CM | POA: Insufficient documentation

## 2017-11-09 DIAGNOSIS — Z7982 Long term (current) use of aspirin: Secondary | ICD-10-CM | POA: Insufficient documentation

## 2017-11-09 DIAGNOSIS — Z87891 Personal history of nicotine dependence: Secondary | ICD-10-CM | POA: Insufficient documentation

## 2017-11-09 DIAGNOSIS — I10 Essential (primary) hypertension: Secondary | ICD-10-CM | POA: Insufficient documentation

## 2017-11-09 DIAGNOSIS — R7989 Other specified abnormal findings of blood chemistry: Secondary | ICD-10-CM

## 2017-11-09 DIAGNOSIS — R945 Abnormal results of liver function studies: Secondary | ICD-10-CM | POA: Insufficient documentation

## 2017-11-09 DIAGNOSIS — Z96642 Presence of left artificial hip joint: Secondary | ICD-10-CM | POA: Insufficient documentation

## 2017-11-09 DIAGNOSIS — Z85820 Personal history of malignant melanoma of skin: Secondary | ICD-10-CM | POA: Insufficient documentation

## 2017-11-09 LAB — CBC WITH DIFFERENTIAL/PLATELET
Basophils Absolute: 0 10*3/uL (ref 0.0–0.1)
Basophils Relative: 0 %
Eosinophils Absolute: 0.1 10*3/uL (ref 0.0–0.7)
Eosinophils Relative: 2 %
HCT: 28.8 % — ABNORMAL LOW (ref 36.0–46.0)
HEMOGLOBIN: 9.5 g/dL — AB (ref 12.0–15.0)
LYMPHS ABS: 0.4 10*3/uL — AB (ref 0.7–4.0)
LYMPHS PCT: 10 %
MCH: 28.7 pg (ref 26.0–34.0)
MCHC: 33 g/dL (ref 30.0–36.0)
MCV: 87 fL (ref 78.0–100.0)
Monocytes Absolute: 0.5 10*3/uL (ref 0.1–1.0)
Monocytes Relative: 12 %
NEUTROS ABS: 3.2 10*3/uL (ref 1.7–7.7)
NEUTROS PCT: 76 %
Platelets: 139 10*3/uL — ABNORMAL LOW (ref 150–400)
RBC: 3.31 MIL/uL — AB (ref 3.87–5.11)
RDW: 13.6 % (ref 11.5–15.5)
WBC: 4.1 10*3/uL (ref 4.0–10.5)

## 2017-11-09 LAB — CBC
HEMATOCRIT: 30 % — AB (ref 36.0–46.0)
HEMOGLOBIN: 9.7 g/dL — AB (ref 12.0–15.0)
MCH: 28.2 pg (ref 26.0–34.0)
MCHC: 32.3 g/dL (ref 30.0–36.0)
MCV: 87.2 fL (ref 78.0–100.0)
Platelets: 133 10*3/uL — ABNORMAL LOW (ref 150–400)
RBC: 3.44 MIL/uL — AB (ref 3.87–5.11)
RDW: 13.6 % (ref 11.5–15.5)
WBC: 5 10*3/uL (ref 4.0–10.5)

## 2017-11-09 LAB — BASIC METABOLIC PANEL
ANION GAP: 8 (ref 5–15)
BUN: 11 mg/dL (ref 8–23)
CHLORIDE: 107 mmol/L (ref 98–111)
CO2: 25 mmol/L (ref 22–32)
Calcium: 8.6 mg/dL — ABNORMAL LOW (ref 8.9–10.3)
Creatinine, Ser: 0.75 mg/dL (ref 0.44–1.00)
GFR calc non Af Amer: >60 mL/min (ref >60)
Glucose, Bld: 94 mg/dL (ref 70–99)
POTASSIUM: 3.9 mmol/L (ref 3.5–5.1)
Sodium: 140 mmol/L (ref 135–145)

## 2017-11-09 LAB — I-STAT CG4 LACTIC ACID, ED: Lactic Acid, Venous: 0.82 mmol/L (ref 0.5–1.9)

## 2017-11-09 MED ORDER — ASPIRIN 325 MG PO TBEC: 325.0000 mg | DELAYED_RELEASE_TABLET | Freq: Two times a day (BID) | ORAL | 0 refills | Status: AC

## 2017-11-09 MED ORDER — HYDROMORPHONE HCL 2 MG PO TABS: 2.0000 mg | ORAL_TABLET | Freq: Four times a day (QID) | ORAL | 0 refills | Status: DC | PRN

## 2017-11-09 MED ORDER — SODIUM CHLORIDE 0.9 % IV BOLUS
1000.0000 mL | Freq: Once | INTRAVENOUS | Status: AC
  Administered 2017-11-10: 1000 mL via INTRAVENOUS

## 2017-11-09 NOTE — Plan of Care (Signed)
Pt doing well this am.  Husband at the bedside.  Plan to d/c home per MD order.

## 2017-11-09 NOTE — ED Notes (Signed)
Bed: GB61 Expected date:  Expected time:  Means of arrival:  Comments: Room 15- 72 F fever of 102 following hip replacement

## 2017-11-09 NOTE — Progress Notes (Signed)
Physical Therapy Treatment Patient Details Name: Maria Schneider MRN: [id number] DOB: [birth year] Today's Date: 11/09/2017    History of Present Illness 72 YO female s/p L THA direct anterior approach on 11/07/17. PMH includes OA, bradycardia, gallstones, pacreatitis, costochondritis, hyperlipidemia, depression, HTN, urinary incotinence, skin cancer. Surgical history includes lap chole, R THA anterior approach.     PT Comments    POD # 2 Assisted with amb a greater distance in hallway, practiced stairs with spouse and performed THR TE's following HEP handout.  Instructed on proper tech as well as use of ICE.   Follow Up Recommendations  Follow surgeon's recommendation for DC plan and follow-up therapies;Supervision for mobility/OOB;No PT follow up     Equipment Recommendations  None recommended by PT    Recommendations for Other Services       Precautions / Restrictions Precautions Precautions: Fall Precaution Comments: incontinent, wear briefs/ pads Restrictions Weight Bearing Restrictions: No Other Position/Activity Restrictions: WBAT     Mobility  Bed Mobility Overal bed mobility: Needs Assistance Bed Mobility: Supine to Sit     Supine to sit: Min assist;HOB elevated     General bed mobility comments: demonstarted and instructed how to use a belt to self assist L LE on/off bed  Transfers Overall transfer level: Needs assistance Equipment used: Rolling walker (2 wheeled) Transfers: Sit to/from Stand Sit to Stand: Min assist Stand pivot transfers: Min assist       General transfer comment: increased time and one VC safety with turns  Ambulation/Gait Ambulation/Gait assistance: Supervision Gait Distance (Feet): 125 Feet Assistive device: Rolling walker (2 wheeled) Gait Pattern/deviations: Step-to pattern;Antalgic;Decreased step length - right;Decreased stance time - right Gait velocity: decreased   General Gait Details: x 2 standing breaks do to c/o  radiating/burning/sharpe thigh pain   Stairs Stairs: Yes Stairs assistance: Min assist Stair Management: No rails;Step to pattern;Forwards;With walker Number of Stairs: 2 General stair comments: with spouse "hands on" assist with 25% VC's on proper walker placement and sequencing    Wheelchair Mobility    Modified Rankin (Stroke Patients Only)       Balance                                            Cognition Arousal/Alertness: Awake/alert Behavior During Therapy: WFL for tasks assessed/performed Overall Cognitive Status: Within Functional Limits for tasks assessed                                        Exercises   Total Hip Replacement TE's 10 reps ankle pumps 10 reps knee presses 10 reps heel slides 10 reps SAQ's 10 reps ABD Followed by ICE    General Comments        Pertinent Vitals/Pain Pain Assessment: 0-10 Pain Score: 5  Pain Location: R thigh Pain Descriptors / Indicators: Discomfort;Radiating Pain Intervention(s): Monitored during session;Repositioned;Ice applied    Home Living                      Prior Function            PT Goals (current goals can now be found in the care plan section) Progress towards PT goals: Progressing toward goals    Frequency    7X/week  PT Plan Current plan remains appropriate    Co-evaluation              AM-PAC PT "6 Clicks" Daily Activity  Outcome Measure  Difficulty turning over in bed (including adjusting bedclothes, sheets and blankets)?: A Little Difficulty moving from lying on back to sitting on the side of the bed? : A Little Difficulty sitting down on and standing up from a chair with arms (e.g., wheelchair, bedside commode, etc,.)?: A Little Help needed moving to and from a bed to chair (including a wheelchair)?: A Little Help needed walking in hospital room?: A Little Help needed climbing 3-5 steps with a railing? : A Little 6 Click  Score: 18    End of Session Equipment Utilized During Treatment: Gait belt Activity Tolerance: Patient tolerated treatment well Patient left: in bed;with call bell/phone within reach Nurse Communication: (pt ready for D/C to home) PT Visit Diagnosis: Other abnormalities of gait and mobility (R26.89);Difficulty in walking, not elsewhere classified (R26.2)     Time: 1655-3748 PT Time Calculation (min) (ACUTE ONLY): 32 min  Charges:  $Gait Training: 8-22 mins $Therapeutic Exercise: 8-22 mins                     {Matalyn Nawaz  PTA [facility]  Acute  Rehab Pager      [phone]

## 2017-11-09 NOTE — Progress Notes (Signed)
   Subjective: 2 Days Post-Op Procedure(s) (LRB): LEFT TOTAL HIP ARTHROPLASTY ANTERIOR APPROACH (Left) Patient reports pain as mild.   Patient seen in rounds by Dr. Wynelle Link. Patient is well, and has had no acute complaints or problems other than pain in the left hip. States she is ready to go home. Denies chest pain or SOB. Voiding without difficulty and positive flatus.  Plan is to go Home after hospital stay.  Objective: Vital signs in last 24 hours: Temp:  [98.4 F (36.9 C)-98.9 F (37.2 C)] 98.4 F (36.9 C) (08/23 0651) Pulse Rate:  [62-74] 62 (08/23 0651) Resp:  [15-21] 17 (08/23 0651) BP: (130-155)/(72-78) 131/78 (08/23 0651) SpO2:  [96 %-98 %] 97 % (08/23 0651)  Intake/Output from previous day:  Intake/Output Summary (Last 24 hours) at 11/09/2017 0712 Last data filed at 11/09/2017 0600 Gross per 24 hour  Intake 1287.14 ml  Output 500 ml  Net 787.14 ml    Labs: Recent Labs    11/08/17 0515 11/09/17 0435  HGB 10.1* 9.7*   Recent Labs    11/08/17 0515 11/09/17 0435  WBC 5.8 5.0  RBC 3.59* 3.44*  HCT 31.1* 30.0*  PLT 155 133*   Recent Labs    11/08/17 0515 11/09/17 0435  NA 142 140  K 3.9 3.9  CL 108 107  CO2 28 25  BUN 15 11  CREATININE 0.79 0.75  GLUCOSE 125* 94  CALCIUM 8.9 8.6*   Exam: General - Patient is Alert and Oriented Extremity - Neurologically intact Neurovascular intact Sensation intact distally Dorsiflexion/Plantar flexion intact Dressing/Incision - clean, dry, no drainage Motor Function - intact, moving foot and toes well on exam.   Past Medical History:  Diagnosis Date  . Allergy   . Arthritis   . Asthma   . Cancer ([place])    hx skin cancer  . Depression   . Detrusor instability   . Diverticulosis    not symptomatic  . GERD (gastroesophageal reflux disease)   . History of kidney stones   . History of pancreatitis    April 2015  . History of skin cancer   . Hyperlipidemia   . Hypertension   . Osteopenia   . Urinary  incontinence   . Vitamin D deficiency    RESOLVED     Assessment/Plan: 2 Days Post-Op Procedure(s) (LRB): LEFT TOTAL HIP ARTHROPLASTY ANTERIOR APPROACH (Left) Active Problems:   OA (osteoarthritis) of hip  Estimated body mass index is 27.29 kg/m as calculated from the following:   Height as of this encounter: 5\' 5"  (1.651 m).   Weight as of this encounter: 74.4 kg. Up with therapy D/C IV fluids  DVT Prophylaxis - Aspirin Weight-bearing as tolerated  Plan for discharge to home today if meeting goals with therapy. Follow-up in the office in 2 weeks with Dr. Wynelle Link.  Theresa Duty, PA-C Orthopedic Surgery 11/09/2017, 7:12 AM

## 2017-11-09 NOTE — Care Management Note (Signed)
Case Management Note  Patient Details  Name: Maria Schneider MRN: [id number] Date of Birth: [birth year]  Subjective/Objective:       Spoke with patient at bedside. Confirmed plan for HEP. Has RW and 3n1. [phone]             Action/Plan: No needs  Expected Discharge Date:  11/09/17               Expected Discharge Plan:  Home/Self Care  In-House Referral:  NA  Discharge planning Services  CM Consult  Post Acute Care Choice:  NA Choice offered to:  Patient  DME Arranged:  N/A DME Agency:  NA  HH Arranged:  NA HH Agency:  NA  Status of Service:  Completed, signed off  If discussed at [place] of Stay Meetings, dates discussed:    Additional Comments:  Guadalupe Maple, RN 11/09/2017, 10:04 AM

## 2017-11-09 NOTE — ED Triage Notes (Signed)
Pt arrived by [facility] due to post surgical left hip pain. Pt c/o pain 8/10. Pt states to EMS that her fever at home was 102 degrees oral. Pt was just discharged today from [facility].

## 2017-11-10 LAB — URINALYSIS, ROUTINE W REFLEX MICROSCOPIC
BILIRUBIN URINE: NEGATIVE
Bacteria, UA: NONE SEEN
Glucose, UA: NEGATIVE mg/dL
Ketones, ur: NEGATIVE mg/dL
Leukocytes, UA: NEGATIVE
NITRITE: NEGATIVE
Protein, ur: NEGATIVE mg/dL
SPECIFIC GRAVITY, URINE: 1.005 (ref 1.005–1.030)
pH: 5 (ref 5.0–8.0)

## 2017-11-10 LAB — COMPREHENSIVE METABOLIC PANEL
ALK PHOS: 190 U/L — AB (ref 38–126)
ALT: 209 U/L — AB (ref 0–44)
AST: 193 U/L — ABNORMAL HIGH (ref 15–41)
Albumin: 3.1 g/dL — ABNORMAL LOW (ref 3.5–5.0)
Anion gap: 8 (ref 5–15)
BUN: 17 mg/dL (ref 8–23)
CALCIUM: 8.3 mg/dL — AB (ref 8.9–10.3)
CO2: 28 mmol/L (ref 22–32)
CREATININE: 0.97 mg/dL (ref 0.44–1.00)
Chloride: 103 mmol/L (ref 98–111)
GFR, EST NON AFRICAN AMERICAN: 58 mL/min — AB (ref >60)
Glucose, Bld: 127 mg/dL — ABNORMAL HIGH (ref 70–99)
Potassium: 3.8 mmol/L (ref 3.5–5.1)
Sodium: 139 mmol/L (ref 135–145)
Total Bilirubin: 0.5 mg/dL (ref 0.3–1.2)
Total Protein: 5.8 g/dL — ABNORMAL LOW (ref 6.5–8.1)

## 2017-11-10 LAB — PROTIME-INR
INR: 1.07
PROTHROMBIN TIME: 13.8 s (ref 11.4–15.2)

## 2017-11-10 MED ORDER — HYDROMORPHONE HCL 2 MG PO TABS
2.0000 mg | ORAL_TABLET | Freq: Once | ORAL | Status: AC
  Administered 2017-11-10: 2 mg via ORAL
  Filled 2017-11-10: qty 1

## 2017-11-10 NOTE — Discharge Instructions (Signed)
We signed the ER for fevers. Each has had recent hip replacement, therefore there is concern for infection.  Our work-up here does not show any specific source of infection.  You have redness to the left eye, which could be simply bleeding at the muscle level.  If you noticed that the redness is spreading, then you need to come back to the ER.  Additionally, if you start having any burning with urination, cough, shortness of breath, chest pain with breathing -please return to the ER immediately.  Finally if you start having sweats, chills, worsening of your hip pain and inability to walk come back to the ER immediately.

## 2017-11-10 NOTE — ED Provider Notes (Signed)
[name] DEPT Provider Note   CSN: [id number] Arrival date & time: 11/09/17  2317     History   Chief Complaint Chief Complaint  Patient presents with  . Hip Pain    Left    HPI Maria Schneider is a 72 y.o. female.  HPI  72 year old female with history of pancreatitis, hyperlipidemia, hypertension and recent left hip replacement comes in with chief complaint of fever.  Patient is now POD #3 from her surgery, and she was just discharged early in the day.  According to patient she started having a fever later in the evening.  T-max was 102, and patient took Tylenol prior to ED arrival.  Patient reports worsening pain in her left leg, including the thigh and hip.  She did have physical therapy earlier today, but had more difficulty ranging then she did the day before.  Patient denies any chest pain, shortness of breath, cough, abdominal pain, back pain, UTI-like symptoms, new rash, drainage from the wound site.  She also denies any chills or diaphoresis.  Patient does not take any immunosuppressive agents.  Past Medical History:  Diagnosis Date  . Allergy   . Arthritis   . Asthma   . Cancer ([place])    hx skin cancer  . Depression   . Detrusor instability   . Diverticulosis    not symptomatic  . GERD (gastroesophageal reflux disease)   . History of kidney stones   . History of pancreatitis    April 2015  . History of skin cancer   . Hyperlipidemia   . Hypertension   . Osteopenia   . Urinary incontinence   . Vitamin D deficiency    RESOLVED     Patient Active Problem List   Diagnosis Date Noted  . OA (osteoarthritis) of hip 01/13/2015  . B12 deficiency 01/07/2014  . Bradycardia 01/07/2014  . Abnormal cholangiogram - CBD stones suspected 07/14/2013  . Gall stone pancreatitis 07/09/2013  . PYELONEPHRITIS, ACUTE 05/22/2009  . COSTOCHONDRITIS 04/28/2009  . HYPERLIPIDEMIA 04/12/2009  . DEPRESSION 04/12/2009  . Essential hypertension  04/12/2009  . ALLERGIC RHINITIS 04/12/2009  . ASTHMA 04/12/2009  . GERD 04/12/2009  . URINARY INCONTINENCE 04/12/2009    Past Surgical History:  Procedure Laterality Date  . ABDOMINAL HYSTERECTOMY  1994   TAH/ BLADDER SUSPENSION  . BREAST BIOPSY  1972   benign  . CHOLECYSTECTOMY N/A 07/12/2013   Procedure: LAPAROSCOPIC CHOLECYSTECTOMY WITH INTRAOPERATIVE CHOLANGIOGRAM;  Surgeon: Edward Jolly, MD;  Location: [facility];  Service: General;  Laterality: N/A;  . COLONOSCOPY  06-24-12   per Dr. Earlean Shawl, benign polyps, repeat in 5 yrs    . ERCP N/A 07/14/2013   Procedure: ENDOSCOPIC RETROGRADE CHOLANGIOPANCREATOGRAPHY (ERCP);  Surgeon: Gatha Mayer, MD;  Location: [facility];  Service: Endoscopy;  Laterality: N/A;  . TONSILLECTOMY    . TOTAL HIP ARTHROPLASTY Right 01/13/2015   Procedure: RIGHT TOTAL HIP ARTHROPLASTY ANTERIOR APPROACH;  Surgeon: Gaynelle Arabian, MD;  Location: [facility];  Service: Orthopedics;  Laterality: Right;  . TOTAL HIP ARTHROPLASTY Left 11/07/2017   Procedure: LEFT TOTAL HIP ARTHROPLASTY ANTERIOR APPROACH;  Surgeon: Gaynelle Arabian, MD;  Location: [facility];  Service: Orthopedics;  Laterality: Left;  . TUBAL LIGATION       OB History    Gravida  2   Para  1   Term  1   Preterm      AB  1   Living  1     SAB  1   TAB      Ectopic      Multiple      Live Births               Home Medications    Prior to Admission medications   Medication Sig Start Date End Date Taking? Authorizing Provider  acetaminophen (TYLENOL) 500 MG tablet Take 1,000 mg by mouth every 6 (six) hours as needed for moderate pain.   Yes [provider]  aspirin 325 MG EC tablet Take 1 tablet (325 mg total) by mouth 2 (two) times daily for 19 days. Take one tablet (325 mg) Aspirin two times a day for three weeks following surgery. Then take one baby Aspirin (81 mg) once a day for three weeks. Then discontinue aspirin. 11/09/17 11/28/17 Yes Edmisten, Kristie L, PA    buPROPion (WELLBUTRIN XL) 300 MG 24 hr tablet Take 300 mg by mouth daily.     Yes [provider]  Cholecalciferol (VITAMIN D PO) Take 5,000 Units by mouth daily. Reported on 06/09/2015   Yes [provider]  esomeprazole (NEXIUM) 20 MG capsule Take 20 mg by mouth daily.    Yes [provider]  ezetimibe (ZETIA) 10 MG tablet Take 1 tablet (10 mg total) by mouth daily. 02/24/16  Yes Laurey Morale, MD  fenofibrate 160 MG tablet TAKE 1 TABLET BY MOUTH EVERY DAY Patient taking differently: Take 160 mg by mouth daily.  04/16/17  Yes Laurey Morale, MD  HYDROmorphone (DILAUDID) 2 MG tablet Take 1 tablet (2 mg total) by mouth every 6 (six) hours as needed for moderate pain or severe pain. 11/09/17  Yes Edmisten, Kristie L, PA  losartan (COZAAR) 50 MG tablet TAKE 1 TABLET BY MOUTH EVERY DAY Patient taking differently: Take 50 mg by mouth daily.  09/07/17  Yes Laurey Morale, MD  methocarbamol (ROBAXIN) 500 MG tablet Take 1 tablet (500 mg total) by mouth every 6 (six) hours as needed for muscle spasms. 11/08/17  Yes Edmisten, Kristie L, PA  metoprolol succinate (TOPROL-XL) 25 MG 24 hr tablet TAKE 1 TABLET BY MOUTH EVERY DAY Patient taking differently: Take 25 mg by mouth daily.  11/02/17  Yes Laurey Morale, MD  MYRBETRIQ 50 MG TB24 tablet Take 50 mg by mouth daily. 03/02/16  Yes [provider]  sertraline (ZOLOFT) 100 MG tablet Take 200 mg by mouth daily.    Yes [provider]  DETROL LA 4 MG 24 hr capsule TAKE ONE CAPSULE BY MOUTH EVERY DAY 08/18/11 09/21/12  Bennetta Laos, MD    Family History Family History  Problem Relation Age of Onset  . Hypertension Mother   . Osteoporosis Mother   . Heart disease Father   . Osteoporosis Sister   . Breast cancer Paternal Aunt        Age 6's  . Diabetes Paternal Uncle     Social History Social History   Tobacco Use  . Smoking status: Former Smoker    Years: 42.00    Types: Cigarettes    Last attempt to  quit: 07/09/2008    Years since quitting: 9.3  . Smokeless tobacco: Never Used  . Tobacco comment: 9 years ago  Substance Use Topics  . Alcohol use: Yes    Alcohol/week: 2.0 standard drinks    Types: 2 Glasses of wine per week    Comment: OCC  . Drug use: No     Allergies   Ace inhibitors; Codeine; Fentanyl;  and Statins   Review of Systems Review of Systems  Constitutional: Positive for activity change and fever.  Respiratory: Negative for shortness of breath.   Cardiovascular: Negative for chest pain.  Musculoskeletal: Positive for arthralgias and myalgias.  Allergic/Immunologic: Negative for immunocompromised state.  All other systems reviewed and are negative.    Physical Exam Updated Vital Signs BP 105/70   Pulse 64   Temp 98 F (36.7 C) (Oral)   Resp 12   Ht 5\' 5"  (1.651 m)   Wt 74.4 kg   SpO2 97%   BMI 27.29 kg/m   Physical Exam  Constitutional: She is oriented to person, place, and time. She appears well-developed.  HENT:  Head: Normocephalic and atraumatic.  Eyes: EOM are normal.  Neck: Normal range of motion. Neck supple.  Cardiovascular: Normal rate.  Pulmonary/Chest: Effort normal.  Abdominal: Bowel sounds are normal.  Neurological: She is alert and oriented to person, place, and time.  Skin: Skin is warm and dry.  Patient has a blanching erythematous lesion without any calor in her left thigh. The site of JP tube drainage appears normal.  There is no fluctuance or crepitus.  There does appear to be some induration with tenderness to palpation.  The incision wound also appears normal with no drainage surrounding it  Nursing note and vitals reviewed.         ED Treatments / Results  Labs (all labs ordered are listed, but only abnormal results are displayed) Labs Reviewed  COMPREHENSIVE METABOLIC PANEL - Abnormal; Notable for the following components:      Result Value   Glucose, Bld 127 (*)    Calcium 8.3 (*)    Total Protein 5.8 (*)     Albumin 3.1 (*)    AST 193 (*)    ALT 209 (*)    Alkaline Phosphatase 190 (*)    GFR calc non Af Amer 58 (*)    All other components within normal limits  CBC WITH DIFFERENTIAL/PLATELET - Abnormal; Notable for the following components:   RBC 3.31 (*)    Hemoglobin 9.5 (*)    HCT 28.8 (*)    Platelets 139 (*)    Lymphs Abs 0.4 (*)    All other components within normal limits  URINALYSIS, ROUTINE W REFLEX MICROSCOPIC - Abnormal; Notable for the following components:   Hgb urine dipstick SMALL (*)    All other components within normal limits  CULTURE, BLOOD (ROUTINE X 2)  CULTURE, BLOOD (ROUTINE X 2)  URINE CULTURE  PROTIME-INR  I-STAT CG4 LACTIC ACID, ED  I-STAT CG4 LACTIC ACID, ED    EKG EKG Interpretation  Date/Time:  Friday November 09 2017 23:39:53 EDT Ventricular Rate:  78 PR Interval:    QRS Duration: 76 QT Interval:  382 QTC Calculation: 436 R Axis:   28 Text Interpretation:  Sinus rhythm Low voltage, precordial leads Abnormal R-wave progression, early transition No acute changes No significant change since last tracing Confirmed by Varney Biles ([id number]) on 11/10/2017 12:15:14 AM   Radiology Dg Chest Port 1 View  Result Date: 11/10/2017 CLINICAL DATA:  Fever, post left hip replacement 2 days ago. EXAM: PORTABLE CHEST 1 VIEW COMPARISON:  Radiograph 05/12/2017 FINDINGS: The cardiomediastinal contours are normal. The lungs are clear. Pulmonary vasculature is normal. No consolidation, pleural effusion, or pneumothorax. No acute osseous abnormalities are seen. IMPRESSION: No active disease.  Specifically no focal airspace disease. Electronically Signed   By: Jeb Levering M.D.   On: 11/10/2017 00:10  Procedures Procedures (including critical care time)  Medications Ordered in ED Medications  sodium chloride 0.9 % bolus 1,000 mL (0 mLs Intravenous Stopped 11/10/17 0231)  HYDROmorphone (DILAUDID) tablet 2 mg (2 mg Oral Given 11/10/17 0252)     Initial Impression /  Assessment and Plan / ED Course  I have reviewed the triage vital signs and the nursing notes.  Pertinent labs & imaging results that were available during my care of the patient were reviewed by me and considered in my medical decision making (see chart for details).  Clinical Course as of Nov 11 626  Sat Nov 10, 2017  0021 Spoke with Dr. Stann Mainland, orthopedics. He reports that his suspicion for septic arthritis or infected joint is extremely low, as patient is only 2 days postop from the surgery.  I informed him of the pain in the leg, hip and the erythematous lesion in the thigh.  Dr. Stann Mainland informed me that the lesion is typical postop hematoma-bruising.  He wants to make sure that patient is using the incentive spirometer properly, and able to be mobile.  I was able to range the joint slightly (30 degrees), and patient tolerated that okay.   [AN]  0025 LFTs are slightly elevated.  Patient does not have any abdominal tenderness with it.  Could be sequelae of anesthesia related complication or medication induced.  Patient has been advised to get the LFTs rechecked by PCP in 1 week.  Bilirubin is normal, which is reassuring.  AST(!): 193 [AN]  [id number] Patient has been observed all night.  Her pain is significantly improved over time.  Repeat temperature at 530 was normal. She has been provided with incentive spirometer that she has been using okay.  She continues to have no cough, chest pain, shortness of breath.  Pain in the hip is better.  Patient and her husband are comfortable going home.  They are aware of our conversation with Dr. Stann Mainland, and will return to the ER if she starts having worsening symptoms.  Otherwise he will call Dr. Lorre Nick on Monday to see if he needs to be seen sooner.   [AN]    Clinical Course User Index [AN] Varney Biles, MD    72 year old female comes in with chief complaint of fever.  Patient is immunocompetent and she is status post left hip replacement, postop day  #3.  Patient started noticing fever this evening.  She has on exam erythematous lesion in her thigh, without any evidence of fluctuance or drainage.  Patient does not recall taking a look at her wound, her husband is also not looked at the wound therefore we are unsure if this lesion is simply evolution of her hematoma or an actual cellulitis.  Labs here are overall reassuring. X-ray looks normal, UA is fine. Patient is noted to have intermittent drops in O2 sats to the lower 90s.  I suspect that the hypoxia is because of atelectasis -as lung exam otherwise is clear.  Plan is to monitor patient closely and then will reassess.  Final Clinical Impressions(s) / ED Diagnoses   Final diagnoses:  Post-procedural fever  Elevated LFTs    ED Discharge Orders    None       Varney Biles, MD 11/10/17 [phone]

## 2017-11-12 LAB — URINE CULTURE: Culture: 20000 — AB

## 2017-11-12 NOTE — Discharge Summary (Signed)
Physician Discharge Summary   Patient ID: Maria Schneider MRN: [id number] DOB/AGE: 70-May-1947 70 y.o.  Admit date: 11/07/2017 Discharge date: 11/09/2017  Primary Diagnosis: Osteoarthritis, left hip   Admission Diagnoses:  Past Medical History:  Diagnosis Date  . Allergy   . Arthritis   . Asthma   . Cancer ([place])    hx skin cancer  . Depression   . Detrusor instability   . Diverticulosis    not symptomatic  . GERD (gastroesophageal reflux disease)   . History of kidney stones   . History of pancreatitis    April 2015  . History of skin cancer   . Hyperlipidemia   . Hypertension   . Osteopenia   . Urinary incontinence   . Vitamin D deficiency    RESOLVED    Discharge Diagnoses:   Active Problems:   OA (osteoarthritis) of hip  Estimated body mass index is 27.29 kg/m as calculated from the following:   Height as of this encounter: '5\' 5"'  (1.651 m).   Weight as of this encounter: 74.4 kg.  Procedure:  Procedure(s) (LRB): LEFT TOTAL HIP ARTHROPLASTY ANTERIOR APPROACH (Left)   Consults: None  HPI: Maria Schneider is a 72 y.o. female who has advanced end-stage arthritis of their Left  hip with progressively worsening pain and dysfunction.The patient has failed nonoperative management and presents for total hip arthroplasty.   Laboratory Data: Admission on 11/09/2017, Discharged on 11/10/2017  Component Date Value Ref Range Status  . Sodium 11/09/2017 139  135 - 145 mmol/L Final  . Potassium 11/09/2017 3.8  3.5 - 5.1 mmol/L Final  . Chloride 11/09/2017 103  98 - 111 mmol/L Final  . CO2 11/09/2017 28  22 - 32 mmol/L Final  . Glucose, Bld 11/09/2017 127* 70 - 99 mg/dL Final  . BUN 11/09/2017 17  8 - 23 mg/dL Final  . Creatinine, Ser 11/09/2017 0.97  0.44 - 1.00 mg/dL Final  . Calcium 11/09/2017 8.3* 8.9 - 10.3 mg/dL Final  . Total Protein 11/09/2017 5.8* 6.5 - 8.1 g/dL Final  . Albumin 11/09/2017 3.1* 3.5 - 5.0 g/dL Final  . AST 11/09/2017 193* 15 - 41 U/L Final  .  ALT 11/09/2017 209* 0 - 44 U/L Final  . Alkaline Phosphatase 11/09/2017 190* 38 - 126 U/L Final  . Total Bilirubin 11/09/2017 0.5  0.3 - 1.2 mg/dL Final  . GFR calc non Af Amer 11/09/2017 58* >60 mL/min Final  . GFR calc Af Amer 11/09/2017 >60  >60 mL/min Final   Comment: (NOTE) The eGFR has been calculated using the CKD EPI equation. This calculation has not been validated in all clinical situations. eGFR's persistently <60 mL/min signify possible Chronic Kidney Disease.   Georgiann Hahn gap 11/09/2017 8  5 - 15 Final   Performed at [facility], [place] [address] [place], [place] [address]  . Lactic Acid, Venous 11/09/2017 0.82  0.5 - 1.9 mmol/L Final  . WBC 11/09/2017 4.1  4.0 - 10.5 K/uL Final  . RBC 11/09/2017 3.31* 3.87 - 5.11 MIL/uL Final  . Hemoglobin 11/09/2017 9.5* 12.0 - 15.0 g/dL Final  . HCT 11/09/2017 28.8* 36.0 - 46.0 % Final  . MCV 11/09/2017 87.0  78.0 - 100.0 fL Final  . MCH 11/09/2017 28.7  26.0 - 34.0 pg Final  . MCHC 11/09/2017 33.0  30.0 - 36.0 g/dL Final  . RDW 11/09/2017 13.6  11.5 - 15.5 % Final  . Platelets 11/09/2017 139* 150 - 400 K/uL Final  .  Neutrophils Relative % 11/09/2017 76  % Final  . Neutro Abs 11/09/2017 3.2  1.7 - 7.7 K/uL Final  . Lymphocytes Relative 11/09/2017 10  % Final  . Lymphs Abs 11/09/2017 0.4* 0.7 - 4.0 K/uL Final  . Monocytes Relative 11/09/2017 12  % Final  . Monocytes Absolute 11/09/2017 0.5  0.1 - 1.0 K/uL Final  . Eosinophils Relative 11/09/2017 2  % Final  . Eosinophils Absolute 11/09/2017 0.1  0.0 - 0.7 K/uL Final  . Basophils Relative 11/09/2017 0  % Final  . Basophils Absolute 11/09/2017 0.0  0.0 - 0.1 K/uL Final   Performed at [facility], [place] [address] [place], [place] [address]  . Prothrombin Time 11/09/2017 13.8  11.4 - 15.2 seconds Final  . INR 11/09/2017 1.07   Final   Performed at [facility], [place] [address] [place], [place] [address]  . Specimen Description  11/09/2017    Final                   Value:BLOOD RIGHT FOREARM Performed at [facility], [place] [address], [place], [place] [address]   . Special Requests 11/09/2017    Final                   Value:BOTTLES DRAWN AEROBIC AND ANAEROBIC Blood Culture adequate volume Performed at [place] [address] [place], [place] [address]   . Culture 11/09/2017    Final                   Value:NO GROWTH 1 DAY Performed at [facility], [place] [address], [place], [place] [address]   . Report Status 11/09/2017 PENDING   Incomplete  . Specimen Description 11/09/2017    Final                   Value:BLOOD RIGHT FOREARM Performed at [facility], [place] [address] [place], [place] [address]   . Special Requests 11/09/2017    Final                   Value:BOTTLES DRAWN AEROBIC AND ANAEROBIC Blood Culture adequate volume Performed at [place] [address] [place], [place] [address]   . Culture 11/09/2017    Final                   Value:NO GROWTH 1 DAY Performed at [facility], [place] [address], [place],  [address]   . Report Status 11/09/2017 PENDING   Incomplete  . Color, Urine 11/09/2017 YELLOW  YELLOW Final  . APPearance 11/09/2017 CLEAR  CLEAR Final  . Specific Gravity, Urine 11/09/2017 1.005  1.005 - 1.030 Final  . pH 11/09/2017 5.0  5.0 - 8.0 Final  . Glucose, UA 11/09/2017 NEGATIVE  NEGATIVE mg/dL Final  . Hgb urine dipstick 11/09/2017 SMALL* NEGATIVE Final  . Bilirubin Urine 11/09/2017 NEGATIVE  NEGATIVE Final  . Ketones, ur 11/09/2017 NEGATIVE  NEGATIVE mg/dL Final  . Protein, ur 11/09/2017 NEGATIVE  NEGATIVE mg/dL Final  . Nitrite 11/09/2017 NEGATIVE  NEGATIVE Final  . Leukocytes, UA 11/09/2017 NEGATIVE  NEGATIVE Final  . RBC / HPF 11/09/2017 0-5  0 - 5 RBC/hpf Final  . WBC, UA 11/09/2017 0-5  0 - 5 WBC/hpf Final  . Bacteria, UA 11/09/2017 NONE SEEN  NONE SEEN Final  . Squamous Epithelial / LPF 11/09/2017  0-5  0 -  5 Final   Performed at [facility], [place] [address] [place], [place] [address]  . Specimen Description 11/10/2017    Final                   Value:URINE, RANDOM Performed at [facility], [place] [address] [place], [place] [address]   . Special Requests 11/10/2017    Final                   Value:NONE Performed at [facility], [place] [address], [place], [place] [address]   . Culture 11/10/2017 20,000 COLONIES/mL PROVIDENCIA STUARTII*  Final  . Report Status 11/10/2017 PENDING   Incomplete  Admission on 11/07/2017, Discharged on 11/09/2017  Component Date Value Ref Range Status  . WBC 11/08/2017 5.8  4.0 - 10.5 K/uL Final  . RBC 11/08/2017 3.59* 3.87 - 5.11 MIL/uL Final  . Hemoglobin 11/08/2017 10.1* 12.0 - 15.0 g/dL Final  . HCT 11/08/2017 31.1* 36.0 - 46.0 % Final  . MCV 11/08/2017 86.6  78.0 - 100.0 fL Final  . MCH 11/08/2017 28.1  26.0 - 34.0 pg Final  . MCHC 11/08/2017 32.5  30.0 - 36.0 g/dL Final  . RDW 11/08/2017 13.3  11.5 - 15.5 % Final  . Platelets 11/08/2017 155  150 - 400 K/uL Final   Performed at [facility], [place] [address], [place], [place] [address]  . Sodium 11/08/2017 142  135 - 145 mmol/L Final  . Potassium 11/08/2017 3.9  3.5 - 5.1 mmol/L Final  . Chloride 11/08/2017 108  98 - 111 mmol/L Final  . CO2 11/08/2017 28  22 - 32 mmol/L Final  . Glucose, Bld 11/08/2017 125* 70 - 99 mg/dL Final  . BUN 11/08/2017 15  8 - 23 mg/dL Final  . Creatinine, Ser 11/08/2017 0.79  0.44 - 1.00 mg/dL Final  . Calcium 11/08/2017 8.9  8.9 - 10.3 mg/dL Final  . GFR calc non Af Amer 11/08/2017 >60  >60 mL/min Final  . GFR calc Af Amer 11/08/2017 >60  >60 mL/min Final   Comment: (NOTE) The eGFR has been calculated using the CKD EPI equation. This calculation has not been validated in all clinical situations. eGFR's persistently <60 mL/min signify possible Chronic Kidney Disease.   Georgiann Hahn gap  11/08/2017 6  5 - 15 Final   Performed at [facility], [place] [address], [place], [place] [address]  . WBC 11/09/2017 5.0  4.0 - 10.5 K/uL Final  . RBC 11/09/2017 3.44* 3.87 - 5.11 MIL/uL Final  . Hemoglobin 11/09/2017 9.7* 12.0 - 15.0 g/dL Final  . HCT 11/09/2017 30.0* 36.0 - 46.0 % Final  . MCV 11/09/2017 87.2  78.0 - 100.0 fL Final  . MCH 11/09/2017 28.2  26.0 - 34.0 pg Final  . MCHC 11/09/2017 32.3  30.0 - 36.0 g/dL Final  . RDW 11/09/2017 13.6  11.5 - 15.5 % Final  . Platelets 11/09/2017 133* 150 - 400 K/uL Final   Performed at [facility], [place] [address] [place] [address], [place], [place] [address]  . Sodium 11/09/2017 140  135 - 145 mmol/L Final  . Potassium 11/09/2017 3.9  3.5 - 5.1 mmol/L Final  . Chloride 11/09/2017 107  98 - 111 mmol/L Final  . CO2 11/09/2017 25  22 - 32 mmol/L Final  . Glucose, Bld 11/09/2017 94  70 - 99 mg/dL Final  . BUN 11/09/2017 11  8 - 23 mg/dL Final  .  Creatinine, Ser 11/09/2017 0.75  0.44 - 1.00 mg/dL Final  . Calcium 11/09/2017 8.6* 8.9 - 10.3 mg/dL Final  . GFR calc non Af Amer 11/09/2017 >60  >60 mL/min Final  . GFR calc Af Amer 11/09/2017 >60  >60 mL/min Final   Comment: (NOTE) The eGFR has been calculated using the CKD EPI equation. This calculation has not been validated in all clinical situations. eGFR's persistently <60 mL/min signify possible Chronic Kidney Disease.   Georgiann Hahn gap 11/09/2017 8  5 - 15 Final   Performed at [facility], [place] [address] [place], [place] [address]  Hospital Outpatient Visit on 10/31/2017  Component Date Value Ref Range Status  . MRSA, PCR 10/31/2017 NEGATIVE  NEGATIVE Final  . Staphylococcus aureus 10/31/2017 NEGATIVE  NEGATIVE Final   Comment: (NOTE) The Xpert SA Assay (FDA approved for NASAL specimens in patients 25 years of age and older), is one component of a comprehensive surveillance program. It is not intended to diagnose infection nor to guide or monitor  treatment. Performed at [facility], [place] [address] [place], [place] [address]   . aPTT 10/31/2017 35  24 - 36 seconds Final   Performed at [facility], [place] [address], [place], [place] [address]  . WBC 10/31/2017 4.3  4.0 - 10.5 K/uL Final  . RBC 10/31/2017 4.37  3.87 - 5.11 MIL/uL Final  . Hemoglobin 10/31/2017 12.3  12.0 - 15.0 g/dL Final  . HCT 10/31/2017 37.6  36.0 - 46.0 % Final  . MCV 10/31/2017 86.0  78.0 - 100.0 fL Final  . MCH 10/31/2017 28.1  26.0 - 34.0 pg Final  . MCHC 10/31/2017 32.7  30.0 - 36.0 g/dL Final  . RDW 10/31/2017 13.6  11.5 - 15.5 % Final  . Platelets 10/31/2017 185  150 - 400 K/uL Final   Performed at [facility], [place] [address] [place], [place] [address]  . Sodium 10/31/2017 142  135 - 145 mmol/L Final  . Potassium 10/31/2017 5.1  3.5 - 5.1 mmol/L Final  . Chloride 10/31/2017 104  98 - 111 mmol/L Final  . CO2 10/31/2017 29  22 - 32 mmol/L Final  . Glucose, Bld 10/31/2017 87  70 - 99 mg/dL Final  . BUN 10/31/2017 16  8 - 23 mg/dL Final  . Creatinine, Ser 10/31/2017 1.01* 0.44 - 1.00 mg/dL Final  . Calcium 10/31/2017 9.9  8.9 - 10.3 mg/dL Final  . Total Protein 10/31/2017 7.1  6.5 - 8.1 g/dL Final  . Albumin 10/31/2017 4.4  3.5 - 5.0 g/dL Final  . AST 10/31/2017 20  15 - 41 U/L Final  . ALT 10/31/2017 17  0 - 44 U/L Final  . Alkaline Phosphatase 10/31/2017 58  38 - 126 U/L Final  . Total Bilirubin 10/31/2017 0.8  0.3 - 1.2 mg/dL Final  . GFR calc non Af Amer 10/31/2017 55* >60 mL/min Final  . GFR calc Af Amer 10/31/2017 >60  >60 mL/min Final   Comment: (NOTE) The eGFR has been calculated using the CKD EPI equation. This calculation has not been validated in all clinical situations. eGFR's persistently <60 mL/min signify possible Chronic Kidney Disease.   Georgiann Hahn gap 10/31/2017 9  5 - 15 Final   Performed at [facility], [place] [address], [place], [place] [address]  .  Prothrombin Time 10/31/2017 13.4  11.4 - 15.2 seconds Final  . INR 10/31/2017 1.03   Final   Performed at  [facility], [place] [address], [place], [place] [address]  . ABO/RH(D) 10/31/2017 O POS   Final  . Antibody Screen 10/31/2017 NEG   Final  . Sample Expiration 10/31/2017 11/10/2017   Final  . Extend sample reason 10/31/2017    Final                   Value:NO TRANSFUSIONS OR PREGNANCY IN THE PAST 3 MONTHS Performed at [facility], [place] [address], [place], [place] [address]   Abstract on 09/25/2017  Component Date Value Ref Range Status  . HM Mammogram 09/14/2017 0-4 Bi-Rad  0-4 Bi-Rad, Self Reported Normal Final     X-Rays:Dg Pelvis Portable  Result Date: 11/07/2017 CLINICAL DATA:  Status post left hip arthroplasty. EXAM: PORTABLE PELVIS 1-2 VIEWS COMPARISON:  Radiograph of January 13, 2015. FINDINGS: There is no evidence of pelvic fracture or diastasis. No pelvic bone lesions are seen. Interval placement of left hip arthroplasty. Left femoral and acetabular components are well situated. Previous right hip arthroplasty is again noted. IMPRESSION: Interval placement of left hip total arthroplasty. Electronically Signed   By: Marijo Conception, M.D.   On: 11/07/2017 12:03   Dg Chest Port 1 View  Result Date: 11/10/2017 CLINICAL DATA:  Fever, post left hip replacement 2 days ago. EXAM: PORTABLE CHEST 1 VIEW COMPARISON:  Radiograph 05/12/2017 FINDINGS: The cardiomediastinal contours are normal. The lungs are clear. Pulmonary vasculature is normal. No consolidation, pleural effusion, or pneumothorax. No acute osseous abnormalities are seen. IMPRESSION: No active disease.  Specifically no focal airspace disease. Electronically Signed   By: Jeb Levering M.D.   On: 11/10/2017 00:10   Dg C-arm 1-60 Min-no Report  Result Date: 11/07/2017 Fluoroscopy was utilized by the requesting physician.  No radiographic interpretation.    EKG: Orders placed or  performed during the hospital encounter of 10/31/17  . EKG 12 lead  . EKG 12 lead     Hospital Course: Maria Schneider is a 72 y.o. who was admitted to [facility]. They were brought to the operating room on 11/07/2017 and underwent Procedure(s): LEFT TOTAL HIP ARTHROPLASTY ANTERIOR APPROACH.  Patient tolerated the procedure well and was later transferred to the recovery room and then to the orthopaedic floor for postoperative care. They were given PO and IV analgesics for pain control following their surgery. They were given 24 hours of postoperative antibiotics of  Anti-infectives (From admission, onward)   Start     Dose/Rate Route Frequency Ordered Stop   11/07/17 1600  ceFAZolin (ANCEF) IVPB 1 g/50 mL premix     1 g 100 mL/hr over 30 Minutes Intravenous Every 6 hours 11/07/17 1230 11/07/17 2320   11/07/17 0800  ceFAZolin (ANCEF) IVPB 2g/100 mL premix     2 g 200 mL/hr over 30 Minutes Intravenous On call to O.R. 11/07/17 9983 11/07/17 1023     and started on DVT prophylaxis in the form of Aspirin.   PT and OT were ordered for total joint protocol. Discharge planning consulted to help with postop disposition and equipment needs. Patient had a good night on the evening of surgery. They started to get up OOB with therapy on POD #0. Hemovac drain was pulled without difficulty on day one. Continued to work with therapy into POD #2. Pt was seen during rounds on day two and was ready to go home pending progress with therapy. Dressing was changed and the incision was clean, dry, and intact with no  drainage. Pt worked with therapy for one additional session and was meeting their goals. She was discharged to home later that day in stable condition.  Diet: Regular diet Activity: WBAT Follow-up: in 2 weeks with Dr. Wynelle Link Disposition: Home with home exercise program Discharged Condition: stable   Discharge Instructions    Call MD / Call 911   Complete by:  As directed    If you  experience chest pain or shortness of breath, CALL 911 and be transported to the hospital emergency room.  If you develope a fever above 101 F, pus (white drainage) or increased drainage or redness at the wound, or calf pain, call your surgeon's office.   Change dressing   Complete by:  As directed    You may change your dressing on Friday (11/09/2017), then change the dressing daily with sterile 4 x 4 inch gauze dressing and paper tape.   Constipation Prevention   Complete by:  As directed    Drink plenty of fluids.  Prune juice may be helpful.  You may use a stool softener, such as Colace (over the counter) 100 mg twice a day.  Use MiraLax (over the counter) for constipation as needed.   Diet - low sodium heart healthy   Complete by:  As directed    Discharge instructions   Complete by:  As directed    Dr. Gaynelle Arabian Total Joint Specialist [facility] [address] [place], [place] [address] [phone]  ANTERIOR APPROACH TOTAL HIP REPLACEMENT POSTOPERATIVE DIRECTIONS   Hip Rehabilitation, Guidelines Following Surgery  The results of a hip operation are greatly improved after range of motion and muscle strengthening exercises. Follow all safety measures which are given to protect your hip. If any of these exercises cause increased pain or swelling in your joint, decrease the amount until you are comfortable again. Then slowly increase the exercises. Call your caregiver if you have problems or questions.   HOME CARE INSTRUCTIONS  Remove items at home which could result in a fall. This includes throw rugs or furniture in walking pathways.  ICE to the affected hip every three hours for 30 minutes at a time and then as needed for pain and swelling.  Continue to use ice on the hip for pain and swelling from surgery. You may notice swelling that will progress down to the foot and ankle.  This is normal after surgery.  Elevate the leg when you are not up walking on it.     Continue to use the breathing machine which will help keep your temperature down.  It is common for your temperature to cycle up and down following surgery, especially at night when you are not up moving around and exerting yourself.  The breathing machine keeps your lungs expanded and your temperature down.  DIET You may resume your previous home diet once your are discharged from the hospital.  DRESSING / WOUND CARE / SHOWERING You may shower 3 days after surgery, but keep the wounds dry during showering.  You may use an occlusive plastic wrap (Press'n Seal for example), NO SOAKING/SUBMERGING IN THE BATHTUB.  If the bandage gets wet, change with a clean dry gauze.  If the incision gets wet, pat the wound dry with a clean towel. You may start showering once you are discharged home but do not submerge the incision under water. Just pat the incision dry and apply a dry gauze dressing on daily. Change the surgical dressing daily and reapply a  dry dressing each time.  ACTIVITY Walk with your walker as instructed. Use walker as long as suggested by your caregivers. Avoid periods of inactivity such as sitting longer than an hour when not asleep. This helps prevent blood clots.  You may resume a sexual relationship in one month or when given the OK by your doctor.  You may return to work once you are cleared by your doctor.  Do not drive a car for 6 weeks or until released by you surgeon.  Do not drive while taking narcotics.  WEIGHT BEARING Weight bearing as tolerated with assist device (walker, cane, etc) as directed, use it as long as suggested by your surgeon or therapist, typically at least 4-6 weeks.  POSTOPERATIVE CONSTIPATION PROTOCOL Constipation - defined medically as fewer than three stools per week and severe constipation as less than one stool per week.  One of the most common issues patients have following surgery is constipation.  Even if you have a regular bowel pattern at home,  your normal regimen is likely to be disrupted due to multiple reasons following surgery.  Combination of anesthesia, postoperative narcotics, change in appetite and fluid intake all can affect your bowels.  In order to avoid complications following surgery, here are some recommendations in order to help you during your recovery period.  Colace (docusate) - Pick up an over-the-counter form of Colace or another stool softener and take twice a day as long as you are requiring postoperative pain medications.  Take with a full glass of water daily.  If you experience loose stools or diarrhea, hold the colace until you stool forms back up.  If your symptoms do not get better within 1 week or if they get worse, check with your doctor.  Dulcolax (bisacodyl) - Pick up over-the-counter and take as directed by the product packaging as needed to assist with the movement of your bowels.  Take with a full glass of water.  Use this product as needed if not relieved by Colace only.   MiraLax (polyethylene glycol) - Pick up over-the-counter to have on hand.  MiraLax is a solution that will increase the amount of water in your bowels to assist with bowel movements.  Take as directed and can mix with a glass of water, juice, soda, coffee, or tea.  Take if you go more than two days without a movement. Do not use MiraLax more than once per day. Call your doctor if you are still constipated or irregular after using this medication for 7 days in a row.  If you continue to have problems with postoperative constipation, please contact the office for further assistance and recommendations.  If you experience "the worst abdominal pain ever" or develop nausea or vomiting, please contact the office immediatly for further recommendations for treatment.  ITCHING  If you experience itching with your medications, try taking only a single pain pill, or even half a pain pill at a time.  You can also use Benadryl over the counter for  itching or also to help with sleep.   TED HOSE STOCKINGS Wear the elastic stockings on both legs for three weeks following surgery during the day but you may remove then at night for sleeping.  MEDICATIONS See your medication summary on the "After Visit Summary" that the nursing staff will review with you prior to discharge.  You may have some home medications which will be placed on hold until you complete the course of blood thinner medication.  It is  important for you to complete the blood thinner medication as prescribed by your surgeon.  Continue your approved medications as instructed at time of discharge.  PRECAUTIONS If you experience chest pain or shortness of breath - call 911 immediately for transfer to the hospital emergency department.  If you develop a fever greater that 101 F, purulent drainage from wound, increased redness or drainage from wound, foul odor from the wound/dressing, or calf pain - CONTACT YOUR SURGEON.                                                   FOLLOW-UP APPOINTMENTS Make sure you keep all of your appointments after your operation with your surgeon and caregivers. You should call the office at the above phone number and make an appointment for approximately two weeks after the date of your surgery or on the date instructed by your surgeon outlined in the "After Visit Summary".  RANGE OF MOTION AND STRENGTHENING EXERCISES  These exercises are designed to help you keep full movement of your hip joint. Follow your caregiver's or physical therapist's instructions. Perform all exercises about fifteen times, three times per day or as directed. Exercise both hips, even if you have had only one joint replacement. These exercises can be done on a training (exercise) mat, on the floor, on a table or on a bed. Use whatever works the best and is most comfortable for you. Use music or television while you are exercising so that the exercises are a pleasant break in your day.  This will make your life better with the exercises acting as a break in routine you can look forward to.  Lying on your back, slowly slide your foot toward your buttocks, raising your knee up off the floor. Then slowly slide your foot back down until your leg is straight again.  Lying on your back spread your legs as far apart as you can without causing discomfort.  Lying on your side, raise your upper leg and foot straight up from the floor as far as is comfortable. Slowly lower the leg and repeat.  Lying on your back, tighten up the muscle in the front of your thigh (quadriceps muscles). You can do this by keeping your leg straight and trying to raise your heel off the floor. This helps strengthen the largest muscle supporting your knee.  Lying on your back, tighten up the muscles of your buttocks both with the legs straight and with the knee bent at a comfortable angle while keeping your heel on the floor.   IF YOU ARE TRANSFERRED TO A SKILLED REHAB FACILITY If the patient is transferred to a skilled rehab facility following release from the hospital, a list of the current medications will be sent to the facility for the patient to continue.  When discharged from the skilled rehab facility, please have the facility set up the patient's [place] prior to being released. Also, the skilled facility will be responsible for providing the patient with their medications at time of release from the facility to include their pain medication, the muscle relaxants, and their blood thinner medication. If the patient is still at the rehab facility at time of the two week follow up appointment, the skilled rehab facility will also need to assist the patient in arranging follow up appointment in our  office and any transportation needs.  MAKE SURE YOU:  Understand these instructions.  Get help right away if you are not doing well or get worse.    Pick up stool softner and laxative for home use  following surgery while on pain medications. Do not submerge incision under water. Please use good hand washing techniques while changing dressing each day. May shower starting three days after surgery. Please use a clean towel to pat the incision dry following showers. Continue to use ice for pain and swelling after surgery. Do not use any lotions or creams on the incision until instructed by your surgeon.   Do not sit on low chairs, stoools or toilet seats, as it may be difficult to get up from low surfaces   Complete by:  As directed    Driving restrictions   Complete by:  As directed    No driving for two weeks   TED hose   Complete by:  As directed    Use stockings (TED hose) for three weeks on both leg(s).  You may remove them at night for sleeping.   Weight bearing as tolerated   Complete by:  As directed      Allergies as of 11/09/2017      Reactions   Ace Inhibitors    REACTION: cough   Codeine Nausea And Vomiting   Fentanyl Itching   Statins    REACTION: MYALGIAS-muscle pain      Medication List    TAKE these medications   aspirin 325 MG EC tablet Take 1 tablet (325 mg total) by mouth 2 (two) times daily for 19 days. Take one tablet (325 mg) Aspirin two times a day for three weeks following surgery. Then take one baby Aspirin (81 mg) once a day for three weeks. Then discontinue aspirin.   buPROPion 300 MG 24 hr tablet Commonly known as:  WELLBUTRIN XL Take 300 mg by mouth daily.   esomeprazole 20 MG capsule Commonly known as:  NEXIUM Take 20 mg by mouth daily.   ezetimibe 10 MG tablet Commonly known as:  ZETIA Take 1 tablet (10 mg total) by mouth daily.   fenofibrate 160 MG tablet TAKE 1 TABLET BY MOUTH EVERY DAY Notes to patient:  Take as prescribed   HYDROmorphone 2 MG tablet Commonly known as:  DILAUDID Take 1 tablet (2 mg total) by mouth every 6 (six) hours as needed for moderate pain or severe pain.   losartan 50 MG tablet Commonly known as:   COZAAR TAKE 1 TABLET BY MOUTH EVERY DAY   methocarbamol 500 MG tablet Commonly known as:  ROBAXIN Take 1 tablet (500 mg total) by mouth every 6 (six) hours as needed for muscle spasms.   metoprolol succinate 25 MG 24 hr tablet Commonly known as:  TOPROL-XL TAKE 1 TABLET BY MOUTH EVERY DAY   MYRBETRIQ 50 MG Tb24 tablet Generic drug:  mirabegron ER Take 50 mg by mouth daily.   sertraline 100 MG tablet Commonly known as:  ZOLOFT Take 200 mg by mouth daily.   VITAMIN D PO Take 5,000 Units by mouth daily. Reported on 06/09/2015            Discharge Care Instructions  (From admission, onward)         Start     Ordered   11/08/17 0000  Weight bearing as tolerated     11/08/17 0808   11/08/17 0000  Change dressing    Comments:  You may change your  dressing on Friday (11/09/2017), then change the dressing daily with sterile 4 x 4 inch gauze dressing and paper tape.   11/08/17 0808         Follow-up Information    Gaynelle Arabian, MD. Schedule an appointment as soon as possible for a visit on 11/22/2017.   Specialty:  Orthopedic Surgery Contact information: [address] [place] [address] [phone]           Signed: Theresa Duty, PA-C Orthopedic Surgery 11/12/2017, 7:08 AM

## 2017-11-13 ENCOUNTER — Telehealth: Payer: Self-pay | Admitting: Emergency Medicine

## 2017-11-13 NOTE — Telephone Encounter (Signed)
Post ED Visit - Positive Culture Follow-up  Culture report reviewed by antimicrobial stewardship pharmacist:  []  Elenor Quinones, Pharm.D. []  Heide Guile, Pharm.D., BCPS AQ-ID []  Parks Neptune, Pharm.D., BCPS []  Alycia Rossetti, Pharm.D., BCPS []  [place], Pharm.D., BCPS, AAHIVP []  Legrand Como, Pharm.D., BCPS, AAHIVP [x]  Salome Arnt, PharmD, BCPS []  Johnnette Gourd, PharmD, BCPS []  Hughes Better, PharmD, BCPS []  Leeroy Cha, PharmD  Positive urine culture Treated with none, asymptomatic, organism sensitive to the same and no further patient follow-up is required at this time.  Hazle Nordmann 11/13/2017, 10:36 AM

## 2017-11-15 LAB — CULTURE, BLOOD (ROUTINE X 2)
Culture: NO GROWTH
Culture: NO GROWTH
SPECIAL REQUESTS: ADEQUATE
SPECIAL REQUESTS: ADEQUATE

## 2017-11-22 ENCOUNTER — Ambulatory Visit: Payer: Medicare Other | Admitting: Family Medicine

## 2017-11-26 ENCOUNTER — Encounter: Payer: Self-pay | Admitting: Family Medicine

## 2017-11-26 ENCOUNTER — Ambulatory Visit (INDEPENDENT_AMBULATORY_CARE_PROVIDER_SITE_OTHER): Payer: Medicare Other | Admitting: Family Medicine

## 2017-11-26 VITALS — BP 124/80 | HR 74 | Temp 98.3°F | Ht 65.0 in | Wt 165.6 lb

## 2017-11-26 DIAGNOSIS — N390 Urinary tract infection, site not specified: Secondary | ICD-10-CM | POA: Diagnosis not present

## 2017-11-26 DIAGNOSIS — Z96642 Presence of left artificial hip joint: Secondary | ICD-10-CM

## 2017-11-26 DIAGNOSIS — M6740 Ganglion, unspecified site: Secondary | ICD-10-CM

## 2017-11-26 DIAGNOSIS — R740 Nonspecific elevation of levels of transaminase and lactic acid dehydrogenase [LDH]: Secondary | ICD-10-CM | POA: Diagnosis not present

## 2017-11-26 DIAGNOSIS — R7401 Elevation of levels of liver transaminase levels: Secondary | ICD-10-CM

## 2017-11-26 MED ORDER — CIPROFLOXACIN HCL 500 MG PO TABS: 500.0000 mg | ORAL_TABLET | Freq: Two times a day (BID) | ORAL | 0 refills | Status: DC

## 2017-11-26 NOTE — Progress Notes (Signed)
   Subjective:    Patient ID: Maria Schneider, female    DOB: 1945/11/17, 72 y.o.   MRN: [id number]  HPI Here to follow up a hospital stay from 11-07-17 to 11-09-17 for a left hip total replacement per Dr. Wynelle Link. The surgery went well and she is progressing with PT. Of note she developed a fever right after the surgery and this went away after 36 hours, but no etiology was found. I see that a urine culture grew out Providencia stuartii after she went home. Today she denies any urinary symptoms. In addition she had some elevated transaminases while in the hospital with no explanation as to why, although this could have been the result of anesthesia. She also asks me to check a lump on the right elbow. She recently noticed this. It does not bother her at all.   Review of Systems  Constitutional: Negative.   Respiratory: Negative.   Gastrointestinal: Negative.   Genitourinary: Negative.   Neurological: Negative.        Objective:   Physical Exam  Constitutional: She is oriented to person, place, and time. She appears well-developed and well-nourished.  Cardiovascular: Normal rate, regular rhythm, normal heart sounds and intact distal pulses.  Pulmonary/Chest: Effort normal and breath sounds normal. No stridor. No respiratory distress. She has no wheezes. She has no rales.  Abdominal: Soft. Bowel sounds are normal. She exhibits no distension and no mass. There is no tenderness. There is no rebound and no guarding. No hernia.  Musculoskeletal:  The right elbow has a 1 cm round firm mobile non-tender mass over the lateral epicondyle   Neurological: She is alert and oriented to person, place, and time.          Assessment & Plan:  She is doing well after hip surgery. She will follow up wit Dr. Wynelle Link. She apparently has a UTI so we will treat this with Cipro. The lump on the elbow is a ganglion cyst and we agreed to watch this for the time being. We will repeat a set of liver enzymes today.    [name], MD

## 2017-11-27 LAB — HEPATIC FUNCTION PANEL
ALK PHOS: 127 U/L — AB (ref 39–117)
ALT: 10 U/L (ref 0–35)
AST: 13 U/L (ref 0–37)
Albumin: 4.2 g/dL (ref 3.5–5.2)
BILIRUBIN DIRECT: 0.1 mg/dL (ref 0.0–0.3)
Total Bilirubin: 0.4 mg/dL (ref 0.2–1.2)
Total Protein: 7 g/dL (ref 6.0–8.3)

## 2017-11-28 ENCOUNTER — Encounter: Payer: Self-pay | Admitting: *Deleted

## 2017-12-24 ENCOUNTER — Telehealth: Payer: Self-pay | Admitting: Family Medicine

## 2017-12-24 NOTE — Telephone Encounter (Signed)
No mention in last OV whether TD or Tdap is preferred.  Will check with PCP.

## 2017-12-24 NOTE — Telephone Encounter (Signed)
Dr. Sarajane Jews please advise on td or tdap for the pt.  Thanks

## 2017-12-24 NOTE — Telephone Encounter (Signed)
Copied from [place] [id number]. Topic: Appointment Scheduling - Scheduling Inquiry for Clinic >> Dec 24, 2017  2:49 PM Margot Ables wrote: Reason for CRM: pt requesting to come in for flu shot and tdap. She said she was advised at [facility] 9/9 to come back at a later date to have them done. Please advise if ok for tdap with nurse.

## 2017-12-25 NOTE — Telephone Encounter (Signed)
Please give a TDaP

## 2017-12-27 NOTE — Telephone Encounter (Signed)
I have called and lmom for the pt to call to schedule nurse visit for flu and tdap to be done per Dr. Sarajane Jews

## 2018-01-03 ENCOUNTER — Other Ambulatory Visit: Payer: Self-pay | Admitting: Family Medicine

## 2018-01-03 ENCOUNTER — Ambulatory Visit (INDEPENDENT_AMBULATORY_CARE_PROVIDER_SITE_OTHER): Payer: Medicare Other | Admitting: *Deleted

## 2018-01-03 ENCOUNTER — Ambulatory Visit: Payer: Medicare Other | Admitting: *Deleted

## 2018-01-03 DIAGNOSIS — Z23 Encounter for immunization: Secondary | ICD-10-CM | POA: Diagnosis not present

## 2018-01-03 NOTE — Progress Notes (Signed)
Per orders of Dr. Sarajane Jews, injection of influenza and tdap given by Westley Hummer. Patient tolerated injection well.

## 2018-01-04 MED ORDER — EZETIMIBE 10 MG PO TABS: 10.0000 mg | ORAL_TABLET | Freq: Every day | ORAL | 0 refills | Status: DC

## 2018-01-04 NOTE — Addendum Note (Signed)
Addended by: Alan Ripper on: 01/04/2018 10:54 AM   Modules accepted: Orders

## 2018-01-04 NOTE — Telephone Encounter (Signed)
Requested medication (s) are due for refill today: yes  Requested medication (s) are on the active medication list: yes  Last refill:  01/04/18  Future visit scheduled: no}  Notes to clinic:       Requested Prescriptions  Pending Prescriptions Disp Refills   ezetimibe (ZETIA) 10 MG tablet 90 tablet 3    Sig: Take 1 tablet (10 mg total) by mouth daily.     Cardiovascular:  Antilipid - Sterol Transport Inhibitors Failed - 01/04/2018 10:54 AM      Failed - Total Cholesterol in normal range and within 360 days    Cholesterol  Date Value Ref Range Status  02/18/2016 178 0 - 200 mg/dL Final    Comment:    ATP III Classification       Desirable:  < 200 mg/dL               Borderline High:  200 - 239 mg/dL          High:  > = 240 mg/dL         Failed - LDL in normal range and within 360 days    LDL Cholesterol  Date Value Ref Range Status  02/18/2016 88 0 - 99 mg/dL Final         Failed - HDL in normal range and within 360 days    HDL  Date Value Ref Range Status  02/18/2016 61.80 >39.00 mg/dL Final         Failed - Triglycerides in normal range and within 360 days    Triglycerides  Date Value Ref Range Status  02/18/2016 141.0 0.0 - 149.0 mg/dL Final    Comment:    Normal:  <150 mg/dLBorderline High:  150 - 199 mg/dL         Passed - Valid encounter within last 12 months    Recent Outpatient Visits          1 month ago Status post total replacement of left hip   [facility]rap[facility], [facility] at [facility], Ishmael Holter, MD   5 months ago Right flank pain   [facility] [place], MD   6 months ago Thoracic myofascial strain, initial Education administrator at [facility], Ishmael Holter, MD   7 months ago Acute bronchitis, unspecified organism   Therapist, music at [facility], Ishmael Holter, MD   7 months ago Flu-like symptoms   Therapist, music at [facility], Alinda Sierras, MD           Signed Prescriptions Disp Refills   ezetimibe (ZETIA) 10 MG tablet 90 tablet 3    Sig: TAKE 1 TABLET (10 MG TOTAL) BY MOUTH DAILY.     Cardiovascular:  Antilipid - Sterol Transport Inhibitors Failed - 01/04/2018  9:00 AM      Failed - Total Cholesterol in normal range and within 360 days    Cholesterol  Date Value Ref Range Status  02/18/2016 178 0 - 200 mg/dL Final    Comment:    ATP III Classification       Desirable:  < 200 mg/dL               Borderline High:  200 - 239 mg/dL          High:  > = 240 mg/dL         Failed - LDL in normal range and within 360 days    LDL Cholesterol  Date Value Ref Range Status  02/18/2016 88 0 - 99 mg/dL Final         Failed - HDL in normal range and within 360 days    HDL  Date Value Ref Range Status  02/18/2016 61.80 >39.00 mg/dL Final         Failed - Triglycerides in normal range and within 360 days    Triglycerides  Date Value Ref Range Status  02/18/2016 141.0 0.0 - 149.0 mg/dL Final    Comment:    Normal:  <150 mg/dLBorderline High:  150 - 199 mg/dL         Passed - Valid encounter within last 12 months    Recent Outpatient Visits          1 month ago Status post total replacement of left hip   Therapist, music at [facility], Ishmael Holter, MD   5 months ago Right flank pain   [facility] [place], MD   6 months ago Thoracic myofascial strain, initial Education administrator at [place], MD   7 months ago Acute bronchitis, unspecified organism   Therapist, music at [place], MD   7 months ago Flu-like symptoms   Therapist, music at [facility], Alinda Sierras, MD

## 2018-01-04 NOTE — Telephone Encounter (Signed)
Pt called stating that she requested medication thru her pharmacy over a week ago - she called last night to see if it was ready and they advised not received - I advised pt they did not send [place] request last week - pt is leaving town and needing to pick up today. Pharmacy error in not sending request - sending as urgent.  [facility] [address]

## 2018-01-23 ENCOUNTER — Other Ambulatory Visit: Payer: Self-pay | Admitting: Family Medicine

## 2018-02-04 ENCOUNTER — Other Ambulatory Visit: Payer: Self-pay | Admitting: Family Medicine

## 2018-05-07 ENCOUNTER — Telehealth: Payer: Self-pay | Admitting: Family Medicine

## 2018-05-07 MED ORDER — LOSARTAN POTASSIUM 100 MG PO TABS: ORAL_TABLET | ORAL | 3 refills | Status: DC

## 2018-05-07 NOTE — Telephone Encounter (Signed)
Call in Losartan 100 mg and she can take 1/2 tablet daily

## 2018-05-07 NOTE — Telephone Encounter (Signed)
[facility] from [facility] that the losartan 50 mg  Is on backorder and they will need an alternative for this pt.  I did call the pharmacy to verify this.  Dr. Sarajane Jews please advise. Thanks

## 2018-05-27 ENCOUNTER — Telehealth: Payer: Self-pay | Admitting: Family Medicine

## 2018-05-27 MED ORDER — VALSARTAN 80 MG PO TABS: 80.0000 mg | ORAL_TABLET | Freq: Every day | ORAL | 3 refills | Status: DC

## 2018-05-27 NOTE — Telephone Encounter (Signed)
Copied from [place] [id number]. Topic: Quick Communication - Rx Refill/Question >> May 27, 2018  2:42 PM [place], [place] D wrote: Medication: losartan (COZAAR) 100 MG tablet / Pt was told that losartan is on backorder [facility] all [facility] and they do not know when it will be back in stock. She would like to know if something can be sent in in it's place. Please advise.  Has the patient contacted their pharmacy? Yes.   (Agent: If no, request that the patient contact the pharmacy for the refill.) (Agent: If yes, when and what did the pharmacy advise?)  Preferred Pharmacy (with phone number or street name): [id number] [address], [place] [phone] (Phone) [phone] (Fax)  Agent: Please be advised that RX refills may take up to 3 business days. We ask that you follow-up with your pharmacy.

## 2018-05-27 NOTE — Telephone Encounter (Signed)
Dr. Fry please advise. Thanks  

## 2018-05-27 NOTE — Telephone Encounter (Signed)
Stop Losartan and call in Valsartan 80 mg daily, #90 with 3 rf

## 2018-05-27 NOTE — Telephone Encounter (Signed)
Medication: losartan (COZAAR) 100 MG tablet   Pt reports losartan is on backorder at all [facility] locations and they do not know when it will be back in stock. She would like to know if something can be sent in in it's place. Please advise. [facility] [place]

## 2018-05-27 NOTE — Telephone Encounter (Signed)
Called and spoke with pt and she is aware of med change

## 2018-08-19 ENCOUNTER — Ambulatory Visit: Payer: Self-pay | Admitting: *Deleted

## 2018-08-19 NOTE — Telephone Encounter (Signed)
   Reason for Disposition . [1] Chest pain lasts > 5 minutes AND [2] age > 42    Patient has been having this pain since she fell down the stairs- she has to stop and rest at times because of it. Patient states it is medial- but sometimes it is to the left. She is concerned because it has not gotten better. Due to her symptoms- protocol directs her to ED - advised if she is having heart related symptoms she needs to be evaluated and should not wait as she has been doing. Patient is unable to get her BP cuff to work properly- so she can not get BP reading.  Answer Assessment - Initial Assessment Questions 1. LOCATION: "Where does it hurt?"       Breastbone and to esophageal area- sometimes to the left 2. RADIATION: "Does the pain go anywhere else?" (e.g., into neck, jaw, arms, back)     All located in the middle 3. ONSET: "When did the chest pain begin?" (Minutes, hours or days)      4 weeks ago- tightness- does not keep her from doing anything 4. PATTERN "Does the pain come and go, or has it been constant since it started?"  "Does it get worse with exertion?"      Comes and goes as far as severity, tightness does get worse 5. DURATION: "How long does it last" (e.g., seconds, minutes, hours)     Nonstop- patient will stop and rest - couple minutes and it will settle down 6. SEVERITY: "How bad is the pain?"  (e.g., Scale 1-10; mild, moderate, or severe)    - MILD (1-3): doesn't interfere with normal activities     - MODERATE (4-7): interferes with normal activities or awakens from sleep    - SEVERE (8-10): excruciating pain, unable to do any normal activities       2-3 7. CARDIAC RISK FACTORS: "Do you have any history of heart problems or risk factors for heart disease?" (e.g., prior heart attack, angina; high blood pressure, diabetes, being overweight, high cholesterol, smoking, or strong family history of heart disease)     High blood pressure, high cholesterol, over weight 8. PULMONARY RISK  FACTORS: "Do you have any history of lung disease?"  (e.g., blood clots in lung, asthma, emphysema, birth control pills)     No- former smoker 9. CAUSE: "What do you think is causing the chest pain?"     stress 10. OTHER SYMPTOMS: "Do you have any other symptoms?" (e.g., dizziness, nausea, vomiting, sweating, fever, difficulty breathing, cough)       no 11. PREGNANCY: "Is there any chance you are pregnant?" "When was your last menstrual period?"       n/a  Protocols used: CHEST PAIN-A-AH

## 2018-08-20 ENCOUNTER — Emergency Department (HOSPITAL_COMMUNITY): Payer: Medicare Other

## 2018-08-20 ENCOUNTER — Other Ambulatory Visit: Payer: Self-pay

## 2018-08-20 ENCOUNTER — Emergency Department (HOSPITAL_COMMUNITY)
Admission: EM | Admit: 2018-08-20 | Discharge: 2018-08-20 | Disposition: A | Discharge status: Home / Self Care | Payer: Medicare Other | Attending: Emergency Medicine | Admitting: Emergency Medicine

## 2018-08-20 ENCOUNTER — Encounter (HOSPITAL_COMMUNITY): Payer: Self-pay | Admitting: Emergency Medicine

## 2018-08-20 DIAGNOSIS — R0789 Other chest pain: Secondary | ICD-10-CM | POA: Diagnosis not present

## 2018-08-20 DIAGNOSIS — Z85828 Personal history of other malignant neoplasm of skin: Secondary | ICD-10-CM | POA: Diagnosis not present

## 2018-08-20 DIAGNOSIS — J45909 Unspecified asthma, uncomplicated: Secondary | ICD-10-CM | POA: Diagnosis not present

## 2018-08-20 DIAGNOSIS — I10 Essential (primary) hypertension: Secondary | ICD-10-CM | POA: Insufficient documentation

## 2018-08-20 DIAGNOSIS — R0602 Shortness of breath: Secondary | ICD-10-CM | POA: Insufficient documentation

## 2018-08-20 DIAGNOSIS — Z87891 Personal history of nicotine dependence: Secondary | ICD-10-CM | POA: Diagnosis not present

## 2018-08-20 DIAGNOSIS — Z79899 Other long term (current) drug therapy: Secondary | ICD-10-CM | POA: Insufficient documentation

## 2018-08-20 LAB — I-STAT TROPONIN, ED
Troponin i, poc: 0 ng/mL (ref 0.00–0.08)
Troponin i, poc: 0 ng/mL (ref 0.00–0.08)

## 2018-08-20 LAB — COMPREHENSIVE METABOLIC PANEL
ALT: 17 U/L (ref 0–44)
AST: 22 U/L (ref 15–41)
Albumin: 4 g/dL (ref 3.5–5.0)
Alkaline Phosphatase: 60 U/L (ref 38–126)
Anion gap: 10 (ref 5–15)
BUN: 20 mg/dL (ref 8–23)
CO2: 24 mmol/L (ref 22–32)
Calcium: 9.7 mg/dL (ref 8.9–10.3)
Chloride: 106 mmol/L (ref 98–111)
Creatinine, Ser: 1.14 mg/dL — ABNORMAL HIGH (ref 0.44–1.00)
GFR calc Af Amer: 56 mL/min — ABNORMAL LOW (ref >60)
GFR calc non Af Amer: 49 mL/min — ABNORMAL LOW (ref >60)
Glucose, Bld: 80 mg/dL (ref 70–99)
Potassium: 4.5 mmol/L (ref 3.5–5.1)
Sodium: 140 mmol/L (ref 135–145)
Total Bilirubin: 0.6 mg/dL (ref 0.3–1.2)
Total Protein: 6.7 g/dL (ref 6.5–8.1)

## 2018-08-20 LAB — CBC WITH DIFFERENTIAL/PLATELET
Abs Immature Granulocytes: 0.01 10*3/uL (ref 0.00–0.07)
Basophils Absolute: 0 10*3/uL (ref 0.0–0.1)
Basophils Relative: 1 %
Eosinophils Absolute: 0.1 10*3/uL (ref 0.0–0.5)
Eosinophils Relative: 2 %
HCT: 38.2 % (ref 36.0–46.0)
Hemoglobin: 12.3 g/dL (ref 12.0–15.0)
Immature Granulocytes: 0 %
Lymphocytes Relative: 21 %
Lymphs Abs: 0.8 10*3/uL (ref 0.7–4.0)
MCH: 27.8 pg (ref 26.0–34.0)
MCHC: 32.2 g/dL (ref 30.0–36.0)
MCV: 86.4 fL (ref 80.0–100.0)
Monocytes Absolute: 0.4 10*3/uL (ref 0.1–1.0)
Monocytes Relative: 9 %
Neutro Abs: 2.6 10*3/uL (ref 1.7–7.7)
Neutrophils Relative %: 67 %
Platelets: 176 10*3/uL (ref 150–400)
RBC: 4.42 MIL/uL (ref 3.87–5.11)
RDW: 12.6 % (ref 11.5–15.5)
WBC: 3.9 10*3/uL — ABNORMAL LOW (ref 4.0–10.5)
nRBC: 0 % (ref 0.0–0.2)

## 2018-08-20 NOTE — ED Provider Notes (Signed)
[facility] EMERGENCY DEPARTMENT Provider Note   CSN: [id number] Arrival date & time: 08/20/18  1105    History   Chief Complaint Chief Complaint  Patient presents with  . Chest Pain    HPI Maria Schneider is a 73 y.o. female history of hyperlipidemia, hypertension, here presenting with chest pressure, palpitations.  Patient states that for the last month or so she has been having intermittent chest pressure as well as palpitations.  Patient also has some subjective shortness of breath after she gets up a flight of stairs.  Patient states that about a month ago she went up to the flight of stairs and felt short of breath and fell down and now has bruising in the right leg.  Patient states that she had continued symptoms so she called her doctor yesterday and was sent here for evaluation.  Patient states that her father died of MI at age 51s.  She has no known CAD.  She states that she had stress test many years ago and her cardiologist has retired already.  She denies any recent travel or history of blood clots.     The history is provided by the patient.    Past Medical History:  Diagnosis Date  . Allergy   . Arthritis   . Asthma   . Cancer ([place])    hx skin cancer  . Depression   . Detrusor instability   . Diverticulosis    not symptomatic  . GERD (gastroesophageal reflux disease)   . History of kidney stones   . History of pancreatitis    April 2015  . History of skin cancer   . Hyperlipidemia   . Hypertension   . Osteopenia   . Urinary incontinence   . Vitamin D deficiency    RESOLVED     Patient Active Problem List   Diagnosis Date Noted  . OA (osteoarthritis) of hip 01/13/2015  . B12 deficiency 01/07/2014  . Bradycardia 01/07/2014  . HYPERLIPIDEMIA 04/12/2009  . DEPRESSION 04/12/2009  . Essential hypertension 04/12/2009  . ALLERGIC RHINITIS 04/12/2009  . ASTHMA 04/12/2009  . GERD 04/12/2009  . URINARY INCONTINENCE 04/12/2009    Past  Surgical History:  Procedure Laterality Date  . ABDOMINAL HYSTERECTOMY  1994   TAH/ BLADDER SUSPENSION  . BREAST BIOPSY  1972   benign  . CHOLECYSTECTOMY N/A 07/12/2013   Procedure: LAPAROSCOPIC CHOLECYSTECTOMY WITH INTRAOPERATIVE CHOLANGIOGRAM;  Surgeon: Edward Jolly, MD;  Location: [facility];  Service: General;  Laterality: N/A;  . COLONOSCOPY  06-24-12   per Dr. Earlean Shawl, benign polyps, repeat in 5 yrs    . ERCP N/A 07/14/2013   Procedure: ENDOSCOPIC RETROGRADE CHOLANGIOPANCREATOGRAPHY (ERCP);  Surgeon: Gatha Mayer, MD;  Location: [facility];  Service: Endoscopy;  Laterality: N/A;  . TONSILLECTOMY    . TOTAL HIP ARTHROPLASTY Right 01/13/2015   Procedure: RIGHT TOTAL HIP ARTHROPLASTY ANTERIOR APPROACH;  Surgeon: Gaynelle Arabian, MD;  Location: [facility];  Service: Orthopedics;  Laterality: Right;  . TOTAL HIP ARTHROPLASTY Left 11/07/2017   Procedure: LEFT TOTAL HIP ARTHROPLASTY ANTERIOR APPROACH;  Surgeon: Gaynelle Arabian, MD;  Location: [facility];  Service: Orthopedics;  Laterality: Left;  . TUBAL LIGATION       OB History    Gravida  2   Para  1   Term  1   Preterm      AB  1   Living  1     SAB  1   TAB  Ectopic      Multiple      Live Births               Home Medications    Prior to Admission medications   Medication Sig Start Date End Date Taking? Authorizing Provider  acetaminophen (TYLENOL) 500 MG tablet Take 1,000 mg by mouth every 6 (six) hours as needed for moderate pain.    [provider]  buPROPion (WELLBUTRIN XL) 300 MG 24 hr tablet Take 300 mg by mouth daily.      [provider]  Cholecalciferol (VITAMIN D PO) Take 5,000 Units by mouth daily. Reported on 06/09/2015    [provider]  ciprofloxacin (CIPRO) 500 MG tablet Take 1 tablet (500 mg total) by mouth 2 (two) times daily. 11/26/17   Laurey Morale, MD  esomeprazole (NEXIUM) 20 MG capsule Take 20 mg by mouth daily.     [provider]  ezetimibe (ZETIA)  10 MG tablet Take 1 tablet (10 mg total) by mouth daily. Please schedule a physical for more refills. thanks 01/04/18   Laurey Morale, MD  fenofibrate 160 MG tablet TAKE 1 TABLET BY MOUTH EVERY DAY 02/21/18   Laurey Morale, MD  metoprolol succinate (TOPROL-XL) 25 MG 24 hr tablet TAKE 1 TABLET BY MOUTH EVERY DAY 02/22/18   Laurey Morale, MD  MYRBETRIQ 50 MG TB24 tablet Take 50 mg by mouth daily. 03/02/16   [provider]  sertraline (ZOLOFT) 100 MG tablet Take 200 mg by mouth daily.     [provider]  valsartan (DIOVAN) 80 MG tablet Take 1 tablet (80 mg total) by mouth daily. 05/27/18   Laurey Morale, MD  DETROL LA 4 MG 24 hr capsule TAKE ONE CAPSULE BY MOUTH EVERY DAY 08/18/11 09/21/12  Bennetta Laos, MD    Family History Family History  Problem Relation Age of Onset  . Hypertension Mother   . Osteoporosis Mother   . Heart disease Father   . Osteoporosis Sister   . Breast cancer Paternal Aunt        Age 18's  . Diabetes Paternal Uncle     Social History Social History   Tobacco Use  . Smoking status: Former Smoker    Years: 42.00    Types: Cigarettes    Last attempt to quit: 07/09/2008    Years since quitting: 10.1  . Smokeless tobacco: Never Used  . Tobacco comment: 9 years ago  Substance Use Topics  . Alcohol use: Yes    Alcohol/week: 2.0 standard drinks    Types: 2 Glasses of wine per week    Comment: OCC  . Drug use: No     Allergies   Ace inhibitors; Codeine; Fentanyl; and Statins   Review of Systems Review of Systems  Cardiovascular: Positive for chest pain.  All other systems reviewed and are negative.    Physical Exam Updated Vital Signs BP 139/74   Pulse 66   Temp 97.9 F (36.6 C) (Oral)   Resp 17   Wt 74.8 kg   SpO2 98%   BMI 27.46 kg/m   Physical Exam Vitals signs and nursing note reviewed.  Constitutional:      Appearance: She is well-developed.  HENT:     Head: Normocephalic.  Eyes:     Extraocular Movements:  Extraocular movements intact.     Pupils: Pupils are equal, round, and reactive to light.  Neck:     Musculoskeletal: Normal range of  motion and neck supple.  Cardiovascular:     Rate and Rhythm: Normal rate and regular rhythm.     Heart sounds: Normal heart sounds.  Pulmonary:     Effort: Pulmonary effort is normal.     Breath sounds: Normal breath sounds.  Abdominal:     General: Bowel sounds are normal.     Palpations: Abdomen is soft.  Musculoskeletal: Normal range of motion.     Right lower leg: No edema.     Left lower leg: No edema.  Skin:    General: Skin is warm.  Neurological:     General: No focal deficit present.     Mental Status: She is alert.  Psychiatric:        Mood and Affect: Mood normal.        Behavior: Behavior normal.      ED Treatments / Results  Labs (all labs ordered are listed, but only abnormal results are displayed) Labs Reviewed  CBC WITH DIFFERENTIAL/PLATELET - Abnormal; Notable for the following components:      Result Value   WBC 3.9 (*)    All other components within normal limits  COMPREHENSIVE METABOLIC PANEL - Abnormal; Notable for the following components:   Creatinine, Ser 1.14 (*)    GFR calc non Af Amer 49 (*)    GFR calc Af Amer 56 (*)    All other components within normal limits  I-STAT TROPONIN, ED  I-STAT TROPONIN, ED    EKG EKG Interpretation  Date/Time:  Tuesday August 20 2018 11:15:05 EDT Ventricular Rate:  61 PR Interval:    QRS Duration: 74 QT Interval:  432 QTC Calculation: 436 R Axis:   20 Text Interpretation:  Sinus rhythm Low voltage, precordial leads Abnormal R-wave progression, early transition No significant change since last tracing Confirmed by Wandra Arthurs [phone]) on 08/20/2018 11:35:41 AM Also confirmed by Wandra Arthurs [phone]), editor Philomena Doheny [phone])  on 08/20/2018 1:39:12 PM   Radiology Dg Chest Port 1 View  Result Date: 08/20/2018 CLINICAL DATA:  Chest pain. EXAM: PORTABLE CHEST 1 VIEW  COMPARISON:  11/09/2017. FINDINGS: Mediastinum and hilar structures normal. Lungs are clear. No pleural effusion or pneumothorax. Heart size normal. Degenerative change thoracic spine. IMPRESSION: No acute cardiopulmonary disease. Electronically Signed   By: [name]  Register   On: 08/20/2018 11:35    Procedures Procedures (including critical care time)  Medications Ordered in ED Medications - No data to display   Initial Impression / Assessment and Plan / ED Course  I have reviewed the triage vital signs and the nursing notes.  Pertinent labs & imaging results that were available during my care of the patient were reviewed by me and considered in my medical decision making (see chart for details).        KALLYN DEMARCUS is a 73 y.o. female here with chest pressure, SOB.  Symptoms have been going on for the last month or so.  She may have underlying CAD but I am suspecting some stable angina.  She has no chest pain at rest and the symptoms did not seem to be progressive.  I doubt dissection or PE.  Will get labs, chest x-ray, trop x 2. Will have her follow up with cardiology outpatient stress test or cath if troponins were negative.   .2:35 PM Trop neg x 2. Labs and CXR unremarkable. Will have her follow up with cardiology for stress test outpatient.    Final Clinical Impressions(s) / ED Diagnoses  Final diagnoses:  None    ED Discharge Orders    None       Drenda Freeze, MD 08/20/18 1435

## 2018-08-20 NOTE — ED Notes (Signed)
ED Provider at bedside. 

## 2018-08-20 NOTE — Discharge Instructions (Signed)
Call cardiology for appointment for stress test.   Continue your current meds   Your heart enzymes are normal today   Return to ER if you have worse chest pain, shortness of breath, fever

## 2018-08-20 NOTE — ED Triage Notes (Signed)
Pt in with c/o central cp x 4 wks, described as tightness and "pounding". Pt states she called her doctor yesterday and they wanted her evaluated. Denies any n/v, has sob with exertion.

## 2018-08-21 NOTE — Telephone Encounter (Signed)
Pt did go to the ER and was evaluated.

## 2018-09-05 ENCOUNTER — Ambulatory Visit: Payer: Self-pay | Admitting: Family Medicine

## 2018-09-05 NOTE — Telephone Encounter (Signed)
Can I get a COVID test if I don't have any symptoms or exposure to anyone positive?  I let her know that she doesn't meet any of the above criteria.   I asked why she wanted to be tested.   I'm going to visit my grandchildren in [place] and I didn't know if I should be tested or not.  She has decided to stay home for 14 days and make sure she doesn't get any symptoms.   I let her know to call us back if she starts feeling bad or having symptoms or if she is exposed to someone positive since she is going to stay home for 14 days.   She was agreeable to this plan.   Reason for Disposition . Health Information question, no triage required and triager able to answer question  Answer Assessment - Initial Assessment Questions 1. REASON FOR CALL or QUESTION: "What is your reason for calling today?" or "How can I best help you?" or "What question do you have that I can help answer?"     See documentatioin  Protocols used: INFORMATION ONLY CALL-A-AH

## 2018-09-27 LAB — HM MAMMOGRAPHY

## 2018-10-10 ENCOUNTER — Telehealth: Payer: Self-pay | Admitting: Internal Medicine

## 2018-10-10 NOTE — Telephone Encounter (Signed)
LMTCB if pt would like to reschedule appt with Dr. Margaretann Loveless (currently scheduled on 8/6) to tomorrow, 7/24.

## 2018-10-16 ENCOUNTER — Telehealth: Payer: Self-pay | Admitting: *Deleted

## 2018-10-16 NOTE — Telephone Encounter (Signed)
LVMTCB so we can change her office visit into a virtual visit.

## 2018-10-23 ENCOUNTER — Telehealth: Payer: Self-pay

## 2018-10-23 NOTE — Telephone Encounter (Signed)
Contacted patient in regards to upcoming appointment being converted into virtual appointment if agreeded.  Spoke with the patient and she agreed that a virtual appointment was fine with her.

## 2018-10-24 ENCOUNTER — Other Ambulatory Visit: Payer: Self-pay

## 2018-10-24 ENCOUNTER — Telehealth (INDEPENDENT_AMBULATORY_CARE_PROVIDER_SITE_OTHER): Payer: Medicare Other | Admitting: Internal Medicine

## 2018-10-30 NOTE — Progress Notes (Signed)
Appt cancelled, please close encounter.

## 2018-11-02 ENCOUNTER — Other Ambulatory Visit: Payer: Self-pay | Admitting: Family Medicine

## 2018-11-10 NOTE — Progress Notes (Deleted)
Cardiology Office Note:    Date:  11/10/2018   ID:  Maria Schneider, DOB [birth year], MRN [id number]  PCP:  Laurey Morale, MD  Cardiologist:  No primary care provider on file.  Electrophysiologist:  None   Referring MD: Laurey Morale, MD   No chief complaint on file. ***  History of Present Illness:    Maria Schneider is a 73 y.o. female with a hx of hypertension, hyperlipidemia who presents for evaluation of chest pain palpitations.  Patient presented to the emergency ED on 08-20-18 with chest pain palpitations.  She reported that she been having intermittent chest pain and palpitations for the prior month.  In the ED she denied any resting chest pain.  Labs, including troponins x2, chest x-ray, and EKG were unremarkable.  It was suspected that she was having stable angina and she was referred to cardiology.  Past Medical History:  Diagnosis Date  . Allergy   . Arthritis   . Asthma   . Cancer ([place])    hx skin cancer  . Depression   . Detrusor instability   . Diverticulosis    not symptomatic  . GERD (gastroesophageal reflux disease)   . History of kidney stones   . History of pancreatitis    April 2015  . History of skin cancer   . Hyperlipidemia   . Hypertension   . Osteopenia   . Urinary incontinence   . Vitamin D deficiency    RESOLVED     Past Surgical History:  Procedure Laterality Date  . ABDOMINAL HYSTERECTOMY  1994   TAH/ BLADDER SUSPENSION  . BREAST BIOPSY  1972   benign  . CHOLECYSTECTOMY N/A 07/12/2013   Procedure: LAPAROSCOPIC CHOLECYSTECTOMY WITH INTRAOPERATIVE CHOLANGIOGRAM;  Surgeon: Edward Jolly, MD;  Location: [facility];  Service: General;  Laterality: N/A;  . COLONOSCOPY  06-24-12   per Dr. Earlean Shawl, benign polyps, repeat in 5 yrs    . ERCP N/A 07/14/2013   Procedure: ENDOSCOPIC RETROGRADE CHOLANGIOPANCREATOGRAPHY (ERCP);  Surgeon: Gatha Mayer, MD;  Location: [facility];  Service: Endoscopy;  Laterality: N/A;  . TONSILLECTOMY    . TOTAL HIP  ARTHROPLASTY Right 01/13/2015   Procedure: RIGHT TOTAL HIP ARTHROPLASTY ANTERIOR APPROACH;  Surgeon: Gaynelle Arabian, MD;  Location: [facility];  Service: Orthopedics;  Laterality: Right;  . TOTAL HIP ARTHROPLASTY Left 11/07/2017   Procedure: LEFT TOTAL HIP ARTHROPLASTY ANTERIOR APPROACH;  Surgeon: Gaynelle Arabian, MD;  Location: [facility];  Service: Orthopedics;  Laterality: Left;  . TUBAL LIGATION      Current Medications: No outpatient medications have been marked as taking for the 11/11/18 encounter (Appointment) with Donato Heinz, MD.     Allergies:   Ace inhibitors, Codeine, Fentanyl, and Statins   Social History   Socioeconomic History  . Marital status: Married    Spouse name: Not on file  . Number of children: Not on file  . Years of education: Not on file  . Highest education level: Not on file  Occupational History  . Not on file  Social Needs  . Financial resource strain: Not on file  . Food insecurity    Worry: Not on file    Inability: Not on file  . Transportation needs    Medical: Not on file    Non-medical: Not on file  Tobacco Use  . Smoking status: Former Smoker    Years: 42.00    Types: Cigarettes    Quit date: 07/09/2008    Years  since quitting: 10.3  . Smokeless tobacco: Never Used  . Tobacco comment: 9 years ago  Substance and Sexual Activity  . Alcohol use: Yes    Alcohol/week: 2.0 standard drinks    Types: 2 Glasses of wine per week    Comment: OCC  . Drug use: No  . Sexual activity: Never    Birth control/protection: Surgical  Lifestyle  . Physical activity    Days per week: Not on file    Minutes per session: Not on file  . Stress: Not on file  Relationships  . Social Herbalist on phone: Not on file    Gets together: Not on file    Attends religious service: Not on file    Active member of club or organization: Not on file    Attends meetings of clubs or organizations: Not on file    Relationship status: Not on file   Other Topics Concern  . Not on file  Social History Narrative   Gets regular exercise        Family History: The patient's ***family history includes Breast cancer in her paternal aunt; Diabetes in her paternal uncle; Heart disease in her father; Hypertension in her mother; Osteoporosis in her mother and sister.  ROS:   Please see the history of present illness.    *** All other systems reviewed and are negative.  EKGs/Labs/Other Studies Reviewed:    The following studies were reviewed today: ***  EKG:  EKG is *** ordered today.  The ekg ordered today demonstrates ***  Recent Labs: 08/20/2018: ALT 17; BUN 20; Creatinine, Ser 1.14; Hemoglobin 12.3; Platelets 176; Potassium 4.5; Sodium 140  Recent Lipid Panel    Component Value Date/Time   CHOL 178 02/18/2016 0928   TRIG 141.0 02/18/2016 0928   HDL 61.80 02/18/2016 0928   CHOLHDL 3 02/18/2016 0928   VLDL 28.2 02/18/2016 0928   LDLCALC 88 02/18/2016 0928   LDLDIRECT 147.7 12/29/2011 0957    Physical Exam:    VS:  There were no vitals taken for this visit.    Wt Readings from Last 3 Encounters:  08/20/18 165 lb (74.8 kg)  11/26/17 165 lb 9.6 oz (75.1 kg)  11/09/17 164 lb (74.4 kg)     GEN: *** Well nourished, well developed in no acute distress HEENT: Normal NECK: No JVD; No carotid bruits LYMPHATICS: No lymphadenopathy CARDIAC: ***RRR, no murmurs, rubs, gallops RESPIRATORY:  Clear to auscultation without rales, wheezing or rhonchi  ABDOMEN: Soft, non-tender, non-distended MUSCULOSKELETAL:  No edema; No deformity  SKIN: Warm and dry NEUROLOGIC:  Alert and oriented x 3 PSYCHIATRIC:  Normal affect   ASSESSMENT:    No diagnosis found. PLAN:    In order of problems listed above:  Chest pain:  HTN: Currently on Toprol-XL 25 mg daily and valsartan 80 mg daily  Hyperlipidemia: Reports inability to tolerate statins due to myalgias.  Currently on Zetia 10 mg daily and fenofibrate 160 mg daily.  Last LDL in our  system was 88 on 02/18/16   Medication Adjustments/Labs and Tests Ordered: Current medicines are reviewed at length with the patient today.  Concerns regarding medicines are outlined above.  No orders of the defined types were placed in this encounter.  No orders of the defined types were placed in this encounter.   There are no Patient Instructions on file for this visit.   Signed, Donato Heinz, MD  11/10/2018 2:06 PM    [facility]

## 2018-11-11 ENCOUNTER — Ambulatory Visit: Payer: Medicare Other | Admitting: Cardiology

## 2018-11-19 NOTE — Nursing Note (Signed)
 Adult Admission Assessment - Text       Perioperative Admission Assessment Entered On:  11/19/2018 13:07 EDT    Performed On:  11/19/2018 12:51 EDT by Leandro, RN, Comer ORN               General   Call Complete :   11/19/2018 13:25 EDT   Height/Length Estimated :   160.0 cm(Converted to: 5 ft 3 in, 5.25 ft, 62.99 in)    BMI   Estimated :   75.0 kg(Converted to: 165 lb 6 oz, 165.347 lb)    Body Mass Index Estimated :   29.3 kg/m2   Primary Care Physician/Specialists :   DR[facility] ERIC [facility]   Emergency Contact Name :   DAJAE KIZER   Emergency Contact Phone :   [phone]   Pogorzelski, RNComer ORN - 11/19/2018 13:16 EDT   Call Start :   11/19/2018 12:52 EDT   Information Given By :   Self   Languages :   Isadora   Pogorzelski, RN, Comer ORN - 11/19/2018 12:51 EDT   Allergies   (As Of: 11/26/2018 10:57:32 EDT)   Allergies (Active)   Levaquin  Estimated Onset Date:   Unspecified ; Reactions:   Palpitations ; Created By:   Leandro, RN, Comer ORN; Reaction Status:   Active ; Category:   Drug ; Substance:   Levaquin ; Type:   Allergy ; Severity:   Moderate ; Updated By:   Leandro, RN, Comer ORN; Reviewed Date:   11/26/2018 10:53 EDT      NSAIDs  Estimated Onset Date:   Unspecified ; Reactions:   Nausea ; Created By:   Leandro, RN, Comer ORN; Reaction Status:   Active ; Category:   Drug ; Substance:   NSAIDs ; Type:   Allergy ; Severity:   Moderate ; Updated By:   Leandro, RN, Comer ORN; Reviewed Date:   11/26/2018 10:53 EDT        Medication History   Medication List   (As Of: 11/26/2018 10:57:32 EDT)   Normal Order    Lactated Ringers Injection solution 1000 mL  :   Lactated Ringers Injection solution 1000 mL ; Status:   Ordered ; Ordered As Mnemonic:   Lactated Ringers Injection 1000 mL ; Simple Display Line:   40 mL/hr, IV ; Ordering Provider:   GRAHAM-MD,  JOHN M; Catalog Code:   Lactated Ringers Injection ; Order Dt/Tm:   11/26/2018 07:44:38 EDT ; Comment:    Perioperative use ONLY  For Non Dialysis Patient          Sodium Chloride 0.9% intravenous solution 500 mL  :   Sodium Chloride 0.9% intravenous solution 500 mL ; Status:   Ordered ; Ordered As Mnemonic:   Sodium Chloride 0.9% 500 mL ; Simple Display Line:   10 mL/hr, IV ; Ordering Provider:   GRAHAM-MD,  JOHN M; Catalog Code:   Sodium Chloride 0.9% ; Order Dt/Tm:   11/26/2018 07:44:38 EDT ; Comment:   Perioperative use ONLY  For Dialysis Patient          ceFAZolin duplex  :   ceFAZolin duplex ; Status:   Ordered ; Ordered As Mnemonic:   ceFAZolin ; Simple Display Line:   2 g, 50 mL, 100 mL/hr, IV Piggyback, On Call ; Ordering Provider:   GRIFFITH NORLEEN HERO; Catalog Code:   ceFAZolin ; Order Dt/Tm:   11/26/2018 07:44:38 EDT ; Comment:   Wt< 120 kg/264lb  Home Meds    ALPRAZolam   :   ALPRAZolam  ; Status:   Documented ; Ordered As Mnemonic:   Xanax  XR 0.5 mg oral tablet, extended release ; Simple Display Line:   0.5 mg, 1 tabs, Oral, qPM, 0 Refill(s) ; Catalog Code:   ALPRAZolam  ; Order Dt/Tm:   11/19/2018 13:18:30 EDT          albuterol  :   albuterol ; Status:   Documented ; Ordered As Mnemonic:   ProAir HFA 90 mcg/inh inhalation aerosol ; Simple Display Line:   2 puffs, Inhale, q4hr, PRN: as needed for wheezing, 8.5 g, 0 Refill(s) ; Catalog Code:   albuterol ; Order Dt/Tm:   11/19/2018 13:17:49 EDT          cholecalciferol  :   cholecalciferol ; Status:   Documented ; Ordered As Mnemonic:   Vitamin D3 5000 intl units oral tablet ; Simple Display Line:   5,000 units, tabs, Oral, Daily, 100 tabs, 0 Refill(s) ; Catalog Code:   cholecalciferol ; Order Dt/Tm:   11/19/2018 13:18:10 EDT          calcium carbonate  :   calcium carbonate ; Status:   Documented ; Ordered As Mnemonic:   Caltrate 600 mg oral tablet ; Simple Display Line:   600 mg, 1 tabs, Oral, BID, 60 tabs, 0 Refill(s) ; Catalog Code:   calcium carbonate ; Order Dt/Tm:   11/19/2018 13:18:02 EDT ; Comment:    NOTE 1250MG  OF CALCIUM   CARBONATE IS EQUIVALENT TO  500MG  OF   ELEMENTAL CALCIUM            Problem History   (As Of: 11/26/2018 10:57:32 EDT)   Problems(Active)    Acid reflux (SNOMED CT  :646864985 )  Name of Problem:   Acid reflux ; Recorder:   Pogorzelski, RN, Comer ORN; Confirmation:   Confirmed ; Classification:   Patient Stated ; Code:   646864985 ; Contributor System:   PowerChart ; Last Updated:   11/19/2018 12:59 EDT ; Life Cycle Date:   11/19/2018 ; Life Cycle Status:   Active ; Vocabulary:   SNOMED CT        Asthma (SNOMED CT  :698514988 )  Name of Problem:   Asthma ; Recorder:   Pogorzelski, RN, Comer ORN; Confirmation:   Confirmed ; Classification:   Patient Stated ; Code:   698514988 ; Contributor System:   PowerChart ; Last Updated:   11/19/2018 12:58 EDT ; Life Cycle Date:   11/19/2018 ; Life Cycle Status:   Active ; Vocabulary:   SNOMED CT        Fibromyalgia (SNOMED CT  :688337989 )  Name of Problem:   Fibromyalgia ; Recorder:   Pogorzelski, RN, Comer ORN; Confirmation:   Confirmed ; Classification:   Patient Stated ; Code:   688337989 ; Contributor System:   PowerChart ; Last Updated:   11/19/2018 13:01 EDT ; Life Cycle Date:   11/19/2018 ; Life Cycle Status:   Active ; Vocabulary:   SNOMED CT        H/O: lung cancer (SNOMED CT  :7305149982 )  Name of Problem:   H/O: lung cancer ; Onset Date:   44 ; Recorder:   Pogorzelski, RN, Comer ORN; Confirmation:   Confirmed ; Classification:   Patient Stated ; Code:   7305149982 ; Contributor System:   PowerChart ; Last Updated:   11/19/2018 12:59 EDT ; Life Cycle Status:   Active ; Vocabulary:  SNOMED CT   ; Comments:        11/19/2018 12:59 - Pogorzelski, RN, Comer ORN  LEFT      History of kidney stones (SNOMED CT  :7307549985 )  Name of Problem:   History of kidney stones ; Recorder:   Pogorzelski, RN, Comer ORN; Confirmation:   Confirmed ; Classification:   Patient Stated ; Code:   7307549985 ; Contributor System:   PowerChart ; Last Updated:   11/19/2018 13:00 EDT ; Life Cycle Date:   11/19/2018 ; Life  Cycle Status:   Active ; Vocabulary:   SNOMED CT        Low back pain (SNOMED CT  :583860989 )  Name of Problem:   Low back pain ; Recorder:   Pogorzelski, RN, Comer ORN; Confirmation:   Confirmed ; Classification:   Patient Stated ; Code:   583860989 ; Contributor System:   PowerChart ; Last Updated:   11/19/2018 13:00 EDT ; Life Cycle Date:   11/19/2018 ; Life Cycle Status:   Active ; Vocabulary:   SNOMED CT        Osteoarthritis (SNOMED CT  :8223751988 )  Name of Problem:   Osteoarthritis ; Recorder:   Pogorzelski, RN, Comer ORN; Confirmation:   Confirmed ; Classification:   Patient Stated ; Code:   8223751988 ; Contributor System:   PowerChart ; Last Updated:   11/19/2018 13:00 EDT ; Life Cycle Date:   11/19/2018 ; Life Cycle Status:   Active ; Vocabulary:   SNOMED CT        Right knee pain (SNOMED CT  :48124985 )  Name of Problem:   Right knee pain ; Recorder:   Pogorzelski, RN, Comer ORN; Confirmation:   Confirmed ; Classification:   Patient Stated ; Code:   48124985 ; Contributor System:   PowerChart ; Last Updated:   11/19/2018 12:58 EDT ; Life Cycle Date:   11/19/2018 ; Life Cycle Status:   Active ; Vocabulary:   SNOMED CT        Tear of medial meniscus of right knee (SNOMED CT  :555033984 )  Name of Problem:   Tear of medial meniscus of right knee ; Recorder:   Pogorzelski, RN, Comer ORN; Confirmation:   Confirmed ; Classification:   Patient Stated ; Code:   555033984 ; Contributor System:   PowerChart ; Last Updated:   11/19/2018 13:19 EDT ; Life Cycle Date:   11/19/2018 ; Life Cycle Status:   Active ; Vocabulary:   SNOMED CT          Diagnoses(Active)    Chondromalacia of right knee  Date:   11/25/2018 ; Diagnosis Type:   Pre-Op Diagnosis ; Confirmation:   Confirmed ; Clinical Dx:   Chondromalacia of right knee ; Classification:   Medical ; Clinical Service:   Surgery ; Code:   ICD-10-CM ; Probability:   0 ; Ranking:   Primary ; Diagnosis Code:   M94.261      Complex tear of medial meniscus, current injury, right  knee, subsequent encounter  Date:   11/25/2018 ; Diagnosis Type:   Pre-Op Diagnosis ; Confirmation:   Confirmed ; Clinical Dx:   Complex tear of medial meniscus, current injury, right knee, subsequent encounter ; Classification:   Medical ; Clinical Service:   Surgery ; Code:   ICD-10-CM ; Probability:   0 ; Ranking:   Primary ; Diagnosis Code:   D16.768I      Encounter for screening for other viral  diseases  Date:   11/19/2018 ; Confirmation:   Confirmed ; Clinical Dx:   Encounter for screening for other viral diseases ; Classification:   Medical ; Clinical Service:   Non-Specified ; Code:   ICD-10-CM ; Probability:   0 ; Diagnosis Code:   Z11.59        Procedure History        -    Procedure History   (As Of: 11/26/2018 10:57:32 EDT)     Anesthesia Minutes:   0 ; Procedure Name:   ORIF - Open reduction and internal fixation of fracture ; Procedure Minutes:   0 ; Comments:     11/19/2018 13:20 EDT - Pogorzelski, RN, Comer ORN  BILATERAL WRISTS ; Last Reviewed Dt/Tm:   11/26/2018 10:55:49 EDT            Anesthesia Minutes:   0 ; Procedure Name:   Breast reduction, bilateral ; Procedure Minutes:   0 ; Last Reviewed Dt/Tm:   11/26/2018 10:55:49 EDT            Procedure Dt/Tm:   2001 ; Anesthesia Minutes:   0 ; Procedure Name:   Fusion of lumbar spine ; Procedure Minutes:   0 ; Comments:     11/19/2018 13:20 EDT - Pogorzelski, RN, Comer ORN  HARDWARE REMOVED 2003 ; Last Reviewed Dt/Tm:   11/26/2018 10:55:49 EDT            Procedure Dt/Tm:   8006 ; Anesthesia Minutes:   0 ; Procedure Name:   Thoracotomy ; Procedure Minutes:   0 ; Comments:     11/19/2018 13:21 EDT - Pogorzelski, RN, Comer ORN  LEFT LOWER LOBE RESECTION ; Last Reviewed Dt/Tm:   11/26/2018 10:55:49 EDT            Procedure Dt/Tm:   2014 ; Anesthesia Minutes:   0 ; Procedure Name:   Arthroplasty of right shoulder ; Procedure Minutes:   0 ; Last Reviewed Dt/Tm:   11/26/2018 10:55:49 EDT            Procedure Dt/Tm:   8003 ; Anesthesia Minutes:   0 ; Procedure Name:    Arthroscopy of shoulder ; Procedure Minutes:   0 ; Comments:     11/19/2018 13:23 EDT - Pogorzelski, RN, Comer ORN  RIGHT ; Last Reviewed Dt/Tm:   11/26/2018 10:55:49 EDT            Anesthesia Minutes:   0 ; Procedure Name:   Colonoscopy ; Procedure Minutes:   0 ; Last Reviewed Dt/Tm:   11/26/2018 10:55:49 EDT            Procedure Dt/Tm:   2011 ; Anesthesia Minutes:   0 ; Procedure Name:   Arthroscopy of shoulder ; Procedure Minutes:   0 ; Comments:     11/19/2018 13:21 EDT - Pogorzelski, RN, Comer ORN  RIGHT ; Last Reviewed Dt/Tm:   11/26/2018 10:55:49 EDT            Anesthesia Minutes:   0 ; Procedure Name:   Cholecystectomy ; Procedure Minutes:   0 ; Last Reviewed Dt/Tm:   11/26/2018 10:55:49 EDT            History Confirmation   Problem History Changes PAT :   No   Procedure History Changes PAT :   No   BRYANT, RN, SUSAN - 11/26/2018 10:51 EDT   Anesthesia/Sedation   Anesthesia History :   Prior general anesthesia   SN -  Malignant Hyperthermia :   Denies   Previous Problem with Anesthesia :   None   Moderate Sedation History :   Prior sedation for procedure   Previous Problem With Sedation :   None   Symptoms of Sleep Apnea :   Age greater than 50   Symptoms of Sleep Apnea Score :   1    Shortness of Breath Indicator :   No shortness of breath   Pogorzelski, RN, Comer ORN - 11/19/2018 12:51 EDT   Bloodless Medicine   Is Blood Transfusion Acceptable to Patient :   Yes   Pogorzelski, RN, Comer ORN - 11/19/2018 12:51 EDT   ID Risk Screen Symptoms   Recent Travel History :   No recent travel   Close Contact with COVID-19 ID :   Preadmission testing patients only   Last 14 days COVID-19 ID :   No   TB Symptom Screen :   No symptoms   C. diff Symptom/History ID :   Neither of the above   Pogorzelski, RN, Comer ORN - 11/19/2018 12:51 EDT   ID COVID-19 Screen   Fever OR Chills :   No   Headache :   No   New or Worsening Cough :   No   Fatigue :   No   Shortness of Breath ID :   No   Myalgia (Muscle Pain) :   No   Dyspnea :   No    Diarrhea :   No   Sore Throat :   No   Nausea :   No   Laryngitis :   No   Sudden Loss of Taste or Smell :   No   Pogorzelski, RN, Comer ORN - 11/19/2018 12:51 EDT   Social History   Social History   (As Of: 11/26/2018 10:57:32 EDT)   Tobacco:        Tobacco use: Former smoker, quit more than 30 days ago.   (Last Updated: 11/19/2018 13:04:03 EDT by Leandro, RN, Comer ORN)          Electronic Cigarette/Vaping:        Never Electronic Cigarette Use.   (Last Updated: 11/19/2018 13:04:08 EDT by Leandro, RN, Comer ORN)          Alcohol:        Current, Wine, Several times per day   (Last Updated: 11/19/2018 13:04:30 EDT by Leandro, RN, Comer ORN)          Substance Use:        Opioid Naive, Denies   (Last Updated: 11/19/2018 13:04:37 EDT by Leandro, RN, Comer ORN)            Advance Directive   Advance Directive :   Yes   Pogorzelski, RN, Comer ORN - 11/19/2018 12:51 EDT   PAT Patient Instructions   Patient Arrival Time PAT :   11/26/2018 0:00 EDT   (Comment: PER OFFICE [Pogorzelski, RN, Comer ORN - 11/19/2018 13:16 EDT] )   Medications in AM :   ALBUTEROL INHALER   Medication Understanding :   Verbalizes understanding   NPO PAT :   NPO after midnight   Pogorzelski, RN, Comer ORN - 11/19/2018 13:16 EDT   PAT Instructions Grid   Make up Understanding :   [place] understanding   Jewelry Understanding :   Verbalizes understanding   Perfume Understanding :   Verbalizes understanding   Valuables Understanding :   Verbalizes understanding   Smoking Understanding :  Verbalizes understanding   Clothing Understanding :   Financial trader MD for Illness :   Financial trader MD for skin injury :   Verbalizes understanding   Pogorzelski, RN, Comer ORN - 11/19/2018 13:16 EDT   Service Line PAT :   Ortho   Laterality PAT :   Right   Prep PAT :   Hibiclens, Inhalers   Name of Contact PAT :   MICHAEL Vessel   Relationship of Contact PAT :   HUSBAND   Contact Number PAT :    [phone]   Transportation Instructions PAT :   Accompany to Hospital, Transport Home, Remain with 24 hours post-procedure   Pogorzelski, RN, Comer ORN - 11/19/2018 13:16 EDT   Additional Contacts PAT :   COVID TESTING 11/22/2018  COVID RESCREEN NEG, LAB TEST NEG 9.7.20   DAVIS, RN, TRACY E - 11/25/2018 13:01 EDT     Harm Screen   Injuries/Abuse/Neglect in Household :   Denies   Feels Unsafe at Home :   No   Last 3 mo, thoughts killing self/others :   Patient denies   ARMIDA, RN, SUSAN - 11/26/2018 10:51 EDT

## 2018-11-24 LAB — COVID-19: SARS-CoV-2: NOT DETECTED

## 2018-11-26 NOTE — Anesthesia Pre-Procedure Evaluation (Signed)
Preanesthesia Evaluation        Patient:   Lorraine Freeman, Lorraine Freeman            MRN: [id number]            FIN: [id number]               Age:   73 years     Sex:  Female     DOB:  1945-07-22   Associated Diagnoses:   None   Author:   Renata Caprice A      Preoperative Information   NPO:  NPO greater than 8 hours.    Anesthesia history     Patient's history: negative.     Family's history: negative.        Health Status   Allergies:    Allergic Reactions (Selected)  Moderate  Levaquin- Palpitations.  NSAIDs- Nausea.,    Allergies    (Active and Proposed Allergies Only)  Levaquin   (Severity: Moderate, Onset: Unknown)   Reactions: Palpitations, Unknown  NSAIDs   (Severity: Moderate, Onset: Unknown)   Reactions: Nausea, Unknown     Current medications:    Home Medications (4) Active  Caltrate 600 mg oral tablet 600 mg = 1 tabs, Oral, BID  ProAir HFA 90 mcg/inh inhalation aerosol 2 puffs, PRN, Inhale, q4hr  Vitamin D3 5000 intl units oral tablet 5,000 units, Oral, Daily  Xanax XR 0.5 mg oral tablet, extended release 0.5 mg = 1 tabs, Oral, qPM  ,    Medications (3) Active  Scheduled: (1)  ceFAZolin duplex  2 g 50 mL, IV Piggyback, On Call  Continuous: (2)  Lactated Ringers Injection solution 1000 mL  1,000 mL, IV, 40 mL/hr  Sodium Chloride 0.9% intravenous solution 500 mL  500 mL, IV, 10 mL/hr  PRN: (0)     Problem list:    Active Problems (9)  Acid reflux   Asthma   Fibromyalgia   H/O: lung cancer   History of kidney stones   Low back pain   Osteoarthritis   Right knee pain   Tear of medial meniscus of right knee   ,    Problems   (Active Problems Only)    History of malignant neoplasm of thoracic cavity structure   (SNOMED CT: 0932355732, Onset: 10/27/91)   Comment:  LEFT  (Pogorzelski, RN, Norton Pastel on 11/19/18)   Asthma   (SNOMED CT: 202542706, Onset: --)  Knee pain   (SNOMED CT: 23762831, Onset: --)  Gastroesophageal reflux disease   (SNOMED CT: 517616073, Onset: --)  History of calculus of kidney   (SNOMED CT:  7106269485, Onset: --)  Osteoarthritis   (SNOMED CT: 4627035009, Onset: --)  Low back pain   (SNOMED CT: 381829937, Onset: --)  Fibromyalgia   (SNOMED CT: 169678938, Onset: --)  Tear of medial meniscus of knee   (SNOMED CT: 101751025, Onset: --)        Histories   Past Medical History:    No active or resolved past medical history items have been selected or recorded.   Procedure history:    Arthroplasty of right shoulder (8527782423) in 2014 at 28 Years.  Arthroscopy of shoulder (536144315) in 2011 at 75 Years.  Comments:  11/19/2018 13:21 EDT - Pogorzelski, RN, Norton Pastel  RIGHT  Fusion of lumbar spine (400867619) in 2001 at 83 Years.  Comments:  11/19/2018 13:20 EDT - Pogorzelski, RN, Norton Pastel  HARDWARE REMOVED 2003  Arthroscopy of shoulder (509326712) in 1996 at 42 Years.  Comments:  11/19/2018 13:23 EDT - Pogorzelski, RN, Norton Pastel  RIGHT  Thoracotomy (027253664) in 1993 at 48 Years.  Comments:  11/19/2018 13:21 EDT - Pogorzelski, RN, Norton Pastel  LEFT LOWER LOBE RESECTION  Colonoscopy (403474259).  ORIF - Open reduction and internal fixation of fracture (563875643).  Comments:  11/19/2018 13:20 EDT - Pogorzelski, RN, Norton Pastel  BILATERAL WRISTS  Cholecystectomy (32951884).  Breast reduction, bilateral (166063016).   Social History        Social & Psychosocial Habits    Alcohol  11/19/2018  Use: Current    Type: Wine    Frequency: Several times per day    Substance Use  11/19/2018  Opioid Assessment Opioid Naive    Use: Denies    Tobacco  11/19/2018  Use: Former smoker, quit more    Electronic Cigarette/Vaping  11/19/2018  Electronic Cigarette Use: Never  .        Physical Examination   Vital Signs   0/03/930 35:57 EDT Systolic Blood Pressure 322 mmHg    Diastolic Blood Pressure 91 mmHg  HI    Temperature Oral 36.4 degC    Heart Rate Monitored 78 bpm    Respiratory Rate 16 br/min    SpO2 96 %      Measurements from flowsheet : Measurements   11/26/2018 11:29 EDT Body Mass Index est meas 29.61 kg/m2   11/26/2018 11:29  EDT Body Mass Index Measured 29.61 kg/m2   11/26/2018 10:51 EDT Height/Length Measured 160 cm    Weight Measured 75.8 kg    Weight Dosing 75.8 kg    Patient Stated Weight 75.8 kg      Airway:          Mallampati classification: II (soft palate, fauces, uvula visible).         Thyromental Distance: Normal.    Respiratory:  Lungs are clear to auscultation.    Cardiovascular:  Normal rate, No murmur, Regular rhythm.       Assessment and Plan   American Society of Anesthesiologists#(ASA) physical status classification:  Class II.    Anesthetic Preoperative Plan     Anesthetic technique: General anesthesia.     Opioid Assessment: Opioid Na????ve.     Risks discussed: nausea, headache, sore throat, dental injury, serious complications.     Signature Line     Electronically Signed on 11/26/2018 11:37 AM EDT   ________________________________________________   Renata Caprice A

## 2018-11-26 NOTE — Procedures (Signed)
IntraOp Record - [facility]             IntraOp Record - [facility] Summary                                                                   Primary Physician:        Ruby Cola    Case Number:              [id number]    Finalized Date/Time:      11/26/18 12:31:59    Pt. Name:                 Lorraine Freeman, Lorraine Freeman    D.O.B./Sex:               [birth year]    Female    Med Rec #:                [id number]    Physician:                Ruby Cola    Financial #:              [id number]    Pt. Type:                 S    Room/Bed:                 /    Admit/Disch:              11/26/18 09:03:00 -    Institution:       [facility] - Case Times                                                                                                         Entry 1                                                                                                          Patient      In Room Time             11/26/18 11:50:00               Out Room Time                   11/26/18 12:28:00    Anesthesia     Procedure  Start Time               11/26/18 12:06:00               Stop Time                       11/26/18 12:25:00    Last Modified By:         Hayden Rasmussen, Stanton Kidney K                              11/26/18 12:31:25      [facility] - Case Times Audit                                                                          11/26/18 12:31:25         Owner: PAFFMA                               Modifier: PAFFMA                                                        <+> 1         Out Room Time        <+> 1         Stop Time        [facility] - Case Attendance                                                                                                    Entry 1                         Entry 2                         Entry 3                                          Case Attendee             Bastrop,  CHRISTOPHER         GRAHAM-MD,  Dillon Bjork, RN, MARY K  A    Role Performed            Anesthesiologist                 Surgeon Primary                 Circulator    Time In                   11/26/18 11:50:00               11/26/18 11:50:00               11/26/18 11:50:00    Time Out                  11/26/18 12:28:00               11/26/18 12:28:00               11/26/18 12:28:00    Procedure                 Knee Arthroscopy                Knee Arthroscopy                Knee Arthroscopy                              Operative(Right)                Operative(Right)                Operative(Right)    Last Modified By:         Ria Comment RN, Darnelle Going, RN, Darnelle Going, RN, [facility] K                              11/26/18 12:31:36               11/26/18 12:31:36               11/26/18 12:31:36                                Entry 4                                                                                                          Case Attendee             Fuller [place] A    Role Performed            Surgical Scrub    Time In                   11/26/18 11:50:00    Time  Out                  11/26/18 12:28:00    Procedure                 Knee Arthroscopy                              Operative(Right)    Last Modified By:         Marlon Pel K                              11/26/18 12:31:36      [facility] - Case Attendance Audit                                                                     11/26/18 12:31:36         Owner: PAFFMA                               Modifier: PAFFMA                                                            1     <+> Time Out            1     <*> Procedure                              Knee Arthroscopy Operative(Right)            2     <+> Time Out            2     <*> Procedure                              Knee Arthroscopy Operative(Right)            3     <+> Time Out            3     <*> Procedure                              Knee Arthroscopy Operative(Right)            4     <+> Time Out            4     <*> Procedure                              Knee Arthroscopy  Operative(Right)     11/26/18 12:14:35         Owner: PAFFMA  Modifier: PAFFMA                                                            1     <*> Procedure                              Knee Arthroscopy Operative(Right)            2     <*> Procedure                              Knee Arthroscopy Operative(Right)            3     <+> Time In            3     <*> Procedure                              Knee Arthroscopy Operative(Right)            4     <+> Time In            4     <*> Procedure                              Knee Arthroscopy Operative(Right)     11/26/18 12:07:48         Owner: PAFFMA                               Modifier: PAFFMA                                                            1     <+> Time In            1     <*> Procedure                              Knee Arthroscopy Operative(Right)            2     <+> Time In            2     <*> Procedure                              Knee Arthroscopy Operative(Right)        <+> 3         Case Attendee        <+> 3         Role Performed        <+> 3         Procedure        <+> 4         Case Attendee        <+>  4         Role Performed        <+> 4         Procedure        [facility] - Skin Assessment                                                                                                    Entry 1                                                                                                          Skin Integrity            Intact    Last Modified By:         Ria Comment RN, Stanton Kidney K                              11/26/18 12:12:09      [facility] - Patient Positioning                                                                                                Entry 1                                                                                                          Procedure                 Knee Arthroscopy                Body Position                   Lithotomy  Operative(Right)    Left Arm  Position         Extended on Padded Arm          Right Arm Position              Extended on Padded Arm                              Board w/Security Strap                                          Board w/Security Strap    Left Leg Position         Stirrup Leg Support             Right Leg Position              Knee Distraction Device                              Secured In                                                      Used    Feet Uncrossed            Yes                             Pressure Points                 Yes                                                              Checked     Additional                Johnson leg Psychologist, sport and exercise, Safety Strap    Information     Positioned By             Darron Doom,  CHRISTOPHER         Outcome Met (O.80)              Yes                              A, GRAHAM-MD,  Dillon Bjork, RN, [employer] K    Last Modified By:         Ria Comment, RN, Garvin Fila  11/26/18 12:11:16      [facility] - Skin Prep                                                                                                          Entry 1                                                                                                          Hair Removal     Skin Prep      Prep Agents (Im.270)     Chlorhexidine Gluconate         Prep Area (Im.270)              Leg                              2% w/Alcohol     Prep Area Details        Right                           Prep By                         Ria Comment RN, MARY K    Outcome Met (O.100)       Yes    Last Modified By:         Ria Comment RN, Stanton Kidney K                              11/26/18 12:12:37      [facility] - Counts Initial and Final                                                                                           Entry 1  Initial Counts      Initial Counts            GOSSETT, RN, MARY K,            Items included in               Sponges, Sharps     Performed By             [employer],  RONALD A                the Initial Count     Final Counts      Final Counts             GOSSETT, RN, MARY K,            Final Count Status              Correct     Performed By             Rubbie Battiest,  RONALD A     Items Included in        Sponges, Sharps     Final Count     Outcome Met (O.20)        Yes    Last Modified By:         Ria Comment RN, Stanton Kidney K                              11/26/18 12:08:42      [facility] - General Case Data                                                                                                  Entry 1                                                                                                          Case Information      ASA Class                2                               Case Level                      Level 3     OR                       JI 01  Specialty                       Orthopedic (SN)     Wound Class              1-Clean    Preop Diagnosis           S83.231D, M94.261               Postop Diagnosis                SAME AS ABOVE    Last Modified By:         Ria Comment RN, Stanton Kidney K                              11/26/18 12:08:00      [facility] - Fire Risk Assessment                                                                                               Entry 1                                                                                                          Fire Risk                 Alcohol Based Prep              Fire Risk Score                 2    Assessment: If            Solution, Open Oxygen    checked, checkmark        Source    = 1 point     Last Modified By:         Ria Comment RN, Stanton Kidney K                              11/26/18 12:09:14      [facility] - Time Out  Entry 1                                                                                                           Procedure                 Knee Arthroscopy                Is everyone ready               Yes                              Operative(Right)                to perform time out     Have all members of       Yes                             Patient name and                Yes    the surgical team                                         DOB confirmed     been introduced     Allergies discussed       Yes                             Surgical procedure              Yes                                                              to be performed                                                               confirmed and                                                               verified by  completed surgical                                                               consent     Correct surgical          Yes                             Correct laterality              Yes    site marked and                                           confirmed     initials are     visible through     prepped and draped     field (or     alternative ID band     used)     Correct patient           Yes                             Surgeon shares                  Yes    position confirmed                                        operative plan,                                                               possible                                                               difficulties,                                                               expected duration,                                                               anticipated blood  loss and reviews                                                               all                                                               critical/specific                                                                concerns     Required blood            Yes                             Essential imaging               Yes    products, implants,                                       available and fetal     devices and/or                                            heartones confirmed     special equipment                                         (if applicable)     available and     sterility confirmed     VTE prophylaxis           Yes                             Antibiotics ordered             Yes    addressed                                                 and administered     Anesthesia shares         Yes                             Fire risk                       Yes    anesthetic plan  and                                       assessment scored     reviews patient                                           and plan discussed     specific concerns     Appropriate drying        Yes                             Surgeon states:                 Yes    time for prep                                             Does anyone have     observed before                                           any concerns? If     draping                                                   you see, suspect,                                                               or feel that                                                               patient care is                                                               being compromised,                                                               speak up for  patient safety     Time Out Complete         11/26/18 12:05:00    Last Modified By:         Ria Comment RN, Stanton Kidney K                              11/26/18 12:11:44      [facility] - Debrief                                                                                                            Entry 1                                                                                                           Procedure                 Knee Arthroscopy                Final counts                    Yes                              Operative(Right)                correct and                                                               verbally verified                                                               with                                                               surgeon/licensed  independent                                                               practitioner (if                                                               applicable)     Actual procedure          Yes                             Postop diagnosis                Yes    performed confirmed                                       confirmed     Wound                     Yes                             Confirm specimens               Yes    classification                                            and specimens     confirmed                                                 labeled                                                               appropriately (if                                                               applicable)     Foley catheter            Yes                             Patient recovery                Yes  removed (if                                               plan confirmed     applicable)     Debrief Complete          11/26/18 12:22:00    Last Modified By:         Hayden Rasmussen, Stanton Kidney K                              11/26/18 12:22:25      [facility] - Debrief Audit                                                                             11/26/18 12:22:25         Owner: PAFFMA                               Modifier: PAFFMA                                                            1     <*> Procedure                              Knee Arthroscopy Operative(Right)            1     <+> Debrief Complete        [facility] - Patient Care Devices                                                                                                Entry 1                         Entry 2                                                                          Equipment Type            MACHINE SEQUENTIAL  Elberta Spaniel                              COMPRESSION    SCD Sleeve Site           Leg Left    Equipment/Tag Number      [id number]                       [id number]    Initiated Pre             Yes    Induction     Last Modified By:         Ria Comment RN, Darnelle Going, RN, [facility] K                              11/26/18 12:10:58               11/26/18 12:10:58      [facility] - Tourniquet                                                                                                         Entry 1                                                                                                          Tourniquet Type           TOURNIQUET PNEUMATIC            Serial Number                   [id number]    Setting                   250 mmHg                        Placement                       Leg Upper Right    Padding (Im.120)          Yes    Tourniquet Times      Inflated                 11/26/18 12:06:00               Deflated  11/26/18 12:25:00    Applied By                Ruby Cola              Outcome Met (O.60)              Yes    Last Modified By:         Ria Comment RN, Stanton Kidney K                              11/26/18 12:24:25      [facility] - Tourniquet Audit                                                                          11/26/18 12:24:25         Owner: PAFFMA                               Modifier: PAFFMA                                                            1     <*> Tourniquet Type                        TOURNIQUET PNEUMATIC            1     <+> Deflated        [facility] - Medications                                                                                                        Entry 1                                                                                                           Time Administered         11/26/18 12:15:00               Medication  BUPIVACAINE HCL 0.5%                                                                                              INJECTION 0.5% 30ML    Route of Admin            Local Injection                 Dose/Volume                     30ML                                                              (include amount and                                                               unit of measure)     Site                      Knee                            Site Detail                     Right    Administered By           Ruby Cola              Outcome Met (O.130)             Yes    Last Modified By:         Ria Comment RN, Stanton Kidney K                              11/26/18 12:09:35      [facility] - Dressing/Packing                                                                                                   Entry 1  Site                      Knee    Dressing Item     Details      Dressing Item            Band-Aid, 4x4's, ABD,           Cast/Splint (Im.290)            Cast Padding     (Im.290)                 Elastic wrap    Last Modified By:         Ria Comment RN, Stanton Kidney K                              11/26/18 12:09:06      [facility] - Procedures                                                                                                         Entry 1                                                                                                          Procedure     Description      Procedure                Knee Arthroscopy                Modifiers                       Right                              Operative     Surgical Procedure       RIGHT KNEE ARTHROSCOPY,     Text                     PARTIAL MEDIAL                              MENISCECTOMY,                               CHONDROPLASTY    Primary Procedure         Yes  Primary Surgeon                 Ruby Cola    Start                     11/26/18 12:06:00               Stop                            11/26/18 12:25:00    Anesthesia Type           General                         Surgical Service                Orthopedic (SN)    Wound Class               1-Clean    Last Modified By:         Marlon Pel K                              11/26/18 12:31:27      [facility] - Procedures Audit                                                                          11/26/18 12:31:27         Owner: PAFFMA                               Modifier: PAFFMA                                                        <+> 1         Stop     11/26/18 12:22:20         Owner: PAFFMA                               Modifier: PAFFMA                                                            1     <*> Procedure                              Knee Arthroscopy Operative            1     <*> Surgical Procedure Text                RIGHT KNEE ARTHROSCOPY, PARTIAL MEDIAL MENISCECTOMY  [facility] - Transfer                                                                                                           Entry 1                                                                                                          Transferred By            Renata Caprice         Via                             Stretcher                              Beacher May, RN, [facility] K    Post-op Destination       PACU    Skin Assessment      Condition                Intact    Last Modified By:         Marlon Pel K                              11/26/18 12:13:28      Case Comments                                                                                         <None>              Finalized By: Karalee Height      Document Signatures                                                                             Signed By:  Ria Comment RN, [facility] K 11/26/18 12:31

## 2018-11-26 NOTE — Assessment & Plan Note (Signed)
PreOp Record - [place] Record - [facility] Summary                                                                     Primary Physician:        Ruby Cola    Case Number:              [id number]    Finalized Date/Time:      11/26/18 11:35:43    Pt. Name:                 Lorraine Freeman, Lorraine Freeman    D.O.B./Sex:               06/26/1945    Female    Med Rec #:                [id number]    Physician:                Ruby Cola    Financial #:              [id number]    Pt. Type:                 S    Room/Bed:                 /    Admit/Disch:              11/26/18 09:03:00 -    Institution:       [facility] Case Attendance - PreOp                                                                                              Entry 1                         Entry 2                                                                          Case Attendee             Cathlyn Parsons, RN, SUSAN    Role Performed            Surgeon Primary                 Preoperative Nurse    Time In     Time Out  Last Modified By:         Marshell Levan RN, Glenis Smoker, RN, SUSAN                              11/26/18 10:49:51               11/26/18 10:49:51      [facility] - Case Times - PreOp                                                                                                 Entry 1                                                                                                          Patient In Room Time      11/26/18 10:51:00               Nurse In Time                   11/26/18 10:51:00    Nurse Out Time            11/26/18 11:35:00               Patient Ready for               11/26/18 11:35:00                                                              Surgery/Procedure     Last Modified By:         Marshell Levan, RN, SUSAN                              11/26/18 11:35:31      [facility] - Case Times - PreOp Audit                                                                  11/26/18 11:35:31          Owner: Norwood Levo  Modifier: [email or web address]                                                        <+> 1         Patient Ready for Surgery/Procedure        <+> 1         Nurse Out Time                Finalized By: Marshell Levan RN, SUSAN      Document Signatures                                                                             Signed By:           Marshell Levan RN, [place] 11/26/18 11:35

## 2018-11-26 NOTE — Nursing Note (Signed)
Nursing Discharge Summary - Text       Nursing Discharge Summary Entered On:  11/26/2018 12:38 EDT    Performed On:  11/26/2018 12:38 EDT by Richardson Landry, RN, DAWN R               DC Information   Discharge To :   Home independently   Mode of Discharge :   Wheelchair   Transportation :   Private vehicle   Accompanied By :   Lezlie Octave, RN, DAWN R - 11/26/2018 12:38 EDT

## 2018-11-26 NOTE — Op Note (Signed)
Phase I Record - [place] I Record - [place] Summary                                                                   Primary Physician:        Ruby Cola    Case Number:              [id number]    Finalized Date/Time:      11/26/18 13:15:29    Pt. Name:                 Lorraine Freeman, Lorraine Freeman    D.O.B./Sex:               01/28/1946    Female    Med Rec #:                [id number]    Physician:                Ruby Cola    Financial #:              [id number]    Pt. Type:                 S    Room/Bed:                 /    Admit/Disch:              11/26/18 09:03:00 -    Institution:       [facility] Case Attendance - Phase I                                                                                            Entry 1                         Entry 2                                                                          Case Attendee             Oretha Ellis, RN, DAWN R    Role Performed            Surgeon Primary                 Post Anesthesia Care  Nurse    Time In     Time Out     Last Modified By:         Richardson Landry RN, DAWN Glennon Mac, RN, DAWN R                              11/26/18 12:26:31               11/26/18 12:26:31      J[facility] - Case Times - Phase I                                                                                               Entry 1                                                                                                          Phase I In                11/26/18 12:29:00               Phase I Out                     11/26/18 13:15:00    Phase I Discharge         11/26/18 13:15:00    Time     Last Modified By:         Richardson Landry, RN, DAWN R                              11/26/18 13:15:28      [facility] - Case Times - Phase I Audit                                                                11/26/18 13:15:28         Owner: [email or web address]                              Modifier:  [id number]                                                       <+>  1         Phase I Out        <+> 1         Phase I Discharge Time                Finalized By: Richardson Landry RN, DAWN R      Document Signatures                                                                             Signed By:           Richardson Landry RN, DAWN R 11/26/18 13:15

## 2018-11-26 NOTE — Case Communication (Signed)
Discharge Follow-Up Form - Text       Discharge Follow-Up Entered On:  11/27/2018 8:59 EDT    Performed On:  11/27/2018 8:59 EDT by Rosana Hoes, RN, [place]   Provider Follow-Up Post Discharge RTF :   Follow-Up Appointments    With:  Jonna Munro  Address:  [address], [place], [place], [address];[phone]  When:  In 7 days  Comment:  Call Beth at [phone] to schedule follow up appointment within 7-10 days       DAVIS, RN, Evelina Dun - 11/27/2018 8:59 EDT   Surgery Evaluation   CM Surg DC Instr Clr :   Yes   CM Surg Pain Controlled :   Yes   CM Surg Nausea/Vomiting :   No   CM Surg FU Appt :   Yes   DAVIS, RN, TRACY E - 11/27/2018 8:59 EDT   Status   Case Status :   Completed   DAVIS, RN, TRACY E - 11/27/2018 8:59 EDT

## 2018-11-26 NOTE — Op Note (Signed)
Phase II Record - [facility]             Phase II Record - [facility] Summary                                                                  Primary Physician:        Ruby Cola    Case Number:              [id number]    Finalized Date/Time:      11/26/18 13:38:46    Pt. Name:                 Lorraine Freeman, Lorraine Freeman    D.O.B./Sex:               01/24/1946    Female    Med Rec #:                [id number]    Physician:                Ruby Cola    Financial #:              [id number]    Pt. Type:                 S    Room/Bed:                 /    Admit/Disch:              11/26/18 09:03:00 -    Institution:       [facility] Case Attendance - Phase II                                                                                           Entry 1                                                                                                          Case Attendee             BENNETT, RN, DAWN R             Role Performed                  Post Anesthesia Care  Nurse    Last Modified By:         Richardson Landry, RN, DAWN R                              11/26/18 13:15:49      [facility] - Case Times - Phase II                                                                                              Entry 1                                                                                                          Phase II In               11/26/18 13:15:00               Phase II Out                    11/26/18 13:30:00    Phase II Discharge        11/26/18 13:38:00    Time     Last Modified By:         Richardson Landry, RN, DAWN R                              11/26/18 13:38:44      [facility] - Case Times - Phase II Audit                                                               11/26/18 13:38:44         Owner: [email or web address]                              Modifier: [id number]                                                       <+> 1         Phase II Discharge Time      11/26/18 13:19:27         Owner: [email or web address]  Modifier: [id number]                                                       <+> 1         Phase II Out     11/26/18 13:16:35         Owner: [email or web address]                              Modifier: [id number]                                                           1     <*> Phase II In                            11/26/18 13:11:00                Finalized By: Richardson Landry RN, DAWN R      Document Signatures                                                                             Signed By:           Richardson Landry RN, DAWN R 11/26/18 13:38

## 2018-11-26 NOTE — Anesthesia Post-Procedure Evaluation (Signed)
Postanesthesia Evaluation        Patient:   Lorraine Freeman, Lorraine Freeman            MRN: [id number]            FIN: [id number]               Age:   73 years     Sex:  Female     DOB:  1945/04/24   Associated Diagnoses:   None   Author:   Renata Caprice A      Postoperative Information   Post Operative Info:       Patient location: PACU.       Assessment   Postanesthesia assessment   Respiratory function: Respiratory rate, airway, and oxygen saturation are at adequate levels.     Cardiovascular function: Heart Rate stable, Blood Pressure stable.     Mental status: appropriate for level of anesthesia.     Temperature: within normal limits.     Pain Control: Adequate.     Nausea/Vomiting: Absent.     Signature Line     Electronically Signed on 11/26/2018 12:37 PM EDT   ________________________________________________   Renata Caprice A

## 2018-12-21 ENCOUNTER — Ambulatory Visit (INDEPENDENT_AMBULATORY_CARE_PROVIDER_SITE_OTHER): Payer: Medicare Other

## 2018-12-21 ENCOUNTER — Other Ambulatory Visit: Payer: Self-pay

## 2018-12-21 DIAGNOSIS — Z23 Encounter for immunization: Secondary | ICD-10-CM

## 2019-02-24 ENCOUNTER — Telehealth: Payer: Self-pay | Admitting: Neurology

## 2019-02-24 NOTE — Telephone Encounter (Signed)
Duplicate see other note Pt needs appt

## 2019-02-24 NOTE — Telephone Encounter (Signed)
Pt last seen 04/17/2016 she will need an appt in office or VV

## 2019-02-24 NOTE — Telephone Encounter (Signed)
No refills until seen in office per hospital protocol Called patient to make her aware of this no answer left message to call office back to schedule appt with Dr. Tomi Likens

## 2019-02-24 NOTE — Telephone Encounter (Signed)
Patient is having headaches and made a virtual appt on 03-05-19 with Jaffe. She would like to know if we can call her in something to get her to the appt. She uses the [facility] on [address]

## 2019-02-24 NOTE — Telephone Encounter (Signed)
Patient called regarding her Tension Headache medication. She said she cannot remember the name of the medication. She was seen last on 04/17/2016. [facility] uses [facility] on [address]. Can she have a refill or does she need a v v? Please Advise. Thank you

## 2019-03-02 ENCOUNTER — Other Ambulatory Visit: Payer: Self-pay | Admitting: Family Medicine

## 2019-03-04 NOTE — Progress Notes (Signed)
Virtual Visit via Video Note The purpose of this virtual visit is to provide medical care while limiting exposure to the novel coronavirus.    Consent was obtained for video visit:  Yes.   Answered questions that patient had about telehealth interaction:  Yes.   I discussed the limitations, risks, security and privacy concerns of performing an evaluation and management service by telemedicine. I also discussed with the patient that there may be a patient responsible charge related to this service. The patient expressed understanding and agreed to proceed.  Pt location: Home Physician Location: Home Name of referring provider:  Laurey Morale, MD I connected with Maria Schneider at patients initiation/request on 03/05/2019 at  9:30 AM EST by video enabled telemedicine application and verified that I am speaking with the correct person using two identifiers. Pt MRN:  [id number] Pt DOB:  07-20-1945 Video Participants:  Maria Schneider   History of Present Illness:  Maria Schneider is a 73 year old right-handed female who follows up for headache.  UPDATE: Last seen in January 2018.  At that time, headaches were well controlled and topiramate was discontinued.  2 weeks ago she started getting another headache, similar to prior headaches.  It lasted about 5 days and slowly resolved.   Current NSAIDS:  naproxen 500mg  Current analgesics:  no Current triptans:  no Current anti-emetic:  none  Current muscle relaxants:  no Current anti-anxiolytic:  no Current sleep aide:  no Current Antihypertensive medications:  Toprol-XL 25mg , losartan Current Antidepressant medications:  sertraline 100mg , Wellbutrin Current Anticonvulsant medications: none Current Vitamins/Herbal/Supplements:  no Current Antihistamines/Decongestants:  no Other therapy:  no  Caffeine:  decaff Alcohol:  rarely Smoker:  no Diet:  Does not drink enough water Exercise:  No Depression/stress:  stable Sleep hygiene:   good  HISTORY: Onset:  January 2017. No preceding event. At first, it was believed to be sinusitis and she was treated with 3 rounds of antibiotics. Location: Bi-frontal/temporal and top of head Quality: Non-throbbing, squeezing Initial Intensity: Starts as dull headache in morning and gradually increases to 6-7/10 Aura: no Prodrome: no Associated symptoms: Photophobia. No nausea, phonophobia or visual disturbance. Initial Duration: Wakes up with it and lasts until evening. Initial Frequency: 3 to 4 days per week (once a week severe) Triggers:  Unknown Relieving factors:  Unknown Activity: Cannot function if severe  CT Head from 05/24/15 was unremarkable. MRI of brain with and without contrast on 07/17/15 and was unremarkable. She saw ophthalmology on 07/15/15, which revealed early cataracts and retinal drusen, but nothing significant.  Past NSAIDS: diclofenac Past analgesics: extra-strength Tylenol Past anti-emetic:  Zofran  Past antiepileptics: Topiramate 25mg  at bedtime  Family history of headache: no  Past Medical History: Past Medical History:  Diagnosis Date  . Allergy   . Arthritis   . Asthma   . Cancer ([place])    hx skin cancer  . Depression   . Detrusor instability   . Diverticulosis    not symptomatic  . GERD (gastroesophageal reflux disease)   . History of kidney stones   . History of pancreatitis    April 2015  . History of skin cancer   . Hyperlipidemia   . Hypertension   . Osteopenia   . Urinary incontinence   . Vitamin D deficiency    RESOLVED     Medications: Outpatient Encounter Medications as of 03/05/2019  Medication Sig Note  . acetaminophen (TYLENOL) 500 MG tablet Take 1,000 mg by mouth every 6 (  six) hours as needed for moderate pain.   [name] buPROPion (WELLBUTRIN XL) 300 MG 24 hr tablet Take 300 mg by mouth daily.     . Cholecalciferol (VITAMIN D PO) Take 5,000 Units by mouth daily. Reported on 06/09/2015   . ciprofloxacin (CIPRO)  500 MG tablet Take 1 tablet (500 mg total) by mouth 2 (two) times daily.   [name] esomeprazole (NEXIUM) 20 MG capsule Take 20 mg by mouth daily.  10/25/2017: OTC  . ezetimibe (ZETIA) 10 MG tablet Take 1 tablet (10 mg total) by mouth daily. Please schedule a physical for more refills. thanks   . fenofibrate 160 MG tablet TAKE 1 TABLET BY MOUTH EVERY DAY   . metoprolol succinate (TOPROL-XL) 25 MG 24 hr tablet TAKE 1 TABLET BY MOUTH EVERY DAY   . MYRBETRIQ 50 MG TB24 tablet Take 50 mg by mouth daily.   . sertraline (ZOLOFT) 100 MG tablet Take 200 mg by mouth daily.    . valsartan (DIOVAN) 80 MG tablet Take 1 tablet (80 mg total) by mouth daily.    No facility-administered encounter medications on file as of 03/05/2019.    Allergies: Allergies  Allergen Reactions  . Ace Inhibitors     REACTION: cough  . Codeine Nausea And Vomiting  . Fentanyl Itching  . Statins     REACTION: MYALGIAS-muscle pain    Family History: Family History  Problem Relation Age of Onset  . Hypertension Mother   . Osteoporosis Mother   . Heart disease Father   . Osteoporosis Sister   . Breast cancer Paternal Aunt        Age 47's  . Diabetes Paternal Uncle     Social History: Social History   Socioeconomic History  . Marital status: Married    Spouse name: Not on file  . Number of children: Not on file  . Years of education: Not on file  . Highest education level: Not on file  Occupational History  . Not on file  Tobacco Use  . Smoking status: Former Smoker    Years: 42.00    Types: Cigarettes    Quit date: 07/09/2008    Years since quitting: 10.6  . Smokeless tobacco: Never Used  . Tobacco comment: 9 years ago  Substance and Sexual Activity  . Alcohol use: Yes    Alcohol/week: 2.0 standard drinks    Types: 2 Glasses of wine per week    Comment: OCC  . Drug use: No  . Sexual activity: Never    Birth control/protection: Surgical  Other Topics Concern  . Not on file  Social History Narrative    Gets regular exercise      Social Determinants of Health   Financial Resource Strain:   . Difficulty of Paying Living Expenses: Not on file  Food Insecurity:   . Worried About Charity fundraiser in the Last Year: Not on file  . Ran Out of Food in the Last Year: Not on file  Transportation Needs:   . Lack of Transportation (Medical): Not on file  . Lack of Transportation (Non-Medical): Not on file  Physical Activity:   . Days of Exercise per Week: Not on file  . Minutes of Exercise per Session: Not on file  Stress:   . Feeling of Stress : Not on file  Social Connections:   . Frequency of Communication with Friends and Family: Not on file  . Frequency of Social Gatherings with Friends and Family: Not on file  .  Attends Religious Services: Not on file  . Active Member of Clubs or Organizations: Not on file  . Attends Archivist Meetings: Not on file  . Marital Status: Not on file  Intimate Partner Violence:   . Fear of Current or Ex-Partner: Not on file  . Emotionally Abused: Not on file  . Physically Abused: Not on file  . Sexually Abused: Not on file    Observations/Objective:   Height 5\' 5"  (1.651 m), weight 170 lb (77.1 kg). No acute distress.  Alert and oriented.  Speech fluent and not dysarthric.  Language intact.  Eyes orthophoric on primary gaze.  Face symmetric.  Assessment and Plan:   Tension type headache, intractable.  Isolated episode.  No clear trigger but possibly stress-related.  As it was an isolated episode, I would not restart topiramate.  If she should start having recurrent headaches, she will contact me and we can then restart the topiramate.  Follow Up Instructions:    -I discussed the assessment and treatment plan with the patient. The patient was provided an opportunity to ask questions and all were answered. The patient agreed with the plan and demonstrated an understanding of the instructions.   The patient was advised to call back or  seek an in-person evaluation if the symptoms worsen or if the condition fails to improve as anticipated.    Total Time spent in visit with the patient was:  15 minutes  Dudley Major, DO

## 2019-03-05 ENCOUNTER — Telehealth (INDEPENDENT_AMBULATORY_CARE_PROVIDER_SITE_OTHER): Payer: Medicare Other | Admitting: Neurology

## 2019-03-05 ENCOUNTER — Encounter: Payer: Self-pay | Admitting: Neurology

## 2019-03-05 ENCOUNTER — Other Ambulatory Visit: Payer: Self-pay

## 2019-03-05 VITALS — Ht 65.0 in | Wt 170.0 lb

## 2019-03-05 DIAGNOSIS — G44201 Tension-type headache, unspecified, intractable: Secondary | ICD-10-CM

## 2019-03-26 ENCOUNTER — Other Ambulatory Visit: Payer: Self-pay

## 2019-03-26 ENCOUNTER — Ambulatory Visit (INDEPENDENT_AMBULATORY_CARE_PROVIDER_SITE_OTHER): Payer: Medicare PPO | Admitting: Internal Medicine

## 2019-03-26 ENCOUNTER — Encounter: Payer: Self-pay | Admitting: Internal Medicine

## 2019-03-26 VITALS — BP 142/80 | HR 63 | Temp 97.3°F | Ht 65.0 in | Wt 170.4 lb

## 2019-03-26 DIAGNOSIS — E785 Hyperlipidemia, unspecified: Secondary | ICD-10-CM | POA: Diagnosis not present

## 2019-03-26 DIAGNOSIS — R0609 Other forms of dyspnea: Secondary | ICD-10-CM

## 2019-03-26 DIAGNOSIS — Z8249 Family history of ischemic heart disease and other diseases of the circulatory system: Secondary | ICD-10-CM | POA: Diagnosis not present

## 2019-03-26 DIAGNOSIS — R079 Chest pain, unspecified: Secondary | ICD-10-CM

## 2019-03-26 DIAGNOSIS — I10 Essential (primary) hypertension: Secondary | ICD-10-CM | POA: Diagnosis not present

## 2019-03-26 DIAGNOSIS — R0600 Dyspnea, unspecified: Secondary | ICD-10-CM

## 2019-03-26 NOTE — Patient Instructions (Addendum)
Medication Instructions:  Your physician recommends that you continue on your current medications as directed. Please refer to the Current Medication list given to you today. *If you need a refill on your cardiac medications before your next appointment, please call your pharmacy*  Lab Work: Your physician recommends that you return for lab work within 1 week: LIPID PANEL  If you have labs (blood work) drawn today and your tests are completely normal, you will receive your results only by: [name] MyChart Message (if you have MyChart) OR . A paper copy in the mail If you have any lab test that is abnormal or we need to change your treatment, we will call you to review the results.  Testing/Procedures: Your physician has requested that you have an echocardiogram. Echocardiography is a painless test that uses sound waves to create images of your heart. It provides your doctor with information about the size and shape of your heart and how well your heart's chambers and valves are working. This procedure takes approximately one hour. There are no restrictions for this procedure. LOCATION: [facility]: [place], [place], [place] [address]  Your physician has requested that you have cardiac CT. Cardiac computed tomography (CT) is a painless test that uses an x-ray machine to take clear, detailed pictures of your heart. For further information please visit [email or web address]. Please follow instruction sheet as given.    Follow-Up: At [facility], you and your health needs are our priority.  As part of our continuing mission to provide you with exceptional heart care, we have created designated Provider Care Teams.  These Care Teams include your primary Cardiologist (physician) and Advanced Practice Providers (APPs -  Physician Assistants and Nurse Practitioners) who all work together to provide you with the care you need, when you need it.  Your  next appointment:   1 month(s)  The format for your next appointment:   In Person  Provider:   Cherlynn Kaiser, MD    Other Instructions  Your cardiac CT will be scheduled at one of the below locations:   [facility] [address] [place], [place] [address] [phone]  [place] [address] [place],  [address] [phone]  If scheduled at [facility], please arrive at the [facility] main entrance of [facility] 30-45 minutes prior to test start time. Proceed to the [facility] Radiology Department (first floor) to check-in and test prep.  If scheduled at [facility], please arrive 15 mins early for check-in and test prep.  Please follow these instructions carefully (unless otherwise directed):   On the Night Before the Test: . Be sure to Drink plenty of water. . Do not consume any caffeinated/decaffeinated beverages or chocolate 12 hours prior to your test. . Do not take any antihistamines 12 hours prior to your test.  On the Day of the Test: . Drink plenty of water. Do not drink any water within one hour of the test. . Do not eat any food 4 hours prior to the test. . You may take your regular medications prior to the test.  . FEMALES- please wear underwire-free bra if available        After the Test: . Drink plenty of water. . After receiving IV contrast, you may experience a mild flushed feeling. This is normal. . On occasion, you may experience a mild rash up  to 24 hours after the test. This is not dangerous. If this occurs, you can take Benadryl 25 mg and increase your fluid intake. . If you experience trouble breathing, this can be serious. If it is severe call 911 IMMEDIATELY. If it is mild, please call our office.    Once we have confirmed authorization from your insurance company, we will call you to set up a date and time for your  test.   For non-scheduling related questions, please contact the cardiac imaging nurse navigator should you have any questions/concerns: Marchia Bond, RN Navigator Cardiac Imaging [facility] [phone] Office     How to Take Your Blood Pressure You can take your blood pressure at home with a machine. You may need to check your blood pressure at home:  To check if you have high blood pressure (hypertension).  To check your blood pressure over time.  To make sure your blood pressure medicine is working. Supplies needed: You will need a blood pressure machine, or monitor. You can buy one at a drugstore or online. When choosing one:  Choose one with an arm cuff.  Choose one that wraps around your upper arm. Only one finger should fit between your arm and the cuff.  Do not choose one that measures your blood pressure from your wrist or finger. Your doctor can suggest a monitor. How to prepare Avoid these things for 30 minutes before checking your blood pressure:  Drinking caffeine.  Drinking alcohol.  Eating.  Smoking.  Exercising. Five minutes before checking your blood pressure:  Pee.  Sit in a dining chair. Avoid sitting in a soft couch or armchair.  Be quiet. Do not talk. How to take your blood pressure Follow the instructions that came with your machine. If you have a digital blood pressure monitor, these may be the instructions: 1. Sit up straight. 2. Place your feet on the floor. Do not cross your ankles or legs. 3. Rest your left arm at the level of your heart. You may rest it on a table, desk, or chair. 4. Pull up your shirt sleeve. 5. Wrap the blood pressure cuff around the upper part of your left arm. The cuff should be 1 inch (2.5 cm) above your elbow. It is best to wrap the cuff around bare skin. 6. Fit the cuff snugly around your arm. You should be able to place only one finger between the cuff and your arm. 7. Put the cord  inside the groove of your elbow. 8. Press the power button. 9. Sit quietly while the cuff fills with air and loses air. 10. Write down the numbers on the screen. 11. Wait 2-3 minutes and then repeat steps 1-10. What do the numbers mean? Two numbers make up your blood pressure. The first number is called systolic pressure. The second is called diastolic pressure. An example of a blood pressure reading is "120 over 80" (or 120/80). If you are an adult and do not have a medical condition, use this guide to find out if your blood pressure is normal: Normal  First number: below 120.  Second number: below 80. Elevated  First number: 120-129.  Second number: below 80. Hypertension stage 1  First number: 130-139.  Second number: 80-89. Hypertension stage 2  First number: 140 or above.  Second number: 33 or above. Your blood pressure is above normal even if only the top or bottom number is above normal. Follow these instructions at home:  Check your blood pressure TWICE A DAY, MORNING AND EVENING.  Take your monitor to your next doctor's appointment. Your doctor will: ? Make sure you are using it correctly. ? Make sure it is working right.  Make sure you understand what your blood pressure numbers should be.  Tell your doctor if your medicines are causing side effects. Contact a doctor if:  Your blood pressure keeps being high. Get help right away if:  Your first blood pressure number is higher than 180.  Your second blood pressure number is higher than 120. This information is not intended to replace advice given to you by your health care provider. Make sure you discuss any questions you have with your health care provider. Document Revised: 02/16/2017 Document Reviewed: 08/13/2015 Elsevier Patient Education  2020 [employer].

## 2019-03-26 NOTE — Progress Notes (Signed)
Cardiology Office Note:    Date:  03/26/2019   ID:  Maria Schneider, DOB [birth year], MRN [id number]  PCP:  Laurey Morale, MD  Cardiologist:  No primary care provider on file.  Electrophysiologist:  None   Referring MD: Laurey Morale, MD   Chief Complaint: Chest pain  History of Present Illness:    Maria Schneider is a 74 y.o. female with a hx of asthma, depression, GERD, HTN, HLD, who presents today with chest pressure with exertion and DOE.   Bilateral hip surgery and sedentary aftewrard  Takes the stairs and with 13 stairs, chest pain and sob, 2-3 mins to recover. Substernal CP, nonradiating. Nothing taken to improve pain.  Went to [employer], fast walking around the store, pressure on chest, walked back to car, 5 mins recovery.  Strong fhx of cad  Exercise - Walking on treadmill, 1.7 mph and 8/10 of a mile. No CP with this but gets hot quickly, sweating with activity.   Weight gain after surgery, oct 2017, aug 2019.   Smoking - 3/4 ppd, 42 years, quit for 10.5 years.   fhx - father first mi 75 yo, 3 before 31, passed at 45. 3 grand parents had cad, early MI, all fathers side heart disease. Mother mi in 1s  Brother 75 abnl stress test and stents  Pt had normal stress test 10-15 years ago.   Past Medical History:  Diagnosis Date  . Allergy   . Arthritis   . Asthma   . Cancer ([place])    hx skin cancer  . Depression   . Detrusor instability   . Diverticulosis    not symptomatic  . GERD (gastroesophageal reflux disease)   . History of kidney stones   . History of pancreatitis    April 2015  . History of skin cancer   . Hyperlipidemia   . Hypertension   . Osteopenia   . Urinary incontinence   . Vitamin D deficiency    RESOLVED     Past Surgical History:  Procedure Laterality Date  . ABDOMINAL HYSTERECTOMY  1994   TAH/ BLADDER SUSPENSION  . BREAST BIOPSY  1972   benign  . CHOLECYSTECTOMY N/A 07/12/2013   Procedure: LAPAROSCOPIC CHOLECYSTECTOMY WITH  INTRAOPERATIVE CHOLANGIOGRAM;  Surgeon: Edward Jolly, MD;  Location: [facility];  Service: General;  Laterality: N/A;  . COLONOSCOPY  06-24-12   per Dr. Earlean Shawl, benign polyps, repeat in 5 yrs    . ERCP N/A 07/14/2013   Procedure: ENDOSCOPIC RETROGRADE CHOLANGIOPANCREATOGRAPHY (ERCP);  Surgeon: Gatha Mayer, MD;  Location: [facility];  Service: Endoscopy;  Laterality: N/A;  . TONSILLECTOMY    . TOTAL HIP ARTHROPLASTY Right 01/13/2015   Procedure: RIGHT TOTAL HIP ARTHROPLASTY ANTERIOR APPROACH;  Surgeon: Gaynelle Arabian, MD;  Location: [facility];  Service: Orthopedics;  Laterality: Right;  . TOTAL HIP ARTHROPLASTY Left 11/07/2017   Procedure: LEFT TOTAL HIP ARTHROPLASTY ANTERIOR APPROACH;  Surgeon: Gaynelle Arabian, MD;  Location: [facility];  Service: Orthopedics;  Laterality: Left;  . TUBAL LIGATION      Current Medications: Current Meds  Medication Sig  . buPROPion (WELLBUTRIN XL) 300 MG 24 hr tablet Take 300 mg by mouth daily.    . Cholecalciferol (VITAMIN D PO) Take 5,000 Units by mouth daily. Reported on 06/09/2015  . esomeprazole (NEXIUM) 20 MG capsule Take 20 mg by mouth daily.   [name] ezetimibe (ZETIA) 10 MG tablet Take 1 tablet (10 mg total) by mouth daily. Please schedule a physical  for more refills. thanks  . fenofibrate 160 MG tablet TAKE 1 TABLET BY MOUTH EVERY DAY  . metoprolol succinate (TOPROL-XL) 25 MG 24 hr tablet TAKE 1 TABLET BY MOUTH EVERY DAY  . MYRBETRIQ 50 MG TB24 tablet Take 50 mg by mouth daily.  . sertraline (ZOLOFT) 100 MG tablet Take 200 mg by mouth daily.   . valsartan (DIOVAN) 80 MG tablet Take 1 tablet (80 mg total) by mouth daily.  . [DISCONTINUED] acetaminophen (TYLENOL) 500 MG tablet Take 1,000 mg by mouth every 6 (six) hours as needed for moderate pain.  . [DISCONTINUED] ciprofloxacin (CIPRO) 500 MG tablet Take 1 tablet (500 mg total) by mouth 2 (two) times daily.     Allergies:   Ace inhibitors, Codeine, Fentanyl, and Statins   Social History   Socioeconomic  History  . Marital status: Married    Spouse name: Not on file  . Number of children: 1  . Years of education: 29  . Highest education level: Not on file  Occupational History  . Occupation: retired  Tobacco Use  . Smoking status: Former Smoker    Years: 42.00    Types: Cigarettes    Quit date: 07/09/2008    Years since quitting: 10.7  . Smokeless tobacco: Never Used  . Tobacco comment: 9 years ago  Substance and Sexual Activity  . Alcohol use: Yes    Alcohol/week: 2.0 standard drinks    Types: 2 Glasses of wine per week    Comment: OCC  . Drug use: No  . Sexual activity: Never    Birth control/protection: Surgical  Other Topics Concern  . Not on file  Social History Narrative   Gets regular exercise   Right handed   Two story home   No caffeine      Social Determinants of Health   Financial Resource Strain:   . Difficulty of Paying Living Expenses: Not on file  Food Insecurity:   . Worried About Charity fundraiser in the Last Year: Not on file  . Ran Out of Food in the Last Year: Not on file  Transportation Needs:   . Lack of Transportation (Medical): Not on file  . Lack of Transportation (Non-Medical): Not on file  Physical Activity:   . Days of Exercise per Week: Not on file  . Minutes of Exercise per Session: Not on file  Stress:   . Feeling of Stress : Not on file  Social Connections:   . Frequency of Communication with Friends and Family: Not on file  . Frequency of Social Gatherings with Friends and Family: Not on file  . Attends Religious Services: Not on file  . Active Member of Clubs or Organizations: Not on file  . Attends Archivist Meetings: Not on file  . Marital Status: Not on file     Family History: The patient's family history includes Breast cancer in her paternal aunt; Diabetes in her paternal uncle; Heart disease in her father; Hypertension in her mother; Osteoporosis in her mother and sister.  ROS:   Please see the history  of present illness.    All other systems reviewed and are negative.  EKGs/Labs/Other Studies Reviewed:    The following studies were reviewed today:  EKG:  NSR, low voltage QRS  Recent Labs: 08/20/2018: ALT 17; BUN 20; Creatinine, Ser 1.14; Hemoglobin 12.3; Platelets 176; Potassium 4.5; Sodium 140  Recent Lipid Panel    Component Value Date/Time   CHOL 197 03/31/2019 0833  TRIG 115 03/31/2019 0833   HDL 74 03/31/2019 0833   CHOLHDL 2.7 03/31/2019 0833   CHOLHDL 3 02/18/2016 0928   VLDL 28.2 02/18/2016 0928   LDLCALC 103 (H) 03/31/2019 0833   LDLDIRECT 147.7 12/29/2011 0957    Physical Exam:    VS:  BP (!) 142/80   Pulse 63   Temp (!) 97.3 F (36.3 C)   Ht 5\' 5"  (1.651 m)   Wt 170 lb 6.4 oz (77.3 kg)   SpO2 97%   BMI 28.36 kg/m     Wt Readings from Last 5 Encounters:  03/26/19 170 lb 6.4 oz (77.3 kg)  03/05/19 170 lb (77.1 kg)  08/20/18 165 lb (74.8 kg)  11/26/17 165 lb 9.6 oz (75.1 kg)  11/09/17 164 lb (74.4 kg)     Constitutional: No acute distress Eyes: sclera non-icteric, normal conjunctiva and lids ENMT: normal dentition, moist mucous membranes Cardiovascular: regular rhythm, normal rate, no murmurs. S1 and S2 normal. Radial pulses normal bilaterally. No jugular venous distention.  Respiratory: clear to auscultation bilaterally GI : normal bowel sounds, soft and nontender. No distention.   MSK: extremities warm, well perfused. No edema.  NEURO: grossly nonfocal exam, moves all extremities. PSYCH: alert and oriented x 3, normal mood and affect.   ASSESSMENT:    1. Chest pain, unspecified type   2. Hyperlipidemia, unspecified hyperlipidemia type   3. DOE (dyspnea on exertion)   4. Chest pain of uncertain etiology   5. Essential hypertension   6. Family history of early CAD    PLAN:    Chest pain - concerning for stable angina, FHX of cad and risk factors of smoking, HTN and HLD. Will obtain CCTA to evaluate for obstructive CAD.   DOE- as above, and  will need an assessment of systolic and diastolic function, as well as valvular heart disease. Obtain echo.   HLD - needs updated lipid panel, prior LDL above goal. On zetia and fenofibrate.  HTN - above goal today, on valsartan and metoprolol. Uptitation after results of testing.   Total time of encounter: 45 minutes total time of encounter, including 30 minutes spent in face-to-face patient care. This time includes coordination of care and counseling regarding testing for CAD and workup of chest pain. Remainder of non-face-to-face time involved reviewing chart documents/testing relevant to the patient encounter and documentation in the medical record.  Cherlynn Kaiser, MD [place]  [facility]   Medication Adjustments/Labs and Tests Ordered: Current medicines are reviewed at length with the patient today.  Concerns regarding medicines are outlined above.  Orders Placed This Encounter  Procedures  . CT CORONARY MORPH W/CTA COR W/SCORE W/CA W/CM &/OR WO/CM  . CT CORONARY FRACTIONAL FLOW RESERVE DATA PREP  . CT CORONARY FRACTIONAL FLOW RESERVE FLUID ANALYSIS  . Lipid Profile  . EKG 12-Lead  . ECHOCARDIOGRAM COMPLETE   No orders of the defined types were placed in this encounter.   Patient Instructions  Medication Instructions:  Your physician recommends that you continue on your current medications as directed. Please refer to the Current Medication list given to you today. *If you need a refill on your cardiac medications before your next appointment, please call your pharmacy*  Lab Work: Your physician recommends that you return for lab work within 1 week: LIPID PANEL  If you have labs (blood work) drawn today and your tests are completely normal, you will receive your results only by: [name] MyChart Message (if you have MyChart) OR . A  paper copy in the mail If you have any lab test that is abnormal or we need to change your treatment, we will call you to review the results.   Testing/Procedures: Your physician has requested that you have an echocardiogram. Echocardiography is a painless test that uses sound waves to create images of your heart. It provides your doctor with information about the size and shape of your heart and how well your heart's chambers and valves are working. This procedure takes approximately one hour. There are no restrictions for this procedure. LOCATION: [facility]: [address], [place], [place] [address]  Your physician has requested that you have cardiac CT. Cardiac computed tomography (CT) is a painless test that uses an x-ray machine to take clear, detailed pictures of your heart. For further information please visit HugeFiesta[employer]tn. Please follow instruction sheet as given.    Follow-Up: At [facility], you and your health needs are our priority.  As part of our continuing mission to provide you with exceptional heart care, we have created designated Provider Care Teams.  These Care Teams include your primary Cardiologist (physician) and Advanced Practice Providers (APPs -  Physician Assistants and Nurse Practitioners) who all work together to provide you with the care you need, when you need it.  Your next appointment:   1 month(s)  The format for your next appointment:   In Person  Provider:   Cherlynn Kaiser, MD    Other Instructions  Your cardiac CT will be scheduled at one of the below locations:   [facility] [address] [place], [place] [address] [phone]  [facility] [address] [place], [place] [address] [phone]  If scheduled at [facility], please arrive at the [facility] main entrance of [facility] 30-45 minutes prior to test start time. Proceed to the [facility] Radiology Department (first floor) to check-in and test prep.  If scheduled at [facility], please arrive 15 mins early for check-in and test prep.  Please follow these instructions carefully (unless otherwise directed):   On the Night Before the Test: . Be sure to Drink plenty of water. . Do not consume any caffeinated/decaffeinated beverages or chocolate 12 hours prior to your test. . Do not take any antihistamines 12 hours prior to your test.  On the Day of the Test: . Drink plenty of water. Do not drink any water within one hour of the test. . Do not eat any food 4 hours prior to the test. . You may take your regular medications prior to the test.  . FEMALES- please wear underwire-free bra if available        After the Test: . Drink plenty of water. . After receiving IV contrast, you may experience a mild flushed feeling. This is normal. . On occasion, you may experience a mild rash up to 24 hours after the test. This is not dangerous. If this occurs, you can take Benadryl 25 mg and increase your fluid intake. . If you experience trouble breathing, this can be serious. If it is severe call 911 IMMEDIATELY. If it is mild, please call our office.    Once we have confirmed authorization from your insurance company, we will call you to set up a date and time for your test.   For non-scheduling related questions, please contact the cardiac imaging nurse navigator should you have  any questions/concerns: Marchia Bond, RN Navigator Cardiac Imaging [facility] [phone] Office     How to Take Your Blood Pressure You can take your blood pressure at home with a machine. You may need to check your blood pressure at home:  To check if you have high blood pressure (hypertension).  To check your blood pressure over time.  To make sure your blood pressure medicine is working. Supplies needed: You will need a blood pressure machine, or monitor. You can buy one at a drugstore or online. When choosing one:  Choose one  with an arm cuff.  Choose one that wraps around your upper arm. Only one finger should fit between your arm and the cuff.  Do not choose one that measures your blood pressure from your wrist or finger. Your doctor can suggest a monitor. How to prepare Avoid these things for 30 minutes before checking your blood pressure:  Drinking caffeine.  Drinking alcohol.  Eating.  Smoking.  Exercising. Five minutes before checking your blood pressure:  Pee.  Sit in a dining chair. Avoid sitting in a soft couch or armchair.  Be quiet. Do not talk. How to take your blood pressure Follow the instructions that came with your machine. If you have a digital blood pressure monitor, these may be the instructions: 1. Sit up straight. 2. Place your feet on the floor. Do not cross your ankles or legs. 3. Rest your left arm at the level of your heart. You may rest it on a table, desk, or chair. 4. Pull up your shirt sleeve. 5. Wrap the blood pressure cuff around the upper part of your left arm. The cuff should be 1 inch (2.5 cm) above your elbow. It is best to wrap the cuff around bare skin. 6. Fit the cuff snugly around your arm. You should be able to place only one finger between the cuff and your arm. 7. Put the cord inside the groove of your elbow. 8. Press the power button. 9. Sit quietly while the cuff fills with air and loses air. 10. Write down the numbers on the screen. 11. Wait 2-3 minutes and then repeat steps 1-10. What do the numbers mean? Two numbers make up your blood pressure. The first number is called systolic pressure. The second is called diastolic pressure. An example of a blood pressure reading is "120 over 80" (or 120/80). If you are an adult and do not have a medical condition, use this guide to find out if your blood pressure is normal: Normal  First number: below 120.  Second number: below 80. Elevated  First number: 120-129.  Second number: below 80. Hypertension  stage 1  First number: 130-139.  Second number: 80-89. Hypertension stage 2  First number: 140 or above.  Second number: 71 or above. Your blood pressure is above normal even if only the top or bottom number is above normal. Follow these instructions at home:  Check your blood pressure TWICE A DAY, MORNING AND EVENING.  Take your monitor to your next doctor's appointment. Your doctor will: ? Make sure you are using it correctly. ? Make sure it is working right.  Make sure you understand what your blood pressure numbers should be.  Tell your doctor if your medicines are causing side effects. Contact a doctor if:  Your blood pressure keeps being high. Get help right away if:  Your first blood pressure number is higher than 180.  Your second blood pressure  number is higher than 120. This information is not intended to replace advice given to you by your health care provider. Make sure you discuss any questions you have with your health care provider. Document Revised: 02/16/2017 Document Reviewed: 08/13/2015 Elsevier Patient Education  2020 [employer].

## 2019-03-31 DIAGNOSIS — E785 Hyperlipidemia, unspecified: Secondary | ICD-10-CM | POA: Diagnosis not present

## 2019-03-31 LAB — LIPID PANEL
Chol/HDL Ratio: 2.7 ratio (ref 0.0–4.4)
Cholesterol, Total: 197 mg/dL (ref 100–199)
HDL: 74 mg/dL (ref >39)
LDL Chol Calc (NIH): 103 mg/dL — ABNORMAL HIGH (ref 0–99)
Triglycerides: 115 mg/dL (ref 0–149)
VLDL Cholesterol Cal: 20 mg/dL (ref 5–40)

## 2019-04-06 DIAGNOSIS — Z20828 Contact with and (suspected) exposure to other viral communicable diseases: Secondary | ICD-10-CM | POA: Diagnosis not present

## 2019-04-08 ENCOUNTER — Ambulatory Visit (HOSPITAL_COMMUNITY): Payer: Medicare PPO | Attending: Cardiovascular Disease

## 2019-04-08 ENCOUNTER — Other Ambulatory Visit: Payer: Self-pay

## 2019-04-08 DIAGNOSIS — R0609 Other forms of dyspnea: Secondary | ICD-10-CM

## 2019-04-08 DIAGNOSIS — R0600 Dyspnea, unspecified: Secondary | ICD-10-CM | POA: Diagnosis not present

## 2019-04-09 ENCOUNTER — Other Ambulatory Visit: Payer: Self-pay

## 2019-04-09 DIAGNOSIS — Z01812 Encounter for preprocedural laboratory examination: Secondary | ICD-10-CM

## 2019-04-09 NOTE — Progress Notes (Signed)
Spoke with pt and informed that she has upcoming cardiac ct 04/17/2019 and will need lab work BMP and CBC prior to. Pt states she will present for lab work next week Monday 04/14/2019.  CBC and BMP in Epic

## 2019-04-15 ENCOUNTER — Other Ambulatory Visit: Payer: Self-pay

## 2019-04-15 DIAGNOSIS — E785 Hyperlipidemia, unspecified: Secondary | ICD-10-CM | POA: Diagnosis not present

## 2019-04-15 DIAGNOSIS — Z01812 Encounter for preprocedural laboratory examination: Secondary | ICD-10-CM | POA: Diagnosis not present

## 2019-04-15 DIAGNOSIS — I10 Essential (primary) hypertension: Secondary | ICD-10-CM | POA: Diagnosis not present

## 2019-04-15 DIAGNOSIS — Z8249 Family history of ischemic heart disease and other diseases of the circulatory system: Secondary | ICD-10-CM | POA: Diagnosis not present

## 2019-04-15 DIAGNOSIS — R079 Chest pain, unspecified: Secondary | ICD-10-CM | POA: Diagnosis not present

## 2019-04-15 DIAGNOSIS — R0600 Dyspnea, unspecified: Secondary | ICD-10-CM | POA: Diagnosis not present

## 2019-04-16 ENCOUNTER — Telehealth (HOSPITAL_COMMUNITY): Payer: Self-pay | Admitting: Emergency Medicine

## 2019-04-16 ENCOUNTER — Encounter (HOSPITAL_COMMUNITY): Payer: Self-pay

## 2019-04-16 LAB — CBC
Hematocrit: 36.4 % (ref 34.0–46.6)
Hemoglobin: 11.6 g/dL (ref 11.1–15.9)
MCH: 27.2 pg (ref 26.6–33.0)
MCHC: 31.9 g/dL (ref 31.5–35.7)
MCV: 85 fL (ref 79–97)
Platelets: 193 10*3/uL (ref 150–450)
RBC: 4.26 x10E6/uL (ref 3.77–5.28)
RDW: 13.1 % (ref 11.7–15.4)
WBC: 4.7 10*3/uL (ref 3.4–10.8)

## 2019-04-16 LAB — BASIC METABOLIC PANEL
BUN/Creatinine Ratio: 18 (ref 12–28)
BUN: 21 mg/dL (ref 8–27)
CO2: 23 mmol/L (ref 20–29)
Calcium: 9.3 mg/dL (ref 8.7–10.3)
Chloride: 104 mmol/L (ref 96–106)
Creatinine, Ser: 1.17 mg/dL — ABNORMAL HIGH (ref 0.57–1.00)
GFR calc Af Amer: 54 mL/min/{1.73_m2} — ABNORMAL LOW (ref >59)
GFR calc non Af Amer: 47 mL/min/{1.73_m2} — ABNORMAL LOW (ref >59)
Glucose: 79 mg/dL (ref 65–99)
Potassium: 4.5 mmol/L (ref 3.5–5.2)
Sodium: 141 mmol/L (ref 134–144)

## 2019-04-16 NOTE — Telephone Encounter (Signed)
Left message on voicemail with name and callback number Shelsie Tijerino RN Navigator Cardiac Imaging [facility] [phone] Office [phone] Cell  

## 2019-04-17 ENCOUNTER — Ambulatory Visit
Admission: RE | Admit: 2019-04-17 | Discharge: 2019-04-17 | Disposition: A | Discharge status: Home / Self Care | Payer: Medicare PPO | Source: Ambulatory Visit | Attending: Internal Medicine | Admitting: Internal Medicine

## 2019-04-17 ENCOUNTER — Other Ambulatory Visit: Payer: Self-pay

## 2019-04-17 DIAGNOSIS — I700 Atherosclerosis of aorta: Secondary | ICD-10-CM | POA: Diagnosis not present

## 2019-04-17 DIAGNOSIS — I2510 Atherosclerotic heart disease of native coronary artery without angina pectoris: Secondary | ICD-10-CM | POA: Insufficient documentation

## 2019-04-17 DIAGNOSIS — R079 Chest pain, unspecified: Secondary | ICD-10-CM | POA: Insufficient documentation

## 2019-04-17 MED ORDER — NITROGLYCERIN 0.4 MG SL SUBL
0.8000 mg | SUBLINGUAL_TABLET | Freq: Once | SUBLINGUAL | Status: AC
  Administered 2019-04-17: 0.8 mg via SUBLINGUAL

## 2019-04-17 MED ORDER — IOHEXOL 350 MG/ML SOLN
85.0000 mL | Freq: Once | INTRAVENOUS | Status: AC | PRN
  Administered 2019-04-17: 85 mL via INTRAVENOUS

## 2019-04-17 MED ORDER — NITROGLYCERIN 0.4 MG SL SUBL: 0.8000 mg | SUBLINGUAL_TABLET | Freq: Once | SUBLINGUAL | Status: DC

## 2019-04-17 NOTE — Progress Notes (Signed)
Patient tolerated CT without incident. Drank water after. Ambulated to exit steady gait.

## 2019-04-18 DIAGNOSIS — I2510 Atherosclerotic heart disease of native coronary artery without angina pectoris: Secondary | ICD-10-CM | POA: Diagnosis not present

## 2019-04-24 ENCOUNTER — Telehealth: Payer: Self-pay | Admitting: Internal Medicine

## 2019-04-24 NOTE — Telephone Encounter (Signed)
Patient would like her husband, Gershon Mussel, to accompany her to her appt on Monday with Dr. Margaretann Loveless. She states that she has a bad memory and would like his company.

## 2019-04-24 NOTE — Telephone Encounter (Signed)
Patient is returning call in regards to having her husband accompany her during her appointment. Please call.

## 2019-04-24 NOTE — Telephone Encounter (Signed)
lmtcb

## 2019-04-25 NOTE — Telephone Encounter (Signed)
LEFT MESSAGE , OKAY FOR HUSBAND TO ATTEND APPT 04/28/19. ANY QUESTION MAY CALL BACK

## 2019-04-26 ENCOUNTER — Other Ambulatory Visit: Payer: Self-pay | Admitting: Family Medicine

## 2019-04-28 ENCOUNTER — Other Ambulatory Visit: Payer: Self-pay

## 2019-04-28 ENCOUNTER — Ambulatory Visit: Payer: Medicare PPO | Admitting: Internal Medicine

## 2019-04-28 ENCOUNTER — Encounter: Payer: Self-pay | Admitting: Internal Medicine

## 2019-04-28 VITALS — BP 158/76 | HR 69 | Ht 65.0 in | Wt 170.8 lb

## 2019-04-28 DIAGNOSIS — Z8249 Family history of ischemic heart disease and other diseases of the circulatory system: Secondary | ICD-10-CM | POA: Diagnosis not present

## 2019-04-28 DIAGNOSIS — E785 Hyperlipidemia, unspecified: Secondary | ICD-10-CM

## 2019-04-28 DIAGNOSIS — I2510 Atherosclerotic heart disease of native coronary artery without angina pectoris: Secondary | ICD-10-CM | POA: Diagnosis not present

## 2019-04-28 DIAGNOSIS — I739 Peripheral vascular disease, unspecified: Secondary | ICD-10-CM

## 2019-04-28 DIAGNOSIS — R0609 Other forms of dyspnea: Secondary | ICD-10-CM

## 2019-04-28 DIAGNOSIS — R0600 Dyspnea, unspecified: Secondary | ICD-10-CM | POA: Diagnosis not present

## 2019-04-28 DIAGNOSIS — I10 Essential (primary) hypertension: Secondary | ICD-10-CM

## 2019-04-28 NOTE — Telephone Encounter (Signed)
Last filled 02/21/2018 Last OV 11/26/2017   Should the patient continue this medication?

## 2019-04-28 NOTE — Patient Instructions (Signed)
Medication Instructions:  NO CHANGES *If you need a refill on your cardiac medications before your next appointment, please call your pharmacy*  Lab Work: NOT NEEDED  Testing/Procedures: Will be schedule at [facility] has requested that you have an ankle brachial index (ABI). During this test an ultrasound and blood pressure cuff are used to evaluate the arteries that supply the arms and legs with blood. Allow thirty minutes for this exam. There are no restrictions or special instructions. AND Your physician has requested that you have a lower  extremity arterial duplex. This test is an ultrasound of the arteries in the legs. It looks at arterial blood flow in the legs. Allow one hour for Lower Arterial scans. There are no restrictions or special instructions  Follow-Up:  At [facility], you and your health needs are our priority.  As part of our continuing mission to provide you with exceptional heart care, we have created designated Provider Care Teams.  These Care Teams include your primary Cardiologist (physician) and Advanced Practice Providers (APPs -  Physician Assistants and Nurse Practitioners) who all work together to provide you with the care you need, when you need it.  Your next appointment:    4 TO 6 week(s)  The format for your next appointment:   In Person  Provider:   Cherlynn Kaiser, MD  Other Instructions You have been referred to [facility]- discuss possible  Cholesterol injectable

## 2019-04-28 NOTE — Progress Notes (Signed)
Cardiology Office Note:    Date:  04/28/2019   ID:  Maria Schneider, DOB December 04, 1945, MRN [id number]  PCP:  Laurey Morale, MD  Cardiologist:  No primary care provider on file.  Electrophysiologist:  None   Referring MD: Laurey Morale, MD   Chief Complaint: f/u chest pain  History of Present Illness:    Maria Schneider is a 74 y.o. female with a history of asthma, depression, GERD, HTN, HLD, who presents today for follow up of chest pressure with exertion and DOE.  The patient presents with her husband today.    We spoke at great length about the natural history of coronary artery disease and coronary artery calcifications.  I answered all questions to the best of my ability.  We reviewed test results including echocardiogram and coronary CT angiography.   The patient and her husband had questions about calculation of coronary artery calcium score, quantitation of calcification degree per [employer].  We discussed grading of coronary lesions by coronary CTA versus conventional angiography.  We discussed medical management versus interventional strategy for symptomatic coronary artery disease.  We discussed the technical aspects of CT fractional flow reserve, ranges of severity of fractional flow reserve and management of indeterminate lesions such as the lesion in her main circumflex artery.  Patient also tells me that she has claudication.  She states that her calves will cramp after approximately 5 minutes of activity.  Her recovery takes approximately 5 minutes after which she is able to continue walking.  She will have reproducible symptoms on resuming activity.  She is a former smoker.  We discussed reinitiating aspirin therapy given moderate coronary artery disease.  Intolerant to statins, discussed PCSK9i for HLD, secondary prevention of CAD.   Past Medical History:  Diagnosis Date  . Allergy   . Arthritis   . Asthma   . Cancer ([place])    hx skin cancer  . Depression   .  Detrusor instability   . Diverticulosis    not symptomatic  . GERD (gastroesophageal reflux disease)   . History of kidney stones   . History of pancreatitis    April 2015  . History of skin cancer   . Hyperlipidemia   . Hypertension   . Osteopenia   . Urinary incontinence   . Vitamin D deficiency    RESOLVED     Past Surgical History:  Procedure Laterality Date  . ABDOMINAL HYSTERECTOMY  1994   TAH/ BLADDER SUSPENSION  . BREAST BIOPSY  1972   benign  . CHOLECYSTECTOMY N/A 07/12/2013   Procedure: LAPAROSCOPIC CHOLECYSTECTOMY WITH INTRAOPERATIVE CHOLANGIOGRAM;  Surgeon: Edward Jolly, MD;  Location: [facility];  Service: General;  Laterality: N/A;  . COLONOSCOPY  06-24-12   per Dr. Earlean Shawl, benign polyps, repeat in 5 yrs    . ERCP N/A 07/14/2013   Procedure: ENDOSCOPIC RETROGRADE CHOLANGIOPANCREATOGRAPHY (ERCP);  Surgeon: Gatha Mayer, MD;  Location: [facility];  Service: Endoscopy;  Laterality: N/A;  . TONSILLECTOMY    . TOTAL HIP ARTHROPLASTY Right 01/13/2015   Procedure: RIGHT TOTAL HIP ARTHROPLASTY ANTERIOR APPROACH;  Surgeon: Gaynelle Arabian, MD;  [facility];  Service: Orthopedics;  Laterality: Right;  . TOTAL HIP ARTHROPLASTY Left 11/07/2017   Procedure: LEFT TOTAL HIP ARTHROPLASTY ANTERIOR APPROACH;  Surgeon: Gaynelle Arabian, MD;  [facility];  Service: Orthopedics;  Laterality: Left;  . TUBAL LIGATION      Current Medications: Current Meds  Medication Sig  . buPROPion (WELLBUTRIN XL) 300  MG 24 hr tablet Take 300 mg by mouth daily.    . Cholecalciferol (VITAMIN D PO) Take 5,000 Units by mouth daily. Reported on 06/09/2015  . esomeprazole (NEXIUM) 20 MG capsule Take 20 mg by mouth daily.   [name] ezetimibe (ZETIA) 10 MG tablet Take 1 tablet (10 mg total) by mouth daily. Please schedule a physical for more refills. thanks  . fenofibrate 160 MG tablet TAKE 1 TABLET BY MOUTH EVERY DAY  . metoprolol succinate (TOPROL-XL) 25 MG 24 hr tablet TAKE 1 TABLET BY MOUTH EVERY  DAY  . MYRBETRIQ 50 MG TB24 tablet Take 50 mg by mouth daily.  . sertraline (ZOLOFT) 100 MG tablet Take 200 mg by mouth daily.   . valsartan (DIOVAN) 80 MG tablet Take 1 tablet (80 mg total) by mouth daily.     Allergies:   Ace inhibitors, Codeine, Fentanyl, and Statins   Social History   Socioeconomic History  . Marital status: Married    Spouse name: Not on file  . Number of children: 1  . Years of education: 28  . Highest education level: Not on file  Occupational History  . Occupation: retired  Tobacco Use  . Smoking status: Former Smoker    Years: 42.00    Types: Cigarettes    Quit date: 07/09/2008    Years since quitting: 10.8  . Smokeless tobacco: Never Used  . Tobacco comment: 9 years ago  Substance and Sexual Activity  . Alcohol use: Yes    Alcohol/week: 2.0 standard drinks    Types: 2 Glasses of wine per week    Comment: OCC  . Drug use: No  . Sexual activity: Never    Birth control/protection: Surgical  Other Topics Concern  . Not on file  Social History Narrative   Gets regular exercise   Right handed   Two story home   No caffeine      Social Determinants of Health   Financial Resource Strain:   . Difficulty of Paying Living Expenses: Not on file  Food Insecurity:   . Worried About Charity fundraiser in the Last Year: Not on file  . Ran Out of Food in the Last Year: Not on file  Transportation Needs:   . Lack of Transportation (Medical): Not on file  . Lack of Transportation (Non-Medical): Not on file  Physical Activity:   . Days of Exercise per Week: Not on file  . Minutes of Exercise per Session: Not on file  Stress:   . Feeling of Stress : Not on file  Social Connections:   . Frequency of Communication with Friends and Family: Not on file  . Frequency of Social Gatherings with Friends and Family: Not on file  . Attends Religious Services: Not on file  . Active Member of Clubs or Organizations: Not on file  . Attends Archivist  Meetings: Not on file  . Marital Status: Not on file     Family History: The patient's family history includes Breast cancer in her paternal aunt; Diabetes in her paternal uncle; Heart disease in her father; Hypertension in her mother; Osteoporosis in her mother and sister.  ROS:   Please see the history of present illness.    All other systems reviewed and are negative.  EKGs/Labs/Other Studies Reviewed:    The following studies were reviewed today:  EKG: Not performed today  Recent Labs: 08/20/2018: ALT 17 04/15/2019: BUN 21; Creatinine, Ser 1.17; Hemoglobin 11.6; Platelets 193; Potassium 4.5; Sodium  141  Recent Lipid Panel    Component Value Date/Time   CHOL 197 03/31/2019 0833   TRIG 115 03/31/2019 0833   HDL 74 03/31/2019 0833   CHOLHDL 2.7 03/31/2019 0833   CHOLHDL 3 02/18/2016 0928   VLDL 28.2 02/18/2016 0928   LDLCALC 103 (H) 03/31/2019 0833   LDLDIRECT 147.7 12/29/2011 0957    Physical Exam:    VS:  BP (!) 158/76   Pulse 69   Ht 5\' 5"  (1.651 m)   Wt 170 lb 12.8 oz (77.5 kg)   SpO2 97%   BMI 28.42 kg/m     Wt Readings from Last 5 Encounters:  04/28/19 170 lb 12.8 oz (77.5 kg)  03/26/19 170 lb 6.4 oz (77.3 kg)  03/05/19 170 lb (77.1 kg)  08/20/18 165 lb (74.8 kg)  11/26/17 165 lb 9.6 oz (75.1 kg)     Constitutional: No acute distress Eyes: sclera non-icteric, normal conjunctiva and lids ENMT: normal dentition, moist mucous membranes Cardiovascular: regular rhythm, normal rate, no murmurs. S1 and S2 normal. Radial pulses normal bilaterally. No jugular venous distention.  Respiratory: clear to auscultation bilaterally GI : normal bowel sounds, soft and nontender. No distention.   MSK: extremities warm, well perfused. No edema.  NEURO: grossly nonfocal exam, moves all extremities. PSYCH: alert and oriented x 3, normal mood and affect.   ASSESSMENT:    1. Claudication ([place])   2. Coronary artery disease involving native coronary artery of native heart  without angina pectoris   3. Hyperlipidemia, unspecified hyperlipidemia type   4. DOE (dyspnea on exertion)   5. Essential hypertension   6. Family history of early CAD    PLAN:    Claudication-the same risk factor she has for coronary artery disease are risk factors for peripheral arterial disease.  With moderate CAD, there is a possibility we will find peripheral arterial disease as well.  We will perform vascular studies.  Chest pain/dyspnea on exertion-stable symptoms at this time.  Moderate CAD, preserved ejection fraction, grade 1 diastolic dysfunction with indeterminate range E/E prime.  No valvular heart disease noted.  Continue to follow.  Hypertension-suboptimal control today. Was above goal at last visit as well, necessitating further therapy. Will start amlodipine 5 mg daily.  HLD - intolerant to statin. Discussed PCSK9i therapy today, will have her see lipid clinic for initiation.   Moderate CAD - start ASA 81 mg daily.   Total time of encounter: 55 minutes total time of encounter, including 45 minutes spent in face-to-face patient care. This time includes coordination of care and counseling regarding above mentioned problem list. Remainder of non-face-to-face time involved reviewing chart documents/testing relevant to the patient encounter and documentation in the medical record. I have independently reviewed documentation from referring provider.   Cherlynn Kaiser, MD [place]  [facility]    Medication Adjustments/Labs and Tests Ordered: Current medicines are reviewed at length with the patient today.  Concerns regarding medicines are outlined above.  Orders Placed This Encounter  Procedures  . VAS [place] ABI WITH/WO TBI  . VAS [place] LOWER EXTREMITY ARTERIAL DUPLEX   No orders of the defined types were placed in this encounter.   Patient Instructions  Medication Instructions:  NO CHANGES *If you need a refill on your cardiac medications before your next  appointment, please call your pharmacy*  Lab Work: NOT NEEDED  Testing/Procedures: Will be schedule at [place] has requested that you have an ankle brachial index (ABI).  During this test an ultrasound and blood pressure cuff are used to evaluate the arteries that supply the arms and legs with blood. Allow thirty minutes for this exam. There are no restrictions or special instructions. AND Your physician has requested that you have a lower  extremity arterial duplex. This test is an ultrasound of the arteries in the legs. It looks at arterial blood flow in the legs. Allow one hour for Lower Arterial scans. There are no restrictions or special instructions  Follow-Up:  At [facility], you and your health needs are our priority.  As part of our continuing mission to provide you with exceptional heart care, we have created designated Provider Care Teams.  These Care Teams include your primary Cardiologist (physician) and Advanced Practice Providers (APPs -  Physician Assistants and Nurse Practitioners) who all work together to provide you with the care you need, when you need it.  Your next appointment:    4 TO 6 week(s)  The format for your next appointment:   In Person  Provider:   Cherlynn Kaiser, MD  Other Instructions You have been referred to [facility]- discuss possible  Cholesterol injectable

## 2019-05-02 MED ORDER — FENOFIBRATE 160 MG PO TABS: 160.0000 mg | ORAL_TABLET | Freq: Every day | ORAL | 3 refills | Status: DC

## 2019-05-05 ENCOUNTER — Telehealth: Payer: Self-pay | Admitting: *Deleted

## 2019-05-05 NOTE — Telephone Encounter (Signed)
Left message for patient to call back.  Per order patient needs to start  New medication for  Blood pressure  Per Dr Margaretann Loveless  -- Amlodipine 5 mg one tablet daily

## 2019-05-05 NOTE — Telephone Encounter (Signed)
-----   Message from Elouise Munroe, MD sent at 04/29/2019  5:44 AM EST ----- Can you please call the patient and let her know that her blood pressure readings have been high in the last two office visits. We did not have an opportunity to discuss this at length at our visit, but I would like her to start amlodipine 5 mg daily and let us know how she's doing at follow up.   Thanks, GA

## 2019-05-06 ENCOUNTER — Other Ambulatory Visit: Payer: Self-pay | Admitting: Internal Medicine

## 2019-05-06 DIAGNOSIS — I739 Peripheral vascular disease, unspecified: Secondary | ICD-10-CM

## 2019-05-07 ENCOUNTER — Telehealth (INDEPENDENT_AMBULATORY_CARE_PROVIDER_SITE_OTHER): Payer: Medicare PPO | Admitting: Family Medicine

## 2019-05-07 ENCOUNTER — Other Ambulatory Visit: Payer: Self-pay

## 2019-05-07 DIAGNOSIS — R0989 Other specified symptoms and signs involving the circulatory and respiratory systems: Secondary | ICD-10-CM | POA: Diagnosis not present

## 2019-05-07 NOTE — Progress Notes (Signed)
Virtual Visit via Video Note  I connected with the patient on 05/07/19 at  9:30 AM EST by a video enabled telemedicine application and verified that I am speaking with the correct person using two identifiers.  Location patient: home Location provider:work or home office Persons participating in the virtual visit: patient, provider  I discussed the limitations of evaluation and management by telemedicine and the availability of in person appointments. The patient expressed understanding and agreed to proceed.   HPI: Here for 3 weeks of an intermittent sensation of a lump in the throat. This is not painful. It does not interfere with swallowing or speaking. There is no ST or cough. It is never present when she wakes up in the morning, but it will appear during the day. She is a former smoker (she quit 10 and 1/2 years ago). She has GERD but this is well controlled by taking Nexium daily. She has been under a lot of stress lately, as she undergoes testing for CAD and vascular disease. She is seeing Dr. Margaretann Loveless in Cardiology for workup of chest pressure, SOB, and calf pain during exercise. She sleeps well. She has been on Wellbutrin and Zoloft for several years.   ROS: See pertinent positives and negatives per HPI.  Past Medical History:  Diagnosis Date  . Allergy   . Arthritis   . Asthma   . Cancer ([place])    hx skin cancer  . Depression   . Detrusor instability   . Diverticulosis    not symptomatic  . GERD (gastroesophageal reflux disease)   . History of kidney stones   . History of pancreatitis    April 2015  . History of skin cancer   . Hyperlipidemia   . Hypertension   . Osteopenia   . Urinary incontinence   . Vitamin D deficiency    RESOLVED     Past Surgical History:  Procedure Laterality Date  . ABDOMINAL HYSTERECTOMY  1994   TAH/ BLADDER SUSPENSION  . BREAST BIOPSY  1972   benign  . CHOLECYSTECTOMY N/A 07/12/2013   Procedure: LAPAROSCOPIC CHOLECYSTECTOMY WITH  INTRAOPERATIVE CHOLANGIOGRAM;  Surgeon: Edward Jolly, MD;  Location: [facility];  Service: General;  Laterality: N/A;  . COLONOSCOPY  06-24-12   per Dr. Earlean Shawl, benign polyps, repeat in 5 yrs    . ERCP N/A 07/14/2013   Procedure: ENDOSCOPIC RETROGRADE CHOLANGIOPANCREATOGRAPHY (ERCP);  Surgeon: Gatha Mayer, MD;  Location: [facility];  Service: Endoscopy;  Laterality: N/A;  . TONSILLECTOMY    . TOTAL HIP ARTHROPLASTY Right 01/13/2015   Procedure: RIGHT TOTAL HIP ARTHROPLASTY ANTERIOR APPROACH;  Surgeon: Gaynelle Arabian, MD;  Location[facility];  Service: Orthopedics;  Laterality: Right;  . TOTAL HIP ARTHROPLASTY Left 11/07/2017   Procedure: LEFT TOTAL HIP ARTHROPLASTY ANTERIOR APPROACH;  Surgeon: Gaynelle Arabian, MD;  [facility];  Service: Orthopedics;  Laterality: Left;  . TUBAL LIGATION      Family History  Problem Relation Age of Onset  . Hypertension Mother   . Osteoporosis Mother   . Heart disease Father   . Osteoporosis Sister   . Breast cancer Paternal Aunt        Age 38's  . Diabetes Paternal Uncle      Current Outpatient Medications:  .  buPROPion (WELLBUTRIN XL) 300 MG 24 hr tablet, Take 300 mg by mouth daily.  , Disp: , Rfl:  .  Cholecalciferol (VITAMIN D PO), Take 5,000 Units by mouth daily. Reported on 06/09/2015, Disp: , Rfl:  .  esomeprazole (NEXIUM) 20 MG capsule, Take 20 mg by mouth daily. , Disp: , Rfl:  .  ezetimibe (ZETIA) 10 MG tablet, Take 1 tablet (10 mg total) by mouth daily. Please schedule a physical for more refills. thanks, Disp: 90 tablet, Rfl: 0 .  fenofibrate 160 MG tablet, Take 1 tablet (160 mg total) by mouth daily., Disp: 90 tablet, Rfl: 3 .  metoprolol succinate (TOPROL-XL) 25 MG 24 hr tablet, TAKE 1 TABLET BY MOUTH EVERY DAY, Disp: 90 tablet, Rfl: 0 .  MYRBETRIQ 50 MG TB24 tablet, Take 50 mg by mouth daily., Disp: , Rfl: 11 .  sertraline (ZOLOFT) 100 MG tablet, Take 200 mg by mouth daily. , Disp: , Rfl:  .  valsartan (DIOVAN) 80 MG tablet, Take  1 tablet (80 mg total) by mouth daily., Disp: 90 tablet, Rfl: 3  EXAM:  VITALS per patient if applicable:  GENERAL: alert, oriented, appears well and in no acute distress  HEENT: atraumatic, conjunttiva clear, no obvious abnormalities on inspection of external nose and ears  NECK: normal movements of the head and neck  LUNGS: on inspection no signs of respiratory distress, breathing rate appears normal, no obvious gross SOB, gasping or wheezing  CV: no obvious cyanosis  MS: moves all visible extremities without noticeable abnormality  PSYCH/NEURO: pleasant and cooperative, no obvious depression or anxiety, speech and thought processing grossly intact  ASSESSMENT AND PLAN: Globus sensation, likely due to stress. We discussed the nature of this phenomenon, and she will observe this for the next 2 weeks. If this is still present at that time, she will follow up with [place].  [name], MD  Discussed the following assessment and plan:  No diagnosis found.     I discussed the assessment and treatment plan with the patient. The patient was provided an opportunity to ask questions and all were answered. The patient agreed with the plan and demonstrated an understanding of the instructions.   The patient was advised to call back or seek an in-person evaluation if the symptoms worsen or if the condition fails to improve as anticipated.

## 2019-05-13 ENCOUNTER — Other Ambulatory Visit: Payer: Self-pay

## 2019-05-13 ENCOUNTER — Ambulatory Visit (HOSPITAL_COMMUNITY)
Admission: RE | Admit: 2019-05-13 | Discharge: 2019-05-13 | Disposition: A | Discharge status: Home / Self Care | Payer: Medicare PPO | Source: Ambulatory Visit | Attending: Cardiology | Admitting: Cardiology

## 2019-05-13 DIAGNOSIS — I2510 Atherosclerotic heart disease of native coronary artery without angina pectoris: Secondary | ICD-10-CM | POA: Diagnosis not present

## 2019-05-13 DIAGNOSIS — I739 Peripheral vascular disease, unspecified: Secondary | ICD-10-CM | POA: Diagnosis not present

## 2019-05-20 ENCOUNTER — Ambulatory Visit (INDEPENDENT_AMBULATORY_CARE_PROVIDER_SITE_OTHER): Payer: Medicare PPO | Admitting: Pharmacist Clinician (PhC)/ Clinical Pharmacy Specialist

## 2019-05-20 ENCOUNTER — Other Ambulatory Visit: Payer: Self-pay

## 2019-05-20 VITALS — Ht 65.0 in | Wt 171.8 lb

## 2019-05-20 DIAGNOSIS — E785 Hyperlipidemia, unspecified: Secondary | ICD-10-CM | POA: Diagnosis not present

## 2019-05-20 DIAGNOSIS — G720 Drug-induced myopathy: Secondary | ICD-10-CM

## 2019-05-20 DIAGNOSIS — T466X5A Adverse effect of antihyperlipidemic and antiarteriosclerotic drugs, initial encounter: Secondary | ICD-10-CM | POA: Diagnosis not present

## 2019-05-20 NOTE — Patient Instructions (Addendum)
Your Results:             Your most recent labs Goal  Total Cholesterol 197 < 200  Triglycerides 115 < 150  HDL (happy/good cholesterol) 74 > 40  LDL (lousy/bad cholesterol 103 < 70      Medication changes:  We will start the paperwork to get Repatha SureClick [id number] mg approved by your insurance company.  You will get a call from our assistant Haleigh once its approved.  Lab orders:  Repeat labs after 3 months on medication.    Patient Assistance:  The [employer] foundation offers assistance to help pay for medication copays.  They will cover copays for all cholesterol lowering meds, including statins, fibrates, omega-3 oils, ezetimibe, Repatha, Praluent, Nexletol, Nexlizet.  The cards are usually good for $2,500 or 12 months, whichever comes first. 1. Go to healthwellfoundation.org 2. Click on "Apply Now" 3. Answer questions as to whom is applying (patient or representative) 4. Your disease fund will be "hypercholesterolemia - Medicare access" 5. They will ask questions about finances and which medications you are taking for cholesterol 6. When you submit, the approval is usually within minutes.  You will need to print the card information from the site 7. You will need to show this information to your pharmacy, they will bill your Medicare Part D plan first -then bill [employer] Well --for the copay.   You can also call them at [phone], although the hold times can be quite long.   Thank you for choosing [facility]

## 2019-05-20 NOTE — Progress Notes (Signed)
05/22/2019 AMARIONA TOUSLEY [birth year] [id number]   HPI:  Maria Schneider is a 74 y.o. female patient of Dr Margaretann Loveless, who presents today for a lipid clinic evaluation.  See pertinent past medical history below.  She was last seen by Dr. Margaretann Loveless on Feb, 8, when they reviewed her recent echo and coronary CT angiogram.    Past Medical History: hypertension Last BP 158/76, on valsartan 80 mg qd, just started amlodipine 5 mg qd  ASCVD CT coronary FFR showed significant stenosis in mid LCx  claudication C/o legs cramping easily with exercise -   depression currently on bupropion 300 mg and sertraline 200 mg qd  GERD Daily esomeprazole   Current Medications: ezetimibe 10, fenofibrate 160   Cholesterol Goals: LDL < 70   Intolerant/previously tried: ACEI - cough, simvastatin - myalgia, atorvastatin - myalgias  Family history: father died at 11, first MI at 29, CABG x 2 at 28 (died in 9); brother (64 yrs younger) has CABG x 5 at 63 Mother died from CHF at 32 MI; 1 son, monitors closely due to family history  Diet: not a big meat eater, lots of chicken, fish and vegetables; admits to having a sweet tooth - chocholate and soda are downfalls  Exercise:  Walking on treadmill up to 30 min/day in 15 min increments  Labs: 1/21:  TC 197, TG 115, HDL 74, LDL 103 (on ezetimibe 10, fenofibrate 160)   Current Outpatient Medications  Medication Sig Dispense Refill  . buPROPion (WELLBUTRIN XL) 300 MG 24 hr tablet Take 300 mg by mouth daily.      . Cholecalciferol (VITAMIN D PO) Take 5,000 Units by mouth daily. Reported on 06/09/2015    . esomeprazole (NEXIUM) 20 MG capsule Take 20 mg by mouth daily.     [name] ezetimibe (ZETIA) 10 MG tablet TAKE 1 TABLET BY MOUTH EVERY DAY 90 tablet 3  . fenofibrate 160 MG tablet Take 1 tablet (160 mg total) by mouth daily. 90 tablet 3  . metoprolol succinate (TOPROL-XL) 25 MG 24 hr tablet TAKE 1 TABLET BY MOUTH EVERY DAY 90 tablet 0  . MYRBETRIQ 50 MG TB24 tablet Take 50 mg by  mouth daily.  11  . sertraline (ZOLOFT) 100 MG tablet Take 200 mg by mouth daily.     . valsartan (DIOVAN) 80 MG tablet Take 1 tablet (80 mg total) by mouth daily. 90 tablet 3   No current facility-administered medications for this visit.    Allergies  Allergen Reactions  . Ace Inhibitors     REACTION: cough  . Codeine Nausea And Vomiting  . Fentanyl Itching  . Statins     REACTION: MYALGIAS-muscle pain    Past Medical History:  Diagnosis Date  . Allergy   . Arthritis   . Asthma   . Cancer ([place])    hx skin cancer  . Depression   . Detrusor instability   . Diverticulosis    not symptomatic  . GERD (gastroesophageal reflux disease)   . History of kidney stones   . History of pancreatitis    April 2015  . History of skin cancer   . Hyperlipidemia   . Hypertension   . Osteopenia   . Urinary incontinence   . Vitamin D deficiency    RESOLVED     Height 5\' 5"  (1.651 m), weight 171 lb 12.8 oz (77.9 kg).   HYPERLIPIDEMIA Patient with hyperlipidemia and ASCVD, unable to tolerate multiple statin drugs.  Unable to reach LDL goals  with just ezetimibe.  Will start PA process to get approval for Repatha 140 mg.  Patient was educated in mechanism of action, side effects and expected benefit.  All questions were answered.  She will need to repeat labs after 5-6 doses.  She was also given information on [place] to help with any cost issues.     Tommy Medal PharmD CPP [facility] [address] [place], [place] [address] [phone]

## 2019-05-21 ENCOUNTER — Other Ambulatory Visit: Payer: Self-pay | Admitting: Family Medicine

## 2019-05-21 DIAGNOSIS — F411 Generalized anxiety disorder: Secondary | ICD-10-CM | POA: Diagnosis not present

## 2019-05-21 DIAGNOSIS — F331 Major depressive disorder, recurrent, moderate: Secondary | ICD-10-CM | POA: Diagnosis not present

## 2019-05-22 ENCOUNTER — Encounter: Payer: Self-pay | Admitting: Pharmacist Clinician (PhC)/ Clinical Pharmacy Specialist

## 2019-05-22 ENCOUNTER — Telehealth: Payer: Self-pay

## 2019-05-22 MED ORDER — REPATHA SURECLICK 140 MG/ML ~~LOC~~ SOAJ: 140.0000 mg | SUBCUTANEOUS | 11 refills | Status: DC

## 2019-05-22 NOTE — Telephone Encounter (Signed)
Called and lmomed the pt regarding the approval of the repatha. rx sent, instructed the pt to call to determine price and to call back if unaffordable.

## 2019-05-22 NOTE — Assessment & Plan Note (Signed)
Patient with hyperlipidemia and ASCVD, unable to tolerate multiple statin drugs.  Unable to reach LDL goals with just ezetimibe.  Will start PA process to get approval for Repatha 140 mg.  Patient was educated in mechanism of action, side effects and expected benefit.  All questions were answered.  She will need to repeat labs after 5-6 doses.  She was also given information on [place] to help with any cost issues.

## 2019-05-23 ENCOUNTER — Other Ambulatory Visit: Payer: Self-pay | Admitting: Family Medicine

## 2019-05-28 ENCOUNTER — Ambulatory Visit: Payer: Medicare PPO | Admitting: Internal Medicine

## 2019-05-28 ENCOUNTER — Other Ambulatory Visit: Payer: Self-pay

## 2019-05-28 ENCOUNTER — Encounter: Payer: Self-pay | Admitting: Internal Medicine

## 2019-05-28 VITALS — BP 130/78 | HR 72 | Temp 97.3°F | Ht 65.0 in | Wt 170.0 lb

## 2019-05-28 DIAGNOSIS — T466X5A Adverse effect of antihyperlipidemic and antiarteriosclerotic drugs, initial encounter: Secondary | ICD-10-CM | POA: Diagnosis not present

## 2019-05-28 DIAGNOSIS — R079 Chest pain, unspecified: Secondary | ICD-10-CM | POA: Diagnosis not present

## 2019-05-28 DIAGNOSIS — I739 Peripheral vascular disease, unspecified: Secondary | ICD-10-CM | POA: Diagnosis not present

## 2019-05-28 DIAGNOSIS — E785 Hyperlipidemia, unspecified: Secondary | ICD-10-CM

## 2019-05-28 DIAGNOSIS — I10 Essential (primary) hypertension: Secondary | ICD-10-CM

## 2019-05-28 DIAGNOSIS — R0609 Other forms of dyspnea: Secondary | ICD-10-CM

## 2019-05-28 DIAGNOSIS — I2510 Atherosclerotic heart disease of native coronary artery without angina pectoris: Secondary | ICD-10-CM | POA: Diagnosis not present

## 2019-05-28 DIAGNOSIS — G720 Drug-induced myopathy: Secondary | ICD-10-CM

## 2019-05-28 DIAGNOSIS — Z8249 Family history of ischemic heart disease and other diseases of the circulatory system: Secondary | ICD-10-CM

## 2019-05-28 DIAGNOSIS — R0600 Dyspnea, unspecified: Secondary | ICD-10-CM | POA: Diagnosis not present

## 2019-05-28 NOTE — Patient Instructions (Addendum)
Medication Instructions:  Continue same medications *If you need a refill on your cardiac medications before your next appointment, please call your pharmacy*   Lab Work: None ordered    Testing/Procedures: None ordered   Follow-Up: At [facility], you and your health needs are our priority.  As part of our continuing mission to provide you with exceptional heart care, we have created designated Provider Care Teams.  These Care Teams include your primary Cardiologist (physician) and Advanced Practice Providers (APPs -  Physician Assistants and Nurse Practitioners) who all work together to provide you with the care you need, when you need it.  We recommend signing up for the patient portal called "MyChart".  Sign up information is provided on this After Visit Summary.  MyChart is used to connect with patients for Virtual Visits (Telemedicine).  Patients are able to view lab/test results, encounter notes, upcoming appointments, etc.  Non-urgent messages can be sent to your provider as well.   To learn more about what you can do with MyChart, go to [email or web address].    Your next appointment:  3 Months  Tuesday 09/09/19 at 8:00 am   The format for your next appointment: Virtual    Provider: Dr.Acharya

## 2019-05-28 NOTE — Progress Notes (Signed)
Cardiology Office Note:    Date:  05/28/2019   ID:  YALENA BOKER, DOB 06/30/1945, MRN [id number]  PCP:  Laurey Morale, MD  Cardiologist:  No primary care provider on file.  Electrophysiologist:  None   Referring MD: Laurey Morale, MD   Chief Complaint: LE pain, chest pain  History of Present Illness:    Maria Schneider is a 74 y.o. female with a history of asthma, depression, GERD, HTN, HLD, who presents today for follow up of chest pressure with exertion and DOE.  Presents today for review of cardiovascular risk factor modification and review of LE vascular studies.   Reviewed normal LE vascular studies in great detail. Discussed optimal exercise, walking program, and cardiovascular recommendations for exercise. We reviewed dietary options in detail including Mediterranean diet. I answered all questions to the best of my ability.   She denies significant chest pain, SOB, syncope, dizziness.   Past Medical History:  Diagnosis Date  . Allergy   . Arthritis   . Asthma   . Cancer ([place])    hx skin cancer  . Depression   . Detrusor instability   . Diverticulosis    not symptomatic  . GERD (gastroesophageal reflux disease)   . History of kidney stones   . History of pancreatitis    April 2015  . History of skin cancer   . Hyperlipidemia   . Hypertension   . Osteopenia   . Urinary incontinence   . Vitamin D deficiency    RESOLVED     Past Surgical History:  Procedure Laterality Date  . ABDOMINAL HYSTERECTOMY  1994   TAH/ BLADDER SUSPENSION  . BREAST BIOPSY  1972   benign  . CHOLECYSTECTOMY N/A 07/12/2013   Procedure: LAPAROSCOPIC CHOLECYSTECTOMY WITH INTRAOPERATIVE CHOLANGIOGRAM;  Surgeon: Edward Jolly, MD;  Location: [facility];  Service: General;  Laterality: N/A;  . COLONOSCOPY  06-24-12   per Dr. Earlean Shawl, benign polyps, repeat in 5 yrs    . ERCP N/A 07/14/2013   Procedure: ENDOSCOPIC RETROGRADE CHOLANGIOPANCREATOGRAPHY (ERCP);  Surgeon: Gatha Mayer, MD;   Location: [facility];  Service: Endoscopy;  Laterality: N/A;  . TONSILLECTOMY    . TOTAL HIP ARTHROPLASTY Right 01/13/2015   Procedure: RIGHT TOTAL HIP ARTHROPLASTY ANTERIOR APPROACH;  Surgeon: Gaynelle Arabian, MD;  Location: [facility];  Service: Orthopedics;  Laterality: Right;  . TOTAL HIP ARTHROPLASTY Left 11/07/2017   Procedure: LEFT TOTAL HIP ARTHROPLASTY ANTERIOR APPROACH;  Surgeon: Gaynelle Arabian, MD;  [facility];  Service: Orthopedics;  Laterality: Left;  . TUBAL LIGATION      Current Medications: Current Meds  Medication Sig  . buPROPion (WELLBUTRIN XL) 300 MG 24 hr tablet Take 300 mg by mouth daily.    . Cholecalciferol (VITAMIN D PO) Take 5,000 Units by mouth daily. Reported on 06/09/2015  . esomeprazole (NEXIUM) 20 MG capsule Take 20 mg by mouth daily.   . Evolocumab (REPATHA SURECLICK) XX123456 MG/ML SOAJ Inject 140 mg into the skin every 14 (fourteen) days.  [name] ezetimibe (ZETIA) 10 MG tablet TAKE 1 TABLET BY MOUTH EVERY DAY  . fenofibrate 160 MG tablet Take 1 tablet (160 mg total) by mouth daily.  . metoprolol succinate (TOPROL-XL) 25 MG 24 hr tablet TAKE 1 TABLET BY MOUTH EVERY DAY  . MYRBETRIQ 50 MG TB24 tablet Take 50 mg by mouth daily.  . sertraline (ZOLOFT) 100 MG tablet Take 200 mg by mouth daily.   . valsartan (DIOVAN) 80 MG tablet Take 1  tablet (80 mg total) by mouth daily.     Allergies:   Ace inhibitors, Codeine, Fentanyl, and Statins   Social History   Socioeconomic History  . Marital status: Married    Spouse name: Not on file  . Number of children: 1  . Years of education: 54  . Highest education level: Not on file  Occupational History  . Occupation: retired  Tobacco Use  . Smoking status: Former Smoker    Years: 42.00    Types: Cigarettes    Quit date: 07/09/2008    Years since quitting: 10.8  . Smokeless tobacco: Never Used  . Tobacco comment: 9 years ago  Substance and Sexual Activity  . Alcohol use: Yes    Alcohol/week: 2.0 standard drinks     Types: 2 Glasses of wine per week    Comment: OCC  . Drug use: No  . Sexual activity: Never    Birth control/protection: Surgical  Other Topics Concern  . Not on file  Social History Narrative   Gets regular exercise   Right handed   Two story home   No caffeine      Social Determinants of Health   Financial Resource Strain:   . Difficulty of Paying Living Expenses: Not on file  Food Insecurity:   . Worried About Charity fundraiser in the Last Year: Not on file  . Ran Out of Food in the Last Year: Not on file  Transportation Needs:   . Lack of Transportation (Medical): Not on file  . Lack of Transportation (Non-Medical): Not on file  Physical Activity:   . Days of Exercise per Week: Not on file  . Minutes of Exercise per Session: Not on file  Stress:   . Feeling of Stress : Not on file  Social Connections:   . Frequency of Communication with Friends and Family: Not on file  . Frequency of Social Gatherings with Friends and Family: Not on file  . Attends Religious Services: Not on file  . Active Member of Clubs or Organizations: Not on file  . Attends Archivist Meetings: Not on file  . Marital Status: Not on file     Family History: The patient's family history includes Breast cancer in her paternal aunt; Diabetes in her paternal uncle; Heart disease in her father; Hypertension in her mother; Osteoporosis in her mother and sister.  ROS:   Please see the history of present illness.    All other systems reviewed and are negative.  EKGs/Labs/Other Studies Reviewed:    The following studies were reviewed today:  EKG:  n/a  Recent Labs: 08/20/2018: ALT 17 04/15/2019: BUN 21; Creatinine, Ser 1.17; Hemoglobin 11.6; Platelets 193; Potassium 4.5; Sodium 141  Recent Lipid Panel    Component Value Date/Time   CHOL 197 03/31/2019 0833   TRIG 115 03/31/2019 0833   HDL 74 03/31/2019 0833   CHOLHDL 2.7 03/31/2019 0833   CHOLHDL 3 02/18/2016 0928   VLDL 28.2  02/18/2016 0928   LDLCALC 103 (H) 03/31/2019 0833   LDLDIRECT 147.7 12/29/2011 0957    Physical Exam:    VS:  BP 130/78   Pulse 72   Temp (!) 97.3 F (36.3 C)   Ht 5\' 5"  (1.651 m)   Wt 170 lb (77.1 kg)   SpO2 95%   BMI 28.29 kg/m     Wt Readings from Last 5 Encounters:  05/28/19 170 lb (77.1 kg)  05/20/19 171 lb 12.8 oz (77.9 kg)  04/28/19 170 lb 12.8 oz (77.5 kg)  03/26/19 170 lb 6.4 oz (77.3 kg)  03/05/19 170 lb (77.1 kg)     Constitutional: No acute distress Eyes: sclera non-icteric, normal conjunctiva and lids ENMT: normal dentition, moist mucous membranes Cardiovascular: regular rhythm, normal rate, no murmurs. S1 and S2 normal. Radial pulses normal bilaterally. No jugular venous distention.  Respiratory: clear to auscultation bilaterally GI : normal bowel sounds, soft and nontender. No distention.   MSK: extremities warm, well perfused. No edema.  NEURO: grossly nonfocal exam, moves all extremities. PSYCH: alert and oriented x 3, normal mood and affect.   ASSESSMENT:    1. Claudication ([place])   2. Hyperlipidemia, unspecified hyperlipidemia type   3. Statin myopathy   4. Coronary artery disease involving native coronary artery of native heart without angina pectoris   5. DOE (dyspnea on exertion)   6. Essential hypertension   7. Family history of early CAD   81. Chest pain, unspecified type    PLAN:    Claudication [facility]) - vascular studies normal. Discussed walking program and follow up with PCP.   Hyperlipidemia, unspecified hyperlipidemia type Statin myopathy In the process of obtaining Repatha. Discussed in detail.   Coronary artery disease involving native coronary artery of native heart without angina pectoris DOE (dyspnea on exertion)  Family history of early CAD Chest pain, unspecified type  - moderate nonobstructive CAD by CCTA. Discussed aggressive risk factor modification.  - continue metoprolol  Essential hypertension - continue metoprolol  and valsartan.    Total time of encounter: 45 minutes total time of encounter, including 40 minutes spent in face-to-face patient care. This time includes coordination of care and counseling regarding above mentioned problem list. Remainder of non-face-to-face time involved reviewing chart documents/testing relevant to the patient encounter and documentation in the medical record. I have independently reviewed documentation from referring provider.   Cherlynn Kaiser, MD   [facility]    Medication Adjustments/Labs and Tests Ordered: Current medicines are reviewed at length with the patient today.  Concerns regarding medicines are outlined above.  No orders of the defined types were placed in this encounter.  No orders of the defined types were placed in this encounter.   Patient Instructions  Medication Instructions:  Continue same medications *If you need a refill on your cardiac medications before your next appointment, please call your pharmacy*   Lab Work: None ordered    Testing/Procedures: None ordered   Follow-Up: At [facility], you and your health needs are our priority.  As part of our continuing mission to provide you with exceptional heart care, we have created designated Provider Care Teams.  These Care Teams include your primary Cardiologist (physician) and Advanced Practice Providers (APPs -  Physician Assistants and Nurse Practitioners) who all work together to provide you with the care you need, when you need it.  We recommend signing up for the patient portal called "MyChart".  Sign up information is provided on this After Visit Summary.  MyChart is used to connect with patients for Virtual Visits (Telemedicine).  Patients are able to view lab/test results, encounter notes, upcoming appointments, etc.  Non-urgent messages can be sent to your provider as well.   To learn more about what you can do with MyChart, go to [email or web address].     Your next appointment:  3 Months  Tuesday 09/09/19 at 8:00 am   The format for your next appointment: Virtual    Provider: Dr.Aubre Quincy

## 2019-06-28 ENCOUNTER — Other Ambulatory Visit: Payer: Self-pay | Admitting: Family Medicine

## 2019-08-27 DIAGNOSIS — F331 Major depressive disorder, recurrent, moderate: Secondary | ICD-10-CM | POA: Diagnosis not present

## 2019-08-27 DIAGNOSIS — F411 Generalized anxiety disorder: Secondary | ICD-10-CM | POA: Diagnosis not present

## 2019-09-09 ENCOUNTER — Telehealth: Payer: Self-pay | Admitting: *Deleted

## 2019-09-09 ENCOUNTER — Encounter: Payer: Self-pay | Admitting: Internal Medicine

## 2019-09-09 ENCOUNTER — Telehealth (INDEPENDENT_AMBULATORY_CARE_PROVIDER_SITE_OTHER): Payer: Medicare PPO | Admitting: Internal Medicine

## 2019-09-09 VITALS — BP 121/86 | HR 73 | Ht 65.0 in | Wt 167.0 lb

## 2019-09-09 DIAGNOSIS — E785 Hyperlipidemia, unspecified: Secondary | ICD-10-CM

## 2019-09-09 DIAGNOSIS — T466X5D Adverse effect of antihyperlipidemic and antiarteriosclerotic drugs, subsequent encounter: Secondary | ICD-10-CM

## 2019-09-09 DIAGNOSIS — I10 Essential (primary) hypertension: Secondary | ICD-10-CM

## 2019-09-09 DIAGNOSIS — R0609 Other forms of dyspnea: Secondary | ICD-10-CM

## 2019-09-09 DIAGNOSIS — G720 Drug-induced myopathy: Secondary | ICD-10-CM | POA: Diagnosis not present

## 2019-09-09 DIAGNOSIS — I739 Peripheral vascular disease, unspecified: Secondary | ICD-10-CM | POA: Diagnosis not present

## 2019-09-09 DIAGNOSIS — I2510 Atherosclerotic heart disease of native coronary artery without angina pectoris: Secondary | ICD-10-CM

## 2019-09-09 DIAGNOSIS — Z8249 Family history of ischemic heart disease and other diseases of the circulatory system: Secondary | ICD-10-CM

## 2019-09-09 DIAGNOSIS — T466X5A Adverse effect of antihyperlipidemic and antiarteriosclerotic drugs, initial encounter: Secondary | ICD-10-CM

## 2019-09-09 DIAGNOSIS — R0600 Dyspnea, unspecified: Secondary | ICD-10-CM | POA: Diagnosis not present

## 2019-09-09 NOTE — Telephone Encounter (Signed)
RN spoke to patient. Instruction were given  from today's virtual visit 09/09/19.  AVS SUMMARY has been sent by mychart .  Lab - lipid ordered  appointment schedule for Dec 15 ,2021 at 10:20 am   Patient verbalized understanding

## 2019-09-09 NOTE — Patient Instructions (Addendum)
  Medication Instructions:   Injection site pain - may be due to less fat tissue on the leg. Try injections  in your abdomen, and if you have continued pain please let us know.   *If you need a refill on your cardiac medications before your next appointment, please call your pharmacy*   Lab Work: LIPID  If you have labs (blood work) drawn today and your tests are completely normal, you will receive your results only by: [name] MyChart Message (if you have MyChart) OR . A paper copy in the mail If you have any lab test that is abnormal or we need to change your treatment, we will call you to review the results.   Testing/Procedures: NOT NEEDED   Follow-Up: At [facility], you and your health needs are our priority.  As part of our continuing mission to provide you with exceptional heart care, we have created designated Provider Care Teams.  These Care Teams include your primary Cardiologist (physician) and Advanced Practice Providers (APPs -  Physician Assistants and Nurse Practitioners) who all work together to provide you with the care you need, when you need it.      Your next appointment:   6 month(s)  The format for your next appointment:   In Person  Provider:   Cherlynn Kaiser, MD

## 2019-09-09 NOTE — Progress Notes (Signed)
Virtual Visit via Telephone Note   This visit type was conducted due to national recommendations for restrictions regarding the COVID-19 Pandemic (e.g. social distancing) in an effort to limit this patient's exposure and mitigate transmission in our community.  Due to her co-morbid illnesses, this patient is at least at moderate risk for complications without adequate follow up.  This format is felt to be most appropriate for this patient at this time.  The patient did not have access to video technology/had technical difficulties with video requiring transitioning to audio format only (telephone).  All issues noted in this document were discussed and addressed.  No physical exam could be performed with this format.  Please refer to the patient's chart for her  consent to telehealth for [facility].   The patient was identified using 2 identifiers.  Date:  09/09/2019   ID:  Maria Schneider, DOB December 01, 1945, MRN [id number]  Patient Location: Home Provider Location: Office  PCP:  Laurey Morale, MD  Cardiologist:  No primary care provider on file.  Electrophysiologist:  None   Evaluation Performed:  Follow-Up Visit  Chief Complaint:    History of Present Illness:    Maria Schneider is a 74 y.o. female with asthma, depression, GERD, HTN, HLD, who presents today for follow up of chest pressure with exertion and DOE.  Presents today for review of cardiovascular risk factor modification.   She is on PCSK9I. She is having injection site pain. We reviewed this with pharmacist and recommended injecting into site with adequate adipose tissue. She continues on Zetia, fibrate and Repatha.   She is doing well. She is walking frequently without significant leg discomfort. She is eating a heart healthy diet.   The patient denies chest pain, chest pressure, dyspnea at rest or with exertion, palpitations, PND, orthopnea, or leg swelling. Denies cough, fever, chills. Denies nausea, vomiting. Denies  syncope or presyncope. Denies dizziness or lightheadedness.  The patient does not have symptoms concerning for COVID-19 infection (fever, chills, cough, or new shortness of breath).    Past Medical History:  Diagnosis Date  . Allergy   . Arthritis   . Asthma   . Cancer ([place])    hx skin cancer  . Depression   . Detrusor instability   . Diverticulosis    not symptomatic  . GERD (gastroesophageal reflux disease)   . History of kidney stones   . History of pancreatitis    April 2015  . History of skin cancer   . Hyperlipidemia   . Hypertension   . Osteopenia   . Urinary incontinence   . Vitamin D deficiency    RESOLVED    Past Surgical History:  Procedure Laterality Date  . ABDOMINAL HYSTERECTOMY  1994   TAH/ BLADDER SUSPENSION  . BREAST BIOPSY  1972   benign  . CHOLECYSTECTOMY N/A 07/12/2013   Procedure: LAPAROSCOPIC CHOLECYSTECTOMY WITH INTRAOPERATIVE CHOLANGIOGRAM;  Surgeon: Edward Jolly, MD;  Location: [facility];  Service: General;  Laterality: N/A;  . COLONOSCOPY  06-24-12   per Dr. Earlean Shawl, benign polyps, repeat in 5 yrs    . ERCP N/A 07/14/2013   Procedure: ENDOSCOPIC RETROGRADE CHOLANGIOPANCREATOGRAPHY (ERCP);  Surgeon: Gatha Mayer, MD;  Location: [facility];  Service: Endoscopy;  Laterality: N/A;  . TONSILLECTOMY    . TOTAL HIP ARTHROPLASTY Right 01/13/2015   Procedure: RIGHT TOTAL HIP ARTHROPLASTY ANTERIOR APPROACH;  Surgeon: Gaynelle Arabian, MD;  [facility]: [facility];  Service: Orthopedics;  Laterality: Right;  . TOTAL  HIP ARTHROPLASTY Left 11/07/2017   Procedure: LEFT TOTAL HIP ARTHROPLASTY ANTERIOR APPROACH;  Surgeon: Gaynelle Arabian, MD;  [facility];  Service: Orthopedics;  Laterality: Left;  . TUBAL LIGATION       Current Meds  Medication Sig  . buPROPion (WELLBUTRIN XL) 300 MG 24 hr tablet Take 300 mg by mouth daily.    . Cholecalciferol (VITAMIN D3) 25 MCG (1000 UT) CAPS Take 2 capsules by mouth daily.  [name] esomeprazole (NEXIUM) 20 MG capsule Take 20  mg by mouth daily.   . Evolocumab (REPATHA SURECLICK) 027 MG/ML SOAJ Inject 140 mg into the skin every 14 (fourteen) days.  [name] ezetimibe (ZETIA) 10 MG tablet TAKE 1 TABLET BY MOUTH EVERY DAY  . fenofibrate 160 MG tablet Take 1 tablet (160 mg total) by mouth daily.  . metoprolol succinate (TOPROL-XL) 25 MG 24 hr tablet TAKE 1 TABLET BY MOUTH EVERY DAY  . MYRBETRIQ 50 MG TB24 tablet Take 50 mg by mouth daily.  . sertraline (ZOLOFT) 100 MG tablet Take 200 mg by mouth daily.   . valsartan (DIOVAN) 80 MG tablet TAKE 1 TABLET BY MOUTH EVERY DAY     Allergies:   Ace inhibitors, Codeine, Fentanyl, and Statins   Social History   Tobacco Use  . Smoking status: Former Smoker    Years: 42.00    Types: Cigarettes    Quit date: 07/09/2008    Years since quitting: 11.1  . Smokeless tobacco: Never Used  . Tobacco comment: 9 years ago  Vaping Use  . Vaping Use: Never used  Substance Use Topics  . Alcohol use: Yes    Alcohol/week: 2.0 standard drinks    Types: 2 Glasses of wine per week    Comment: OCC  . Drug use: No     Family Hx: The patient's family history includes Breast cancer in her paternal aunt; Diabetes in her paternal uncle; Heart disease in her father; Hypertension in her mother; Osteoporosis in her mother and sister.  ROS:   Please see the history of present illness.     All other systems reviewed and are negative.   Prior CV studies:   The following studies were reviewed today:    Labs/Other Tests and Data Reviewed:    EKG:  No ECG reviewed.  Recent Labs: 04/15/2019: BUN 21; Creatinine, Ser 1.17; Hemoglobin 11.6; Platelets 193; Potassium 4.5; Sodium 141   Recent Lipid Panel Lab Results  Component Value Date/Time   CHOL 197 03/31/2019 08:33 AM   TRIG 115 03/31/2019 08:33 AM   HDL 74 03/31/2019 08:33 AM   CHOLHDL 2.7 03/31/2019 08:33 AM   CHOLHDL 3 02/18/2016 09:28 AM   LDLCALC 103 (H) 03/31/2019 08:33 AM   LDLDIRECT 147.7 12/29/2011 09:57 AM    Wt Readings  from Last 3 Encounters:  09/09/19 167 lb (75.8 kg)  05/28/19 170 lb (77.1 kg)  05/20/19 171 lb 12.8 oz (77.9 kg)     Objective:    Vital Signs:  BP 121/86   Pulse 73   Ht 5\' 5"  (1.651 m)   Wt 167 lb (75.8 kg)   BMI 27.79 kg/m    VITAL SIGNS:  reviewed GEN:  no acute distress RESPIRATORY:  normal respiratory effort, no increased work of breathing NEURO:  alert and oriented x 3, speech normal PSYCH:  normal affect  ASSESSMENT & PLAN:    1. Hyperlipidemia, unspecified hyperlipidemia type   2. Claudication ([facility])   3. Statin myopathy   4. Coronary artery disease  involving native coronary artery of native heart without angina pectoris   5. DOE (dyspnea on exertion)   6. Essential hypertension   7. Family history of early CAD    HLD - continue Repatha, Zetia, fibrate. Lipids very well controlled. LDL low, no indication to adjust therapy at this time. Recheck lipid panel.  Claudication - improved with walking program. Continue. Normal vascular studies.  CAD - moderate CAD. Continue repatha. Consider addition of ASA 81 mg daily for secondary prevention.   COVID-19 Education: The signs and symptoms of COVID-19 were discussed with the patient and how to seek care for testing (follow up with PCP or arrange E-visit).  The importance of social distancing was discussed today.  Time:   Today, I have spent 20 minutes with the patient with telehealth technology discussing the above problems.     Medication Adjustments/Labs and Tests Ordered: Current medicines are reviewed at length with the patient today.  Concerns regarding medicines are outlined above.   Tests Ordered: Orders Placed This Encounter  Procedures  . Lipid panel    Medication Changes: No orders of the defined types were placed in this encounter.   Follow Up: 6 mo  Signed, Elouise Munroe, MD  09/09/2019 8:11 AM    [place]

## 2019-09-09 NOTE — Telephone Encounter (Signed)
  Patient Consent for Virtual Visit         JANESE Schneider has provided verbal consent on 09/09/2019 for a virtual visit (video or telephone).   CONSENT FOR VIRTUAL VISIT FOR:  Maria Schneider  By participating in this virtual visit I agree to the following:  I hereby voluntarily request, consent and authorize [facility] and its employed or contracted physicians, physician assistants, nurse practitioners or other licensed health care professionals (the Practitioner), to provide me with telemedicine health care services (the "Services") as deemed necessary by the treating Practitioner. I acknowledge and consent to receive the Services by the Practitioner via telemedicine. I understand that the telemedicine visit will involve communicating with the Practitioner through live audiovisual communication technology and the disclosure of certain medical information by electronic transmission. I acknowledge that I have been given the opportunity to request an in-person assessment or other available alternative prior to the telemedicine visit and am voluntarily participating in the telemedicine visit.  I understand that I have the right to withhold or withdraw my consent to the use of telemedicine in the course of my care at any time, without affecting my right to future care or treatment, and that the Practitioner or I may terminate the telemedicine visit at any time. I understand that I have the right to inspect all information obtained and/or recorded in the course of the telemedicine visit and may receive copies of available information for a reasonable fee.  I understand that some of the potential risks of receiving the Services via telemedicine include:  [name] Delay or interruption in medical evaluation due to technological equipment failure or disruption; . Information transmitted may not be sufficient (e.g. poor resolution of images) to allow for appropriate medical decision making by the Practitioner;  and/or  . In rare instances, security protocols could fail, causing a breach of personal health information.  Furthermore, I acknowledge that it is my responsibility to provide information about my medical history, conditions and care that is complete and accurate to the best of my ability. I acknowledge that Practitioner's advice, recommendations, and/or decision may be based on factors not within their control, such as incomplete or inaccurate data provided by me or distortions of diagnostic images or specimens that may result from electronic transmissions. I understand that the practice of medicine is not an exact science and that Practitioner makes no warranties or guarantees regarding treatment outcomes. I acknowledge that a copy of this consent can be made available to me via my patient portal ([facility]), or I can request a printed copy by calling the office of [facility].    I understand that my insurance will be billed for this visit.   I have read or had this consent read to me. . I understand the contents of this consent, which adequately explains the benefits and risks of the Services being provided via telemedicine.  . I have been provided ample opportunity to ask questions regarding this consent and the Services and have had my questions answered to my satisfaction. . I give my informed consent for the services to be provided through the use of telemedicine in my medical care

## 2019-09-10 ENCOUNTER — Other Ambulatory Visit: Payer: Self-pay

## 2019-09-10 ENCOUNTER — Ambulatory Visit: Payer: Medicare PPO | Admitting: Family Medicine

## 2019-09-10 ENCOUNTER — Encounter: Payer: Self-pay | Admitting: Family Medicine

## 2019-09-10 VITALS — BP 124/60 | HR 77 | Temp 97.7°F | Wt 169.4 lb

## 2019-09-10 DIAGNOSIS — S81851A Open bite, right lower leg, initial encounter: Secondary | ICD-10-CM

## 2019-09-10 DIAGNOSIS — L03115 Cellulitis of right lower limb: Secondary | ICD-10-CM

## 2019-09-10 DIAGNOSIS — E785 Hyperlipidemia, unspecified: Secondary | ICD-10-CM | POA: Diagnosis not present

## 2019-09-10 DIAGNOSIS — W540XXA Bitten by dog, initial encounter: Secondary | ICD-10-CM | POA: Diagnosis not present

## 2019-09-10 LAB — LIPID PANEL
Chol/HDL Ratio: 1.6 ratio (ref 0.0–4.4)
Cholesterol, Total: 126 mg/dL (ref 100–199)
HDL: 81 mg/dL (ref >39)
LDL Chol Calc (NIH): 27 mg/dL (ref 0–99)
Triglycerides: 103 mg/dL (ref 0–149)
VLDL Cholesterol Cal: 18 mg/dL (ref 5–40)

## 2019-09-10 MED ORDER — AMOXICILLIN-POT CLAVULANATE 875-125 MG PO TABS: 1.0000 | ORAL_TABLET | Freq: Two times a day (BID) | ORAL | 0 refills | Status: DC

## 2019-09-10 NOTE — Patient Instructions (Signed)
Keep clean with soap and water and leave off the Neosporin  Take the antibiotic twice daily with food  Watch for any fever, increased swelling or any pus like drainage.

## 2019-09-10 NOTE — Progress Notes (Signed)
Established Patient Office Visit  Subjective:  Patient ID: Maria Schneider, female    DOB: [birth year]  Age: 74 y.o. MRN: [id number]  CC:  Chief Complaint  Patient presents with  . Animal Bite    pt states happened about 2 weeks ago friends dog bite right ankle and is now red and not healing     HPI Maria Schneider presents for dog bite right posterior leg about 2 weeks ago.  Dog was a chihuahua who belongs to a friend and dog has been fully vaccinated.  The dog had been brought to their place of work and apparently someone came into their shop was very loud and she thinks the dog got nervous and bit her leg.  The dog has not been acting sickly or overly aggressive in any way.  He has had rabies vaccine previously.  Patient's last tetanus was 2019.  She tried Neosporin for the first week.  She has had some surrounding erythema and slow to heal wound.  No drainage.  No fevers or chills.  Past Medical History:  Diagnosis Date  . Allergy   . Arthritis   . Asthma   . Cancer ([place])    hx skin cancer  . Depression   . Detrusor instability   . Diverticulosis    not symptomatic  . GERD (gastroesophageal reflux disease)   . History of kidney stones   . History of pancreatitis    April 2015  . History of skin cancer   . Hyperlipidemia   . Hypertension   . Osteopenia   . Urinary incontinence   . Vitamin D deficiency    RESOLVED     Past Surgical History:  Procedure Laterality Date  . ABDOMINAL HYSTERECTOMY  1994   TAH/ BLADDER SUSPENSION  . BREAST BIOPSY  1972   benign  . CHOLECYSTECTOMY N/A 07/12/2013   Procedure: LAPAROSCOPIC CHOLECYSTECTOMY WITH INTRAOPERATIVE CHOLANGIOGRAM;  Surgeon: Edward Jolly, MD;  Location: [facility];  Service: General;  Laterality: N/A;  . COLONOSCOPY  06-24-12   per Dr. Earlean Shawl, benign polyps, repeat in 5 yrs    . ERCP N/A 07/14/2013   Procedure: ENDOSCOPIC RETROGRADE CHOLANGIOPANCREATOGRAPHY (ERCP);  Surgeon: Gatha Mayer, MD;  Location: [facility];  Service: Endoscopy;  Laterality: N/A;  . TONSILLECTOMY    . TOTAL HIP ARTHROPLASTY Right 01/13/2015   Procedure: RIGHT TOTAL HIP ARTHROPLASTY ANTERIOR APPROACH;  Surgeon: Gaynelle Arabian, MD;  Location: [facility];  Service: Orthopedics;  Laterality: Right;  . TOTAL HIP ARTHROPLASTY Left 11/07/2017   Procedure: LEFT TOTAL HIP ARTHROPLASTY ANTERIOR APPROACH;  Surgeon: Gaynelle Arabian, MD;  Location: [facility];  Service: Orthopedics;  Laterality: Left;  . TUBAL LIGATION      Family History  Problem Relation Age of Onset  . Hypertension Mother   . Osteoporosis Mother   . Heart disease Father   . Osteoporosis Sister   . Breast cancer Paternal Aunt        Age 72's  . Diabetes Paternal Uncle     Social History   Socioeconomic History  . Marital status: Married    Spouse name: Not on file  . Number of children: 1  . Years of education: 94  . Highest education level: Not on file  Occupational History  . Occupation: retired  Tobacco Use  . Smoking status: Former Smoker    Years: 42.00    Types: Cigarettes    Quit date: 07/09/2008    Years since quitting: 11.1  .  Smokeless tobacco: Never Used  . Tobacco comment: 9 years ago  Vaping Use  . Vaping Use: Never used  Substance and Sexual Activity  . Alcohol use: Yes    Alcohol/week: 2.0 standard drinks    Types: 2 Glasses of wine per week    Comment: OCC  . Drug use: No  . Sexual activity: Never    Birth control/protection: Surgical  Other Topics Concern  . Not on file  Social History Narrative   Gets regular exercise   Right handed   Two story home   No caffeine      Social Determinants of Health   Financial Resource Strain:   . Difficulty of Paying Living Expenses:   Food Insecurity:   . Worried About Charity fundraiser in the Last Year:   . Arboriculturist in the Last Year:   Transportation Needs:   . Film/video editor (Medical):   [name] Lack of Transportation (Non-Medical):   Physical Activity:   . Days of  Exercise per Week:   . Minutes of Exercise per Session:   Stress:   . Feeling of Stress :   Social Connections:   . Frequency of Communication with Friends and Family:   . Frequency of Social Gatherings with Friends and Family:   . Attends Religious Services:   . Active Member of Clubs or Organizations:   . Attends Archivist Meetings:   [name] Marital Status:   Intimate Partner Violence:   . Fear of Current or Ex-Partner:   . Emotionally Abused:   [name] Physically Abused:   . Sexually Abused:     Outpatient Medications Prior to Visit  Medication Sig Dispense Refill  . buPROPion (WELLBUTRIN XL) 300 MG 24 hr tablet Take 300 mg by mouth daily.      . Cholecalciferol (VITAMIN D3) 25 MCG (1000 UT) CAPS Take 2 capsules by mouth daily.    [name] esomeprazole (NEXIUM) 20 MG capsule Take 20 mg by mouth daily.     . Evolocumab (REPATHA SURECLICK) 101 MG/ML SOAJ Inject 140 mg into the skin every 14 (fourteen) days. 2 pen 11  . ezetimibe (ZETIA) 10 MG tablet TAKE 1 TABLET BY MOUTH EVERY DAY 90 tablet 3  . fenofibrate 160 MG tablet Take 1 tablet (160 mg total) by mouth daily. 90 tablet 3  . metoprolol succinate (TOPROL-XL) 25 MG 24 hr tablet TAKE 1 TABLET BY MOUTH EVERY DAY 90 tablet 0  . MYRBETRIQ 50 MG TB24 tablet Take 50 mg by mouth daily.  11  . sertraline (ZOLOFT) 100 MG tablet Take 200 mg by mouth daily.     . valsartan (DIOVAN) 80 MG tablet TAKE 1 TABLET BY MOUTH EVERY DAY 90 tablet 2   No facility-administered medications prior to visit.    Allergies  Allergen Reactions  . Ace Inhibitors     REACTION: cough  . Codeine Nausea And Vomiting  . Fentanyl Itching  . Statins     REACTION: MYALGIAS-muscle pain    ROS Review of Systems  Constitutional: Negative for chills and fever.      Objective:    Physical Exam Vitals reviewed.  Constitutional:      Appearance: Normal appearance.  Cardiovascular:     Rate and Rhythm: Normal rate.  Pulmonary:     Effort: Pulmonary effort  is normal.     Breath sounds: Normal breath sounds.  Skin:    Comments: Right posterior leg about 8 cm above the distal  Achilles she has puncture wound with a surrounding zone of erythema about 2 x 2 cm.  There is no fluctuance.  No purulent drainage.  Minimal warmth.  Minimal tenderness  Neurological:     Mental Status: She is alert.     BP 124/60 (BP Location: Left Arm, Patient Position: Sitting, Cuff Size: Normal)   Pulse 77   Temp 97.7 F (36.5 C) (Temporal)   Wt 169 lb 6.4 oz (76.8 kg)   SpO2 94%   BMI 28.19 kg/m  Wt Readings from Last 3 Encounters:  09/10/19 169 lb 6.4 oz (76.8 kg)  09/09/19 167 lb (75.8 kg)  05/28/19 170 lb (77.1 kg)     Health Maintenance Due  Topic Date Due  . COVID-19 Vaccine (1) Never done  . PAP SMEAR-Modifier  03/26/2011  . PNA vac Low Risk Adult (2 of 2 - PPSV23) 02/23/2017    There are no preventive care reminders to display for this patient.  Lab Results  Component Value Date   TSH 2.19 07/02/2017   Lab Results  Component Value Date   WBC 4.7 04/15/2019   HGB 11.6 04/15/2019   HCT 36.4 04/15/2019   MCV 85 04/15/2019   PLT 193 04/15/2019   Lab Results  Component Value Date   NA 141 04/15/2019   K 4.5 04/15/2019   CO2 23 04/15/2019   GLUCOSE 79 04/15/2019   BUN 21 04/15/2019   CREATININE 1.17 (H) 04/15/2019   BILITOT 0.6 08/20/2018   ALKPHOS 60 08/20/2018   AST 22 08/20/2018   ALT 17 08/20/2018   PROT 6.7 08/20/2018   ALBUMIN 4.0 08/20/2018   CALCIUM 9.3 04/15/2019   ANIONGAP 10 08/20/2018   GFR 53.36 (L) 07/02/2017   Lab Results  Component Value Date   CHOL 126 09/10/2019   Lab Results  Component Value Date   HDL 81 09/10/2019   Lab Results  Component Value Date   LDLCALC 27 09/10/2019   Lab Results  Component Value Date   TRIG 103 09/10/2019   Lab Results  Component Value Date   CHOLHDL 1.6 09/10/2019   Lab Results  Component Value Date   HGBA1C 5.5 07/02/2017      Assessment & Plan:   Dog bite  2 weeks ago with mild cellulitis changes on exam.  Patient's tetanus up-to-date.  Very low risk for rabies as friend's dog has been fully vaccinated  -Keep wound clean with soap and water -Leave off Neosporin -Augmentin 875 mg twice daily with food for 7 days -Follow-up for any increased erythema, fever, increased swelling, or other concerns  Meds ordered this encounter  Medications  . amoxicillin-clavulanate (AUGMENTIN) 875-125 MG tablet    Sig: Take 1 tablet by mouth 2 (two) times daily.    Dispense:  14 tablet    Refill:  0    Follow-up: No follow-ups on file.    Carolann Littler, MD

## 2019-09-16 DIAGNOSIS — N3281 Overactive bladder: Secondary | ICD-10-CM | POA: Diagnosis not present

## 2019-10-03 ENCOUNTER — Encounter: Payer: Self-pay | Admitting: Family Medicine

## 2019-10-03 ENCOUNTER — Ambulatory Visit: Payer: Medicare PPO | Admitting: Family Medicine

## 2019-10-03 ENCOUNTER — Other Ambulatory Visit: Payer: Self-pay

## 2019-10-03 VITALS — BP 124/62 | HR 62 | Temp 97.9°F | Wt 169.0 lb

## 2019-10-03 DIAGNOSIS — R350 Frequency of micturition: Secondary | ICD-10-CM

## 2019-10-03 DIAGNOSIS — Z1231 Encounter for screening mammogram for malignant neoplasm of breast: Secondary | ICD-10-CM | POA: Diagnosis not present

## 2019-10-03 DIAGNOSIS — N390 Urinary tract infection, site not specified: Secondary | ICD-10-CM | POA: Diagnosis not present

## 2019-10-03 LAB — POCT URINALYSIS DIPSTICK
Bilirubin, UA: NEGATIVE
Blood, UA: NEGATIVE
Glucose, UA: NEGATIVE
Ketones, UA: NEGATIVE
Nitrite, UA: NEGATIVE
Protein, UA: NEGATIVE
Spec Grav, UA: 1.02 (ref 1.010–1.025)
Urobilinogen, UA: 0.2 E.U./dL
pH, UA: 6 (ref 5.0–8.0)

## 2019-10-03 MED ORDER — CIPROFLOXACIN HCL 500 MG PO TABS: 500.0000 mg | ORAL_TABLET | Freq: Two times a day (BID) | ORAL | 0 refills | Status: DC

## 2019-10-03 NOTE — Progress Notes (Signed)
   Subjective:    Patient ID: Maria Schneider, female    DOB: 1945/07/10, 74 y.o.   MRN: [id number]  HPI Here for 4 days of urinary burning and urgency. No fever or back pain.   Review of Systems  Constitutional: Negative.   Respiratory: Negative.   Cardiovascular: Negative.   Genitourinary: Positive for dysuria, frequency and urgency. Negative for flank pain and hematuria.       Objective:   Physical Exam Constitutional:      Appearance: Normal appearance.  Cardiovascular:     Rate and Rhythm: Normal rate and regular rhythm.     Pulses: Normal pulses.     Heart sounds: Normal heart sounds.  Pulmonary:     Effort: Pulmonary effort is normal.     Breath sounds: Normal breath sounds.  Abdominal:     General: Abdomen is flat. Bowel sounds are normal. There is no distension.     Palpations: Abdomen is soft. There is no mass.     Tenderness: There is no abdominal tenderness. There is no guarding or rebound.     Hernia: No hernia is present.  Neurological:     Mental Status: She is alert.           Assessment & Plan:  UTI, treat with Cipro. Culture the sample.  [name], MD

## 2019-10-05 LAB — URINE CULTURE
MICRO NUMBER:: 10715378
SPECIMEN QUALITY:: ADEQUATE

## 2019-10-06 ENCOUNTER — Other Ambulatory Visit: Payer: Self-pay

## 2019-10-06 MED ORDER — NITROFURANTOIN MONOHYD MACRO 100 MG PO CAPS: 100.0000 mg | ORAL_CAPSULE | Freq: Two times a day (BID) | ORAL | 0 refills | Status: DC

## 2019-10-07 ENCOUNTER — Encounter: Payer: Self-pay | Admitting: Family Medicine

## 2019-10-07 ENCOUNTER — Telehealth (INDEPENDENT_AMBULATORY_CARE_PROVIDER_SITE_OTHER): Payer: Medicare PPO | Admitting: Family Medicine

## 2019-10-07 DIAGNOSIS — N390 Urinary tract infection, site not specified: Secondary | ICD-10-CM

## 2019-10-07 MED ORDER — ONDANSETRON HCL 8 MG PO TABS: 8.0000 mg | ORAL_TABLET | Freq: Three times a day (TID) | ORAL | 1 refills | Status: DC | PRN

## 2019-10-07 NOTE — Progress Notes (Signed)
   Subjective:    Patient ID: Maria Schneider, female    DOB: [birth year], 74 y.o.   MRN: [id number]  HPI Virtual Visit via Telephone Note  I connected with the patient on 10/07/19 at  9:00 AM EDT by telephone and verified that I am speaking with the correct person using two identifiers.   I discussed the limitations, risks, security and privacy concerns of performing an evaluation and management service by telephone and the availability of in person appointments. I also discussed with the patient that there may be a patient responsible charge related to this service. The patient expressed understanding and agreed to proceed.  Location patient: home Location provider: work or home office Participants present for the call: patient, provider Patient did not have a visit in the prior 7 days to address this/these issue(s).   History of Present Illness: Here for UTI issues. We saw her on 10-03-19 for a UTI and started her on Cipro. However when her urine culture returned yesterday we saw it had grown an E coli that was resistant to Cipro. We contacted her and told her to stop this, and we sent in [place] instead. She took one dose last night and she has had no more since then. Overnight she developed a fever to 101.8 degrees and she became very nauseated. She has vomited once this morning, but the fever is gone. She cannot drink very much fluids due to nausea. No back pain.   Observations/Objective: Patient sounds cheerful and well on the phone. I do not appreciate any SOB. Speech and thought processing are grossly intact. Patient reported vitals:  Assessment and Plan: UTI. She will continue the Macrobid as directed. We will send in Zofran 8 mg tablets to use for nausea. She will drink lot of fluids once the nausea is controlled. She may use Tylenol for fever. Recheck as needed.  [name], MD   Follow Up Instructions:     [phone] 11-20 [phone] 21-30 I did not refer this  patient for an OV in the next 24 hours for this/these issue(s).  I discussed the assessment and treatment plan with the patient. The patient was provided an opportunity to ask questions and all were answered. The patient agreed with the plan and demonstrated an understanding of the instructions.   The patient was advised to call back or seek an in-person evaluation if the symptoms worsen or if the condition fails to improve as anticipated.  I provided 13 minutes of non-face-to-face time during this encounter.   [name], MD    Review of Systems     Objective:   Physical Exam        Assessment & Plan:

## 2019-10-09 ENCOUNTER — Emergency Department (HOSPITAL_COMMUNITY)
Admission: EM | Admit: 2019-10-09 | Discharge: 2019-10-09 | Disposition: A | Discharge status: Home / Self Care | Payer: Medicare PPO | Attending: Emergency Medicine | Admitting: Emergency Medicine

## 2019-10-09 ENCOUNTER — Emergency Department (HOSPITAL_COMMUNITY): Payer: Medicare PPO

## 2019-10-09 ENCOUNTER — Encounter (HOSPITAL_COMMUNITY): Payer: Self-pay | Admitting: Emergency Medicine

## 2019-10-09 DIAGNOSIS — Z79899 Other long term (current) drug therapy: Secondary | ICD-10-CM | POA: Insufficient documentation

## 2019-10-09 DIAGNOSIS — R112 Nausea with vomiting, unspecified: Secondary | ICD-10-CM | POA: Insufficient documentation

## 2019-10-09 DIAGNOSIS — J45909 Unspecified asthma, uncomplicated: Secondary | ICD-10-CM | POA: Diagnosis not present

## 2019-10-09 DIAGNOSIS — N3000 Acute cystitis without hematuria: Secondary | ICD-10-CM | POA: Insufficient documentation

## 2019-10-09 DIAGNOSIS — I10 Essential (primary) hypertension: Secondary | ICD-10-CM | POA: Diagnosis not present

## 2019-10-09 DIAGNOSIS — Z96643 Presence of artificial hip joint, bilateral: Secondary | ICD-10-CM | POA: Insufficient documentation

## 2019-10-09 DIAGNOSIS — R350 Frequency of micturition: Secondary | ICD-10-CM | POA: Diagnosis present

## 2019-10-09 DIAGNOSIS — N390 Urinary tract infection, site not specified: Secondary | ICD-10-CM | POA: Diagnosis not present

## 2019-10-09 DIAGNOSIS — I700 Atherosclerosis of aorta: Secondary | ICD-10-CM | POA: Diagnosis not present

## 2019-10-09 LAB — CBC WITH DIFFERENTIAL/PLATELET
Abs Immature Granulocytes: 0.03 10*3/uL (ref 0.00–0.07)
Basophils Absolute: 0 10*3/uL (ref 0.0–0.1)
Basophils Relative: 0 %
Eosinophils Absolute: 0 10*3/uL (ref 0.0–0.5)
Eosinophils Relative: 0 %
HCT: 35.4 % — ABNORMAL LOW (ref 36.0–46.0)
Hemoglobin: 11.1 g/dL — ABNORMAL LOW (ref 12.0–15.0)
Immature Granulocytes: 1 %
Lymphocytes Relative: 1 %
Lymphs Abs: 0.1 10*3/uL — ABNORMAL LOW (ref 0.7–4.0)
MCH: 27.6 pg (ref 26.0–34.0)
MCHC: 31.4 g/dL (ref 30.0–36.0)
MCV: 88.1 fL (ref 80.0–100.0)
Monocytes Absolute: 0.4 10*3/uL (ref 0.1–1.0)
Monocytes Relative: 7 %
Neutro Abs: 4.5 10*3/uL (ref 1.7–7.7)
Neutrophils Relative %: 91 %
Platelets: 130 10*3/uL — ABNORMAL LOW (ref 150–400)
RBC: 4.02 MIL/uL (ref 3.87–5.11)
RDW: 13 % (ref 11.5–15.5)
WBC: 5 10*3/uL (ref 4.0–10.5)
nRBC: 0 % (ref 0.0–0.2)

## 2019-10-09 LAB — BASIC METABOLIC PANEL
Anion gap: 12 (ref 5–15)
BUN: 27 mg/dL — ABNORMAL HIGH (ref 8–23)
CO2: 27 mmol/L (ref 22–32)
Calcium: 8.8 mg/dL — ABNORMAL LOW (ref 8.9–10.3)
Chloride: 100 mmol/L (ref 98–111)
Creatinine, Ser: 1.77 mg/dL — ABNORMAL HIGH (ref 0.44–1.00)
GFR calc Af Amer: 33 mL/min — ABNORMAL LOW (ref >60)
GFR calc non Af Amer: 28 mL/min — ABNORMAL LOW (ref >60)
Glucose, Bld: 124 mg/dL — ABNORMAL HIGH (ref 70–99)
Potassium: 4.3 mmol/L (ref 3.5–5.1)
Sodium: 139 mmol/L (ref 135–145)

## 2019-10-09 LAB — URINALYSIS, ROUTINE W REFLEX MICROSCOPIC
Glucose, UA: NEGATIVE mg/dL
Ketones, ur: NEGATIVE mg/dL
Nitrite: NEGATIVE
Protein, ur: NEGATIVE mg/dL
Specific Gravity, Urine: 1.018 (ref 1.005–1.030)
pH: 5 (ref 5.0–8.0)

## 2019-10-09 LAB — LACTIC ACID, PLASMA
Lactic Acid, Venous: 2.3 mmol/L (ref 0.5–1.9)
Lactic Acid, Venous: 1.3 mmol/L (ref 0.5–1.9)

## 2019-10-09 MED ORDER — ONDANSETRON 4 MG PO TBDP: 4.0000 mg | ORAL_TABLET | Freq: Three times a day (TID) | ORAL | 0 refills | Status: DC | PRN

## 2019-10-09 MED ORDER — SODIUM CHLORIDE 0.9 % IV SOLN
1.0000 g | Freq: Once | INTRAVENOUS | Status: AC
  Administered 2019-10-09: 1 g via INTRAVENOUS
  Filled 2019-10-09: qty 10

## 2019-10-09 MED ORDER — ONDANSETRON 4 MG PO TBDP
4.0000 mg | ORAL_TABLET | Freq: Once | ORAL | Status: AC
  Administered 2019-10-09: 4 mg via ORAL
  Filled 2019-10-09: qty 1

## 2019-10-09 MED ORDER — CEPHALEXIN 500 MG PO CAPS: 500.0000 mg | ORAL_CAPSULE | Freq: Two times a day (BID) | ORAL | 0 refills | Status: AC

## 2019-10-09 MED ORDER — LACTATED RINGERS IV BOLUS
1000.0000 mL | Freq: Once | INTRAVENOUS | Status: AC
  Administered 2019-10-09: 1000 mL via INTRAVENOUS

## 2019-10-09 NOTE — ED Notes (Signed)
Date and time results received: 10/09/19 11:53 AM  Test: Lactic Acid  Critical Value: 2.3  Name of Provider Notified: Maryan Rued, MD   Orders Received? Or Actions Taken?: Orders Received - See Orders for details

## 2019-10-09 NOTE — Discharge Instructions (Addendum)
Thank you for allowing [place] to assist in your care today! Please take the antibiotic as prescribed and use the nausea medication as needed. If you begin having back pain, worsening fever or nausea, are unable to tolerate food or fluids, or any new symptoms, please return to your closest emergency department. Please follow up with your primary care physician for a follow up appointment.

## 2019-10-09 NOTE — ED Notes (Signed)
Pt discharged from this ED in stable condition at this time.IV removed.  All discharge instructions and follow up care reviewed with pt with no further questions at this time. Pt ambulatory with steady gait, clear speech.

## 2019-10-09 NOTE — ED Provider Notes (Signed)
[facility] DEPT Provider Note   CSN: [id number] Arrival date & time: 10/09/19  0551     History Chief Complaint  Patient presents with  . Vomiting  . Fever    Maria Schneider is a 74 y.o. female.  Maria Schneider is a 74 y/o with a PMHx of HTN, HLD, CAD who presents to the ED with fever for the past 4 days after having been diagnosed with a UTI 7 days. She began having episodes of urinary frequency and feeling of lower abdominal pressure 10 days ago. She saw her PCP 7 days ago and was diagnosed with a UTI and prescribed cipro. 4 days ago the patient noticed she had a fever/chills. She states her temperatures would be high in the middle of the day and at night but normal in the mornings. The highest recorded temperature was 103F. She notes taking only two doses of tylenol for the fever. That same day, her PCP called her and let her know that the bacteria was resistant to cipro and she was switched to macrobid. The patient also began vomiting 3 days ago, the first day she vomited a total of 8 times, she denies any blood in the vomit. Her last episode of vomiting was yesterday morning.   She notes that her urinary frequency has gotten better, but she is still having lower abdominal pressure. She denies sore throat, trouble swallowing, shortness of breath, cough, chest pain, back pain, abdominal pain, diarrhea, blood in her urine, foul smelling urine, problems passing her bowels, or lower extremity swelling.  The history is provided by the patient.  Fever Temp source:  Oral and subjective Severity:  Mild Progression:  Waxing and waning Relieved by:  Acetaminophen Associated symptoms: chills, nausea and vomiting   Associated symptoms: no chest pain, no diarrhea, no headaches and no sore throat   Urinary Frequency This is a new problem. The current episode started more than 1 week ago. The problem occurs constantly. The problem has been gradually improving.  Pertinent negatives include no chest pain, no abdominal pain, no headaches and no shortness of breath.       Past Medical History:  Diagnosis Date  . Allergy   . Arthritis   . Asthma   . Cancer ([place])    hx skin cancer  . Depression   . Detrusor instability   . Diverticulosis    not symptomatic  . GERD (gastroesophageal reflux disease)   . History of kidney stones   . History of pancreatitis    April 2015  . History of skin cancer   . Hyperlipidemia   . Hypertension   . Osteopenia   . Urinary incontinence   . Vitamin D deficiency    RESOLVED     Patient Active Problem List   Diagnosis Date Noted  . OA (osteoarthritis) of hip 01/13/2015  . B12 deficiency 01/07/2014  . Bradycardia 01/07/2014  . HYPERLIPIDEMIA 04/12/2009  . DEPRESSION 04/12/2009  . Essential hypertension 04/12/2009  . ALLERGIC RHINITIS 04/12/2009  . ASTHMA 04/12/2009  . GERD 04/12/2009  . URINARY INCONTINENCE 04/12/2009    Past Surgical History:  Procedure Laterality Date  . ABDOMINAL HYSTERECTOMY  1994   TAH/ BLADDER SUSPENSION  . BREAST BIOPSY  1972   benign  . CHOLECYSTECTOMY N/A 07/12/2013   Procedure: LAPAROSCOPIC CHOLECYSTECTOMY WITH INTRAOPERATIVE CHOLANGIOGRAM;  Surgeon: Edward Jolly, MD;  Location: [facility];  Service: General;  Laterality: N/A;  . COLONOSCOPY  06-24-12   per Dr. Earlean Shawl,  benign polyps, repeat in 5 yrs    . ERCP N/A 07/14/2013   Procedure: ENDOSCOPIC RETROGRADE CHOLANGIOPANCREATOGRAPHY (ERCP);  Surgeon: Gatha Mayer, MD;  Location: [facility];  Service: Endoscopy;  Laterality: N/A;  . TONSILLECTOMY    . TOTAL HIP ARTHROPLASTY Right 01/13/2015   Procedure: RIGHT TOTAL HIP ARTHROPLASTY ANTERIOR APPROACH;  Surgeon: Gaynelle Arabian, MD;  Location: [facility];  Service: Orthopedics;  Laterality: Right;  . TOTAL HIP ARTHROPLASTY Left 11/07/2017   Procedure: LEFT TOTAL HIP ARTHROPLASTY ANTERIOR APPROACH;  Surgeon: Gaynelle Arabian, MD;  Location: [facility];  Service: Orthopedics;   Laterality: Left;  . TUBAL LIGATION       OB History    Gravida  2   Para  1   Term  1   Preterm      AB  1   Living  1     SAB  1   TAB      Ectopic      Multiple      Live Births              Family History  Problem Relation Age of Onset  . Hypertension Mother   . Osteoporosis Mother   . Heart disease Father   . Osteoporosis Sister   . Breast cancer Paternal Aunt        Age 72's  . Diabetes Paternal Uncle     Social History   Tobacco Use  . Smoking status: Former Smoker    Years: 42.00    Types: Cigarettes    Quit date: 07/09/2008    Years since quitting: 11.2  . Smokeless tobacco: Never Used  . Tobacco comment: 9 years ago  Vaping Use  . Vaping Use: Never used  Substance Use Topics  . Alcohol use: Yes    Alcohol/week: 2.0 standard drinks    Types: 2 Glasses of wine per week    Comment: OCC  . Drug use: No    Home Medications Prior to Admission medications   Medication Sig Start Date End Date Taking? Authorizing Provider  acetaminophen (TYLENOL) 500 MG tablet Take 500 mg by mouth every 6 (six) hours as needed for mild pain or headache.   Yes [provider]  buPROPion (WELLBUTRIN XL) 300 MG 24 hr tablet Take 300 mg by mouth daily.     Yes [provider]  Cholecalciferol (VITAMIN D3) 25 MCG (1000 UT) CAPS Take 5,000 Units by mouth daily.    Yes [provider]  esomeprazole (NEXIUM) 20 MG capsule Take 20 mg by mouth daily.    Yes [provider]  Evolocumab (REPATHA SURECLICK) 784 MG/ML SOAJ Inject 140 mg into the skin every 14 (fourteen) days. 05/22/19  Yes Elouise Munroe, MD  ezetimibe (ZETIA) 10 MG tablet TAKE 1 TABLET BY MOUTH EVERY DAY Patient taking differently: Take 10 mg by mouth daily.  05/21/19  Yes Laurey Morale, MD  fenofibrate 160 MG tablet Take 1 tablet (160 mg total) by mouth daily. 05/02/19  Yes Laurey Morale, MD  metoprolol succinate (TOPROL-XL) 25 MG 24 hr tablet TAKE 1 TABLET BY MOUTH  EVERY DAY Patient taking differently: Take 25 mg by mouth daily.  05/21/19  Yes Laurey Morale, MD  MYRBETRIQ 50 MG TB24 tablet Take 50 mg by mouth daily. 03/02/16  Yes [provider]  nitrofurantoin, macrocrystal-monohydrate, (MACROBID) 100 MG capsule Take 1 capsule (100 mg total) by mouth 2 (two) times daily. 10/06/19  Yes Laurey Morale,  MD  ondansetron (ZOFRAN) 8 MG tablet Take 1 tablet (8 mg total) by mouth every 8 (eight) hours as needed for nausea or vomiting. 10/07/19  Yes Laurey Morale, MD  Phenazopyridine HCl (AZO URINARY PAIN PO) Take 1 tablet by mouth daily as needed (urinary pain).   Yes [provider]  sertraline (ZOLOFT) 100 MG tablet Take 200 mg by mouth daily.    Yes [provider]  valsartan (DIOVAN) 80 MG tablet TAKE 1 TABLET BY MOUTH EVERY DAY Patient taking differently: Take 80 mg by mouth daily.  06/30/19  Yes Laurey Morale, MD  amoxicillin-clavulanate (AUGMENTIN) 875-125 MG tablet Take 1 tablet by mouth 2 (two) times daily. Patient not taking: Reported on 10/09/2019 09/10/19   Eulas Post, MD  cephALEXin (KEFLEX) 500 MG capsule Take 1 capsule (500 mg total) by mouth 2 (two) times daily for 7 days. 10/09/19 10/16/19  Riesa Pope, MD  ondansetron (ZOFRAN ODT) 4 MG disintegrating tablet Take 1 tablet (4 mg total) by mouth every 8 (eight) hours as needed for nausea or vomiting. 10/09/19   Riesa Pope, MD    Allergies    Ace inhibitors, Codeine, Fentanyl, and Statins  Review of Systems   Review of Systems  Constitutional: Positive for chills and fever. Negative for fatigue.  HENT: Negative for sore throat and trouble swallowing.   Respiratory: Negative for shortness of breath and wheezing.   Cardiovascular: Negative for chest pain and leg swelling.  Gastrointestinal: Positive for nausea and vomiting. Negative for abdominal pain and diarrhea.       Abdominal pressure  Genitourinary: Positive for frequency.  Musculoskeletal:  Negative for back pain.  Neurological: Negative for headaches.  All other systems reviewed and are negative.   Physical Exam Updated Vital Signs BP (!) 124/59 (BP Location: Left Arm)   Pulse 74   Temp 98.6 F (37 C) (Oral)   Resp 17   Ht 5\' 5"  (1.651 m)   Wt 76.2 kg   SpO2 96%   BMI 27.96 kg/m   Physical Exam Vitals and nursing note reviewed.  Constitutional:      General: She is not in acute distress.    Appearance: Normal appearance. She is not ill-appearing, toxic-appearing or diaphoretic.  HENT:     Head: Normocephalic and atraumatic.     Mouth/Throat:     Mouth: Mucous membranes are dry.  Eyes:     Pupils: Pupils are equal, round, and reactive to light.  Cardiovascular:     Rate and Rhythm: Normal rate and regular rhythm.     Pulses: Normal pulses.     Heart sounds: Normal heart sounds. No murmur heard.  No gallop.   Pulmonary:     Effort: Pulmonary effort is normal. No respiratory distress.     Breath sounds: Normal breath sounds. No stridor. No wheezing, rhonchi or rales.  Abdominal:     General: Abdomen is flat. There is no distension.     Palpations: Abdomen is soft.     Tenderness: There is no abdominal tenderness. There is no right CVA tenderness, left CVA tenderness, guarding or rebound.  Musculoskeletal:     Cervical back: Neck supple. No tenderness.     Right lower leg: No edema.     Left lower leg: No edema.  Skin:    General: Skin is warm and dry.  Neurological:     General: No focal deficit present.     Mental Status: She is alert.  Psychiatric:  Mood and Affect: Mood normal.        Behavior: Behavior normal.     ED Results / Procedures / Treatments   Labs (all labs ordered are listed, but only abnormal results are displayed) Labs Reviewed  CBC WITH DIFFERENTIAL/PLATELET - Abnormal; Notable for the following components:      Result Value   Hemoglobin 11.1 (*)    HCT 35.4 (*)    Platelets 130 (*)    Lymphs Abs 0.1 (*)    All  other components within normal limits  BASIC METABOLIC PANEL - Abnormal; Notable for the following components:   Glucose, Bld 124 (*)    BUN 27 (*)    Creatinine, Ser 1.77 (*)    Calcium 8.8 (*)    GFR calc non Af Amer 28 (*)    GFR calc Af Amer 33 (*)    All other components within normal limits  URINALYSIS, ROUTINE W REFLEX MICROSCOPIC - Abnormal; Notable for the following components:   Color, Urine AMBER (*)    APPearance CLOUDY (*)    Hgb urine dipstick SMALL (*)    Bilirubin Urine SMALL (*)    Leukocytes,Ua TRACE (*)    Bacteria, UA RARE (*)    All other components within normal limits  LACTIC ACID, PLASMA - Abnormal; Notable for the following components:   Lactic Acid, Venous 2.3 (*)    All other components within normal limits  URINE CULTURE  LACTIC ACID, PLASMA    EKG None  Radiology DG Chest 1 View  Result Date: 10/09/2019 CLINICAL DATA:  Fever, UTI EXAM: CHEST  1 VIEW COMPARISON:  08/20/2018 FINDINGS: The heart size and mediastinal contours are within normal limits. Atherosclerotic calcification of the aortic knob. No focal airspace consolidation, pleural effusion, or pneumothorax. The visualized skeletal structures are unremarkable. IMPRESSION: No active disease. Electronically Signed   By: Davina Poke D.O.   On: 10/09/2019 10:59    Procedures Procedures (including critical care time)  Medications Ordered in ED Medications  ondansetron (ZOFRAN-ODT) disintegrating tablet 4 mg (4 mg Oral Given 10/09/19 1050)  lactated ringers bolus 1,000 mL (0 mLs Intravenous Stopped 10/09/19 1225)  cefTRIAXone (ROCEPHIN) 1 g in sodium chloride 0.9 % 100 mL IVPB (1 g Intravenous New Bag/Given 10/09/19 1313)    ED Course  I have reviewed the triage vital signs and the nursing notes.  Pertinent labs & imaging results that were available during my care of the patient were reviewed by me and considered in my medical decision making (see chart for details).    MDM  Rules/Calculators/A&P                          Maria Schneider is a 74 y/o F who presents to the ED today with fever for the past 4 days, vomiting the last 2 days, and recent diagnosis of UTI 7 days ago. She was having symptoms of lower abdominal pressure and increased urinary frequency, which were similar symptoms to when she had a UTI in the past. The highest recorded temperature was 103F. Patient denies back pain or worsening urinary symptoms. She notes her urinary frequency has decreased. Upon examination patient's vital signs are unremarkable, no tachycardia or hypotension. She is non-toxic appearing and non-diaphoretic, with no CVA tenderness. No abdominal tenderness. No pallor.   Differential includes pyelonephritis, pneumonia, and sepsis.   Suspect lactate of 2.3, creatinine of 1.77 and BUN of 27 due to patients vomiting and dehydration.  Chest X-ray revealed no active disease. Low suspicion for aspiration pneumonia post vomiting episode or any other cardiopulmonary disease.   Patient has no CVA tenderness at this time, low suspicion for pyelonephritis but will give patient 1 g rocephin via iv change antibiotic to keflex to cover for upper urinary infections and also prescribe ODT zofran for her nausea.   Final Clinical Impression(s) / ED Diagnoses Final diagnoses:  Acute cystitis without hematuria    Rx / DC Orders ED Discharge Orders         Ordered    cephALEXin (KEFLEX) 500 MG capsule  2 times daily     Discontinue  Reprint     10/09/19 1405    ondansetron (ZOFRAN ODT) 4 MG disintegrating tablet  Every 8 hours PRN     Discontinue  Reprint     10/09/19 1405           Riesa Pope, MD 10/09/19 1415    Blanchie Dessert, MD 10/09/19 2212

## 2019-10-09 NOTE — ED Triage Notes (Signed)
Pt diagnoses with UTI on Friday. Treated by GP with medication. Pt began vomiting on Tuesday and spiked fever on Tuesday morning.

## 2019-10-10 LAB — URINE CULTURE: Culture: <10000 — AB

## 2019-11-14 MED ORDER — ASPIRIN EC 81 MG PO TBEC: 81.0000 mg | DELAYED_RELEASE_TABLET | Freq: Every day | ORAL | 3 refills | Status: AC

## 2019-12-03 DIAGNOSIS — H04123 Dry eye syndrome of bilateral lacrimal glands: Secondary | ICD-10-CM | POA: Diagnosis not present

## 2019-12-03 DIAGNOSIS — H35363 Drusen (degenerative) of macula, bilateral: Secondary | ICD-10-CM | POA: Diagnosis not present

## 2019-12-03 DIAGNOSIS — H2513 Age-related nuclear cataract, bilateral: Secondary | ICD-10-CM | POA: Diagnosis not present

## 2019-12-03 DIAGNOSIS — H35033 Hypertensive retinopathy, bilateral: Secondary | ICD-10-CM | POA: Diagnosis not present

## 2019-12-03 DIAGNOSIS — H524 Presbyopia: Secondary | ICD-10-CM | POA: Diagnosis not present

## 2019-12-24 DIAGNOSIS — F411 Generalized anxiety disorder: Secondary | ICD-10-CM | POA: Diagnosis not present

## 2019-12-24 DIAGNOSIS — F331 Major depressive disorder, recurrent, moderate: Secondary | ICD-10-CM | POA: Diagnosis not present

## 2020-01-01 NOTE — Progress Notes (Signed)
Erroneous Encounter

## 2020-01-05 ENCOUNTER — Ambulatory Visit: Payer: Medicare PPO

## 2020-01-19 ENCOUNTER — Other Ambulatory Visit: Payer: Self-pay | Admitting: Family Medicine

## 2020-01-27 DIAGNOSIS — Z86010 Personal history of colonic polyps: Secondary | ICD-10-CM | POA: Diagnosis not present

## 2020-01-27 DIAGNOSIS — Z1211 Encounter for screening for malignant neoplasm of colon: Secondary | ICD-10-CM | POA: Diagnosis not present

## 2020-01-27 DIAGNOSIS — K5730 Diverticulosis of large intestine without perforation or abscess without bleeding: Secondary | ICD-10-CM | POA: Diagnosis not present

## 2020-01-27 DIAGNOSIS — K635 Polyp of colon: Secondary | ICD-10-CM | POA: Diagnosis not present

## 2020-01-27 DIAGNOSIS — D124 Benign neoplasm of descending colon: Secondary | ICD-10-CM | POA: Diagnosis not present

## 2020-01-27 DIAGNOSIS — K6389 Other specified diseases of intestine: Secondary | ICD-10-CM | POA: Diagnosis not present

## 2020-03-03 ENCOUNTER — Ambulatory Visit: Payer: Medicare PPO | Admitting: Internal Medicine

## 2020-03-03 ENCOUNTER — Other Ambulatory Visit: Payer: Self-pay

## 2020-03-03 ENCOUNTER — Encounter: Payer: Self-pay | Admitting: Internal Medicine

## 2020-03-03 VITALS — BP 150/74 | HR 54 | Ht 65.0 in | Wt 164.0 lb

## 2020-03-03 DIAGNOSIS — E785 Hyperlipidemia, unspecified: Secondary | ICD-10-CM

## 2020-03-03 DIAGNOSIS — R079 Chest pain, unspecified: Secondary | ICD-10-CM

## 2020-03-03 DIAGNOSIS — G720 Drug-induced myopathy: Secondary | ICD-10-CM | POA: Diagnosis not present

## 2020-03-03 DIAGNOSIS — Z6827 Body mass index (BMI) 27.0-27.9, adult: Secondary | ICD-10-CM

## 2020-03-03 DIAGNOSIS — I10 Essential (primary) hypertension: Secondary | ICD-10-CM

## 2020-03-03 DIAGNOSIS — T466X5A Adverse effect of antihyperlipidemic and antiarteriosclerotic drugs, initial encounter: Secondary | ICD-10-CM | POA: Diagnosis not present

## 2020-03-03 DIAGNOSIS — I739 Peripheral vascular disease, unspecified: Secondary | ICD-10-CM

## 2020-03-03 NOTE — Progress Notes (Signed)
Cardiology Office Note:    Date:  03/03/2020   ID:  Maria Schneider, DOB 1945-05-20, MRN [id number]  PCP:  Laurey Morale, MD  Cardiologist:  Elouise Munroe, MD  Electrophysiologist:  None   Referring MD: Laurey Morale, MD   Chief Complaint/Reason for Referral: CV review  History of Present Illness:    Maria Schneider is a 74 y.o. female with a history of asthma, depression, GERD, HTN, HLD, who presents today for follow up of chest pressure with exertion and DOE and review of cardiovascular risk factor modification.   Exercising - increased walking. Feeling well. [facility]  Prior leg pain is now better.  Occasionally she will have chest pain with bending over - no sob.   When resting she notices palpitations. Short lived, twice in 1 mo in Nov '21. I offered heart monitor, she deferred and would like to observe symptoms.  Past Medical History:  Diagnosis Date  . Allergy   . Arthritis   . Asthma   . Cancer ([place])    hx skin cancer  . Depression   . Detrusor instability   . Diverticulosis    not symptomatic  . GERD (gastroesophageal reflux disease)   . History of kidney stones   . History of pancreatitis    April 2015  . History of skin cancer   . Hyperlipidemia   . Hypertension   . Osteopenia   . Urinary incontinence   . Vitamin D deficiency    RESOLVED     Past Surgical History:  Procedure Laterality Date  . ABDOMINAL HYSTERECTOMY  1994   TAH/ BLADDER SUSPENSION  . BREAST BIOPSY  1972   benign  . CHOLECYSTECTOMY N/A 07/12/2013   Procedure: LAPAROSCOPIC CHOLECYSTECTOMY WITH INTRAOPERATIVE CHOLANGIOGRAM;  Surgeon: Edward Jolly, MD;  Location: [facility];  Service: General;  Laterality: N/A;  . COLONOSCOPY  06-24-12   per Dr. Earlean Shawl, benign polyps, repeat in 5 yrs    . ERCP N/A 07/14/2013   Procedure: ENDOSCOPIC RETROGRADE CHOLANGIOPANCREATOGRAPHY (ERCP);  Surgeon: Gatha Mayer, MD;  Location: [facility];   Service: Endoscopy;  Laterality: N/A;  . TONSILLECTOMY    . TOTAL HIP ARTHROPLASTY Right 01/13/2015   Procedure: RIGHT TOTAL HIP ARTHROPLASTY ANTERIOR APPROACH;  Surgeon: Gaynelle Arabian, MD;  Location: [facility];  Service: Orthopedics;  Laterality: Right;  . TOTAL HIP ARTHROPLASTY Left 11/07/2017   Procedure: LEFT TOTAL HIP ARTHROPLASTY ANTERIOR APPROACH;  Surgeon: Gaynelle Arabian, MD;  Location: [facility];  Service: Orthopedics;  Laterality: Left;  . TUBAL LIGATION      Current Medications: Current Meds  Medication Sig  . aspirin EC 81 MG tablet Take 1 tablet (81 mg total) by mouth daily. Swallow whole.  [name] buPROPion (WELLBUTRIN XL) 300 MG 24 hr tablet Take 300 mg by mouth daily.  . Cholecalciferol (VITAMIN D3) 25 MCG (1000 UT) CAPS Take 5,000 Units by mouth daily.   [name] esomeprazole (NEXIUM) 20 MG capsule Take 20 mg by mouth daily.   . Evolocumab (REPATHA SURECLICK) 284 MG/ML SOAJ Inject 140 mg into the skin every 14 (fourteen) days.  [name] ezetimibe (ZETIA) 10 MG tablet TAKE 1 TABLET BY MOUTH EVERY DAY (Patient taking differently: Take 10 mg by mouth daily.)  . fenofibrate 160 MG tablet Take 1 tablet (160 mg total) by mouth daily.  . metoprolol succinate (TOPROL-XL) 25 MG 24 hr tablet TAKE 1 TABLET BY MOUTH EVERY DAY  . MYRBETRIQ 50 MG TB24  tablet Take 50 mg by mouth daily.  . sertraline (ZOLOFT) 100 MG tablet Take 200 mg by mouth daily.  . valsartan (DIOVAN) 80 MG tablet TAKE 1 TABLET BY MOUTH EVERY DAY (Patient taking differently: Take 80 mg by mouth daily.)  . [DISCONTINUED] acetaminophen (TYLENOL) 500 MG tablet Take 500 mg by mouth every 6 (six) hours as needed for mild pain or headache.  . [DISCONTINUED] amoxicillin-clavulanate (AUGMENTIN) 875-125 MG tablet Take 1 tablet by mouth 2 (two) times daily.  . [DISCONTINUED] nitrofurantoin, macrocrystal-monohydrate, (MACROBID) 100 MG capsule Take 1 capsule (100 mg total) by mouth 2 (two) times daily.  . [DISCONTINUED] ondansetron (ZOFRAN ODT) 4 MG  disintegrating tablet Take 1 tablet (4 mg total) by mouth every 8 (eight) hours as needed for nausea or vomiting.  . [DISCONTINUED] ondansetron (ZOFRAN) 8 MG tablet Take 1 tablet (8 mg total) by mouth every 8 (eight) hours as needed for nausea or vomiting.  . [DISCONTINUED] Phenazopyridine HCl (AZO URINARY PAIN PO) Take 1 tablet by mouth daily as needed (urinary pain).     Allergies:   Ace inhibitors, Codeine, Fentanyl, and Statins   Social History   Tobacco Use  . Smoking status: Former Smoker    Years: 42.00    Types: Cigarettes    Quit date: 07/09/2008    Years since quitting: 11.6  . Smokeless tobacco: Never Used  . Tobacco comment: 9 years ago  Vaping Use  . Vaping Use: Never used  Substance Use Topics  . Alcohol use: Yes    Alcohol/week: 2.0 standard drinks    Types: 2 Glasses of wine per week    Comment: OCC  . Drug use: No     Family History: The patient's family history includes Breast cancer in her paternal aunt; Diabetes in her paternal uncle; Heart disease in her father; Hypertension in her mother; Osteoporosis in her mother and sister.  ROS:   Please see the history of present illness.    All other systems reviewed and are negative.  EKGs/Labs/Other Studies Reviewed:    The following studies were reviewed today:  EKG:  Sinus bradycardia rate 54  Recent Labs: 10/09/2019: BUN 27; Creatinine, Ser 1.77; Hemoglobin 11.1; Platelets 130; Potassium 4.3; Sodium 139  Recent Lipid Panel    Component Value Date/Time   CHOL 126 09/10/2019 0813   TRIG 103 09/10/2019 0813   HDL 81 09/10/2019 0813   CHOLHDL 1.6 09/10/2019 0813   CHOLHDL 3 02/18/2016 0928   VLDL 28.2 02/18/2016 0928   LDLCALC 27 09/10/2019 0813   LDLDIRECT 147.7 12/29/2011 0957    Physical Exam:    VS:  BP (!) 150/74   Pulse (!) 54   Ht 5\' 5"  (1.651 m)   Wt 164 lb (74.4 kg)   SpO2 100%   BMI 27.29 kg/m     Wt Readings from Last 5 Encounters:  03/03/20 164 lb (74.4 kg)  10/09/19 168 lb  (76.2 kg)  10/03/19 169 lb (76.7 kg)  09/10/19 169 lb 6.4 oz (76.8 kg)  09/09/19 167 lb (75.8 kg)    Constitutional: No acute distress Eyes: sclera non-icteric, normal conjunctiva and lids ENMT: normal dentition, moist mucous membranes Cardiovascular: regular rhythm, normal rate, no murmurs. S1 and S2 normal. Radial pulses normal bilaterally. No jugular venous distention.  Respiratory: clear to auscultation bilaterally GI : normal bowel sounds, soft and nontender. No distention.   MSK: extremities warm, well perfused. No edema.  NEURO: grossly nonfocal exam, moves all extremities. PSYCH: alert and oriented  x 3, normal mood and affect.   ASSESSMENT:    1. Hyperlipidemia, unspecified hyperlipidemia type   2. Essential hypertension   3. BMI 27.0-27.9,adult   4. Statin myopathy   5. Claudication ([place])   6. Chest pain, unspecified type    PLAN:    HLD - continue Repatha, Zetia, fibrate. Lipids very well controlled. LDL low. Discussed injection site pain and injecting into region with more adipose tissue.    Claudication - improved with walking program. Normal vascular studies.   CAD - moderate CAD. Continue repatha. ASA 81 mg daily.  Palpitations - occasional palpitations, offered cardiac monitor. Consider BB for symptoms.   HTN - Valsartan 80 mg daily. May need to uptitrate for higher BP reading today. Will keep BP log and update [place] on readings.  Overweight - interest in losing weight. Will refer to [facility].  Total time of encounter: 30 minutes total time of encounter, including 25 minutes spent in face-to-face patient care on the date of this encounter. This time includes coordination of care and counseling regarding above mentioned problem list. Remainder of non-face-to-face time involved reviewing chart documents/testing relevant to the patient encounter and documentation in the medical record. I have independently reviewed documentation from referring provider.   Cherlynn Kaiser, MD [place]  [facility]    Medication Adjustments/Labs and Tests Ordered: Current medicines are reviewed at length with the patient today.  Concerns regarding medicines are outlined above.   Orders Placed This Encounter  Procedures  . Amb Ref to Medical Weight Management  . EKG 12-Lead    No orders of the defined types were placed in this encounter.   Patient Instructions  Medication Instructions:  No Changes In Medications at this time.  *If you need a refill on your cardiac medications before your next appointment, please call your pharmacy*  Follow-Up: At [facility], you and your health needs are our priority.  As part of our continuing mission to provide you with exceptional heart care, we have created designated Provider Care Teams.  These Care Teams include your primary Cardiologist (physician) and Advanced Practice Providers (APPs -  Physician Assistants and Nurse Practitioners) who all work together to provide you with the care you need, when you need it.  Your next appointment:   6 month(s)  The format for your next appointment:   In Person  Provider:   Cherlynn Kaiser, MD  Other Instructions YOU ARE BEING REFERRED TO MEDICAL WEIGHT MANAGEMENT- SOMEONE WILL REACH OUT TO YOU TO SCHEDULE THIS.   PLEASE CHECK BLOOD PRESSURE DAILY, IF YOUR BLOOD PRESSURE IS CONSISTENTLY GREATER THAN 140/90 PLEASE CALL THE OFFICE AND LET us KNOW. THE NUMBER TO THE OFFICE IS [phone])

## 2020-03-03 NOTE — Patient Instructions (Addendum)
Medication Instructions:  No Changes In Medications at this time.  *If you need a refill on your cardiac medications before your next appointment, please call your pharmacy*  Follow-Up: At [facility], you and your health needs are our priority.  As part of our continuing mission to provide you with exceptional heart care, we have created designated Provider Care Teams.  These Care Teams include your primary Cardiologist (physician) and Advanced Practice Providers (APPs -  Physician Assistants and Nurse Practitioners) who all work together to provide you with the care you need, when you need it.  Your next appointment:   6 month(s)  The format for your next appointment:   In Person  Provider:   Cherlynn Kaiser, MD  Other Instructions YOU ARE BEING REFERRED TO MEDICAL WEIGHT MANAGEMENT- SOMEONE WILL REACH OUT TO YOU TO SCHEDULE THIS.   PLEASE CHECK BLOOD PRESSURE DAILY, IF YOUR BLOOD PRESSURE IS CONSISTENTLY GREATER THAN 140/90 PLEASE CALL THE OFFICE AND LET us KNOW. THE NUMBER TO THE OFFICE IS [phone])

## 2020-03-22 ENCOUNTER — Other Ambulatory Visit: Payer: Self-pay | Admitting: Family Medicine

## 2020-03-24 ENCOUNTER — Other Ambulatory Visit: Payer: Self-pay

## 2020-03-24 ENCOUNTER — Ambulatory Visit (INDEPENDENT_AMBULATORY_CARE_PROVIDER_SITE_OTHER): Payer: Medicare PPO

## 2020-03-24 DIAGNOSIS — Z0000 Encounter for general adult medical examination without abnormal findings: Secondary | ICD-10-CM

## 2020-03-24 NOTE — Patient Instructions (Addendum)
Maria Schneider , Thank you for taking time to come for your Medicare Wellness Visit. I appreciate your ongoing commitment to your health goals. Please review the following plan we discussed and let me know if I can assist you in the future.   Screening recommendations/referrals: Colonoscopy: Done 06/24/12 Mammogram: Done 10/03/19 Recommended yearly ophthalmology/optometry visit for glaucoma screening and checkup Recommended yearly dental visit for hygiene and checkup  Vaccinations: Influenza vaccine: Pt will call with dates Pneumococcal vaccine: Up to date Tdap vaccine: Up to date Shingles vaccine: Shingrix discussed. Please contact your pharmacy for coverage information.    Covid-19:Pt will call with dates of vaccination  Advanced directives: Please bring a copy of your health care power of attorney and living will to the office at your convenience.  Conditions/risks identified: Starting a health and weight management program  Next appointment: Follow up in one year for your annual wellness visit    Preventive Care 65 Years and Older, Female Preventive care refers to lifestyle choices and visits with your health care provider that can promote health and wellness. What does preventive care include?  A yearly physical exam. This is also called an annual well check.  Dental exams once or twice a year.  Routine eye exams. Ask your health care provider how often you should have your eyes checked.  Personal lifestyle choices, including:  Daily care of your teeth and gums.  Regular physical activity.  Eating a healthy diet.  Avoiding tobacco and drug use.  Limiting alcohol use.  Practicing safe sex.  Taking low-dose aspirin every day.  Taking vitamin and mineral supplements as recommended by your health care provider. What happens during an annual well check? The services and screenings done by your health care provider during your annual well check will depend on your age,  overall health, lifestyle risk factors, and family history of disease. Counseling  Your health care provider may ask you questions about your:  Alcohol use.  Tobacco use.  Drug use.  Emotional well-being.  Home and relationship well-being.  Sexual activity.  Eating habits.  History of falls.  Memory and ability to understand (cognition).  Work and work Astronomer.  Reproductive health. Screening  You may have the following tests or measurements:  Height, weight, and BMI.  Blood pressure.  Lipid and cholesterol levels. These may be checked every 5 years, or more frequently if you are over 73 years old.  Skin check.  Lung cancer screening. You may have this screening every year starting at age 73 if you have a 30-pack-year history of smoking and currently smoke or have quit within the past 15 years.  Fecal occult blood test (FOBT) of the stool. You may have this test every year starting at age 73.  Flexible sigmoidoscopy or colonoscopy. You may have a sigmoidoscopy every 5 years or a colonoscopy every 10 years starting at age 80.  Hepatitis C blood test.  Hepatitis B blood test.  Sexually transmitted disease (STD) testing.  Diabetes screening. This is done by checking your blood sugar (glucose) after you have not eaten for a while (fasting). You may have this done every 1-3 years.  Bone density scan. This is done to screen for osteoporosis. You may have this done starting at age 73.  Mammogram. This may be done every 1-2 years. Talk to your health care provider about how often you should have regular mammograms. Talk with your health care provider about your test results, treatment options, and if necessary, the need  for more tests. Vaccines  Your health care provider may recommend certain vaccines, such as:  Influenza vaccine. This is recommended every year.  Tetanus, diphtheria, and acellular pertussis (Tdap, Td) vaccine. You may need a Td booster every 10  years.  Zoster vaccine. You may need this after age 73.  Pneumococcal 13-valent conjugate (PCV13) vaccine. One dose is recommended after age 73.  Pneumococcal polysaccharide (PPSV23) vaccine. One dose is recommended after age 73. Talk to your health care provider about which screenings and vaccines you need and how often you need them. This information is not intended to replace advice given to you by your health care provider. Make sure you discuss any questions you have with your health care provider. Document Released: 04/02/2015 Document Revised: 11/24/2015 Document Reviewed: 01/05/2015 Elsevier Interactive Patient Education  2017 [place] Prevention in the Home Falls can cause injuries. They can happen to people of all ages. There are many things you can do to make your home safe and to help prevent falls. What can I do on the outside of my home?  Regularly fix the edges of walkways and driveways and fix any cracks.  Remove anything that might make you trip as you walk through a door, such as a raised step or threshold.  Trim any bushes or trees on the path to your home.  Use bright outdoor lighting.  Clear any walking paths of anything that might make someone trip, such as rocks or tools.  Regularly check to see if handrails are loose or broken. Make sure that both sides of any steps have handrails.  Any raised decks and porches should have guardrails on the edges.  Have any leaves, snow, or ice cleared regularly.  Use sand or salt on walking paths during winter.  Clean up any spills in your garage right away. This includes oil or grease spills. What can I do in the bathroom?  Use night lights.  Install grab bars by the toilet and in the tub and shower. Do not use towel bars as grab bars.  Use non-skid mats or decals in the tub or shower.  If you need to sit down in the shower, use a plastic, non-slip stool.  Keep the floor dry. Clean up any water that  spills on the floor as soon as it happens.  Remove soap buildup in the tub or shower regularly.  Attach bath mats securely with double-sided non-slip rug tape.  Do not have throw rugs and other things on the floor that can make you trip. What can I do in the bedroom?  Use night lights.  Make sure that you have a light by your bed that is easy to reach.  Do not use any sheets or blankets that are too big for your bed. They should not hang down onto the floor.  Have a firm chair that has side arms. You can use this for support while you get dressed.  Do not have throw rugs and other things on the floor that can make you trip. What can I do in the kitchen?  Clean up any spills right away.  Avoid walking on wet floors.  Keep items that you use a lot in easy-to-reach places.  If you need to reach something above you, use a strong step stool that has a grab bar.  Keep electrical cords out of the way.  Do not use floor polish or wax that makes floors slippery. If you must use wax, use  non-skid floor wax.  Do not have throw rugs and other things on the floor that can make you trip. What can I do with my stairs?  Do not leave any items on the stairs.  Make sure that there are handrails on both sides of the stairs and use them. Fix handrails that are broken or loose. Make sure that handrails are as long as the stairways.  Check any carpeting to make sure that it is firmly attached to the stairs. Fix any carpet that is loose or worn.  Avoid having throw rugs at the top or bottom of the stairs. If you do have throw rugs, attach them to the floor with carpet tape.  Make sure that you have a light switch at the top of the stairs and the bottom of the stairs. If you do not have them, ask someone to add them for you. What else can I do to help prevent falls?  Wear shoes that:  Do not have high heels.  Have rubber bottoms.  Are comfortable and fit you well.  Are closed at the  toe. Do not wear sandals.  If you use a stepladder:  Make sure that it is fully opened. Do not climb a closed stepladder.  Make sure that both sides of the stepladder are locked into place.  Ask someone to hold it for you, if possible.  Clearly mark and make sure that you can see:  Any grab bars or handrails.  First and last steps.  Where the edge of each step is.  Use tools that help you move around (mobility aids) if they are needed. These include:  Canes.  Walkers.  Scooters.  Crutches.  Turn on the lights when you go into a dark area. Replace any light bulbs as soon as they burn out.  Set up your furniture so you have a clear path. Avoid moving your furniture around.  If any of your floors are uneven, fix them.  If there are any pets around you, be aware of where they are.  Review your medicines with your doctor. Some medicines can make you feel dizzy. This can increase your chance of falling. Ask your doctor what other things that you can do to help prevent falls. This information is not intended to replace advice given to you by your health care provider. Make sure you discuss any questions you have with your health care provider. Document Released: 12/31/2008 Document Revised: 08/12/2015 Document Reviewed: 04/10/2014 Elsevier Interactive Patient Education  2017 [employer].

## 2020-03-24 NOTE — Progress Notes (Signed)
Virtual Visit via Telephone Note  I connected with  Maria Schneider on 03/24/20 at  9:30 AM EST by telephone and verified that I am speaking with the correct person using two identifiers.  Location: Patient: Home Provider: Office Persons participating in the virtual visit: patient/Nurse Health Advisor   I discussed the limitations, risks, security and privacy concerns of performing an evaluation and management service by telephone and the availability of in person appointments. The patient expressed understanding and agreed to proceed.  Interactive audio and video telecommunications were attempted between this nurse and patient, however failed, due to patient having technical difficulties OR patient did not have access to video capability.  We continued and completed visit with audio only.  Some vital signs may be absent or patient reported.   Marzella Schlein, LPN    Subjective:   Maria Schneider is a 75 y.o. female who presents for an Initial Medicare Annual Wellness Visit.  Review of Systems     Cardiac Risk Factors include: advanced age (>74men, >5 women);dyslipidemia;hypertension     Objective:    There were no vitals filed for this visit. There is no height or weight on file to calculate BMI.  Advanced Directives 03/24/2020 10/09/2019 03/05/2019 08/20/2018 11/09/2017 11/07/2017 11/07/2017  Does Patient Have a Medical Advance Directive? Yes Yes Yes No - Yes Yes  Type of Estate agent of [place];Living will Healthcare Power of [place];Living will - - Public affairs consultant of [employer] of [place];Living will Healthcare Power of [place];Living will  Does patient want to make changes to medical advance directive? - - - - No - Patient declined No - Patient declined -  Copy of Healthcare Power of Attorney in Chart? No - copy requested - - - Yes Yes No - copy requested  Would patient like information on creating a medical advance directive? - - - No -  Patient declined - - -    Current Medications (verified) Outpatient Encounter Medications as of 03/24/2020  Medication Sig  . aspirin EC 81 MG tablet Take 1 tablet (81 mg total) by mouth daily. Swallow whole.  [name] buPROPion (WELLBUTRIN XL) 300 MG 24 hr tablet Take 300 mg by mouth daily.  . Cholecalciferol (VITAMIN D3) 25 MCG (1000 UT) CAPS Take 5,000 Units by mouth daily.   [name] esomeprazole (NEXIUM) 20 MG capsule Take 20 mg by mouth daily.   . Evolocumab (REPATHA SURECLICK) 140 MG/ML SOAJ Inject 140 mg into the skin every 14 (fourteen) days.  [name] ezetimibe (ZETIA) 10 MG tablet TAKE 1 TABLET BY MOUTH EVERY DAY (Patient taking differently: Take 10 mg by mouth daily.)  . fenofibrate 160 MG tablet Take 1 tablet (160 mg total) by mouth daily.  . metoprolol succinate (TOPROL-XL) 25 MG 24 hr tablet TAKE 1 TABLET BY MOUTH EVERY DAY  . MYRBETRIQ 50 MG TB24 tablet Take 50 mg by mouth daily.  . sertraline (ZOLOFT) 100 MG tablet Take 200 mg by mouth daily.  . valsartan (DIOVAN) 80 MG tablet TAKE 1 TABLET BY MOUTH EVERY DAY (Patient taking differently: Take 80 mg by mouth daily.)   No facility-administered encounter medications on file as of 03/24/2020.    Allergies (verified) Ace inhibitors, Codeine, Fentanyl, and Statins   History: Past Medical History:  Diagnosis Date  . Allergy   . Arthritis   . Asthma   . Cancer (HCC)    hx skin cancer  . Depression   . Detrusor instability   . Diverticulosis  not symptomatic  . GERD (gastroesophageal reflux disease)   . History of kidney stones   . History of pancreatitis    April 2015  . History of skin cancer   . Hyperlipidemia   . Hypertension   . Osteopenia   . Urinary incontinence   . Vitamin D deficiency    RESOLVED    Past Surgical History:  Procedure Laterality Date  . ABDOMINAL HYSTERECTOMY  1994   TAH/ BLADDER SUSPENSION  . BREAST BIOPSY  1972   benign  . CHOLECYSTECTOMY N/A 07/12/2013   Procedure: LAPAROSCOPIC CHOLECYSTECTOMY WITH  INTRAOPERATIVE CHOLANGIOGRAM;  Surgeon: Mariella Saa, MD;  Location: [facility];  Service: General;  Laterality: N/A;  . COLONOSCOPY  06-24-12   per Dr. Kinnie Scales, benign polyps, repeat in 5 yrs    . ERCP N/A 07/14/2013   Procedure: ENDOSCOPIC RETROGRADE CHOLANGIOPANCREATOGRAPHY (ERCP);  Surgeon: Iva Boop, MD;  Location: [facility];  Service: Endoscopy;  Laterality: N/A;  . TONSILLECTOMY    . TOTAL HIP ARTHROPLASTY Right 01/13/2015   Procedure: RIGHT TOTAL HIP ARTHROPLASTY ANTERIOR APPROACH;  Surgeon: Ollen Gross, MD;  Location: [facility];  Service: Orthopedics;  Laterality: Right;  . TOTAL HIP ARTHROPLASTY Left 11/07/2017   Procedure: LEFT TOTAL HIP ARTHROPLASTY ANTERIOR APPROACH;  Surgeon: Ollen Gross, MD;  Location: [facility];  Service: Orthopedics;  Laterality: Left;  . TUBAL LIGATION     Family History  Problem Relation Age of Onset  . Hypertension Mother   . Osteoporosis Mother   . Heart disease Father   . Osteoporosis Sister   . Breast cancer Paternal Aunt        Age 35's  . Diabetes Paternal Uncle    Social History   Socioeconomic History  . Marital status: Married    Spouse name: Not on file  . Number of children: 1  . Years of education: 55  . Highest education level: Not on file  Occupational History  . Occupation: retired  Tobacco Use  . Smoking status: Former Smoker    Years: 42.00    Types: Cigarettes    Quit date: 07/09/2008    Years since quitting: 11.7  . Smokeless tobacco: Never Used  . Tobacco comment: 9 years ago  Vaping Use  . Vaping Use: Never used  Substance and Sexual Activity  . Alcohol use: Yes    Alcohol/week: 2.0 standard drinks    Types: 2 Glasses of wine per week    Comment: OCC  . Drug use: No  . Sexual activity: Never    Birth control/protection: Surgical  Other Topics Concern  . Not on file  Social History Narrative   Gets regular exercise   Right handed   Two story home   No caffeine      Social Determinants of Health    Financial Resource Strain: Low Risk   . Difficulty of Paying Living Expenses: Not hard at all  Food Insecurity: No Food Insecurity  . Worried About Programme researcher, broadcasting/film/video in the Last Year: Never true  . Ran Out of Food in the Last Year: Never true  Transportation Needs: No Transportation Needs  . Lack of Transportation (Medical): No  . Lack of Transportation (Non-Medical): No  Physical Activity: Sufficiently Active  . Days of Exercise per Week: 5 days  . Minutes of Exercise per Session: 30 min  Stress: No Stress Concern Present  . Feeling of Stress : Only a little  Social Connections: Moderately Integrated  . Frequency of Communication  with Friends and Family: More than three times a week  . Frequency of Social Gatherings with Friends and Family: Twice a week  . Attends Religious Services: More than 4 times per year  . Active Member of Clubs or Organizations: No  . Attends Banker Meetings: Never  . Marital Status: Married    Tobacco Counseling Counseling given: Not Answered Comment: 9 years ago   Clinical Intake:  Pre-visit preparation completed: Yes  Pain : No/denies pain     BMI - recorded: 27.29 Nutritional Status: BMI 25 -29 Overweight Nutritional Risks: None Diabetes: No  How often do you need to have someone help you when you read instructions, pamphlets, or other written materials from your doctor or pharmacy?: 1 - Never  Diabetic?No  Interpreter Needed?: No  Information entered by :: Lanier Ensign, LPN   Activities of Daily Living In your present state of health, do you have any difficulty performing the following activities: 03/24/2020  Hearing? Y  Comment mild loss stated  Vision? Y  Difficulty concentrating or making decisions? Y  Comment short term memory at times is bad  Walking or climbing stairs? N  Dressing or bathing? N  Doing errands, shopping? N  Preparing Food and eating ? N  Using the Toilet? N  In the past six months,  have you accidently leaked urine? Y  Comment incontinence issues  Do you have problems with loss of bowel control? N  Managing your Medications? N  Managing your Finances? N  Housekeeping or managing your Housekeeping? N  Some recent data might be hidden    Patient Care Team: Nelwyn Salisbury, MD as PCP - General Parke Poisson, MD as PCP - Cardiology (Cardiology) Drema Dallas, DO as Consulting Physician (Neurology)  Indicate any recent Medical Services you may have received from other than [facility] providers in the past year (date may be approximate).     Assessment:   This is a routine wellness examination for Kiyani.  Hearing/Vision screen  Hearing Screening   125Hz  250Hz  500Hz  1000Hz  2000Hz  3000Hz  4000Hz  6000Hz  8000Hz   Right ear:           Left ear:           Comments: Pt states mild hearing loss  Vision Screening Comments: Pt follows up with Dr for annual eye exams  Dietary issues and exercise activities discussed: Current Exercise Habits: Home exercise routine, Type of exercise: walking, Time (Minutes): 30, Frequency (Times/Week): 5, Weekly Exercise (Minutes/Week): 150  Goals    . Patient Stated     Starting a health and weight management program      Depression Screen PHQ 2/9 Scores 03/24/2020 04/22/2014  PHQ - 2 Score 1 0    Fall Risk Fall Risk  03/24/2020 03/05/2019 05/21/2017 04/17/2016 10/12/2015  Falls in the past year? 0 1 No No No  Number falls in past yr: 0 0 - - -  Injury with Fall? 0 1 - - -  Risk for fall due to : Impaired vision;Impaired balance/gait - - - -  Follow up Falls prevention discussed - - - -    FALL RISK PREVENTION PERTAINING TO THE HOME:  Any stairs in or around the home? Yes  If so, are there any without handrails? No  Home free of loose throw rugs in walkways, pet beds, electrical cords, etc? Yes  Adequate lighting in your home to reduce risk of falls? Yes   ASSISTIVE DEVICES UTILIZED TO PREVENT  FALLS:  Life alert? No   Use of a cane, walker or w/c? No  Grab bars in the bathroom? Yes  Shower chair or bench in shower? Yes  Elevated toilet seat or a handicapped toilet? Yes   TIMED UP AND GO:  Was the test performed? No .  Cognitive Function:     6CIT Screen 03/24/2020  What Year? 0 points  What month? 0 points  Count back from 20 0 points  Months in reverse 0 points  Repeat phrase 0 points    Immunizations Immunization History  Administered Date(s) Administered  . Fluad Quad(high Dose 65+) 12/21/2018  . Influenza Split 01/10/2011, 12/14/2011  . Influenza, High Dose Seasonal PF 01/16/2013, 12/22/2014, 01/20/2016, 01/03/2018  . Influenza,inj,Quad PF,6+ Mos 12/24/2013  . Influenza,inj,quad, With Preservative 03/06/2017  . Influenza-Unspecified 01/02/2017  . Pneumococcal Conjugate-13 02/24/2016  . Pneumococcal Polysaccharide-23 01/01/2009  . Tdap 01/03/2018  . Zoster 11/14/2010    TDAP status: Up to date  Flu Vaccine status: Up to date pt will call back with dates  Pneumococcal vaccine status: Up to date  Covid-19 vaccine status: Information provided on how to obtain vaccines.   Pt stated she has had Vaccines will call with dates   Qualifies for Shingles Vaccine? Yes   Zostavax completed Yes   Shingrix Completed?: No.    Education has been provided regarding the importance of this vaccine. Patient has been advised to call insurance company to determine out of pocket expense if they have not yet received this vaccine. Advised may also receive vaccine at local pharmacy or Health Dept. Verbalized acceptance and understanding.  Screening Tests Health Maintenance  Topic Date Due  . COVID-19 Vaccine (1) Never done  . PNA vac Low Risk Adult (2 of 2 - PPSV23) 02/23/2017  . INFLUENZA VACCINE  10/19/2019  . MAMMOGRAM  10/02/2020  . COLONOSCOPY (Pts 45-54yrs Insurance coverage will need to be confirmed)  06/25/2022  . TETANUS/TDAP  01/04/2028  . DEXA SCAN  Completed  . Hepatitis C Screening   Completed  . PAP SMEAR-Modifier  Discontinued    Health Maintenance  Health Maintenance Due  Topic Date Due  . COVID-19 Vaccine (1) Never done  . PNA vac Low Risk Adult (2 of 2 - PPSV23) 02/23/2017  . INFLUENZA VACCINE  10/19/2019    Colorectal cancer screening: Type of screening: Colonoscopy. Completed 06/24/12. Repeat every 5 years   Mammogram status: Completed 10/03/19. Repeat every year  Bone Density status: Completed 02/22/2004. Results reflect: Bone density results: NORMAL. Repeat every 0 years.    Additional Screening:  Hepatitis C Screening: Completed 02/26/15  Vision Screening: Recommended annual ophthalmology exams for early detection of glaucoma and other disorders of the eye. Is the patient up to date with their annual eye exam?  Yes  Who is the provider or what is the name of the office in which the patient attends annual eye exams? Dr Baldemar Lenis   Dental Screening: Recommended annual dental exams for proper oral hygiene  Community Resource Referral / Chronic Care Management: CRR required this visit?  No   CCM required this visit?  No      Plan:     I have personally reviewed and noted the following in the patient's chart:   . Medical and social history . Use of alcohol, tobacco or illicit drugs  . Current medications and supplements . Functional ability and status . Nutritional status . Physical activity . Advanced directives . List of other physicians . Hospitalizations, surgeries,  and ER visits in previous 12 months . Vitals . Screenings to include cognitive, depression, and falls . Referrals and appointments  In addition, I have reviewed and discussed with patient certain preventive protocols, quality metrics, and best practice recommendations. A written personalized care plan for preventive services as well as general preventive health recommendations were provided to patient.     Marzella Schlein, LPN   03/24/5206   Nurse Notes:  None

## 2020-04-01 DIAGNOSIS — F411 Generalized anxiety disorder: Secondary | ICD-10-CM | POA: Diagnosis not present

## 2020-04-01 DIAGNOSIS — F331 Major depressive disorder, recurrent, moderate: Secondary | ICD-10-CM | POA: Diagnosis not present

## 2020-05-29 ENCOUNTER — Other Ambulatory Visit: Payer: Self-pay | Admitting: Internal Medicine

## 2020-05-29 ENCOUNTER — Other Ambulatory Visit: Payer: Self-pay | Admitting: Family Medicine

## 2020-06-03 NOTE — Telephone Encounter (Signed)
Patient is calling in regards to this prescription due to the pharmacy not receiving it yet. Patient is completely out of medication

## 2020-06-11 DIAGNOSIS — Z85828 Personal history of other malignant neoplasm of skin: Secondary | ICD-10-CM | POA: Diagnosis not present

## 2020-06-11 DIAGNOSIS — L603 Nail dystrophy: Secondary | ICD-10-CM | POA: Diagnosis not present

## 2020-06-11 DIAGNOSIS — D225 Melanocytic nevi of trunk: Secondary | ICD-10-CM | POA: Diagnosis not present

## 2020-06-11 DIAGNOSIS — D2261 Melanocytic nevi of right upper limb, including shoulder: Secondary | ICD-10-CM | POA: Diagnosis not present

## 2020-06-11 DIAGNOSIS — L720 Epidermal cyst: Secondary | ICD-10-CM | POA: Diagnosis not present

## 2020-06-11 DIAGNOSIS — L821 Other seborrheic keratosis: Secondary | ICD-10-CM | POA: Diagnosis not present

## 2020-06-11 DIAGNOSIS — L820 Inflamed seborrheic keratosis: Secondary | ICD-10-CM | POA: Diagnosis not present

## 2020-06-11 DIAGNOSIS — L918 Other hypertrophic disorders of the skin: Secondary | ICD-10-CM | POA: Diagnosis not present

## 2020-09-03 ENCOUNTER — Ambulatory Visit: Payer: Medicare PPO | Admitting: Internal Medicine

## 2020-09-06 ENCOUNTER — Other Ambulatory Visit: Payer: Self-pay

## 2020-09-06 ENCOUNTER — Ambulatory Visit: Payer: Medicare PPO | Admitting: Family Medicine

## 2020-09-06 ENCOUNTER — Encounter: Payer: Self-pay | Admitting: Family Medicine

## 2020-09-06 VITALS — BP 140/80 | HR 62 | Temp 97.9°F | Wt 165.0 lb

## 2020-09-06 DIAGNOSIS — R109 Unspecified abdominal pain: Secondary | ICD-10-CM | POA: Diagnosis not present

## 2020-09-06 DIAGNOSIS — M545 Low back pain, unspecified: Secondary | ICD-10-CM | POA: Diagnosis not present

## 2020-09-06 MED ORDER — METHYLPREDNISOLONE 4 MG PO TBPK: ORAL_TABLET | ORAL | 0 refills | Status: DC

## 2020-09-06 MED ORDER — CYCLOBENZAPRINE HCL 10 MG PO TABS: 10.0000 mg | ORAL_TABLET | Freq: Three times a day (TID) | ORAL | 2 refills | Status: DC | PRN

## 2020-09-06 NOTE — Progress Notes (Signed)
   Subjective:    Patient ID: Maria Schneider, female    DOB: Nov 26, 1945, 75 y.o.   MRN: [id number]  HPI Here for 4 weeks of intermittent sharp pains in the right lower back and flank. No urinary symptoms. No recent trauma. Using heat and Aleve.    Review of Systems  Constitutional: Negative.   Respiratory: Negative.    Cardiovascular: Negative.   Gastrointestinal: Negative.   Genitourinary: Negative.   Musculoskeletal:  Positive for back pain.      Objective:   Physical Exam Constitutional:      Appearance: Normal appearance.  Cardiovascular:     Rate and Rhythm: Normal rate and regular rhythm.     Pulses: Normal pulses.     Heart sounds: Normal heart sounds.  Pulmonary:     Effort: Pulmonary effort is normal.     Breath sounds: Normal breath sounds.  Abdominal:     Tenderness: There is no right CVA tenderness or left CVA tenderness.  Musculoskeletal:     Comments: Tender just to the right of the upper lumbar spine area. ROM is limited by pain.   Neurological:     Mental Status: She is alert.          Assessment & Plan:  Muscle spasms from a right pinched nerve root. Use Flexeril TID and a Medrol dose pack. [name], MD

## 2020-09-17 ENCOUNTER — Other Ambulatory Visit: Payer: Self-pay

## 2020-09-17 MED ORDER — METOPROLOL SUCCINATE ER 25 MG PO TB24: 25.0000 mg | ORAL_TABLET | Freq: Every day | ORAL | 0 refills | Status: DC

## 2020-10-11 ENCOUNTER — Encounter: Payer: Self-pay | Admitting: Family Medicine

## 2020-10-11 ENCOUNTER — Telehealth (INDEPENDENT_AMBULATORY_CARE_PROVIDER_SITE_OTHER): Payer: Medicare PPO | Admitting: Family Medicine

## 2020-10-11 ENCOUNTER — Other Ambulatory Visit: Payer: Self-pay

## 2020-10-11 VITALS — Temp 99.0°F

## 2020-10-11 DIAGNOSIS — J019 Acute sinusitis, unspecified: Secondary | ICD-10-CM

## 2020-10-11 MED ORDER — AZITHROMYCIN 250 MG PO TABS: ORAL_TABLET | ORAL | 0 refills | Status: DC

## 2020-10-11 NOTE — Progress Notes (Signed)
   Subjective:    Patient ID: Maria Schneider, female    DOB: February 27, 1946, 75 y.o.   MRN: [id number]  HPI Virtual Visit via Telephone Note  I connected with the patient on 10/11/20 at  3:30 PM EDT by telephone and verified that I am speaking with the correct person using two identifiers.   I discussed the limitations, risks, security and privacy concerns of performing an evaluation and management service by telephone and the availability of in person appointments. I also discussed with the patient that there may be a patient responsible charge related to this service. The patient expressed understanding and agreed to proceed.  Location patient: home Location provider: work or home office Participants present for the call: patient, provider Patient did not have a visit in the prior 7 days to address this/these issue(s).   History of Present Illness: Here for 8 days of what started with a ST and lots of PND. Now she has a stuffy head and a dry cough. No fever or body aches. No SOB or NVD. She has tested negative for the Covid virus 3 times.    Observations/Objective: Patient sounds cheerful and well on the phone. I do not appreciate any SOB. Speech and thought processing are grossly intact. Patient reported vitals:  Assessment and Plan: Sinusitis, treat with a Zpack. Drink fluids.  [name], MD   Follow Up Instructions:     [phone] 11-20 [phone] 21-30 I did not refer this patient for an OV in the next 24 hours for this/these issue(s).  I discussed the assessment and treatment plan with the patient. The patient was provided an opportunity to ask questions and all were answered. The patient agreed with the plan and demonstrated an understanding of the instructions.   The patient was advised to call back or seek an in-person evaluation if the symptoms worsen or if the condition fails to improve as anticipated.  I provided 16 minutes of non-face-to-face time during this  encounter.   [name], MD     Review of Systems     Objective:   Physical Exam        Assessment & Plan:

## 2020-10-28 DIAGNOSIS — Z1231 Encounter for screening mammogram for malignant neoplasm of breast: Secondary | ICD-10-CM | POA: Diagnosis not present

## 2020-10-28 LAB — HM MAMMOGRAPHY

## 2020-11-01 ENCOUNTER — Other Ambulatory Visit: Payer: Self-pay | Admitting: Family Medicine

## 2020-11-04 ENCOUNTER — Encounter: Payer: Self-pay | Admitting: Family Medicine

## 2020-11-16 ENCOUNTER — Ambulatory Visit: Payer: Medicare PPO | Admitting: Internal Medicine

## 2020-11-16 ENCOUNTER — Other Ambulatory Visit: Payer: Self-pay

## 2020-11-16 ENCOUNTER — Encounter: Payer: Self-pay | Admitting: Internal Medicine

## 2020-11-16 VITALS — BP 118/70 | HR 67 | Ht 65.0 in | Wt 163.2 lb

## 2020-11-16 DIAGNOSIS — Z6827 Body mass index (BMI) 27.0-27.9, adult: Secondary | ICD-10-CM | POA: Diagnosis not present

## 2020-11-16 DIAGNOSIS — R0600 Dyspnea, unspecified: Secondary | ICD-10-CM

## 2020-11-16 DIAGNOSIS — E785 Hyperlipidemia, unspecified: Secondary | ICD-10-CM

## 2020-11-16 DIAGNOSIS — I10 Essential (primary) hypertension: Secondary | ICD-10-CM | POA: Diagnosis not present

## 2020-11-16 DIAGNOSIS — R61 Generalized hyperhidrosis: Secondary | ICD-10-CM | POA: Diagnosis not present

## 2020-11-16 DIAGNOSIS — R002 Palpitations: Secondary | ICD-10-CM

## 2020-11-16 DIAGNOSIS — R0609 Other forms of dyspnea: Secondary | ICD-10-CM

## 2020-11-16 NOTE — Patient Instructions (Signed)
Medication Instructions:  No Changes In Medications at this time.  *If you need a refill on your cardiac medications before your next appointment, please call your pharmacy*  Lab Work: CBC, CMET, LIPID, TSH If you have labs (blood work) drawn today and your tests are completely normal, you will receive your results only by: [place] (if you have MyChart) OR A paper copy in the mail If you have any lab test that is abnormal or we need to change your treatment, we will call you to review the results.  Testing/Procedures: Your physician has requested that you have a [place]. BEFORE September 23rd. For further information please visit [email or web address]. Please follow instruction sheet, as given.  The test will take approximately 3 to 4 hours to complete; you may bring reading material.  If someone comes with you to your appointment, they will need to remain in the main lobby due to limited space in the testing area.    How to prepare for your Myocardial Perfusion Test: Do not eat or drink 3 hours prior to your test, except you may have water. Do not consume products containing caffeine (regular or decaffeinated) 12 hours prior to your test. (ex: coffee, chocolate, sodas, tea). Do wear comfortable clothes (no dresses or overalls) and walking shoes, tennis shoes preferred (No heels or open toe shoes are allowed). Do NOT wear cologne, perfume, aftershave, or lotions (deodorant is allowed). If you use an inhaler, use it the AM of your test and bring it with you.  If you use a nebulizer, use it the AM of your test.  If these instructions are not followed, your test will have to be rescheduled.   Follow-Up: At [facility], you and your health needs are our priority.  As part of our continuing mission to provide you with exceptional heart care, we have created designated Provider Care Teams.  These Care Teams include your primary Cardiologist (physician) and Advanced  Practice Providers (APPs -  Physician Assistants and Nurse Practitioners) who all work together to provide you with the care you need, when you need it.  Your next appointment:   September 23rd at 9:20  The format for your next appointment:   In Person  Provider:   Cherlynn Kaiser, MD

## 2020-11-16 NOTE — Progress Notes (Signed)
Cardiology Office Note:    Date:  11/16/2020   ID:  Maria Schneider, DOB [birth year], MRN [id number]  PCP:  Laurey Morale, MD  Cardiologist:  Elouise Munroe, MD  Electrophysiologist:  None   Referring MD: Laurey Morale, MD   Chief Complaint/Reason for Referral: Moderate CAD  History of Present Illness:    Maria Schneider is a 75 y.o. female with a history of asthma, depression, GERD, HTN, HLD, who presents today for follow up of chest pressure with exertion and DOE and review of cardiovascular risk factor modification.   She was doing well at her last visit, but notes that in the last 3 months she has been having diaphoresis on the back of her neck and a feeling of overall warmth while completing any housework.  In addition on occasion she will have a sense of exercise intolerance while climbing the stairs in her home where she will have to stop and rest.  She feels like her whole body is working very hard and occasionally will have a sense of heart racing.  No significant chest pressure, some dyspnea on exertion.  She is curious about her laboratory studies since labs have not been completed since last year, and we discussed routine lab studies as well as checking TSH for symptoms she describes today.  The patient denies chest pain, chest pressure, dyspnea at rest, PND, orthopnea, or leg swelling. Denies cough, fever, chills. Denies nausea, vomiting. Denies syncope or presyncope. Denies dizziness or lightheadedness.    Past Medical History:  Diagnosis Date   Allergy    Arthritis    Asthma    Cancer ([place])    hx skin cancer   Depression    Detrusor instability    Diverticulosis    not symptomatic   GERD (gastroesophageal reflux disease)    History of kidney stones    History of pancreatitis    April 2015   History of skin cancer    Hyperlipidemia    Hypertension    Osteopenia    Urinary incontinence    Vitamin D deficiency    RESOLVED     Past Surgical History:   Procedure Laterality Date   ABDOMINAL HYSTERECTOMY  1994   TAH/ BLADDER SUSPENSION   BREAST BIOPSY  1972   benign   CHOLECYSTECTOMY N/A 07/12/2013   Procedure: LAPAROSCOPIC CHOLECYSTECTOMY WITH INTRAOPERATIVE CHOLANGIOGRAM;  Surgeon: Edward Jolly, MD;  Location: [facility];  Service: General;  Laterality: N/A;   COLONOSCOPY  06-24-12   per Dr. Earlean Shawl, benign polyps, repeat in 5 yrs     ERCP N/A 07/14/2013   Procedure: ENDOSCOPIC RETROGRADE CHOLANGIOPANCREATOGRAPHY (ERCP);  Surgeon: Gatha Mayer, MD;  Location: [facility];  Service: Endoscopy;  Laterality: N/A;   TONSILLECTOMY     TOTAL HIP ARTHROPLASTY Right 01/13/2015   Procedure: RIGHT TOTAL HIP ARTHROPLASTY ANTERIOR APPROACH;  Surgeon: Gaynelle Arabian, MD;  Location: [facility];  Service: Orthopedics;  Laterality: Right;   TOTAL HIP ARTHROPLASTY Left 11/07/2017   Procedure: LEFT TOTAL HIP ARTHROPLASTY ANTERIOR APPROACH;  Surgeon: Gaynelle Arabian, MD;  Location: [facility];  Service: Orthopedics;  Laterality: Left;   TUBAL LIGATION      Current Medications: Current Meds  Medication Sig   aspirin EC 81 MG tablet Take 1 tablet (81 mg total) by mouth daily. Swallow whole.   buPROPion (WELLBUTRIN XL) 300 MG 24 hr tablet Take 300 mg by mouth daily.   Cholecalciferol (VITAMIN D3) 25 MCG (1000 UT) CAPS Take 5,000  Units by mouth daily.    esomeprazole (NEXIUM) 20 MG capsule Take 20 mg by mouth daily.    ezetimibe (ZETIA) 10 MG tablet TAKE 1 TABLET BY MOUTH EVERY DAY (Patient taking differently: Take 10 mg by mouth daily.)   fenofibrate 160 MG tablet TAKE 1 TABLET BY MOUTH EVERY DAY   metoprolol succinate (TOPROL-XL) 25 MG 24 hr tablet Take 1 tablet (25 mg total) by mouth daily.   MYRBETRIQ 50 MG TB24 tablet Take 50 mg by mouth daily.   REPATHA SURECLICK 297 MG/ML SOAJ INJECT 140 MG INTO THE SKIN EVERY 14 (FOURTEEN) DAYS.   sertraline (ZOLOFT) 100 MG tablet Take 200 mg by mouth daily.   valsartan (DIOVAN) 80 MG tablet TAKE 1 TABLET BY MOUTH EVERY  DAY     Allergies:   Ace inhibitors, Codeine, Fentanyl, and Statins   Social History   Tobacco Use   Smoking status: Former    Years: 42.00    Types: Cigarettes    Quit date: 07/09/2008    Years since quitting: 12.3   Smokeless tobacco: Never   Tobacco comments:    9 years ago  Vaping Use   Vaping Use: Never used  Substance Use Topics   Alcohol use: Yes    Alcohol/week: 2.0 standard drinks    Types: 2 Glasses of wine per week    Comment: OCC   Drug use: No     Family History: The patient's family history includes Breast cancer in her paternal aunt; Diabetes in her paternal uncle; Heart disease in her father; Hypertension in her mother; Osteoporosis in her mother and sister.  ROS:   Please see the history of present illness.    All other systems reviewed and are negative.  EKGs/Labs/Other Studies Reviewed:    The following studies were reviewed today:  EKG:  11/16/20: NSR, low voltage QRS 03/03/20: Sinus bradycardia rate 54  Recent Labs: No results found for requested labs within last 8760 hours.  Recent Lipid Panel    Component Value Date/Time   CHOL 126 09/10/2019 0813   TRIG 103 09/10/2019 0813   HDL 81 09/10/2019 0813   CHOLHDL 1.6 09/10/2019 0813   CHOLHDL 3 02/18/2016 0928   VLDL 28.2 02/18/2016 0928   LDLCALC 27 09/10/2019 0813   LDLDIRECT 147.7 12/29/2011 0957    Physical Exam:    VS:  BP 118/70 (BP Location: Right Arm, Patient Position: Sitting, Cuff Size: Normal)   Pulse 67   Ht _0  (1.651 m)   Wt 163 lb 3.2 oz (74 kg)   SpO2 97%   BMI 27.16 kg/m     Wt Readings from Last 5 Encounters:  11/16/20 163 lb 3.2 oz (74 kg)  09/06/20 165 lb (74.8 kg)  03/03/20 164 lb (74.4 kg)  10/09/19 168 lb (76.2 kg)  10/03/19 169 lb (76.7 kg)    Constitutional: No acute distress Eyes: sclera non-icteric, normal conjunctiva and lids ENMT: normal dentition, moist mucous membranes Cardiovascular: regular rhythm, normal rate, no murmurs. S1 and S2 normal.  Radial pulses normal bilaterally. No jugular venous distention.  Respiratory: clear to auscultation bilaterally GI : normal bowel sounds, soft and nontender. No distention.   MSK: extremities warm, well perfused. No edema.  NEURO: grossly nonfocal exam, moves all extremities. PSYCH: alert and oriented x 3, normal mood and affect.    ASSESSMENT:    1. Essential hypertension   2. DOE (dyspnea on exertion)   3. Diaphoresis   4. Hyperlipidemia, unspecified hyperlipidemia type  5. BMI 27.0-27.9,adult   6. Palpitations     PLAN:    HLD - continue Repatha, Zetia, fibrate. Lipids very well controlled. LDL low.  She is requesting repeat lipids today, will complete.    Dyspnea on exertion Palpitations Diaphoresis CAD  - moderate CAD by coronary CTA last year. Continue repatha. ASA 81 mg daily.  We will obtain laboratory studies for diaphoresis and dyspnea on exertion including TSH and CBC, as well as c-Met and lipids for following of Repatha.  Patient is quite concerned about the possibility of a fatal MI and her symptoms may need to be evaluated further in the setting of known moderate CAD detected last year.  We have participated in shared decision making and determined a nuclear stress test would be the next best step.  If this is nonischemic, it is unlikely that her symptoms are coming from cardiac cause given normal FFR after CT last year.  HTN - Valsartan 80 mg daily.   Overweight -weight has improved since last year, continue lifestyle measures.  Total time of encounter: 30 minutes total time of encounter, including 25 minutes spent in face-to-face patient care on the date of this encounter. This time includes coordination of care and counseling regarding above mentioned problem list. Remainder of non-face-to-face time involved reviewing chart documents/testing relevant to the patient encounter and documentation in the medical record. I have independently reviewed documentation from  referring provider.   Cherlynn Kaiser, MD, [place]   Shared Decision Making/Informed Consent:   Shared Decision Making/Informed Consent The risks [chest pain, shortness of breath, cardiac arrhythmias, dizziness, blood pressure fluctuations, myocardial infarction, stroke/transient ischemic attack, nausea, vomiting, allergic reaction, radiation exposure, metallic taste sensation and life-threatening complications (estimated to be 1 in 10,000)], benefits (risk stratification, diagnosing coronary artery disease, treatment guidance) and alternatives of a nuclear stress test were discussed in detail with Ms. Drone and she agrees to proceed.    Medication Adjustments/Labs and Tests Ordered: Current medicines are reviewed at length with the patient today.  Concerns regarding medicines are outlined above.   Orders Placed This Encounter  Procedures   CBC   Comprehensive metabolic panel   Lipid panel   TSH   MYOCARDIAL PERFUSION IMAGING   EKG 12-Lead     No orders of the defined types were placed in this encounter.   Patient Instructions  Medication Instructions:  No Changes In Medications at this time.  *If you need a refill on your cardiac medications before your next appointment, please call your pharmacy*  Lab Work: CBC, CMET, LIPID, TSH If you have labs (blood work) drawn today and your tests are completely normal, you will receive your results only by: [place] (if you have MyChart) OR A paper copy in the mail If you have any lab test that is abnormal or we need to change your treatment, we will call you to review the results.  Testing/Procedures: Your physician has requested that you have a [place]. BEFORE September 23rd. For further information please visit [email or web address]. Please follow instruction sheet, as given.  The test will take approximately 3 to 4 hours to complete; you may bring reading material.  If  someone comes with you to your appointment, they will need to remain in the main lobby due to limited space in the testing area.    How to prepare for your Myocardial Perfusion Test: Do not eat or drink 3 hours prior to your test, except you  may have water. Do not consume products containing caffeine (regular or decaffeinated) 12 hours prior to your test. (ex: coffee, chocolate, sodas, tea). Do wear comfortable clothes (no dresses or overalls) and walking shoes, tennis shoes preferred (No heels or open toe shoes are allowed). Do NOT wear cologne, perfume, aftershave, or lotions (deodorant is allowed). If you use an inhaler, use it the AM of your test and bring it with you.  If you use a nebulizer, use it the AM of your test.  If these instructions are not followed, your test will have to be rescheduled.   Follow-Up: At [facility], you and your health needs are our priority.  As part of our continuing mission to provide you with exceptional heart care, we have created designated Provider Care Teams.  These Care Teams include your primary Cardiologist (physician) and Advanced Practice Providers (APPs -  Physician Assistants and Nurse Practitioners) who all work together to provide you with the care you need, when you need it.  Your next appointment:   September 23rd at 9:20  The format for your next appointment:   In Person  Provider:   Cherlynn Kaiser, MD

## 2020-11-17 DIAGNOSIS — R0600 Dyspnea, unspecified: Secondary | ICD-10-CM | POA: Diagnosis not present

## 2020-11-17 DIAGNOSIS — R61 Generalized hyperhidrosis: Secondary | ICD-10-CM | POA: Diagnosis not present

## 2020-11-17 LAB — LIPID PANEL
Chol/HDL Ratio: 1.8 ratio (ref 0.0–4.4)
Cholesterol, Total: 129 mg/dL (ref 100–199)
HDL: 71 mg/dL (ref >39)
LDL Chol Calc (NIH): 37 mg/dL (ref 0–99)
Triglycerides: 119 mg/dL (ref 0–149)
VLDL Cholesterol Cal: 21 mg/dL (ref 5–40)

## 2020-11-17 LAB — CBC
Hematocrit: 34.1 % (ref 34.0–46.6)
Hemoglobin: 11.1 g/dL (ref 11.1–15.9)
MCH: 28.5 pg (ref 26.6–33.0)
MCHC: 32.6 g/dL (ref 31.5–35.7)
MCV: 87 fL (ref 79–97)
Platelets: 190 10*3/uL (ref 150–450)
RBC: 3.9 x10E6/uL (ref 3.77–5.28)
RDW: 13.1 % (ref 11.7–15.4)
WBC: 3.9 10*3/uL (ref 3.4–10.8)

## 2020-11-17 LAB — COMPREHENSIVE METABOLIC PANEL
ALT: 15 IU/L (ref 0–32)
AST: 24 IU/L (ref 0–40)
Albumin/Globulin Ratio: 2.4 — ABNORMAL HIGH (ref 1.2–2.2)
Albumin: 4.5 g/dL (ref 3.7–4.7)
Alkaline Phosphatase: 53 IU/L (ref 44–121)
BUN/Creatinine Ratio: 15 (ref 12–28)
BUN: 20 mg/dL (ref 8–27)
Bilirubin Total: 0.2 mg/dL (ref 0.0–1.2)
CO2: 22 mmol/L (ref 20–29)
Calcium: 8.9 mg/dL (ref 8.7–10.3)
Chloride: 104 mmol/L (ref 96–106)
Creatinine, Ser: 1.34 mg/dL — ABNORMAL HIGH (ref 0.57–1.00)
Globulin, Total: 1.9 g/dL (ref 1.5–4.5)
Glucose: 89 mg/dL (ref 65–99)
Potassium: 4.6 mmol/L (ref 3.5–5.2)
Sodium: 141 mmol/L (ref 134–144)
Total Protein: 6.4 g/dL (ref 6.0–8.5)
eGFR: 42 mL/min/{1.73_m2} — ABNORMAL LOW (ref >59)

## 2020-11-17 LAB — TSH: TSH: 2.49 u[IU]/mL (ref 0.450–4.500)

## 2020-11-25 ENCOUNTER — Telehealth (HOSPITAL_COMMUNITY): Payer: Self-pay

## 2020-11-25 NOTE — Telephone Encounter (Signed)
Spoke with the patient, detailed instructions given. She stated that she would be here for her test. Asked to call back with any questions. S.Emunah Texidor EMTP 

## 2020-11-27 ENCOUNTER — Other Ambulatory Visit: Payer: Self-pay | Admitting: Family Medicine

## 2020-11-30 ENCOUNTER — Ambulatory Visit (HOSPITAL_COMMUNITY): Payer: Medicare PPO | Attending: Internal Medicine

## 2020-11-30 ENCOUNTER — Other Ambulatory Visit: Payer: Self-pay

## 2020-11-30 DIAGNOSIS — R0600 Dyspnea, unspecified: Secondary | ICD-10-CM

## 2020-11-30 DIAGNOSIS — R0609 Other forms of dyspnea: Secondary | ICD-10-CM

## 2020-11-30 DIAGNOSIS — R61 Generalized hyperhidrosis: Secondary | ICD-10-CM | POA: Diagnosis not present

## 2020-11-30 LAB — MYOCARDIAL PERFUSION IMAGING
LV dias vol: 51 mL (ref 46–106)
LV sys vol: 15 mL
Nuc Stress EF: 71 %
Peak HR: 88 {beats}/min
Rest HR: 62 {beats}/min
Rest Nuclear Isotope Dose: 10.7 mCi
SDS: 0
SRS: 0
SSS: 0
ST Depression (mm): 0 mm
Stress Nuclear Isotope Dose: 30.7 mCi
TID: 1.08

## 2020-11-30 MED ORDER — TECHNETIUM TC 99M TETROFOSMIN IV KIT
10.7000 | PACK | Freq: Once | INTRAVENOUS | Status: AC | PRN
  Administered 2020-11-30: 10.7 via INTRAVENOUS
  Filled 2020-11-30: qty 11

## 2020-11-30 MED ORDER — TECHNETIUM TC 99M TETROFOSMIN IV KIT
30.7000 | PACK | Freq: Once | INTRAVENOUS | Status: AC | PRN
  Administered 2020-11-30: 30.7 via INTRAVENOUS
  Filled 2020-11-30: qty 31

## 2020-11-30 MED ORDER — REGADENOSON 0.4 MG/5ML IV SOLN
0.4000 mg | Freq: Once | INTRAVENOUS | Status: AC
  Administered 2020-11-30: 0.4 mg via INTRAVENOUS

## 2020-12-06 ENCOUNTER — Telehealth: Payer: Self-pay | Admitting: Internal Medicine

## 2020-12-06 ENCOUNTER — Telehealth: Payer: Self-pay | Admitting: Family Medicine

## 2020-12-06 NOTE — Telephone Encounter (Signed)
PT called to advise that they tested positive for Covid and would like to see what is the recommended steps to take. Please advise as well as to how long they need to quarantine.

## 2020-12-06 NOTE — Telephone Encounter (Signed)
Contacted patient in regards to the repatha question, patient verbalized understanding.  Also advised I would route message to Dr.Acharya and her primary RN to advise if okay to do virtual on Friday since she has COVID.   Thank you!

## 2020-12-06 NOTE — Telephone Encounter (Signed)
Pt c/o medication issue:  1. Name of Medication: REPATHA SURECLICK [id number] MG/ML SOAJ  2. How are you currently taking this medication (dosage and times per day)? Inject every 14 days  3. Are you having a reaction (difficulty breathing--STAT)? no  4. What is your medication issue? Patient states she is due to take the injection today, but has Covid. She would like to know if she can still take it. She also would like to know if her appointment Friday can be virtual.

## 2020-12-06 NOTE — Telephone Encounter (Signed)
It is ok to take her injection of Reaptha while she has COVID. But if she prefers to wait until she feels better, than is fine as well.  I cannot answer question about virtual apt with Dr. Loni Muse

## 2020-12-06 NOTE — Telephone Encounter (Signed)
Pt states she stared having symptoms on 9/15; sore throat, low grade temp.; cough, congestion; denies SOB, temp no higher 99.8.  Denies palpitations or chest. Pt states she has taken Tylenol.  Pt states she feels better today.  Reviewed isolation guidelines & pt verb understanding. Pt advised that if she is continuing to get better than she does not need additional medications other than the OTC meds she is taking; if she begins getting worse or develops new symptoms pt is advised to call office for appt.  Pt informed that she may feel more fatigue & have a lingering cough over the next few weeks.

## 2020-12-08 ENCOUNTER — Encounter: Payer: Self-pay | Admitting: Family Medicine

## 2020-12-08 ENCOUNTER — Telehealth (INDEPENDENT_AMBULATORY_CARE_PROVIDER_SITE_OTHER): Payer: Medicare PPO | Admitting: Family Medicine

## 2020-12-08 DIAGNOSIS — U071 COVID-19: Secondary | ICD-10-CM | POA: Diagnosis not present

## 2020-12-08 NOTE — Progress Notes (Signed)
Subjective:    Patient ID: Maria Schneider, female    DOB: 29-Jun-1945, 75 y.o.   MRN: [id number]  HPI Virtual Visit via Video Note  I connected with the patient on 12/08/20 at  4:00 PM EDT by a video enabled telemedicine application and verified that I am speaking with the correct person using two identifiers.  Location patient: home Location provider:work or home office Persons participating in the virtual visit: patient, provider  I discussed the limitations of evaluation and management by telemedicine and the availability of in person appointments. The patient expressed understanding and agreed to proceed.   HPI: Here for a Covid-19 infection. About 7 days ago both she and her husband developed fatigue, stuffy head, and a dry cough. She had a fever to 100 degrees, but this has resolved. No chest pain or SOB. No body aches or headache. No NVD. Both she and her husband tested positive for the Covid virus 4 days ago. She has been drinking fluids and taking Tylenol.    ROS: See pertinent positives and negatives per HPI.  Past Medical History:  Diagnosis Date   Allergy    Arthritis    Asthma    Cancer ([place])    hx skin cancer   Depression    Detrusor instability    Diverticulosis    not symptomatic   GERD (gastroesophageal reflux disease)    History of kidney stones    History of pancreatitis    April 2015   History of skin cancer    Hyperlipidemia    Hypertension    Osteopenia    Urinary incontinence    Vitamin D deficiency    RESOLVED     Past Surgical History:  Procedure Laterality Date   ABDOMINAL HYSTERECTOMY  1994   TAH/ BLADDER SUSPENSION   BREAST BIOPSY  1972   benign   CHOLECYSTECTOMY N/A 07/12/2013   Procedure: LAPAROSCOPIC CHOLECYSTECTOMY WITH INTRAOPERATIVE CHOLANGIOGRAM;  Surgeon: Edward Jolly, MD;  Location: [facility];  Service: General;  Laterality: N/A;   COLONOSCOPY  06-24-12   per Dr. Earlean Shawl, benign polyps, repeat in 5 yrs     ERCP N/A 07/14/2013    Procedure: ENDOSCOPIC RETROGRADE CHOLANGIOPANCREATOGRAPHY (ERCP);  Surgeon: Gatha Mayer, MD;  Location: [facility];  Service: Endoscopy;  Laterality: N/A;   TONSILLECTOMY     TOTAL HIP ARTHROPLASTY Right 01/13/2015   Procedure: RIGHT TOTAL HIP ARTHROPLASTY ANTERIOR APPROACH;  Surgeon: Gaynelle Arabian, MD;  Location: [facility];  Service: Orthopedics;  Laterality: Right;   TOTAL HIP ARTHROPLASTY Left 11/07/2017   Procedure: LEFT TOTAL HIP ARTHROPLASTY ANTERIOR APPROACH;  Surgeon: Gaynelle Arabian, MD;  Location: [facility];  Service: Orthopedics;  Laterality: Left;   TUBAL LIGATION      Family History  Problem Relation Age of Onset   Hypertension Mother    Osteoporosis Mother    Heart disease Father    Osteoporosis Sister    Breast cancer Paternal Aunt        Age 69's   Diabetes Paternal Uncle      Current Outpatient Medications:    aspirin EC 81 MG tablet, Take 1 tablet (81 mg total) by mouth daily. Swallow whole., Disp: 90 tablet, Rfl: 3   buPROPion (WELLBUTRIN XL) 300 MG 24 hr tablet, Take 300 mg by mouth daily., Disp: , Rfl:    Cholecalciferol (VITAMIN D3) 25 MCG (1000 UT) CAPS, Take 5,000 Units by mouth daily. , Disp: , Rfl:    esomeprazole (NEXIUM) 20 MG capsule,  Take 20 mg by mouth daily. , Disp: , Rfl:    ezetimibe (ZETIA) 10 MG tablet, TAKE 1 TABLET BY MOUTH EVERY DAY, Disp: 90 tablet, Rfl: 3   fenofibrate 160 MG tablet, TAKE 1 TABLET BY MOUTH EVERY DAY, Disp: 90 tablet, Rfl: 0   metoprolol succinate (TOPROL-XL) 25 MG 24 hr tablet, TAKE 1 TABLET (25 MG TOTAL) BY MOUTH DAILY., Disp: 90 tablet, Rfl: 0   MYRBETRIQ 50 MG TB24 tablet, Take 50 mg by mouth daily., Disp: , Rfl: 11   REPATHA SURECLICK 681 MG/ML SOAJ, INJECT 140 MG INTO THE SKIN EVERY 14 (FOURTEEN) DAYS., Disp: 140 mL, Rfl: 8   sertraline (ZOLOFT) 100 MG tablet, Take 200 mg by mouth daily., Disp: , Rfl:    valsartan (DIOVAN) 80 MG tablet, TAKE 1 TABLET BY MOUTH EVERY DAY, Disp: 90 tablet, Rfl: 1  EXAM:  VITALS per  patient if applicable:  GENERAL: alert, oriented, appears well and in no acute distress  HEENT: atraumatic, conjunttiva clear, no obvious abnormalities on inspection of external nose and ears  NECK: normal movements of the head and neck  LUNGS: on inspection no signs of respiratory distress, breathing rate appears normal, no obvious gross SOB, gasping or wheezing  CV: no obvious cyanosis  MS: moves all visible extremities without noticeable abnormality  PSYCH/NEURO: pleasant and cooperative, no obvious depression or anxiety, speech and thought processing grossly intact  ASSESSMENT AND PLAN: Covid-19 infection. She is past the 5 day window to try any antiviral medications. She sill continue to rest and drink fluids. She will continue to quarantine at home until she feels better. Recheck as needed. [name], MD  Discussed the following assessment and plan:  No diagnosis found.     I discussed the assessment and treatment plan with the patient. The patient was provided an opportunity to ask questions and all were answered. The patient agreed with the plan and demonstrated an understanding of the instructions.   The patient was advised to call back or seek an in-person evaluation if the symptoms worsen or if the condition fails to improve as anticipated.      Review of Systems     Objective:   Physical Exam        Assessment & Plan:

## 2020-12-09 NOTE — Telephone Encounter (Signed)
Pt following up wanting to know if virtual visit is an option

## 2020-12-10 ENCOUNTER — Ambulatory Visit: Payer: Medicare PPO | Admitting: Internal Medicine

## 2020-12-10 NOTE — Telephone Encounter (Signed)
Patient's appointment has been cancelled for 9/23 due to Covid- by scheduling team. Will send a message to have patients appointment rescheduled.

## 2021-01-08 ENCOUNTER — Encounter

## 2021-01-09 ENCOUNTER — Encounter

## 2021-01-17 ENCOUNTER — Encounter: Payer: Self-pay | Admitting: Internal Medicine

## 2021-01-17 ENCOUNTER — Telehealth: Payer: Medicare PPO | Admitting: Internal Medicine

## 2021-01-17 VITALS — BP 141/82 | HR 70 | Ht 65.0 in | Wt 163.0 lb

## 2021-01-17 DIAGNOSIS — R61 Generalized hyperhidrosis: Secondary | ICD-10-CM | POA: Diagnosis not present

## 2021-01-17 DIAGNOSIS — E785 Hyperlipidemia, unspecified: Secondary | ICD-10-CM

## 2021-01-17 DIAGNOSIS — R002 Palpitations: Secondary | ICD-10-CM

## 2021-01-17 DIAGNOSIS — I2510 Atherosclerotic heart disease of native coronary artery without angina pectoris: Secondary | ICD-10-CM

## 2021-01-17 DIAGNOSIS — Z6827 Body mass index (BMI) 27.0-27.9, adult: Secondary | ICD-10-CM | POA: Diagnosis not present

## 2021-01-17 DIAGNOSIS — R079 Chest pain, unspecified: Secondary | ICD-10-CM

## 2021-01-17 DIAGNOSIS — R0609 Other forms of dyspnea: Secondary | ICD-10-CM

## 2021-01-17 DIAGNOSIS — I10 Essential (primary) hypertension: Secondary | ICD-10-CM | POA: Diagnosis not present

## 2021-01-17 NOTE — Patient Instructions (Signed)

## 2021-01-17 NOTE — Progress Notes (Signed)
Virtual Visit via Video Note   This visit type was conducted due to national recommendations for restrictions regarding the COVID-19 Pandemic (e.g. social distancing) in an effort to limit this patient's exposure and mitigate transmission in our community.  Due to her co-morbid illnesses, this patient is at least at moderate risk for complications without adequate follow up.  This format is felt to be most appropriate for this patient at this time.  All issues noted in this document were discussed and addressed.  A limited physical exam was performed with this format.  Please refer to the patient's chart for her consent to telehealth for [facility].  Date:  01/17/2021   ID:  Maria Schneider, DOB 02-28-1946, MRN [id number] The patient was identified using 2 identifiers.  Patient Location: Home Provider Location: Office/Clinic   PCP:  Laurey Morale, MD   [facility] Providers Cardiologist:  Elouise Munroe, MD     Evaluation Performed:  Follow-Up Visit  Chief Complaint:  follow up  History of Present Illness:    Maria Schneider is a 75 y.o. female with asthma, depression, GERD, HTN, HLD, who presents today for follow up of chest pressure with exertion and DOE and review of cardiovascular risk factor modification.    Nuclear stress test performed for exertional diaphoresis, which was normal. She has had two reassuring functional assessments of moderate CAD (FFR and now nuclear stress), and we feel confident there is no obstructive CAD at this time.   We reviewed recent labs in detail.  Recently had mild COVID 19 infection. She has been vaccinated and boosted.  The patient denies chest pain, chest pressure, dyspnea at rest or with exertion, palpitations, PND, orthopnea, or leg swelling. Denies cough, fever, chills. Denies nausea, vomiting. Denies syncope or presyncope. Denies dizziness or lightheadedness. Denies snoring.  The patient does not have symptoms concerning for  COVID-19 infection (fever, chills, cough, or new shortness of breath).    Past Medical History:  Diagnosis Date   Allergy    Arthritis    Asthma    Cancer ([place])    hx skin cancer   Depression    Detrusor instability    Diverticulosis    not symptomatic   GERD (gastroesophageal reflux disease)    History of kidney stones    History of pancreatitis    April 2015   History of skin cancer    Hyperlipidemia    Hypertension    Osteopenia    Urinary incontinence    Vitamin D deficiency    RESOLVED    Past Surgical History:  Procedure Laterality Date   ABDOMINAL HYSTERECTOMY  1994   TAH/ BLADDER SUSPENSION   BREAST BIOPSY  1972   benign   CHOLECYSTECTOMY N/A 07/12/2013   Procedure: LAPAROSCOPIC CHOLECYSTECTOMY WITH INTRAOPERATIVE CHOLANGIOGRAM;  Surgeon: Edward Jolly, MD;  Location: [facility];  Service: General;  Laterality: N/A;   COLONOSCOPY  06-24-12   per Dr. Earlean Shawl, benign polyps, repeat in 5 yrs     ERCP N/A 07/14/2013   Procedure: ENDOSCOPIC RETROGRADE CHOLANGIOPANCREATOGRAPHY (ERCP);  Surgeon: Gatha Mayer, MD;  Location: [facility];  Service: Endoscopy;  Laterality: N/A;   TONSILLECTOMY     TOTAL HIP ARTHROPLASTY Right 01/13/2015   Procedure: RIGHT TOTAL HIP ARTHROPLASTY ANTERIOR APPROACH;  Surgeon: Gaynelle Arabian, MD;  Location: [facility];  Service: Orthopedics;  Laterality: Right;   TOTAL HIP ARTHROPLASTY Left 11/07/2017   Procedure: LEFT TOTAL HIP ARTHROPLASTY ANTERIOR APPROACH;  Surgeon: Gaynelle Arabian,  MD;  Location: [facility];  Service: Orthopedics;  Laterality: Left;   TUBAL LIGATION       Current Meds  Medication Sig   aspirin EC 81 MG tablet Take 1 tablet (81 mg total) by mouth daily. Swallow whole.   buPROPion (WELLBUTRIN XL) 300 MG 24 hr tablet Take 300 mg by mouth daily.   Cholecalciferol (VITAMIN D3) 25 MCG (1000 UT) CAPS Take 5,000 Units by mouth daily.    esomeprazole (NEXIUM) 20 MG capsule Take 20 mg by mouth daily.    ezetimibe (ZETIA) 10 MG tablet  TAKE 1 TABLET BY MOUTH EVERY DAY   fenofibrate 160 MG tablet TAKE 1 TABLET BY MOUTH EVERY DAY   metoprolol succinate (TOPROL-XL) 25 MG 24 hr tablet TAKE 1 TABLET (25 MG TOTAL) BY MOUTH DAILY.   MYRBETRIQ 50 MG TB24 tablet Take 50 mg by mouth daily.   REPATHA SURECLICK 245 MG/ML SOAJ INJECT 140 MG INTO THE SKIN EVERY 14 (FOURTEEN) DAYS.   sertraline (ZOLOFT) 100 MG tablet Take 200 mg by mouth daily.   valsartan (DIOVAN) 80 MG tablet TAKE 1 TABLET BY MOUTH EVERY DAY     Allergies:   Ace inhibitors, Codeine, Fentanyl, and Statins   Social History   Tobacco Use   Smoking status: Former    Years: 42.00    Types: Cigarettes    Quit date: 07/09/2008    Years since quitting: 12.5   Smokeless tobacco: Never   Tobacco comments:    9 years ago  Vaping Use   Vaping Use: Never used  Substance Use Topics   Alcohol use: Yes    Alcohol/week: 2.0 standard drinks    Types: 2 Glasses of wine per week    Comment: OCC   Drug use: No     Family Hx: The patient's family history includes Breast cancer in her paternal aunt; Diabetes in her paternal uncle; Heart disease in her father; Hypertension in her mother; Osteoporosis in her mother and sister.  ROS:   Please see the history of present illness.     All other systems reviewed and are negative.   Prior CV studies:   The following studies were reviewed today:  Nuclear stress test 11/30/20  Labs/Other Tests and Data Reviewed:    EKG:  No ECG reviewed.  Recent Labs: 11/17/2020: ALT 15; BUN 20; Creatinine, Ser 1.34; Hemoglobin 11.1; Platelets 190; Potassium 4.6; Sodium 141; TSH 2.490   Recent Lipid Panel Lab Results  Component Value Date/Time   CHOL 129 11/17/2020 08:26 AM   TRIG 119 11/17/2020 08:26 AM   HDL 71 11/17/2020 08:26 AM   CHOLHDL 1.8 11/17/2020 08:26 AM   CHOLHDL 3 02/18/2016 09:28 AM   LDLCALC 37 11/17/2020 08:26 AM   LDLDIRECT 147.7 12/29/2011 09:57 AM    Wt Readings from Last 3 Encounters:  01/17/21 163 lb (73.9  kg)  11/30/20 163 lb (73.9 kg)  11/16/20 163 lb 3.2 oz (74 kg)     Risk Assessment/Calculations:          Objective:    Vital Signs:  BP (!) 150/82 (BP Location: Left Arm)   Pulse 70   Ht 5\' 5"  (1.651 m)   Wt 163 lb (73.9 kg)   BMI 27.12 kg/m    VITAL SIGNS:  reviewed GEN:  no acute distress EYES:  sclerae anicteric, EOMI - Extraocular Movements Intact RESPIRATORY:  normal respiratory effort, symmetric expansion CARDIOVASCULAR:  no peripheral edema SKIN:  no rash, lesions or ulcers. MUSCULOSKELETAL:  no obvious deformities. NEURO:  alert and oriented x 3, no obvious focal deficit PSYCH:  normal affect  ASSESSMENT & PLAN:    Diaphoresis - improved, labs grossly normal. Nuclear stress test normal.  DOE (dyspnea on exertion) Chest pain, unspecified type Coronary artery disease involving native coronary artery of native heart without angina pectoris - improved, moderate CAD with no obstructive lesions. Continue repatha  140 mg Q14 days, and asa 81 mg daily.  Essential hypertension - continue valsartan 80 mg daily, metoprolol succinate 25 mg daily.   Palpitations - no active concerns. TSH normal. On BB as above.   Hyperlipidemia, unspecified hyperlipidemia type - on repatha with excellent results.          COVID-19 Education: The signs and symptoms of COVID-19 were discussed with the patient and how to seek care for testing (follow up with PCP or arrange E-visit).  The importance of social distancing was discussed today.  Time:   Today, I have spent 20 minutes with the patient with telehealth technology discussing the above problems.     Medication Adjustments/Labs and Tests Ordered: Current medicines are reviewed at length with the patient today.  Concerns regarding medicines are outlined above.   Tests Ordered: No orders of the defined types were placed in this encounter.   Medication Changes: No orders of the defined types were placed in this  encounter.   Patient Instructions  Medication Instructions:  No Changes In Medications at this time.  *If you need a refill on your cardiac medications before your next appointment, please call your pharmacy*  Follow-Up: At [facility], you and your health needs are our priority.  As part of our continuing mission to provide you with exceptional heart care, we have created designated Provider Care Teams.  These Care Teams include your primary Cardiologist (physician) and Advanced Practice Providers (APPs -  Physician Assistants and Nurse Practitioners) who all work together to provide you with the care you need, when you need it.  Your next appointment:   1 year(s)  The format for your next appointment:   In Person  Provider:   Cherlynn Kaiser, MD   Signed, Elouise Munroe, MD  01/17/2021 8:00 AM    [place]

## 2021-01-30 ENCOUNTER — Other Ambulatory Visit: Payer: Self-pay | Admitting: Family Medicine

## 2021-02-01 ENCOUNTER — Emergency Department: Admit: 2021-02-01 | Payer: MEDICARE

## 2021-02-01 ENCOUNTER — Ambulatory Visit: Admit: 2021-02-01 | Discharge: 2021-02-01 | Payer: MEDICARE

## 2021-02-01 ENCOUNTER — Inpatient Hospital Stay: Admit: 2021-02-01 | Discharge: 2021-02-01 | Disposition: A | Discharge status: Home / Self Care | Payer: MEDICARE

## 2021-02-01 ENCOUNTER — Encounter

## 2021-02-01 DIAGNOSIS — M2391 Unspecified internal derangement of right knee: Secondary | ICD-10-CM

## 2021-02-01 DIAGNOSIS — S8001XA Contusion of right knee, initial encounter: Secondary | ICD-10-CM

## 2021-02-01 MED ORDER — HYDROCODONE-ACETAMINOPHEN 5-325 MG PO TABS
5-325 MG | ORAL | Status: AC
Start: 2021-02-01 — End: 2021-02-01

## 2021-02-01 MED ORDER — MORPHINE SULFATE 4 MG/ML IV SOLN
4 MG/ML | Freq: Once | INTRAVENOUS | Status: AC
Start: 2021-02-01 — End: 2021-02-01
  Administered 2021-02-01: 06:00:00 4 mg via INTRAMUSCULAR

## 2021-02-01 MED ORDER — HYDROCODONE-ACETAMINOPHEN 5-325 MG PO TABS
5-325 MG | ORAL_TABLET | Freq: Four times a day (QID) | ORAL | 0 refills | Status: AC | PRN
Start: 2021-02-01 — End: 2021-02-04

## 2021-02-01 MED ORDER — MORPHINE SULFATE 4 MG/ML IV SOLN
4 MG/ML | Freq: Once | INTRAVENOUS | Status: DC
Start: 2021-02-01 — End: 2021-02-01

## 2021-02-01 MED ORDER — ONDANSETRON 4 MG PO TBDP
4 MG | Freq: Once | ORAL | Status: AC
Start: 2021-02-01 — End: 2021-02-01
  Administered 2021-02-01: 06:00:00 4 mg via ORAL

## 2021-02-01 MED ORDER — HYDROCODONE-ACETAMINOPHEN 5-325 MG PO TABS
5-325 MG | ORAL | Status: AC
Start: 2021-02-01 — End: 2021-02-01
  Administered 2021-02-01: 08:00:00 1 via ORAL

## 2021-02-01 MED ORDER — ONDANSETRON HCL 4 MG PO TABS
4 MG | ORAL_TABLET | Freq: Three times a day (TID) | ORAL | 0 refills | Status: DC | PRN
Start: 2021-02-01 — End: 2021-04-11

## 2021-02-01 MED FILL — HYDROCODONE-ACETAMINOPHEN 5-325 MG PO TABS: 5-325 MG | ORAL | Qty: 1

## 2021-02-01 MED FILL — MORPHINE SULFATE 4 MG/ML IV SOLN: 4 mg/mL | INTRAVENOUS | Qty: 1

## 2021-02-01 MED FILL — ONDANSETRON 4 MG PO TBDP: 4 MG | ORAL | Qty: 1

## 2021-02-01 NOTE — Telephone Encounter (Signed)
Thanks!

## 2021-02-01 NOTE — Telephone Encounter (Signed)
Patient can be seen with Jarrett Soho or Claiborne Billings - tomorrow, Thursday, or Friday - or sometime next week - thank you.

## 2021-02-01 NOTE — Telephone Encounter (Signed)
Patient scheduled for today due to severe pain.

## 2021-02-01 NOTE — Other (Signed)
Knee immobolizer placed. Pt. Demonstrated understanding in crutch walking and patient and family member verbalized understanding of how to use splint.

## 2021-02-01 NOTE — Discharge Instructions (Signed)
Please apply Voltaren gel to the anterior and posterior knee (over the counter). Take Tylenol 1,000mg  every 6 hours as needed for pain. Take Norco for severe pain only (note has 325mg  tylenol- do not exceed 4,000mg  Tylenol in 24 hours). Use knee immobilizer when ambulating with crutches without weight bearing. Elevate and apply ice when at rest.

## 2021-02-01 NOTE — ED Provider Notes (Signed)
[facility] EMERGENCY DEPT  EMERGENCY DEPARTMENT ENCOUNTER      Pt Name: Lorraine Freeman  MRN: [id number]  [place] 22-Nov-1945  Date of evaluation: 02/01/2021  Provider: Marga Melnick, PA-C    CHIEF COMPLAINT       Chief Complaint   Patient presents with    Knee Injury     Pt. Tripped and fell approximately 4 hours ago onto R leg. Pain worsened since then, can no longer bear weight on R leg. Knee swelling noted. Pain 2/10 at rest, 10/10 moving.           HISTORY OF PRESENT ILLNESS    HPITrip over a large box on the ground at her house and fell directly onto the right lower extremity onto hard wood flooring. She c/o right knee pain and swelling placed in immization by EMS. Patient reports 10/10 pain with movement and 2/10 at rest. She denies head injury, LOC, neck or back pain. Denies symptoms prior to call.    Nursing Notes were reviewed.    REVIEW OF SYSTEMS     Review of Systems   Constitutional: Negative.    HENT:  Negative for dental problem and nosebleeds.    Eyes:  Negative for visual disturbance.   Respiratory: Negative.  Negative for shortness of breath.    Cardiovascular: Negative.  Negative for chest pain.   Gastrointestinal:  Negative for vomiting.   Musculoskeletal:  Negative for back pain and neck pain.   Neurological:  Negative for dizziness, syncope, speech difficulty, weakness, numbness and headaches.        No LOC   Hematological: Negative.         Does not take anticoagulants     Except as noted above the remainder of the review of systems was reviewed and negative.     PAST MEDICAL HISTORY   No past medical history on file.    SURGICAL HISTORY     No past surgical history on file.    CURRENT MEDICATIONS       Previous Medications    ACETAMINOPHEN (TYLENOL) 325 MG TABLET    1 tablet as needed Orally every 6 hrs    ALPRAZOLAM (XANAX) 0.5 MG TABLET    1 tablet Orally Q HS       ALLERGIES     Codeine, Dilaudid [hydromorphone], Flurbiprofen, and Nsaids    FAMILY HISTORY     No family history on file.      SOCIAL HISTORY       Social History     Socioeconomic History    Marital status: Married       SCREENINGS         Glasgow Coma Scale  Eye Opening: Spontaneous  Best Verbal Response: Oriented  Best Motor Response: Obeys commands  Glasgow Coma Scale Score: 15                     CIWA Assessment  BP: 135/83  Heart Rate: 68                 PHYSICAL EXAM       ED Triage Vitals [02/01/21 0022]   BP Temp Temp Source Heart Rate Resp SpO2 Height Weight   131/76 98.1 ??F (36.7 ??C) Oral 69 16 98 % -- --       Physical Exam  Vitals and nursing note reviewed.   Constitutional:       Appearance: Normal appearance.   HENT:  Head: Normocephalic and atraumatic.   Eyes:      Extraocular Movements: Extraocular movements intact.      Conjunctiva/sclera: Conjunctivae normal.      Pupils: Pupils are equal, round, and reactive to light.   Cardiovascular:      Rate and Rhythm: Normal rate and regular rhythm.      Pulses: Normal pulses.      Heart sounds: Normal heart sounds.   Pulmonary:      Effort: Pulmonary effort is normal.      Breath sounds: Normal breath sounds.   Musculoskeletal:      Cervical back: Normal range of motion and neck supple. No tenderness.      Comments: Knee immobilizer (placed by EMS) removed for examination: Right knee with moderate swelling without deformity and decreased ROM refusing to flex right knee and non-weight bearing; FROM all other extremities. Not able to assess Lachman's or McMurry's tests due to severe pain right knee.   Skin:     Comments: No laceration/abrasion. Moderate swelling anterior right knee   Neurological:      General: No focal deficit present.      Mental Status: She is alert.      Cranial Nerves: No cranial nerve deficit.      Sensory: No sensory deficit.       Procedures    DIAGNOSTIC RESULTS       RADIOLOGY:   XR FEMUR RIGHT (MIN 2 VIEWS)    (Results Pending)   XR KNEE RIGHT (3 VIEWS)    (Results Pending)   XR TIBIA FIBULA RIGHT (2 VIEWS)    (Results Pending)   No acute bony  changes preliminary review femur, knee, and tib/fibula by myself.    LABS:  Labs Reviewed - No data to display    All other labs were within normal range or not returned as of this dictation.    EMERGENCY DEPARTMENT COURSE/REASSESSMENT and MDM:   MDM  Concern for internal derangement right knee and has seen Dr. Renie Ora surgeon in the past. Leg is neurovascularly intact placed in knee immobilizer and given crutches without weight bearing. Patient written Norco for severe pain. Advised narcotic to take sparingly and no driving. Patient has not been prescribed narcotics recently and is not prescribed Benzodiazepines.       FINAL IMPRESSION      1. Knee internal derangement, right    2. Fall from standing, initial encounter          DISPOSITION/PLAN   DISPOSITION Decision To Discharge 02/01/2021 03:28:06 AM      PATIENT REFERRED TO:  Fayrene Fearing, MD  [address]  [place] [address]  [phone]    In 1 week  For futher evaluation/treatment    DISCHARGE MEDICATIONS:  New Prescriptions    HYDROCODONE-ACETAMINOPHEN (NORCO) 5-325 MG PER TABLET    Take 1 tablet by mouth every 6 hours as needed for Pain for up to 3 days. Intended supply: 3 days. Take lowest dose possible to manage pain    ONDANSETRON (ZOFRAN) 4 MG TABLET    Take 1 tablet by mouth 3 times daily as needed for Nausea or Vomiting     Controlled Substances Monitoring:     No flowsheet data found.    (Please note that portions of this note were completed with a voice recognition program.  Efforts were made to edit the dictations but occasionally words are mis-transcribed.)    Marga Melnick, PA-C (electronically  signed)  Attending Emergency Physician           Marga Melnick, PA-C  02/01/21 [phone]

## 2021-02-01 NOTE — Telephone Encounter (Signed)
Patient states she is a previous patient of Dr. Phillip Heal and he did surgery on her right knee. She fell yesterday and was seen at [place] [facility]. She is requesting to follow up with Dr. Phillip Heal as he did her previous surgery on this knee. Please call and advise patient if ok to schedule with Phillip Heal. Thank you

## 2021-02-01 NOTE — Progress Notes (Signed)
Lorraine Freeman (DOB:  March 05, 1946) is a 75 y.o. female, presents for evaluation of the following chief complaint(s):  New Patient (Right Knee DOI 01/31/21)      Subjective   HPI  [name] presents to my office for the first time with right knee pain.  On 11-14 she was walking in her home when she tripped over a box landing onto her flexed right knee and right forearm.  This event was accompanied by excruciating pain prompting her to go to the ER.  At the ER they took x-rays of her hip, femur, tibia, fibula, knee which showed no significant findings, no acute fracture or dislocation.  They gave her a prescription for Norco, knee immobilizer, and told her to follow-up with orthopedics.  The sharp pain is all throughout her knee, no 1 particular spot.  She rates the pain 10 out of 10 when she attempts to bend her knee or walk.  Some relief with rest when she is not ambulating, bearing weight.  Confirms radiating pain up into her hip as well as into her lower leg.  She denies numbness, tingling.  She confirms associated swelling, stiffness in her knee.  She is here for evaluation secondary to her significant pain.    Ht 5\' 3"  (1.6 m)    Wt 170 lb (77.1 kg)    BMI 30.11 kg/m??    Allergies   Allergen Reactions    Codeine      Other reaction(s): headache    Dilaudid [Hydromorphone]     Flurbiprofen      Other reaction(s): Unknown    Nsaids      Other reaction(s): stomach upset      Current Outpatient Medications   Medication Sig Dispense Refill    ondansetron (ZOFRAN) 4 MG tablet Take 1 tablet by mouth 3 times daily as needed for Nausea or Vomiting 15 tablet 0    HYDROcodone-acetaminophen (NORCO) 5-325 MG per tablet Take 1 tablet by mouth every 6 hours as needed for Pain for up to 3 days. Intended supply: 3 days. Take lowest dose possible to manage pain 12 tablet 0    acetaminophen (TYLENOL) 325 MG tablet 1 tablet as needed Orally every 6 hrs      ALPRAZolam (XANAX) 0.5 MG tablet 1 tablet Orally Q HS       No current  facility-administered medications for this visit.      History reviewed. No pertinent past medical history.   History reviewed. No pertinent surgical history.   History reviewed. No pertinent family history.   Social History     Occupational History    Not on file   Tobacco Use    Smoking status: Never    Smokeless tobacco: Never   Substance and Sexual Activity    Alcohol use: Never    Drug use: Never    Sexual activity: Not on file        Review of Systems:  Constitutional:  Negative for activity change.   HENT:  Negative for congestion and trouble swallowing.    Respiratory:  Negative for shortness of breath.  negative Obstructive sleep apnea.  Cardiovascular:  Negative for chest pain. negative Blood thinners. negative hypertension.   Gastrointestinal:  Negative for abdominal pain.   Endocrine: Negative for heat or cold intolerance. negative Diabetes mellitus.   Skin:  Negative for rash.  Allergic/Immunologic: Negative for immunocompromised state.   Neurological:  Negative for dizziness.   Psychiatric/Behavioral:  Negative for behavioral problems.  Objective   Physical Exam  Right knee:   moderate effusion, no deformity, no erythema, no ecchymosis.   painful range of motion, limited active and passive ROM, no crepitus.  mild varus alignment and normal patellar mobility. 2+ pulses throughout.   No obvious fracture or dislocation.  positive Medial joint line tenderness, positive lateral joint line tenderness,positive peripatellar tenderness.  Instability Tests: negative Anterior drawer test. positive McMurray test.     Left knee:   no effusion, no deformity, no erythema, no ecchymosis.  painless range of motion, full active and passive ROM, no crepitus.   normal alignment, normal patellar mobility. 2+ pulses throughout.   No obvious fracture or dislocation.  negative Medial joint line tenderness, negative lateral joint line tenderness, negative peripatellar tenderness.  Instability Tests: negative Anterior  drawer test. negative McMurray test.        Radiology:  I interpreted her outside x-rays from the ER, no obvious acute fracture and her hip or knee.  Mild varus deformity in her knee with diffuse osteoarthritis.  No dislocation.  Moderate osteoarthritis in her right hip.  Reviewed and discussed interpretations and findings with patient.          ASSESSMENT/PLAN:    ICD-10-CM    1. Patellar contusion, right, initial encounter  S80.01XA MRI KNEE RIGHT WO CONTRAST     [facility] -  DURABLE MEDICAL EQUIPMENT      2. Effusion of right knee  M25.461          [name] is suffering from right knee pain secondary to suspected tibial and patellar contusion with possible plateau fracture.  She has diffuse tenderness all throughout her knee so I am unsure of what soft tissue injury she may have but I have not excluded meniscal injury, PCL tear.  After  a very thorough discussion of alternative treatment options including physical therapy, injection therapy, and further imaging, we decided to proceed in ordering a MRI at [place] today.  In the meantime take Tylenol, I did recommend she take 1 81mg  aspirin per day for DVT prophylaxis.    I am ordering a standard wheelchair for my patient. Their right knee pain and possible fracture interferes with MRADLs in the home. They have the upper body strength to propel a standard wheelchair. They are unable to ambulate with a cane or walker because of upper body weakness. She may weight bear for transfers however I would like her to minimize her ambulation beyond this. Discussed adverse effects of medications. Instructed to call if they have any questions or concerns, follow up after MRI with Dr. Phillip Heal.       An electronic signature was used to authenticate this note.    --Kari Baars, PA-C

## 2021-02-02 NOTE — Telephone Encounter (Signed)
Patient has MRI this Friday and MRI follow up on 03/11/21 but is requesting to be seen sooner due to extreme pain and cannot walk. Also is in need of a refill of her pain medication as she will be out tomorrow. Please call and advise if appt can be moved up and refill can be done. Thank you

## 2021-02-02 NOTE — Telephone Encounter (Signed)
Returned call - patient is scheduled for next week, Wednesday to go over MRI results. She has a wheelchair and some pain meds. We talked about Tylenol PM as well.

## 2021-02-03 DIAGNOSIS — H35363 Drusen (degenerative) of macula, bilateral: Secondary | ICD-10-CM | POA: Diagnosis not present

## 2021-02-03 DIAGNOSIS — H524 Presbyopia: Secondary | ICD-10-CM | POA: Diagnosis not present

## 2021-02-03 DIAGNOSIS — H2513 Age-related nuclear cataract, bilateral: Secondary | ICD-10-CM | POA: Diagnosis not present

## 2021-02-03 DIAGNOSIS — H04123 Dry eye syndrome of bilateral lacrimal glands: Secondary | ICD-10-CM | POA: Diagnosis not present

## 2021-02-03 DIAGNOSIS — R519 Headache, unspecified: Secondary | ICD-10-CM | POA: Diagnosis not present

## 2021-02-04 ENCOUNTER — Inpatient Hospital Stay: Admit: 2021-02-04 | Payer: MEDICARE

## 2021-02-04 DIAGNOSIS — M1711 Unilateral primary osteoarthritis, right knee: Secondary | ICD-10-CM

## 2021-02-09 ENCOUNTER — Ambulatory Visit: Admit: 2021-02-09 | Discharge: 2021-02-09 | Payer: MEDICARE | Attending: Sports Medicine

## 2021-02-09 DIAGNOSIS — M1711 Unilateral primary osteoarthritis, right knee: Secondary | ICD-10-CM

## 2021-02-09 NOTE — Progress Notes (Signed)
Chief Complaint   Patient presents with    Follow-up     Follow Up: MRI Review: Right Knee        HPI  Lorraine Freeman (DOB:  1945-04-21) is a 75 y.o. female, seen today for evaluation of her right knee.  She continues to have discomfort and presents today with a wheelchair.  Recent MRI scan demonstrated diffuse degenerative change with probable posterior horn tear medial meniscus.  Seen today for assessment and to consider additional treatment options.      Allergies   Allergen Reactions    Levofloxacin Other (See Comments) and Palpitations     Event:        Flurbiprofen      Other reaction(s): Unknown    Nsaids      Other reaction(s): stomach upset      Current Outpatient Medications   Medication Sig Dispense Refill    albuterol sulfate HFA (VENTOLIN HFA) 108 (90 Base) MCG/ACT inhaler Ventolin HFA 90 mcg/actuation aerosol inhaler      vitamin D3 (CHOLECALCIFEROL) 125 MCG (5000 UT) TABS tablet   5,000 units = tabs, Oral, Daily, # 100 tabs, 0 Refill(s)      calcium carbonate 1500 (600 Ca) MG TABS tablet 600 mg      ALPRAZolam (XANAX) 0.25 MG tablet TAKE 1 TABLET BY MOUTH TWICE DAILY      omeprazole (PRILOSEC) 40 MG delayed release capsule TAKE 1 CAPSULE BY MOUTH ONCE DAILY      tiZANidine (ZANAFLEX) 4 MG tablet       ondansetron (ZOFRAN) 4 MG tablet Take 1 tablet by mouth 3 times daily as needed for Nausea or Vomiting 15 tablet 0    acetaminophen (TYLENOL) 325 MG tablet 1 tablet as needed Orally every 6 hrs       No current facility-administered medications for this visit.      History reviewed. No pertinent past medical history.   History reviewed. No pertinent surgical history.   History reviewed. No pertinent family history.   Social History     Occupational History    Not on file   Tobacco Use    Smoking status: Never    Smokeless tobacco: Never   Substance and Sexual Activity    Alcohol use: Never    Drug use: Never    Sexual activity: Not on file        Review of Systems:  Constitutional:  Negative for  activity change.   HENT:  Negative for congestion and trouble swallowing.    Respiratory:  Negative for shortness of breath.  negative Obstructive sleep apnea.  Cardiovascular:  Negative for chest pain. negative Blood thinners. negative hypertension.   Gastrointestinal:  Negative for abdominal pain.   Endocrine: Negative for heat or cold intolerance. negative Diabetes mellitus.   Skin:  Negative for rash.  Allergic/Immunologic: Negative for immunocompromised state.   Neurological:  Negative for dizziness.   Psychiatric/Behavioral:  Negative for behavioral problems.          Objective   Ortho Exam   Right knee:   Mild effusion, no deformity, no erythema, no ecchymosis.  Painful range of motion, full active and passive ROM, moderate crepitus.   Varus alignment, normal patellar mobility. 2+ pulses throughout.   No obvious fracture or dislocation.  Positive medial joint line tenderness, negative lateral joint line tenderness, negative peripatellar tenderness.  Instability Tests: negative anterior drawer test. Negative McMurray test.      Left knee:   Mild effusion, no  deformity, no erythema, no ecchymosis.  Painless range of motion, full active and passive ROM, moderate crepitus.   Varus alignment, normal patellar mobility. 2+ pulses throughout.   No obvious fracture or dislocation.  Positive medial joint line tenderness, negative lateral joint line tenderness, negative peripatellar tenderness.  Instability Tests: negative anterior drawer test. Negative McMurray test.            Side Right.    After discussing with the patient the benefits and risks of intraarticular cortisone injection, the right knee was prepped under sterile condition with ChloraPrep. Ethyl Chloride was sprayed on the injection site to numb the skin. After the injection site was appropriately numb I then injected .5% 3cc Bupivicaine and 80mg  Depo Medrol into the intra-articular space of the involved knee. The injection site was sterily dressed.    Post- Procedure: The patient tolerated the procedure well, no complications. The patient has been instructed on post-procedure care.                    ASSESSMENT/PLAN:      ICD-10-CM    1. Primary osteoarthritis of right knee  M17.11       2. Complex tear of medial meniscus of right knee, subsequent encounter  S83.231D          1. Primary osteoarthritis of right knee  2. Complex tear of medial meniscus of right knee, subsequent encounter    Alternatives for further treatment were discussed.  Injection right knee today.Reddie brace, walker for ambulation and home exercise program.  Follow-up in 6 to 8 weeks for further concerns.      An electronic signature was used to authenticate this note.    --Fayrene Fearing, MD

## 2021-02-12 ENCOUNTER — Other Ambulatory Visit: Payer: Self-pay | Admitting: Family Medicine

## 2021-02-18 ENCOUNTER — Encounter: Payer: Self-pay | Admitting: Family

## 2021-02-18 ENCOUNTER — Ambulatory Visit: Payer: Medicare PPO | Admitting: Family

## 2021-02-18 ENCOUNTER — Other Ambulatory Visit: Payer: Self-pay

## 2021-02-18 VITALS — BP 144/80 | HR 76 | Temp 97.8°F | Ht 65.0 in | Wt 165.2 lb

## 2021-02-18 DIAGNOSIS — N3090 Cystitis, unspecified without hematuria: Secondary | ICD-10-CM | POA: Diagnosis not present

## 2021-02-18 LAB — POCT URINALYSIS DIPSTICK
Blood, UA: POSITIVE
Glucose, UA: NEGATIVE
Ketones, UA: NEGATIVE
Nitrite, UA: NEGATIVE
Protein, UA: POSITIVE — AB
Spec Grav, UA: >=1.03 — AB (ref 1.010–1.025)
Urobilinogen, UA: 1 E.U./dL
pH, UA: 6 (ref 5.0–8.0)

## 2021-02-18 MED ORDER — CIPROFLOXACIN HCL 500 MG PO TABS: 500.0000 mg | ORAL_TABLET | Freq: Two times a day (BID) | ORAL | 0 refills | Status: AC

## 2021-02-18 NOTE — Progress Notes (Signed)
Subjective:     Patient ID: Maria Schneider, female    DOB: 1946/01/18, 75 y.o.   MRN: [id number]  Chief Complaint  Patient presents with   Urinary Frequency   Spasms    Sensation after urination.     HPI Urinary symptoms: Patient c/o  frequency, urgency, pelvic pain. Other sx: vaginal d/c no, vaginal itching no. Duration of sx: 3 days; Home tx: none; Denies  nausea, fever. Reports last UTI over a year ago.    Health Maintenance Due  Topic Date Due   Zoster Vaccines- Shingrix (1 of 2) Never done   Pneumonia Vaccine 2+ Years old (24 - PPSV23 if available, else PCV20) 02/23/2017   INFLUENZA VACCINE  10/18/2020    Past Medical History:  Diagnosis Date   Allergy    Arthritis    Asthma    Cancer ([place])    hx skin cancer   Depression    Detrusor instability    Diverticulosis    not symptomatic   GERD (gastroesophageal reflux disease)    History of kidney stones    History of pancreatitis    April 2015   History of skin cancer    Hyperlipidemia    Hypertension    Osteopenia    Urinary incontinence    Vitamin D deficiency    RESOLVED     Past Surgical History:  Procedure Laterality Date   ABDOMINAL HYSTERECTOMY  1994   TAH/ BLADDER SUSPENSION   BREAST BIOPSY  1972   benign   CHOLECYSTECTOMY N/A 07/12/2013   Procedure: LAPAROSCOPIC CHOLECYSTECTOMY WITH INTRAOPERATIVE CHOLANGIOGRAM;  Surgeon: Edward Jolly, MD;  Location: [facility];  Service: General;  Laterality: N/A;   COLONOSCOPY  06-24-12   per Dr. Earlean Shawl, benign polyps, repeat in 5 yrs     ERCP N/A 07/14/2013   Procedure: ENDOSCOPIC RETROGRADE CHOLANGIOPANCREATOGRAPHY (ERCP);  Surgeon: Gatha Mayer, MD;  Location: [facility];  Service: Endoscopy;  Laterality: N/A;   TONSILLECTOMY     TOTAL HIP ARTHROPLASTY Right 01/13/2015   Procedure: RIGHT TOTAL HIP ARTHROPLASTY ANTERIOR APPROACH;  Surgeon: Gaynelle Arabian, MD;  Location: [facility];  Service: Orthopedics;  Laterality: Right;   TOTAL HIP ARTHROPLASTY Left  11/07/2017   Procedure: LEFT TOTAL HIP ARTHROPLASTY ANTERIOR APPROACH;  Surgeon: Gaynelle Arabian, MD;  Location: [facility];  Service: Orthopedics;  Laterality: Left;   TUBAL LIGATION      Outpatient Medications Prior to Visit  Medication Sig Dispense Refill   aspirin EC 81 MG tablet Take 1 tablet (81 mg total) by mouth daily. Swallow whole. 90 tablet 3   buPROPion (WELLBUTRIN XL) 300 MG 24 hr tablet Take 300 mg by mouth daily.     Cholecalciferol (VITAMIN D3) 25 MCG (1000 UT) CAPS Take 5,000 Units by mouth daily.      esomeprazole (NEXIUM) 20 MG capsule Take 20 mg by mouth daily.      ezetimibe (ZETIA) 10 MG tablet TAKE 1 TABLET BY MOUTH EVERY DAY 90 tablet 3   fenofibrate 160 MG tablet TAKE 1 TABLET BY MOUTH EVERY DAY 90 tablet 0   metoprolol succinate (TOPROL-XL) 25 MG 24 hr tablet TAKE 1 TABLET (25 MG TOTAL) BY MOUTH DAILY. 90 tablet 0   MYRBETRIQ 50 MG TB24 tablet Take 50 mg by mouth daily.  11   REPATHA SURECLICK 536 MG/ML SOAJ INJECT 140 MG INTO THE SKIN EVERY 14 (FOURTEEN) DAYS. 140 mL 8   sertraline (ZOLOFT) 100 MG tablet Take 200 mg by mouth daily.  valsartan (DIOVAN) 80 MG tablet TAKE 1 TABLET BY MOUTH EVERY DAY 90 tablet 1   No facility-administered medications prior to visit.    Allergies  Allergen Reactions   Ace Inhibitors     REACTION: cough   Codeine Nausea And Vomiting   Fentanyl Itching   Statins     REACTION: MYALGIAS-muscle pain        Objective:    Physical Exam Vitals and nursing note reviewed.  Constitutional:      Appearance: Normal appearance.  Cardiovascular:     Rate and Rhythm: Normal rate and regular rhythm.  Pulmonary:     Effort: Pulmonary effort is normal.     Breath sounds: Normal breath sounds.  Musculoskeletal:        General: Normal range of motion.  Skin:    General: Skin is warm and dry.  Neurological:     Mental Status: She is alert.  Psychiatric:        Mood and Affect: Mood normal.        Behavior: Behavior normal.    BP  (!) 144/80   Pulse 76   Temp 97.8 F (36.6 C) (Temporal)   Ht 5\' 5"  (1.651 m)   Wt 165 lb 3.2 oz (74.9 kg)   SpO2 96%   BMI 27.49 kg/m  Wt Readings from Last 3 Encounters:  02/18/21 165 lb 3.2 oz (74.9 kg)  01/17/21 163 lb (73.9 kg)  11/30/20 163 lb (73.9 kg)       Assessment & Plan:   Problem List Items Addressed This Visit       Genitourinary   Cystitis - Primary   Relevant Medications   ciprofloxacin (CIPRO) 500 MG tablet   Other Relevant Orders   POCT Urinalysis Dipstick (Completed)   Urine Culture    Meds ordered this encounter  Medications   ciprofloxacin (CIPRO) 500 MG tablet    Sig: Take 1 tablet (500 mg total) by mouth 2 (two) times daily for 5 days.    Dispense:  10 tablet    Refill:  0    Order Specific Question:   Supervising Provider    Answer:   ANDY, CAMILLE L [[id number]]

## 2021-02-20 LAB — URINE CULTURE
MICRO NUMBER:: 12707314
SPECIMEN QUALITY:: ADEQUATE

## 2021-02-24 ENCOUNTER — Inpatient Hospital Stay: Admit: 2021-02-24 | Payer: MEDICARE | Primary: Internal Medicine

## 2021-02-24 DIAGNOSIS — Z1231 Encounter for screening mammogram for malignant neoplasm of breast: Secondary | ICD-10-CM

## 2021-02-24 DIAGNOSIS — N958 Other specified menopausal and perimenopausal disorders: Secondary | ICD-10-CM

## 2021-03-30 ENCOUNTER — Ambulatory Visit (INDEPENDENT_AMBULATORY_CARE_PROVIDER_SITE_OTHER): Payer: Medicare PPO

## 2021-03-30 ENCOUNTER — Ambulatory Visit: Payer: Medicare PPO

## 2021-03-30 VITALS — BP 128/79 | HR 64 | Ht 65.0 in | Wt 165.0 lb

## 2021-03-30 DIAGNOSIS — Z0000 Encounter for general adult medical examination without abnormal findings: Secondary | ICD-10-CM

## 2021-03-30 NOTE — Patient Instructions (Signed)
Maria Schneider , Thank you for taking time to come for your Medicare Wellness Visit. I appreciate your ongoing commitment to your health goals. Please review the following plan we discussed and let me know if I can assist you in the future.   These are the goals we discussed:  Goals      Patient Stated     Patient states she want to lose weight. Started walking 3x per wk (20 min)       Advanced directives: Yes  Conditions/risks identified: None  Next appointment: Follow up in one year for your annual wellness visit    Preventive Care 65 Years and Older, Female Preventive care refers to lifestyle choices and visits with your health care provider that can promote health and wellness. What does preventive care include? A yearly physical exam. This is also called an annual well check. Dental exams once or twice a year. Routine eye exams. Ask your health care provider how often you should have your eyes checked. Personal lifestyle choices, including: Daily care of your teeth and gums. Regular physical activity. Eating a healthy diet. Avoiding tobacco and drug use. Limiting alcohol use. Practicing safe sex. Taking low-dose aspirin every day. Taking vitamin and mineral supplements as recommended by your health care provider. What happens during an annual well check? The services and screenings done by your health care provider during your annual well check will depend on your age, overall health, lifestyle risk factors, and family history of disease. Counseling  Your health care provider may ask you questions about your: Alcohol use. Tobacco use. Drug use. Emotional well-being. Home and relationship well-being. Sexual activity. Eating habits. History of falls. Memory and ability to understand (cognition). Work and work Statistician. Reproductive health. Screening  You may have the following tests or measurements: Height, weight, and BMI. Blood pressure. Lipid and  cholesterol levels. These may be checked every 5 years, or more frequently if you are over 66 years old. Skin check. Lung cancer screening. You may have this screening every year starting at age 20 if you have a 30-pack-year history of smoking and currently smoke or have quit within the past 15 years. Fecal occult blood test (FOBT) of the stool. You may have this test every year starting at age 40. Flexible sigmoidoscopy or colonoscopy. You may have a sigmoidoscopy every 5 years or a colonoscopy every 10 years starting at age 62. Hepatitis C blood test. Hepatitis B blood test. Sexually transmitted disease (STD) testing. Diabetes screening. This is done by checking your blood sugar (glucose) after you have not eaten for a while (fasting). You may have this done every 1-3 years. Bone density scan. This is done to screen for osteoporosis. You may have this done starting at age 59. Mammogram. This may be done every 1-2 years. Talk to your health care provider about how often you should have regular mammograms. Talk with your health care provider about your test results, treatment options, and if necessary, the need for more tests. Vaccines  Your health care provider may recommend certain vaccines, such as: Influenza vaccine. This is recommended every year. Tetanus, diphtheria, and acellular pertussis (Tdap, Td) vaccine. You may need a Td booster every 10 years. Zoster vaccine. You may need this after age 34. Pneumococcal 13-valent conjugate (PCV13) vaccine. One dose is recommended after age 42. Pneumococcal polysaccharide (PPSV23) vaccine. One dose is recommended after age 27. Talk to your health care provider about which screenings and vaccines you need and how often you  need them. This information is not intended to replace advice given to you by your health care provider. Make sure you discuss any questions you have with your health care provider. Document Released: 04/02/2015 Document Revised:  11/24/2015 Document Reviewed: 01/05/2015 Elsevier Interactive Patient Education  2017 [place] Prevention in the Home Falls can cause injuries. They can happen to people of all ages. There are many things you can do to make your home safe and to help prevent falls. What can I do on the outside of my home? Regularly fix the edges of walkways and driveways and fix any cracks. Remove anything that might make you trip as you walk through a door, such as a raised step or threshold. Trim any bushes or trees on the path to your home. Use bright outdoor lighting. Clear any walking paths of anything that might make someone trip, such as rocks or tools. Regularly check to see if handrails are loose or broken. Make sure that both sides of any steps have handrails. Any raised decks and porches should have guardrails on the edges. Have any leaves, snow, or ice cleared regularly. Use sand or salt on walking paths during winter. Clean up any spills in your garage right away. This includes oil or grease spills. What can I do in the bathroom? Use night lights. Install grab bars by the toilet and in the tub and shower. Do not use towel bars as grab bars. Use non-skid mats or decals in the tub or shower. If you need to sit down in the shower, use a plastic, non-slip stool. Keep the floor dry. Clean up any water that spills on the floor as soon as it happens. Remove soap buildup in the tub or shower regularly. Attach bath mats securely with double-sided non-slip rug tape. Do not have throw rugs and other things on the floor that can make you trip. What can I do in the bedroom? Use night lights. Make sure that you have a light by your bed that is easy to reach. Do not use any sheets or blankets that are too big for your bed. They should not hang down onto the floor. Have a firm chair that has side arms. You can use this for support while you get dressed. Do not have throw rugs and other things  on the floor that can make you trip. What can I do in the kitchen? Clean up any spills right away. Avoid walking on wet floors. Keep items that you use a lot in easy-to-reach places. If you need to reach something above you, use a strong step stool that has a grab bar. Keep electrical cords out of the way. Do not use floor polish or wax that makes floors slippery. If you must use wax, use non-skid floor wax. Do not have throw rugs and other things on the floor that can make you trip. What can I do with my stairs? Do not leave any items on the stairs. Make sure that there are handrails on both sides of the stairs and use them. Fix handrails that are broken or loose. Make sure that handrails are as long as the stairways. Check any carpeting to make sure that it is firmly attached to the stairs. Fix any carpet that is loose or worn. Avoid having throw rugs at the top or bottom of the stairs. If you do have throw rugs, attach them to the floor with carpet tape. Make sure that you have a light switch  at the top of the stairs and the bottom of the stairs. If you do not have them, ask someone to add them for you. What else can I do to help prevent falls? Wear shoes that: Do not have high heels. Have rubber bottoms. Are comfortable and fit you well. Are closed at the toe. Do not wear sandals. If you use a stepladder: Make sure that it is fully opened. Do not climb a closed stepladder. Make sure that both sides of the stepladder are locked into place. Ask someone to hold it for you, if possible. Clearly mark and make sure that you can see: Any grab bars or handrails. First and last steps. Where the edge of each step is. Use tools that help you move around (mobility aids) if they are needed. These include: Canes. Walkers. Scooters. Crutches. Turn on the lights when you go into a dark area. Replace any light bulbs as soon as they burn out. Set up your furniture so you have a clear path. Avoid  moving your furniture around. If any of your floors are uneven, fix them. If there are any pets around you, be aware of where they are. Review your medicines with your doctor. Some medicines can make you feel dizzy. This can increase your chance of falling. Ask your doctor what other things that you can do to help prevent falls. This information is not intended to replace advice given to you by your health care provider. Make sure you discuss any questions you have with your health care provider. Document Released: 12/31/2008 Document Revised: 08/12/2015 Document Reviewed: 04/10/2014 Elsevier Interactive Patient Education  2017 [employer].  This is a list of the screening recommended for you and due dates:  Health Maintenance  Topic Date Due   Pneumonia Vaccine (3 - PPSV23 if available, else PCV20) 02/23/2017   Zoster (Shingles) Vaccine (1 of 2) 06/28/2021*   Mammogram  10/28/2021   Tetanus Vaccine  01/04/2028   Colon Cancer Screening  01/26/2030   Flu Shot  Completed   DEXA scan (bone density measurement)  Completed   COVID-19 Vaccine  Completed   Hepatitis C Screening: USPSTF Recommendation to screen - Ages 10-79 yo.  Completed   HPV Vaccine  Aged Out   Pap Smear  Discontinued  *Topic was postponed. The date shown is not the original due date.

## 2021-03-30 NOTE — Progress Notes (Signed)
Subjective:   Maria Schneider is a 76 y.o. female who presents for Medicare Annual (Subsequent) preventive examination.  Review of Systems    No ROS Cardiac Risk Factors include: advanced age (>29men, >12 women);hypertension    Objective:    Today's Vitals   03/30/21 1038  BP: 128/79  Pulse: 64  Weight: 165 lb (74.8 kg)  Height: 5\' 5"  (1.651 m)   Body mass index is 27.46 kg/m.  Advanced Directives 03/30/2021 03/24/2020 10/09/2019 03/05/2019 08/20/2018 11/09/2017 11/07/2017  Does Patient Have a Medical Advance Directive? Yes Yes Yes Yes No - Yes  Type of Paramedic of [place];Living will [place];Living will [place];Living will - - Special educational needs teacher of [place];Living will  Does patient want to make changes to medical advance directive? No - Patient declined - - - - No - Patient declined No - Patient declined  Copy of [place] in Chart? No - copy requested No - copy requested - - - Yes Yes  Would patient like information on creating a medical advance directive? - - - - No - Patient declined - -    Current Medications (verified) Outpatient Encounter Medications as of 03/30/2021  Medication Sig   aspirin EC 81 MG tablet Take 1 tablet (81 mg total) by mouth daily. Swallow whole.   buPROPion (WELLBUTRIN XL) 300 MG 24 hr tablet Take 300 mg by mouth daily.   Cholecalciferol (VITAMIN D3) 25 MCG (1000 UT) CAPS Take 5,000 Units by mouth daily.    esomeprazole (NEXIUM) 20 MG capsule Take 20 mg by mouth daily.    ezetimibe (ZETIA) 10 MG tablet TAKE 1 TABLET BY MOUTH EVERY DAY   fenofibrate 160 MG tablet TAKE 1 TABLET BY MOUTH EVERY DAY   metoprolol succinate (TOPROL-XL) 25 MG 24 hr tablet TAKE 1 TABLET (25 MG TOTAL) BY MOUTH DAILY.   MYRBETRIQ 50 MG TB24 tablet Take 50 mg by mouth daily.   REPATHA SURECLICK 062 MG/ML SOAJ INJECT 140 MG INTO THE SKIN EVERY 14 (FOURTEEN) DAYS.    sertraline (ZOLOFT) 100 MG tablet Take 200 mg by mouth daily.   valsartan (DIOVAN) 80 MG tablet TAKE 1 TABLET BY MOUTH EVERY DAY   No facility-administered encounter medications on file as of 03/30/2021.    Allergies (verified) Ace inhibitors, Codeine, Fentanyl, and Statins   History: Past Medical History:  Diagnosis Date   Allergy    Arthritis    Asthma    Cancer ([place])    hx skin cancer   Depression    Detrusor instability    Diverticulosis    not symptomatic   GERD (gastroesophageal reflux disease)    History of kidney stones    History of pancreatitis    April 2015   History of skin cancer    Hyperlipidemia    Hypertension    Osteopenia    Urinary incontinence    Vitamin D deficiency    RESOLVED    Past Surgical History:  Procedure Laterality Date   ABDOMINAL HYSTERECTOMY  1994   TAH/ BLADDER SUSPENSION   BREAST BIOPSY  1972   benign   CHOLECYSTECTOMY N/A 07/12/2013   Procedure: LAPAROSCOPIC CHOLECYSTECTOMY WITH INTRAOPERATIVE CHOLANGIOGRAM;  Surgeon: Edward Jolly, MD;  Location: [facility];  Service: General;  Laterality: N/A;   COLONOSCOPY  06-24-12   per Dr. Earlean Shawl, benign polyps, repeat in 5 yrs     ERCP N/A 07/14/2013   Procedure: ENDOSCOPIC  RETROGRADE CHOLANGIOPANCREATOGRAPHY (ERCP);  Surgeon: Gatha Mayer, MD;  Location: [facility];  Service: Endoscopy;  Laterality: N/A;   TONSILLECTOMY     TOTAL HIP ARTHROPLASTY Right 01/13/2015   Procedure: RIGHT TOTAL HIP ARTHROPLASTY ANTERIOR APPROACH;  Surgeon: Gaynelle Arabian, MD;  Location: [facility];  Service: Orthopedics;  Laterality: Right;   TOTAL HIP ARTHROPLASTY Left 11/07/2017   Procedure: LEFT TOTAL HIP ARTHROPLASTY ANTERIOR APPROACH;  Surgeon: Gaynelle Arabian, MD;  Location: [facility];  Service: Orthopedics;  Laterality: Left;   TUBAL LIGATION     Family History  Problem Relation Age of Onset   Hypertension Mother    Osteoporosis Mother    Heart disease Father    Osteoporosis Sister    Breast cancer Paternal  34        Age 52's   Diabetes Paternal Uncle    Social History   Socioeconomic History   Marital status: Married    Spouse name: Not on file   Number of children: 1   Years of education: 16   Highest education level: Not on file  Occupational History   Occupation: retired  Tobacco Use   Smoking status: Former    Years: 42.00    Types: Cigarettes    Quit date: 07/09/2008    Years since quitting: 12.7   Smokeless tobacco: Never   Tobacco comments:    9 years ago  Vaping Use   Vaping Use: Never used  Substance and Sexual Activity   Alcohol use: Yes    Alcohol/week: 2.0 standard drinks    Types: 2 Glasses of wine per week    Comment: OCC   Drug use: No   Sexual activity: Never    Birth control/protection: Surgical  Other Topics Concern   Not on file  Social History Narrative   Gets regular exercise   Right handed   Two story home   No caffeine      Social Determinants of Health   Financial Resource Strain: Low Risk    Difficulty of Paying Living Expenses: Not hard at all  Food Insecurity: No Food Insecurity   Worried About Charity fundraiser in the Last Year: Never true   Ran Out of Food in the Last Year: Never true  Transportation Needs: No Transportation Needs   Lack of Transportation (Medical): No   Lack of Transportation (Non-Medical): No  Physical Activity: Insufficiently Active   Days of Exercise per Week: 3 days   Minutes of Exercise per Session: 20 min  Stress: No Stress Concern Present   Feeling of Stress : Not at all  Social Connections: Socially Integrated   Frequency of Communication with Friends and Family: More than three times a week   Frequency of Social Gatherings with Friends and Family: More than three times a week   Attends Religious Services: More than 4 times per year   Active Member of [employer] or Organizations: Yes   Attends Music therapist: More than 4 times per year   Marital Status: Married     Clinical  Intake:   Diabetic? No  Interpreter Needed?: No Activities of Daily Living In your present state of health, do you have any difficulty performing the following activities: 03/30/2021  Hearing? N  Vision? N  Difficulty concentrating or making decisions? N  Walking or climbing stairs? N  Dressing or bathing? N  Doing errands, shopping? N  Do you have problems with loss of bowel control? N  Managing your Medications? N  Managing your Finances? N  Housekeeping or managing your Housekeeping? N  Some recent data might be hidden    Patient Care Team: Laurey Morale, MD as PCP - General Elouise Munroe, MD as PCP - Cardiology (Cardiology) Pieter Partridge, DO as Consulting Physician (Neurology)  Indicate any recent Medical Services you may have received from other than [facility] providers in the past year (date may be approximate).     Assessment:   This is a routine wellness examination for Lolita.  Virtual Visit via Telephone Note  I connected with  Rex Kras on 03/30/21 at 10:30 AM EST by telephone and verified that I am speaking with the correct person using two identifiers.  Location: Patient: Home Provider: Office Persons participating in the virtual visit: patient/Nurse Health Advisor   I discussed the limitations, risks, security and privacy concerns of performing an evaluation and management service by telephone and the availability of in person appointments. The patient expressed understanding and agreed to proceed.  Interactive audio and video telecommunications were attempted between this nurse and patient, however failed, due to patient having technical difficulties OR patient did not have access to video capability.  We continued and completed visit with audio only.  Some vital signs may be absent or patient reported.   Criselda Peaches, LPN   Hearing/Vision screen Hearing Screening - Comments:: No difficulty hearing Vision Screening - Comments:: Wears glasses  and contacts. Followed by Dr Herbert Deaner  Dietary issues and exercise activities discussed: Current Exercise Habits: Home exercise routine, Type of exercise: walking, Time (Minutes): 20, Frequency (Times/Week): 3, Weekly Exercise (Minutes/Week): 60, Intensity: Moderate   Goals Addressed             This Visit's Progress    Patient Stated       Patient states she want to lose weight. Started walking 3x per wk (20 min)       Depression Screen PHQ 2/9 Scores 03/30/2021 03/24/2020 04/22/2014  PHQ - 2 Score 0 1 0    Fall Risk Fall Risk  03/30/2021 03/24/2020 03/05/2019 05/21/2017 04/17/2016  Falls in the past year? 0 0 1 No No  Number falls in past yr: 0 0 0 - -  Injury with Fall? 0 0 1 - -  Risk for fall due to : - Impaired vision;Impaired balance/gait - - -  Follow up - Falls prevention discussed - - -    FALL RISK PREVENTION PERTAINING TO THE HOME:  Any stairs in or around the home? Yes  If so, are there any without handrails? No  Home free of loose throw rugs in walkways, pet beds, electrical cords, etc? Yes  Adequate lighting in your home to reduce risk of falls? Yes   ASSISTIVE DEVICES UTILIZED TO PREVENT FALLS:  Life alert? No  Use of a cane, walker or w/c? No  Grab bars in the bathroom? Yes  Shower chair or bench in shower? Yes  Elevated toilet seat or a handicapped toilet? Yes   TIMED UP AND GO:  Was the test performed? No . Audio Visit  Cognitive Function:     6CIT Screen 03/30/2021 03/24/2020  What Year? 0 points 0 points  What month? 0 points 0 points  What time? 0 points -  Count back from 20 0 points 0 points  Months in reverse 0 points 0 points  Repeat phrase 0 points 0 points  Total Score 0 -    Immunizations Immunization History  Administered Date(s) Administered  Fluad Quad(high Dose 65+) 12/21/2018, 01/18/2021   Influenza Split 01/10/2011, 12/14/2011   Influenza, High Dose Seasonal PF 01/16/2013, 12/22/2014, 01/20/2016, 01/03/2018   Influenza,inj,Quad  PF,6+ Mos 12/24/2013   Influenza,inj,quad, With Preservative 03/06/2017   Influenza-Unspecified 01/02/2017   PFIZER(Purple Top)SARS-COV-2 Vaccination 04/12/2019, 05/03/2019, 12/15/2019, 08/23/2020   Pneumococcal Conjugate-13 02/24/2016   Pneumococcal Polysaccharide-23 01/01/2009   Tdap 01/03/2018   Zoster, Live 11/14/2010    Flu Vaccine status: Up to date  Pneumococcal vaccine status: Due, Education has been provided regarding the importance of this vaccine. Advised may receive this vaccine at local pharmacy or Health Dept. Aware to provide a copy of the vaccination record if obtained from local pharmacy or Health Dept. Verbalized acceptance and understanding.    Qualifies for Shingles Vaccine? Yes   Zostavax completed No   Shingrix Completed?: No.    Education has been provided regarding the importance of this vaccine. Patient has been advised to call insurance company to determine out of pocket expense if they have not yet received this vaccine. Advised may also receive vaccine at local pharmacy or Health Dept. Verbalized acceptance and understanding.  Screening Tests Health Maintenance  Topic Date Due   Zoster Vaccines- Shingrix (1 of 2) 06/28/2021 (Originally 10/26/1997)   Pneumonia Vaccine 42+ Years old (45 - PPSV23 if available, else PCV20) 03/30/2022 (Originally 02/23/2017)   MAMMOGRAM  10/28/2021   TETANUS/TDAP  01/04/2028   COLONOSCOPY (Pts 45-62yrs Insurance coverage will need to be confirmed)  01/26/2030   INFLUENZA VACCINE  Completed   DEXA SCAN  Completed   COVID-19 Vaccine  Completed   Hepatitis C Screening  Completed   HPV VACCINES  Aged Out   PAP SMEAR-Modifier  Discontinued    Health Maintenance  There are no preventive care reminders to display for this patient.  Additional Screening:  Vision Screening: Recommended annual ophthalmology exams for early detection of glaucoma and other disorders of the eye. Is the patient up to date with their annual eye exam?   Yes  Who is the provider or what is the name of the office in which the patient attends annual eye exams? Followed by Dr Herbert Deaner .   Dental Screening: Recommended annual dental exams for proper oral hygiene  Community Resource Referral / Chronic Care Management:  CRR required this visit?  No   CCM required this visit?  No      Plan:     I have personally reviewed and noted the following in the patients chart:   Medical and social history Use of alcohol, tobacco or illicit Patient currently not taking opioids  Current medications and supplements including opioid prescriptions.  Functional ability and status Nutritional status Physical activity Advanced directives List of other physicians Hospitalizations, surgeries, and ER visits in previous 12 months Vitals Screenings to include cognitive, depression, and falls Referrals and appointments  In addition, I have reviewed and discussed with patient certain preventive protocols, quality metrics, and best practice recommendations. A written personalized care plan for preventive services as well as general preventive health recommendations were provided to patient.     Criselda Peaches, LPN   11/15/9369

## 2021-03-31 ENCOUNTER — Encounter: Attending: Sports Medicine | Primary: Internal Medicine

## 2021-04-06 ENCOUNTER — Encounter: Attending: Obstetrics & Gynecology | Primary: Internal Medicine

## 2021-04-10 ENCOUNTER — Other Ambulatory Visit: Payer: Self-pay | Admitting: Internal Medicine

## 2021-04-11 ENCOUNTER — Ambulatory Visit
Admit: 2021-04-11 | Discharge: 2021-04-11 | Payer: MEDICARE | Attending: Obstetrics & Gynecology | Primary: Internal Medicine

## 2021-04-11 MED ORDER — CLOBETASOL PROPIONATE 0.05 % EX OINT
0.05 % | CUTANEOUS | 3 refills | Status: DC
Start: 2021-04-11 — End: 2021-06-28

## 2021-04-11 MED ORDER — ESTRADIOL 0.1 MG/GM VA CREA
0.1 | Freq: Every day | VAGINAL | 3 refills | Status: DC
Start: 2021-04-11 — End: 2021-04-11

## 2021-04-11 MED ORDER — ESTRADIOL 0.1 MG/GM VA CREA
0.1 | Freq: Every day | VAGINAL | 3 refills | Status: AC
Start: 2021-04-11 — End: ?

## 2021-04-11 NOTE — Progress Notes (Signed)
Lorraine Freeman 76 y.o. [id number] who presents today for GYN annual.  She is taking pain med for sciatica. Last saw Dr. Victorio Palm in 2019.     LMP: 2003  Last Pap: 2 years ago  Pap History:  denies abnormal  Colonoscopy: 03/2021  Mammogram: 02/24/2021  DEXA: 02/24/2021 - osteopenia left hip    Social History     Substance and Sexual Activity   Sexual Activity Not Currently    Partners: Male       Issues with intercourse: n/a  Depression screen:     PHQ-9  04/11/2021   Little interest or pleasure in doing things 0   Feeling down, depressed, or hopeless 0   PHQ-2 Score 0   PHQ-9 Total Score 0        Negative screen    Review of Systems   Constitutional:  Negative for fatigue and fever.   Respiratory:  Negative for cough.    Cardiovascular:  Negative for chest pain.   Gastrointestinal:  Negative for nausea and vomiting.   Genitourinary:  Negative for dysuria and vaginal discharge.   Skin:  Negative for rash.   Neurological:  Negative for headaches.   Psychiatric: Negative for depression/anxiety.     Social History:   Work retired Statistician.   Tobacco Use: Medium Risk    Smoking Tobacco Use: Former    Smokeless Tobacco Use: Never    Passive Exposure: Not on file     Social History     Substance and Sexual Activity   Alcohol Use Yes    Alcohol/week: 10.0 standard drinks    Types: 10 Glasses of wine per week    Comment: 2/day     Social History     Substance and Sexual Activity   Drug Use Never         OB History       Gravida   3    Para   3    Term   3    Preterm   0    AB   0    Living   3         SAB   0    IAB   0    Ectopic   0    Molar   0    Multiple   0    Live Births   3              Past Surgical History:   Procedure Laterality Date    BREAST REDUCTION SURGERY      CHOLECYSTECTOMY      ELBOW SURGERY Right     KNEE SURGERY  11/26/2018    Arthroscopy right knee with partial medial meniscectomy and chondroplasty Dr. Phillip Heal    LOBECTOMY  1980    apical resection    LUMBAR FUSION  2001    Dr. Benjaman Pott  SPINE SURGERY  2003    hardware removal    SHOULDER ARTHROPLASTY Right 06/2012    Dr March Rummage    SHOULDER SURGERY Right 1996    Dr. Phillip Heal    SHOULDER SURGERY Right 2011    Dr. Phillip Heal    SKIN CANCER EXCISION      multiple    [place]    WRIST SURGERY Bilateral     ORIF, bilat wrist fxs    WRIST SURGERY  2007    both wrist    WRIST SURGERY  2005    broken wrist        Past Medical History:   Diagnosis Date    Asthma 1999    Drug effect Levaquin    Tachycardia, sob    Fibromyalgia     High cholesterol     Lung cancer ([place]) 1993    Osteoarthritis     Osteopenia     Sciatica     Spasmodic dysphonia     left vocal cord paralysis         Prior to Admission medications    Medication Sig Start Date End Date Taking? Authorizing Provider   clobetasol (TEMOVATE) 0.05 % ointment Apply topically 2 times daily.  Patient not taking: Reported on 04/20/2021 04/11/21  Yes Oliver Pila, MD   estradiol [facility] VAGINAL) 0.1 MG/GM vaginal cream Place 1 g vaginally daily Nightly x 2 weeks, every other night x 2 weeks, then as needed 04/11/21  Yes Oliver Pila, MD   albuterol sulfate HFA (PROVENTIL;VENTOLIN;PROAIR) 108 (90 Base) MCG/ACT inhaler Ventolin HFA 90 mcg/actuation aerosol inhaler 11/19/18  Yes Historical Provider, MD   ALPRAZolam (XANAX) 0.25 MG tablet TAKE 1 TABLET BY MOUTH TWICE DAILY 01/31/21  Yes Historical Provider, MD   vitamin D3 (CHOLECALCIFEROL) 125 MCG (5000 UT) TABS tablet   5,000 units = tabs, Oral, Daily, # 100 tabs, 0 Refill(s) 11/19/18  Yes Historical Provider, MD   calcium carbonate 1500 (600 Ca) MG TABS tablet 600 mg 11/19/18  Yes Historical Provider, MD   omeprazole (PRILOSEC) 40 MG delayed release capsule TAKE 1 CAPSULE BY MOUTH ONCE DAILY 12/31/20  Yes Historical Provider, MD   acetaminophen (TYLENOL) 325 MG tablet 1 tablet as needed Orally every 6 hrs   Yes [facility] Reconciliation, MD   traMADol (ULTRAM) 50 MG  tablet  04/14/21   Historical Provider, MD   pregabalin (LYRICA) 50 MG capsule  04/13/21   Historical Provider, MD   MAGNESIUM PO Take by mouth    Historical Provider, MD   BIOTIN PO Take by mouth    Historical Provider, MD     Allergies   Allergen Reactions    Levaquin [Levofloxacin] Palpitations and Other (See Comments)     Event:        Dilaudid [Hydromorphone]     Flurbiprofen      Other reaction(s): Unknown    Nsaids      Other reaction(s): stomach upset     Family History   Problem Relation Age of Onset    Colon Cancer Mother     Stroke Father     Abdominal aortic aneurysm Father     Diabetes Father     High Blood Pressure Father     Prostate Cancer Father     Hypertension Father     Heart Surgery Brother     Heart Surgery Brother     Esophageal Cancer Brother     Abdominal aortic aneurysm Brother     Colon Cancer Maternal Grandmother     Breast Cancer Maternal Aunt     Cancer Son         tongue cancer       Vitals:    04/11/21 1416   BP: 130/72       PE: General Examination:  GENERAL APPEARANCE: pleasant, well nourished, well developed, in no acute distress, female.   HEAD: normocephalic, atraumatic.   HEART: normal rate.  LUNGS: nonlabored respirations.   BREASTS: normal, no masses palpable bilaterally, no discharge, no dimpling no axillary lymphadenopathy. Breast augmentation scars bilaterally  ABDOMEN: soft, nontender, nondistended.   SKIN: no suspicious lesions, warm and dry.   PSYCH: alert, oriented.   Gynecological:  EXTERNAL GENITALIA: introitus atrophic but normal in appearace, no lesions.   URETHRA: normal.   VAGINA: normal, atrophic mucosa without any lesions, normal discharge.   CERVIX: normal appearance, no cervical movement tenderness, no lesions or discharge or bleeding.   UTERUS: nontender, difficult to assess secondary to habitus.   ADNEXA: difficult to assess secondary to habitus.       1. Women's annual routine gynecological examination  Overview:  -paps no longer indicated due to age >  32yo  -up to date on mammogram (02/24/21)  -up to date on colonoscopy (03/2021)  -up to date on DEXA  (02/24/2021)  2. Vaginal atrophy  -     estradiol (ESTRACE VAGINAL) 0.1 MG/GM vaginal cream; Place 1 g vaginally daily Nightly x 2 weeks, every other night x 2 weeks, then as needed, Disp-42.5 g, R-3Normal     Return in about 2 years (around 04/12/2023) for GYN annual , medicare .     Oliver Pila, MD

## 2021-04-20 ENCOUNTER — Encounter: Attending: Physician Assistant | Primary: Internal Medicine

## 2021-04-20 NOTE — Progress Notes (Addendum)
Lorraine Freeman   DOB:Apr 17, 1945     Patient presents today with complaints of low back pain radiating to the feet, right leg is more severe.  Symptoms presented over two years ago in the lower back. Radiating pain started one year ago, progressively increasing within the last few months.   How did your current pain start? Unknown.  Pain increases with lying down.  Pain decreases with injections-one week of relief.  Pain level today is 3/10 and can increase to a 10/10 at night.  Patient denies numbness, tingling, weakness. Admits radiating pain.   Patient admits recent Conservative Treatment (past 6 months) Injections-most recent injection was 03/30/21-LESI L5/S1 with Dr. Armond Hang, HEP, Medications. Starts PT next week, this will be her 3rd round in the past few years.  Patient admits previous spinal surgery-Lumbar.    Reason for visit:   Chief Complaint   Patient presents with    Lower Back Pain    RLE radicular pain, no relief with LESI x 2. Wants opinion on SCS trial    History of present illness: Very kind 76 y.o. female presents for evaluation of RLE radicular pain from the lumbar spine. Ms Cogar admits to L5-1 fusion in 2001 with hardware removal in 2003, both performed by Dr Tania Ade. She admits to axial lumbar pain for many years that responded to conservative treatment. Most recently, she has developed severe RLE radicular pain. She had MRI in May 2022 and has been under the care of Dr Armond Hang. She has had LESI L5-1 x 2 without relief. She is taking tramadol and lyrica without significant relief. She reports during the day she can manage but her nighttime pain prevents her from sleeping. She presents today requesting my opinion on SCS trial, or other options she has.     ROS:   A complete ROS based on the patients complaint was obtained today and appropriately  documented in the HPI.    Allergies   Allergen Reactions    Levaquin [Levofloxacin] Palpitations and Other (See Comments)     Event:        Dilaudid  [Hydromorphone]     Flurbiprofen      Other reaction(s): Unknown    Nsaids      Other reaction(s): stomach upset       Past Medical History:   Diagnosis Date    Asthma 1999    Drug effect Levaquin    Tachycardia, sob    Fibromyalgia     High cholesterol     Lung cancer ([place]) 1993    Osteoarthritis     Osteopenia     Sciatica     Spasmodic dysphonia     left vocal cord paralysis        Past Surgical History:   Procedure Laterality Date    BREAST REDUCTION SURGERY      CHOLECYSTECTOMY      ELBOW SURGERY Right     KNEE SURGERY  11/26/2018    Arthroscopy right knee with partial medial meniscectomy and chondroplasty Dr. Phillip Heal    LOBECTOMY  1980    apical resection    LUMBAR FUSION  2001    Dr. Benjaman Pott SPINE SURGERY  2003    hardware removal    SHOULDER ARTHROPLASTY Right 06/2012    Dr Jaynie Bream SURGERY Right 1996    Dr. Phillip Heal    SHOULDER SURGERY Right 2011    Dr. Phillip Heal    SKIN CANCER EXCISION  multiple    SUBMANDIBULAR GLAND EXCISION      THORACOTOMY  1993    TUBAL LIGATION  1980    WRIST SURGERY Bilateral     ORIF, bilat wrist fxs    WRIST SURGERY  2007    both wrist    WRIST SURGERY  2005    broken wrist         Current Outpatient Medications:     traMADol (ULTRAM) 50 MG tablet, , Disp: , Rfl:     pregabalin (LYRICA) 50 MG capsule, , Disp: , Rfl:     MAGNESIUM PO, Take by mouth, Disp: , Rfl:     BIOTIN PO, Take by mouth, Disp: , Rfl:     estradiol (ESTRACE VAGINAL) 0.1 MG/GM vaginal cream, Place 1 g vaginally daily Nightly x 2 weeks, every other night x 2 weeks, then as needed, Disp: 42.5 g, Rfl: 3    albuterol sulfate HFA (PROVENTIL;VENTOLIN;PROAIR) 108 (90 Base) MCG/ACT inhaler, Ventolin HFA 90 mcg/actuation aerosol inhaler, Disp: , Rfl:     ALPRAZolam (XANAX) 0.25 MG tablet, TAKE 1 TABLET BY MOUTH TWICE DAILY, Disp: , Rfl:     vitamin D3 (CHOLECALCIFEROL) 125 MCG (5000 UT) TABS tablet,  5,000 units = tabs, Oral, Daily, # 100 tabs, 0 Refill(s), Disp: , Rfl:     calcium carbonate 1500 (600 Ca) MG  TABS tablet, 600 mg, Disp: , Rfl:     omeprazole (PRILOSEC) 40 MG delayed release capsule, TAKE 1 CAPSULE BY MOUTH ONCE DAILY, Disp: , Rfl:     acetaminophen (TYLENOL) 325 MG tablet, 1 tablet as needed Orally every 6 hrs, Disp: , Rfl:     clobetasol (TEMOVATE) 0.05 % ointment, Apply topically 2 times daily. (Patient not taking: Reported on 04/20/2021), Disp: 60 g, Rfl: 3       Radiological Findings:  MRI Lumbar Spine Without Contrast:08/08/20  L5-S1: There is fusion across the L5/S1 disc space. No canal compromise. Facets  are fused.  Mild right foraminal narrowing, normal left.    L4-L5: Disc maintained, no herniation or stenosis.  Mild bilateral facet  degeneration, no significant foraminal compromise.    L3-L4: Mild annular bulge, facet degeneration and hypertrophy. Mild canal  stenosis.  Mild foraminal narrowing.    L2-L3: Disc maintained, no herniation or stenosis.  No foraminal stenosis.    L1-L2: Disc maintained, no herniation or stenosis.  No foraminal stenosis.     Lumbar vertebral body height, alignment, and marrow signal is maintained. The  conus medullaris is normal terminating at L1-L2    Assessment:  1. Chronic pain syndrome     Very kind 76 year old female with hx of L5-1 fusion, hardware removal 20 years ago. Now with fusion across the L5-1 disc space and foraminal stenosis at L5-1.   Failed LESI, PT, lyrica and tramadol  I do recommend SCS trial for her unilateral severe neuropathic pain. I discussed that it is important that she make sure the person performing an SCS trial and especially implanting the device has experience in doing so, as the SCS will only work properly if it is implanted in the correct position.   She should proceed with SCS psych clearance required by insurance.  I independently interpreted the MRI L spine and reviewed this in detail with her today.     Plan:      Visit Diagnoses         Codes    Chronic pain syndrome    -  Primary G89.4  After history, exam, obtaining  greater than 3 items from [facility] and discussion of options, I do recommend she proceed with SCS trial. All questions answered today, patient states understanding. She will call if she needs anything further from [place].     Greer Ee, PA  [facility]

## 2021-04-29 DIAGNOSIS — Z01419 Encounter for gynecological examination (general) (routine) without abnormal findings: Secondary | ICD-10-CM

## 2021-05-02 NOTE — Telephone Encounter (Signed)
°*  STAT* If patient is at the pharmacy, call can be transferred to refill team.   1. Which medications need to be refilled? (please list name of each medication and dose if known) REPATHA SURECLICK 093 MG/ML SOAJ  2. Which pharmacy/location (including street and city if local pharmacy) is medication to be sent to? [id number] - [place], [place] - [address]  3. Do they need a 30 day or 90 day supply? 30 day   Patient if out of medication and is over due for her injection.

## 2021-05-04 ENCOUNTER — Other Ambulatory Visit: Payer: Self-pay | Admitting: Internal Medicine

## 2021-05-06 ENCOUNTER — Telehealth: Payer: Self-pay | Admitting: Internal Medicine

## 2021-05-06 DIAGNOSIS — I2510 Atherosclerotic heart disease of native coronary artery without angina pectoris: Secondary | ICD-10-CM

## 2021-05-06 MED ORDER — REPATHA SURECLICK 140 MG/ML ~~LOC~~ SOAJ: 140.0000 mg | SUBCUTANEOUS | 11 refills | Status: DC

## 2021-05-06 NOTE — Telephone Encounter (Signed)
°*  STAT* If patient is at the pharmacy, call can be transferred to refill team.   1. Which medications need to be refilled? (please list name of each medication and dose if known)  REPATHA SURECLICK 511 MG/ML SOAJ  2. Which pharmacy/location (including street and city if local pharmacy) is medication to be sent to?  3. Do they need a 30 day or 90 day supply? 30 with refills [id number] - [place], [place] - [address]  Patient is out of medication  The rx that was e-scribed 05/02/21 was not received by the pharmacy. Please send a new rx

## 2021-05-18 DIAGNOSIS — L918 Other hypertrophic disorders of the skin: Secondary | ICD-10-CM | POA: Diagnosis not present

## 2021-05-18 DIAGNOSIS — Z85828 Personal history of other malignant neoplasm of skin: Secondary | ICD-10-CM | POA: Diagnosis not present

## 2021-05-18 DIAGNOSIS — L821 Other seborrheic keratosis: Secondary | ICD-10-CM | POA: Diagnosis not present

## 2021-05-18 DIAGNOSIS — D1801 Hemangioma of skin and subcutaneous tissue: Secondary | ICD-10-CM | POA: Diagnosis not present

## 2021-06-27 NOTE — Telephone Encounter (Signed)
Pt stated that she would like to get the clobatasol .05% probably 15g called in for her. Pt is aware of the 24/48hr turn around time. Pharmacy verified. Please assist.

## 2021-06-28 MED ORDER — CLOBETASOL PROPIONATE 0.05 % EX OINT
0.05 % | CUTANEOUS | 3 refills | Status: AC
Start: 2021-06-28 — End: 2023-04-19

## 2021-08-06 ENCOUNTER — Other Ambulatory Visit: Payer: Self-pay | Admitting: Family Medicine

## 2021-08-09 ENCOUNTER — Other Ambulatory Visit: Payer: Self-pay | Admitting: Family Medicine

## 2021-08-16 ENCOUNTER — Other Ambulatory Visit: Payer: Self-pay | Admitting: Family Medicine

## 2021-08-16 ENCOUNTER — Telehealth: Payer: Self-pay | Admitting: Family Medicine

## 2021-08-16 NOTE — Telephone Encounter (Signed)
Requesting that this provider start managing this medication buPROPion (WELLBUTRIN XL) 300 MG 24 hr tablet

## 2021-08-16 NOTE — Telephone Encounter (Signed)
Spoke to patient. Inform her that she would needs an appointment. Patient would like to go ahead to do blood work. Patient is to call back to schedule an appointment.

## 2021-08-16 NOTE — Telephone Encounter (Signed)
Pt should schedule appointment for further refills

## 2021-09-08 ENCOUNTER — Ambulatory Visit (INDEPENDENT_AMBULATORY_CARE_PROVIDER_SITE_OTHER): Payer: Medicare PPO | Admitting: Family Medicine

## 2021-09-08 ENCOUNTER — Encounter: Payer: Self-pay | Admitting: Family Medicine

## 2021-09-08 VITALS — BP 138/70 | HR 59 | Temp 98.1°F | Ht 65.0 in | Wt 162.5 lb

## 2021-09-08 DIAGNOSIS — J4520 Mild intermittent asthma, uncomplicated: Secondary | ICD-10-CM | POA: Diagnosis not present

## 2021-09-08 DIAGNOSIS — R32 Unspecified urinary incontinence: Secondary | ICD-10-CM | POA: Diagnosis not present

## 2021-09-08 DIAGNOSIS — I10 Essential (primary) hypertension: Secondary | ICD-10-CM | POA: Diagnosis not present

## 2021-09-08 DIAGNOSIS — K219 Gastro-esophageal reflux disease without esophagitis: Secondary | ICD-10-CM | POA: Diagnosis not present

## 2021-09-08 DIAGNOSIS — F418 Other specified anxiety disorders: Secondary | ICD-10-CM

## 2021-09-08 DIAGNOSIS — E785 Hyperlipidemia, unspecified: Secondary | ICD-10-CM | POA: Diagnosis not present

## 2021-09-08 DIAGNOSIS — E538 Deficiency of other specified B group vitamins: Secondary | ICD-10-CM | POA: Diagnosis not present

## 2021-09-08 DIAGNOSIS — S93492A Sprain of other ligament of left ankle, initial encounter: Secondary | ICD-10-CM

## 2021-09-08 DIAGNOSIS — S161XXA Strain of muscle, fascia and tendon at neck level, initial encounter: Secondary | ICD-10-CM | POA: Diagnosis not present

## 2021-09-08 DIAGNOSIS — R739 Hyperglycemia, unspecified: Secondary | ICD-10-CM | POA: Diagnosis not present

## 2021-09-08 DIAGNOSIS — M1611 Unilateral primary osteoarthritis, right hip: Secondary | ICD-10-CM | POA: Diagnosis not present

## 2021-09-08 LAB — LIPID PANEL
Cholesterol: 149 mg/dL (ref 0–200)
HDL: 84.5 mg/dL (ref >39.00)
LDL Cholesterol: 39 mg/dL (ref 0–99)
NonHDL: 64.19
Total CHOL/HDL Ratio: 2
Triglycerides: 128 mg/dL (ref 0.0–149.0)
VLDL: 25.6 mg/dL (ref 0.0–40.0)

## 2021-09-08 LAB — CBC WITH DIFFERENTIAL/PLATELET
Basophils Absolute: 0 10*3/uL (ref 0.0–0.1)
Basophils Relative: 0.6 % (ref 0.0–3.0)
Eosinophils Absolute: 0.1 10*3/uL (ref 0.0–0.7)
Eosinophils Relative: 1.9 % (ref 0.0–5.0)
HCT: 36.3 % (ref 36.0–46.0)
Hemoglobin: 11.9 g/dL — ABNORMAL LOW (ref 12.0–15.0)
Lymphocytes Relative: 18.9 % (ref 12.0–46.0)
Lymphs Abs: 0.7 10*3/uL (ref 0.7–4.0)
MCHC: 32.7 g/dL (ref 30.0–36.0)
MCV: 84.6 fl (ref 78.0–100.0)
Monocytes Absolute: 0.3 10*3/uL (ref 0.1–1.0)
Monocytes Relative: 8.7 % (ref 3.0–12.0)
Neutro Abs: 2.8 10*3/uL (ref 1.4–7.7)
Neutrophils Relative %: 69.9 % (ref 43.0–77.0)
Platelets: 179 10*3/uL (ref 150.0–400.0)
RBC: 4.3 Mil/uL (ref 3.87–5.11)
RDW: 13.9 % (ref 11.5–15.5)
WBC: 4 10*3/uL (ref 4.0–10.5)

## 2021-09-08 LAB — BASIC METABOLIC PANEL
BUN: 23 mg/dL (ref 6–23)
CO2: 28 mEq/L (ref 19–32)
Calcium: 9.7 mg/dL (ref 8.4–10.5)
Chloride: 103 mEq/L (ref 96–112)
Creatinine, Ser: 1.27 mg/dL — ABNORMAL HIGH (ref 0.40–1.20)
GFR: 41.85 mL/min — ABNORMAL LOW (ref >60.00)
Glucose, Bld: 86 mg/dL (ref 70–99)
Potassium: 4 mEq/L (ref 3.5–5.1)
Sodium: 139 mEq/L (ref 135–145)

## 2021-09-08 LAB — HEPATIC FUNCTION PANEL
ALT: 16 U/L (ref 0–35)
AST: 24 U/L (ref 0–37)
Albumin: 4.7 g/dL (ref 3.5–5.2)
Alkaline Phosphatase: 45 U/L (ref 39–117)
Bilirubin, Direct: 0.1 mg/dL (ref 0.0–0.3)
Total Bilirubin: 0.4 mg/dL (ref 0.2–1.2)
Total Protein: 7.4 g/dL (ref 6.0–8.3)

## 2021-09-08 LAB — HEMOGLOBIN A1C: Hgb A1c MFr Bld: 5.5 % (ref 4.6–6.5)

## 2021-09-08 LAB — TSH: TSH: 3.06 u[IU]/mL (ref 0.35–5.50)

## 2021-09-08 LAB — VITAMIN B12: Vitamin B-12: 91 pg/mL — ABNORMAL LOW (ref 211–911)

## 2021-09-08 MED ORDER — FAMOTIDINE 20 MG PO TABS: 20.0000 mg | ORAL_TABLET | Freq: Every day | ORAL | 0 refills | Status: DC

## 2021-09-08 NOTE — Progress Notes (Signed)
Subjective:    Patient ID: Maria Schneider, female    DOB: 1946-03-18, 76 y.o.   MRN: [id number]  HPI Here to follow up on issues. First she asks me to check her neck. About 3 months ago sghe slipped down some steps and fell. Since then she has had a pain in the right side of the neck that shoots to the shoulder. No radiation to the arm. She takes Ibuprofen sometimes. Also since then she has had some stiffness and very mild pain in the left ankle. This never swelled. Ibuprofen helps a little. Also she had seen seeing someone at the [facility] for depression and anxiety, and these are quite stable. She has been on the same medications (Zoloft and Wellbutrin) for years with no changes, and she asks if we can assume caring for this problem. Her GERD and HTN are stable. However she has heard that long time use of medications like Nexium can have side effects. Her urinary incontinence is stable.    Review of Systems  Constitutional: Negative.   HENT: Negative.    Eyes: Negative.   Respiratory: Negative.    Cardiovascular: Negative.   Gastrointestinal: Negative.   Genitourinary:  Negative for decreased urine volume, difficulty urinating, dyspareunia, dysuria, enuresis, flank pain, frequency, hematuria, pelvic pain and urgency.  Musculoskeletal:  Positive for arthralgias, neck pain and neck stiffness.  Skin: Negative.   Neurological: Negative.  Negative for headaches.  Psychiatric/Behavioral: Negative.         Objective:   Physical Exam Constitutional:      General: She is not in acute distress.    Appearance: Normal appearance. She is well-developed.  HENT:     Head: Normocephalic and atraumatic.     Right Ear: External ear normal.     Left Ear: External ear normal.     Nose: Nose normal.     Mouth/Throat:     Pharynx: No oropharyngeal exudate.  Eyes:     General: No scleral icterus.    Conjunctiva/sclera: Conjunctivae normal.     Pupils: Pupils are equal, round,  and reactive to light.  Neck:     Thyroid: No thyromegaly.     Vascular: No JVD.  Cardiovascular:     Rate and Rhythm: Normal rate and regular rhythm.     Heart sounds: Normal heart sounds. No murmur heard.    No friction rub. No gallop.  Pulmonary:     Effort: Pulmonary effort is normal. No respiratory distress.     Breath sounds: Normal breath sounds. No wheezing or rales.  Chest:     Chest wall: No tenderness.  Abdominal:     General: Bowel sounds are normal. There is no distension.     Palpations: Abdomen is soft. There is no mass.     Tenderness: There is no abdominal tenderness. There is no guarding or rebound.  Musculoskeletal:        General: Normal range of motion.     Cervical back: Normal range of motion and neck supple.     Comments: She is tender in the right posterior neck and in the right upper trapezius. ROM is full. The left ankle appears normal with no swelling and full ROM. She is tender however just beneath both malleoli   Lymphadenopathy:     Cervical: No cervical adenopathy.  Skin:    General: Skin is warm and dry.     Findings: No erythema or rash.  Neurological:     Mental  Status: She is alert and oriented to person, place, and time.     Cranial Nerves: No cranial nerve deficit.     Motor: No abnormal muscle tone.     Coordination: Coordination normal.     Deep Tendon Reflexes: Reflexes are normal and symmetric. Reflexes normal.  Psychiatric:        Behavior: Behavior normal.        Thought Content: Thought content normal.        Judgment: Judgment normal.           Assessment & Plan:  She has a neck strain from the fall, and we will refer her to PT for this. She also has a left high ankle sprain that should heal with a little more time. Her HTN is stable. He depression and anxiety are stable, and we agreed to refill the Zoloft and Wellbutrin. Her GERD is stable but we will stop the Nexium and use Famotidine instead. Get fasting labs for lipids  ,etc. We spent a total of ( 35  ) minutes reviewing records and discussing these issues.  [name], MD

## 2021-09-12 ENCOUNTER — Telehealth: Payer: Self-pay | Admitting: Family Medicine

## 2021-09-12 MED ORDER — BUPROPION HCL ER (XL) 300 MG PO TB24: 300.0000 mg | ORAL_TABLET | Freq: Every day | ORAL | 0 refills | Status: DC

## 2021-09-26 ENCOUNTER — Ambulatory Visit: Payer: Medicare PPO

## 2021-09-26 ENCOUNTER — Ambulatory Visit (INDEPENDENT_AMBULATORY_CARE_PROVIDER_SITE_OTHER): Payer: Medicare PPO | Admitting: *Deleted

## 2021-09-26 DIAGNOSIS — E538 Deficiency of other specified B group vitamins: Secondary | ICD-10-CM

## 2021-09-26 MED ORDER — CYANOCOBALAMIN 1000 MCG/ML IJ SOLN
1000.0000 ug | Freq: Once | INTRAMUSCULAR | Status: AC
  Administered 2021-09-26: 1000 ug via INTRAMUSCULAR

## 2021-09-26 NOTE — Progress Notes (Signed)
Per orders of Dr. Fry, injection of Cyanocobalamin 1000mcg given by Dareion Kneece A. Patient tolerated injection well.  

## 2021-10-03 ENCOUNTER — Ambulatory Visit (INDEPENDENT_AMBULATORY_CARE_PROVIDER_SITE_OTHER): Payer: Medicare PPO | Admitting: *Deleted

## 2021-10-03 DIAGNOSIS — E538 Deficiency of other specified B group vitamins: Secondary | ICD-10-CM

## 2021-10-03 MED ORDER — CYANOCOBALAMIN 1000 MCG/ML IJ SOLN
1000.0000 ug | Freq: Once | INTRAMUSCULAR | Status: AC
  Administered 2021-10-03: 1000 ug via INTRAMUSCULAR

## 2021-10-03 NOTE — Progress Notes (Signed)
Per orders of Dr. Fry, injection of Cyanocobalamin 1000mcg given by Chrisoula Zegarra A. Patient tolerated injection well.  

## 2021-10-04 ENCOUNTER — Telehealth: Payer: Self-pay | Admitting: Family Medicine

## 2021-10-04 NOTE — Telephone Encounter (Signed)
Spoke with patient about message.    Voiced understanding.

## 2021-10-04 NOTE — Telephone Encounter (Signed)
Lvm for patient to return phone call.

## 2021-10-04 NOTE — Telephone Encounter (Signed)
Take 2 at a time of the OTC Pepcid (this would make a total of 40 mg, which is the prescription dose). Report back in a week or so

## 2021-10-04 NOTE — Telephone Encounter (Signed)
Last OV- 09/08/21

## 2021-10-04 NOTE — Telephone Encounter (Signed)
Pt is calling and does not want to make an appt. Pt was on nexium OTC and per pt dr fry switch her to OTC pepcid. Pt is calling and pepcid is not working. Pt advise

## 2021-10-05 ENCOUNTER — Other Ambulatory Visit: Payer: Self-pay

## 2021-10-05 ENCOUNTER — Ambulatory Visit: Payer: Medicare PPO | Attending: Family Medicine | Admitting: Physical Therapy

## 2021-10-05 ENCOUNTER — Encounter: Payer: Self-pay | Admitting: Physical Therapy

## 2021-10-05 DIAGNOSIS — M6281 Muscle weakness (generalized): Secondary | ICD-10-CM | POA: Insufficient documentation

## 2021-10-05 DIAGNOSIS — S161XXA Strain of muscle, fascia and tendon at neck level, initial encounter: Secondary | ICD-10-CM | POA: Insufficient documentation

## 2021-10-05 DIAGNOSIS — Y939 Activity, unspecified: Secondary | ICD-10-CM | POA: Insufficient documentation

## 2021-10-05 DIAGNOSIS — M542 Cervicalgia: Secondary | ICD-10-CM | POA: Diagnosis not present

## 2021-10-05 DIAGNOSIS — R252 Cramp and spasm: Secondary | ICD-10-CM

## 2021-10-05 NOTE — Therapy (Signed)
OUTPATIENT PHYSICAL THERAPY CERVICAL EVALUATION   Patient Name: Maria Schneider MRN: [id number] DOB:[birth year], 76 y.o., female Today's Date: 10/05/2021   PT End of Session - 10/05/21 1016     Visit Number 1    Number of Visits 16    Date for PT Re-Evaluation 11/30/21    Authorization Type [employer] will submit    PT Start Time 1018    PT Stop Time 1100    PT Time Calculation (min) 42 min    Activity Tolerance Patient tolerated treatment well             Past Medical History:  Diagnosis Date   Allergy    Arthritis    Asthma    Cancer ([place])    hx skin cancer   Depression    Detrusor instability    Diverticulosis    not symptomatic   GERD (gastroesophageal reflux disease)    History of kidney stones    History of pancreatitis    April 2015   History of skin cancer    Hyperlipidemia    Hypertension    Osteopenia    Urinary incontinence    Vitamin D deficiency    RESOLVED    Past Surgical History:  Procedure Laterality Date   ABDOMINAL HYSTERECTOMY  1994   TAH/ BLADDER SUSPENSION   BREAST BIOPSY  1972   benign   CHOLECYSTECTOMY N/A 07/12/2013   Procedure: LAPAROSCOPIC CHOLECYSTECTOMY WITH INTRAOPERATIVE CHOLANGIOGRAM;  Surgeon: Edward Jolly, MD;  Location: [facility];  Service: General;  Laterality: N/A;   COLONOSCOPY  06-24-12   per Dr. Earlean Shawl, benign polyps, repeat in 5 yrs     ERCP N/A 07/14/2013   Procedure: ENDOSCOPIC RETROGRADE CHOLANGIOPANCREATOGRAPHY (ERCP);  Surgeon: Gatha Mayer, MD;  Location: [facility];  Service: Endoscopy;  Laterality: N/A;   TONSILLECTOMY     TOTAL HIP ARTHROPLASTY Right 01/13/2015   Procedure: RIGHT TOTAL HIP ARTHROPLASTY ANTERIOR APPROACH;  Surgeon: Gaynelle Arabian, MD;  Location: [facility];  Service: Orthopedics;  Laterality: Right;   TOTAL HIP ARTHROPLASTY Left 11/07/2017   Procedure: LEFT TOTAL HIP ARTHROPLASTY ANTERIOR APPROACH;  Surgeon: Gaynelle Arabian, MD;  Location: [facility];  Service: Orthopedics;  Laterality: Left;   TUBAL  LIGATION     Patient Active Problem List   Diagnosis Date Noted   Depression with anxiety 09/08/2021   Cystitis 02/18/2021   COVID-19 virus infection 12/08/2020   OA (osteoarthritis) of hip 01/13/2015   B12 deficiency 01/07/2014   Bradycardia 01/07/2014   Dyslipidemia 04/12/2009   Essential hypertension 04/12/2009   ALLERGIC RHINITIS 04/12/2009   Asthma 04/12/2009   GERD 04/12/2009   URINARY INCONTINENCE 04/12/2009    PCP: Dr. [name]  REFERRING PROVIDER: Dr. [name]  REFERRING DIAG: neck strain  THERAPY DIAG:  Cervicalgia; weakness Rationale for Evaluation and Treatment Rehabilitation  ONSET DATE: May 2023  SUBJECTIVE:  SUBJECTIVE STATEMENT: In May walking down wooden steps and mistepped hitting right side, bruise on abdomen and bottom; worsened over several days then started right arm, shoulder and neck.  Also diagnosed with a high ankle sprain which is improving.  Been walking daily since the fall.    PERTINENT HISTORY:  3 lumbar disc pathology chronic  3 blocked cardiac vessels gets injections no activity restriction Calf pain ruled out vascular Bil THR Had DN on back/hip in the past Her son and family just moved to [place], [place]  PAIN:  Are you having pain? Yes NPRS scale: 1/10 Pain location: right upper trap region Aggravating factors: prolonged standing, turning after sitting a while  Relieving factors: nothing, tried heat/ice and massage  PRECAUTIONS: Fall  WEIGHT BEARING RESTRICTIONS No  FALLS:  Has patient fallen in last 6 months? Yes. Number of falls 1 with this injury;  2 more over the years  LIVING ENVIRONMENT: Lives with: lives with their family and lives with their spouse Lives in: House/apartment   OCCUPATION: retired   PLOF:  [place] teach me some things I can do at home  OBJECTIVE:   DIAGNOSTIC FINDINGS:  none  PATIENT SURVEYS:  FOTO 58%   POSTURE: No Significant postural limitations  PALPATION: Tender points in right upper trap   CERVICAL ROM:   Active ROM A/PROM (deg) eval  Flexion 65  Extension 55  Right lateral flexion 30  Left lateral flexion 40  Right rotation 40  Left rotation 52   (Blank rows = not tested)  UPPER EXTREMITY ROM:  WFLs in all planes and non painful  UPPER EXTREMITY MMT:  Grossly 4+/5   CERVICAL SPECIAL TESTS:  Upper limb tension test (ULTT): Negative and Distraction test: Negative    TODAY'S TREATMENT:  Basic HEP, discussed DN   PATIENT EDUCATION:  Education details: basic HEP Person educated: Patient Education method: Explanation, Demonstration, and Handouts Education comprehension: verbalized understanding and returned demonstration   HOME EXERCISE PROGRAM: Access Code: [email or web address] URL: [email or web address] Date: 10/05/2021 Prepared by: Ruben Im  Exercises - Seated Scapular Retraction  - 2 x daily - 7 x weekly - 1 sets - 10 reps - Seated Cervical Sidebending Stretch  - 2 x daily - 7 x weekly - 1 sets - 3 reps - 30 hold - Seated Levator Scapulae Stretch (Mirrored)  - 2 x daily - 7 x weekly - 1 sets - 3 reps - 30 hold  ASSESSMENT:  CLINICAL IMPRESSION: Patient is a 76 y.o. female who was seen today for physical therapy evaluation and treatment for neck pain. The patient would benefit from skilled PT to address decreased cervical range of motion, correct muscle strength asymmetries, improve postural strength including and scapular retractor and depressors and address pain levels.  All affect patient's ability to perform home ADLS including prolonged standing, and turning her head for driving.   OBJECTIVE IMPAIRMENTS decreased ROM, increased fascial restrictions, increased muscle spasms, and pain.   ACTIVITY  LIMITATIONS carrying, lifting, sitting, and standing  PARTICIPATION LIMITATIONS: meal prep and cleaning  PERSONAL FACTORS 1 comorbidity: multi region pain  are also affecting patient's functional outcome.   REHAB POTENTIAL: Good  CLINICAL DECISION MAKING: Stable/uncomplicated  EVALUATION COMPLEXITY: Low   GOALS: Goals reviewed with patient? Yes  SHORT TERM GOALS: Target date: 11/02/2021   The patient will demonstrate knowledge of basic self care strategies and exercises to promote healing   Baseline:  Goal status: INITIAL  2.  The patient will report a  30% improvement in pain levels with functional activities which are currently difficult including turning her head and prolonged standing Baseline:  Goal status: INITIAL  3.  Cervical rotation ROM improved to 55 degrees bilaterally needed for driving Baseline:  Goal status: INITIAL     LONG TERM GOALS: Target date: 11/30/2021  The patient will be independent in a safe self progression of a home exercise program to promote further recovery of function   Baseline:  Goal status: INITIAL  2.  The patient will report a 75% improvement in pain levels with functional activities which are currently difficult including prolonged standing and turning her head after sitting a while Baseline:  Goal status: INITIAL  3.  Cervical sidebending to 45 degrees bil and rotation to 60 degrees needed for driving Baseline:  Goal status: INITIAL    4.  The patient will have improved FOTO score to   67%    indicating improved function with less pain  Baseline:  Goal status: INITIAL     PLAN: PT FREQUENCY: 1-2x/week  PT DURATION: 8 weeks  PLANNED INTERVENTIONS: Therapeutic exercises, Therapeutic activity, Neuromuscular re-education, Patient/Family education, Self Care, Joint mobilization, Aquatic Therapy, Dry Needling, Electrical stimulation, Spinal mobilization, Cryotherapy, Moist heat, Taping, Traction, Ultrasound, Ionotophoresis  '4mg'$ /ml Dexamethasone, and Manual therapy  PLAN FOR NEXT SESSION:  pt receptive to DN (had in the past); manual therapy targeting upper trap and levator scap;  postural ex's, review of HEP; pt did not have her calendar today and will call back to make appts  Ruben Im, PT 10/05/21 5:33 PM Phone: [phone] Fax: [phone]

## 2021-10-08 ENCOUNTER — Other Ambulatory Visit: Payer: Self-pay | Admitting: Internal Medicine

## 2021-10-10 ENCOUNTER — Inpatient Hospital Stay: Admit: 2021-10-10 | Discharge: 2021-10-10 | Payer: MEDICARE | Primary: Family Medicine

## 2021-10-10 ENCOUNTER — Ambulatory Visit: Admit: 2021-10-10 | Discharge: 2021-10-10 | Payer: MEDICARE | Attending: Family Medicine | Primary: Family Medicine

## 2021-10-10 ENCOUNTER — Ambulatory Visit (INDEPENDENT_AMBULATORY_CARE_PROVIDER_SITE_OTHER): Payer: Medicare PPO

## 2021-10-10 DIAGNOSIS — Z13220 Encounter for screening for lipoid disorders: Secondary | ICD-10-CM

## 2021-10-10 DIAGNOSIS — K219 Gastro-esophageal reflux disease without esophagitis: Secondary | ICD-10-CM

## 2021-10-10 DIAGNOSIS — R252 Cramp and spasm: Secondary | ICD-10-CM

## 2021-10-10 DIAGNOSIS — E538 Deficiency of other specified B group vitamins: Secondary | ICD-10-CM

## 2021-10-10 LAB — COMPREHENSIVE METABOLIC PANEL
ALT: 23 U/L (ref 0–35)
AST: 21 U/L (ref 0–35)
Albumin/Globulin Ratio: 1.43 (ref 1.00–2.70)
Albumin: 4.2 g/dL (ref 3.5–5.2)
Alk Phosphatase: 81 U/L (ref 35–117)
Anion Gap: 12 mmol/L (ref 2–17)
BUN: 19 mg/dL (ref 8–23)
CO2: 25 mmol/L (ref 22–29)
Calcium: 8.3 mg/dL — ABNORMAL LOW (ref 8.8–10.2)
Chloride: 103 mmol/L (ref 98–107)
Creatinine: 0.8 mg/dL (ref 0.5–1.0)
Est, Glom Filt Rate: 78 mL/min/1.73m?? (ref >=60)
Globulin: 2.9 g/dL (ref 1.9–4.4)
Glucose: 106 mg/dL — ABNORMAL HIGH (ref 70–99)
Osmolaliy Calculated: 282 mosm/kg (ref 270–287)
Potassium: 4.7 mmol/L (ref 3.5–5.3)
Sodium: 140 mmol/L (ref 135–145)
Total Bilirubin: 0.55 mg/dL (ref 0.00–1.20)
Total Protein: 7.1 g/dL (ref 6.4–8.3)

## 2021-10-10 LAB — CBC
Hematocrit: 44.3 % (ref 34.0–47.0)
Hemoglobin: 15 g/dL (ref 11.5–15.7)
MCH: 31.5 pg (ref 27.0–34.5)
MCHC: 33.9 g/dL (ref 32.0–36.0)
MCV: 93.1 fL (ref 81.0–99.0)
MPV: 8.8 fL (ref 7.2–13.2)
NRBC Absolute: 0 10*3/uL (ref 0.000–0.012)
NRBC Automated: 0 % (ref 0.0–0.2)
Platelets: 314 10*3/uL (ref 140–440)
RBC: 4.76 x10e6/mcL (ref 3.60–5.20)
RDW: 13.1 % (ref 11.0–16.0)
WBC: 7.5 10*3/uL (ref 3.8–10.6)

## 2021-10-10 LAB — LIPID PANEL
Chol/HDL Ratio: 3.9 (ref 0.0–4.4)
Cholesterol: 252 mg/dL — ABNORMAL HIGH (ref 100–200)
HDL: 64 mg/dL (ref >=50)
LDL Cholesterol: 162 mg/dL — ABNORMAL HIGH (ref 0.0–100.0)
LDL/HDL Ratio: 2.5
Triglycerides: 131 mg/dL (ref 0–149)
VLDL: 26.2 mg/dL (ref 5.0–40.0)

## 2021-10-10 LAB — MAGNESIUM: Magnesium: 2.2 mg/dL (ref 1.6–2.6)

## 2021-10-10 MED ORDER — OMEPRAZOLE 40 MG PO CPDR
40 MG | ORAL_CAPSULE | Freq: Every day | ORAL | 3 refills | Status: AC
Start: 2021-10-10 — End: 2022-01-30

## 2021-10-10 MED ORDER — ALPRAZOLAM 0.25 MG PO TABS
0.25 MG | ORAL_TABLET | Freq: Two times a day (BID) | ORAL | 3 refills | Status: AC | PRN
Start: 2021-10-10 — End: 2021-12-09

## 2021-10-10 MED ORDER — CYANOCOBALAMIN 1000 MCG/ML IJ SOLN
1000.0000 ug | INTRAMUSCULAR | Status: AC
  Administered 2021-10-10 – 2021-11-14 (×3): 1000 ug via INTRAMUSCULAR

## 2021-10-10 NOTE — Progress Notes (Signed)
PROGRESS NOTE     Lorraine Freeman is a 76 y.o.female here for evaluation of the following chief complaint(s):    Chief Complaint   Patient presents with    New Patient     SUBJECTIVE   New to me. New to the clinic.     76yo F w/ h/o chronic back pain, r knee pain, h/o lung cancer s/p lobectomy, former smoker, asthma, vocal cord paralysis, significant fam hx heart disease    Gyn: Dr. Seabron Spates     Chronic back pain:   She sees [facility] for chronic back pain. She has been recommended for spinal cord stimulator trial. She has a hx of L5-1 fusion. She has previously tried LESI, PT, lyrica, tramadol. Recent MRI lumbar spine in 07/2020.   **she states she does not plan to do the SCS. She feels the injections in her back have helped. She started seeing dr Colin Ina for her hips and is getting injections which seem to be helping.     R knee pain:   She sees Dr. Phillip Heal for right knee pain.  She has arthritis and a meniscal tear. She has received an injection and recommendation for exercises at home and a brace.     She c/o severe cramping in feet and calves. This occurs at rest. She does not have this pain when ambulating. She drinks water throughout the day. She has discoloration of her legs. She sometimes has cold feet.     H/o lung cancer:   -recurrent pneumothorax in the 70s   -dx left lower lobe adenocystic carcinoma. She had left lower lobectomy in the 80s. She has resulting vocal cord dysfunction. She takes xanax which helps. She has received botox injections in her vocal cords. She was seen by neurology as well and found to have vocal cord paralysis. She takes the xanax prn if she has something that triggers her. She had follow up for 2 years for the lung cancers.   -she smoked in her 70s.   -she declines to do any follow up cxr     Asthma/Vocal cord paralysis   She has asthma and sees [facility]. Dr. Quita Skye   -she rarely uses her inhaler.     She has not tolerated statins. Father had MI. Older  brother had MI. Two brothers w/ CABG.     She has a hx gastritis. She takes omeprazole 40mg . She has q3 year colonoscopy and egd.     She has asthma and uses albuterol and atrovent prn.     Pneumonia: thinks she had it at [facility]  Shingles: utd  Covid: declines   Tdap:   Colonoscopy: q3 years colonoscopy and endoscoopy t [facility] 2023   Mammogram: December     OBJECTIVE     Vitals:    10/10/21 0924   BP: 126/82   Site: Left Upper Arm   Position: Sitting   Pulse: 77   SpO2: 98%   Weight: 173 lb 1 oz (78.5 kg)   Height: 5\' 3"  (1.6 m)       Physical Exam:   General: well developed, well nourished, no acute distress  Skin: warm, dry, no rash  Cardio: rrr, no m/g/r  Respiratory: CTAB, no increased wob, no wheezing   Neuro: awake, alert  Psych: normal mood and affect     Allergies   Allergen Reactions    Levaquin [Levofloxacin] Palpitations and Other (See Comments)     Event:  Dilaudid [Hydromorphone]     Flurbiprofen      Other reaction(s): Unknown    Nsaids      Other reaction(s): stomach upset      Prior to Admission medications    Medication Sig Start Date End Date Taking? Authorizing Provider   omeprazole (PRILOSEC) 40 MG delayed release capsule Take 1 capsule by mouth daily 10/10/21  Yes Reatha Armour, MD   ALPRAZolam Duanne Moron) 0.25 MG tablet Take 1 tablet by mouth 2 times daily as needed for Anxiety for up to 60 days. Max Daily Amount: 0.5 mg 10/10/21 12/09/21 Yes Reatha Armour, MD   clobetasol (TEMOVATE) 0.05 % ointment Apply topically 2 times daily. 06/28/21  Yes Oliver Pila, MD   MAGNESIUM PO Take by mouth   Yes Historical Provider, MD   BIOTIN PO Take by mouth   Yes Historical Provider, MD   estradiol (ESTRACE VAGINAL) 0.1 MG/GM vaginal cream Place 1 g vaginally daily Nightly x 2 weeks, every other night x 2 weeks, then as needed 04/11/21  Yes Oliver Pila, MD   albuterol sulfate HFA (PROVENTIL;VENTOLIN;PROAIR) 108 (90 Base) MCG/ACT inhaler Ventolin HFA 90  mcg/actuation aerosol inhaler 11/19/18  Yes Historical Provider, MD   vitamin D3 (CHOLECALCIFEROL) 125 MCG (5000 UT) TABS tablet   5,000 units = tabs, Oral, Daily, # 100 tabs, 0 Refill(s) 11/19/18  Yes Historical Provider, MD   calcium carbonate 1500 (600 Ca) MG TABS tablet 1 tablet 11/19/18  Yes Historical Provider, MD   acetaminophen (TYLENOL) 325 MG tablet 1 tablet as needed Orally every 6 hrs   Yes [facility] Automatic Reconciliation, MD      Family History   Problem Relation Age of Onset    Colon Cancer Mother     Stroke Father     Abdominal aortic aneurysm Father     Diabetes Father     High Blood Pressure Father     Prostate Cancer Father     Hypertension Father     Heart Surgery Brother     Heart Surgery Brother     Esophageal Cancer Brother     Abdominal aortic aneurysm Brother     Colon Cancer Maternal Grandmother     Breast Cancer Maternal Aunt     Cancer Son         tongue cancer      Social History     Socioeconomic History    Marital status: Married     Spouse name: Not on file    Number of children: Not on file    Years of education: Not on file    Highest education level: Not on file   Occupational History    Not on file   Tobacco Use    Smoking status: Former     Packs/day: 1.00     Years: 10.00     Pack years: 10.00     Types: Cigarettes     Start date: 06/19/1963     Quit date: 03/20/1977     Years since quitting: 44.6    Smokeless tobacco: Never   Vaping Use    Vaping Use: Never used   Substance and Sexual Activity    Alcohol use: Yes     Alcohol/week: 10.0 standard drinks     Types: 10 Glasses of wine per week     Comment: 2/day    Drug use: Never    Sexual activity: Not Currently     Partners: Male  Other Topics Concern    Not on file   Social History Narrative    Not on file     Social Determinants of Health     Financial Resource Strain: Not on file   Food Insecurity: Not on file   Transportation Needs: Not on file   Physical Activity: Inactive    Days of Exercise per Week: 0 days    Minutes of  Exercise per Session: 0 min   Stress: Not on file   Social Connections: Not on file   Intimate Partner Violence: Not on file   Housing Stability: Not on file      Past Surgical History:   Procedure Laterality Date    BREAST REDUCTION SURGERY      CHOLECYSTECTOMY      ELBOW SURGERY Right     KNEE SURGERY  11/26/2018    Arthroscopy right knee with partial medial meniscectomy and chondroplasty Dr. Phillip Heal    LOBECTOMY  1980    apical resection    LUMBAR FUSION  2001    Dr. Benjaman Pott SPINE SURGERY  2003    hardware removal    SHOULDER ARTHROPLASTY Right 06/2012    Dr March Rummage    SHOULDER SURGERY Right 1996    Dr. Phillip Heal    SHOULDER SURGERY Right 2011    Dr. Phillip Heal    SKIN CANCER EXCISION      multiple    [place] Bilateral     ORIF, bilat wrist fxs    WRIST SURGERY  2007    both wrist    WRIST SURGERY  2005    broken wrist      Past Medical History:   Diagnosis Date    Asthma 1999    Drug effect Levaquin    Tachycardia, sob    Fibromyalgia     High cholesterol     Lung cancer ([place]) 1993    Osteoarthritis     Osteopenia     Sciatica     Spasmodic dysphonia     left vocal cord paralysis       ASSESSMENT/PLAN   [place] was seen today for new patient.    Diagnoses and all orders for this visit:    Gastroesophageal reflux disease, unspecified whether esophagitis present  Comments:  stable  Orders:  -     omeprazole (PRILOSEC) 40 MG delayed release capsule; Take 1 capsule by mouth daily    Leg cramps  -     Comprehensive Metabolic Panel; Future  -     CBC; Future  -     Magnesium; Future  -     Vascular ankle brachial index (ABI); Future    Screening, lipid  -     Lipid Panel; Future    Hyperlipidemia, unspecified hyperlipidemia type  -     CT CARDIAC CALCIUM SCORING; Future  -     External Referral To Cardiology    Family history of heart disease  -     CT CARDIAC CALCIUM SCORING; Future  -     External Referral To Cardiology    History of  lung cancer  Comments:  declines cxr    Vocal cord paralysis  Comments:  stable  Orders:  -     ALPRAZolam (XANAX) 0.25 MG tablet; Take 1 tablet by mouth 2 times daily as needed for  Anxiety for up to 60 days. Max Daily Amount: 0.5 mg    Mild intermittent asthma without complication  Comments:  stable       An electronic signature was used to authenticate this note.    --Reatha Armour, MD

## 2021-10-10 NOTE — Progress Notes (Signed)
Pt here for weekly B12 injection #3 of 12 per Dr. Sarajane Jews.  B12 1027mg given IM left deltoid and pt tolerated injection well.  Next B12 injection scheduled for 10/18/21.

## 2021-10-11 NOTE — Telephone Encounter (Signed)
Last Ov 09/08/21 Filled 09/12/21

## 2021-10-12 ENCOUNTER — Telehealth: Payer: Self-pay | Admitting: Family Medicine

## 2021-10-12 NOTE — Telephone Encounter (Signed)
Patient calling in with respiratory symptoms: Shortness of breath, chest pain, palpitations or other red words send to Triage  Does the patient have a fever over 100, cough, congestion, sore throat, runny nose, lost of taste/smell (please list symptoms that patient has)? Runny nose, sore throat, cough   What date did symptoms start?10-10-2021 (If over 5 days ago, pt may be scheduled for in person visit)  Have you tested for Covid in the last 5 days? Yes   If yes, was it positive '[x]'$  OR negative '[]'$ ? If positive in the last 5 days, please schedule virtual visit now. If negative, schedule for an in person OV with the next available provider if PCP has no openings. Please also let patient know they will be tested again (follow the script below)  "you will have to arrive 65mns prior to your appt time to be Covid tested. Please park in back of office at the [facility] & call [phone] let the staff know you have arrived. A staff member will meet you at your car to do a rapid covid test. Once the test has resulted you will be notified by phone of your results to determine if appt will remain an in person visit or be converted to a virtual/phone visit. If you arrive less than 38ms before your appt time, your visit will be automatically converted to virtual & any recommended testing will happen AFTER the visit." Pt has virtual on 10/13/2021 1045 am  [place] for virtual visit in office,  please schedule another [facility] office  If no availability at another [facility], please instruct patient that they can schedule an evisit or virtual visit through their mychart account. Visits up to 8pm  patients can be seen in office 5 days after positive COVID test

## 2021-10-13 ENCOUNTER — Encounter: Payer: Self-pay | Admitting: Family Medicine

## 2021-10-13 ENCOUNTER — Telehealth (INDEPENDENT_AMBULATORY_CARE_PROVIDER_SITE_OTHER): Payer: Medicare PPO | Admitting: Family Medicine

## 2021-10-13 VITALS — Temp 100.9°F

## 2021-10-13 DIAGNOSIS — U071 COVID-19: Secondary | ICD-10-CM

## 2021-10-13 MED ORDER — MOLNUPIRAVIR EUA 200MG CAPSULE: 4.0000 | ORAL_CAPSULE | Freq: Two times a day (BID) | ORAL | 0 refills | Status: AC

## 2021-10-13 NOTE — Progress Notes (Signed)
Subjective:    Patient ID: Maria Schneider, female    DOB: [birth year], 76 y.o.   MRN: [id number]  HPI Virtual Visit via Video Note  I connected with the patient on 10/13/21 at 10:45 AM EDT by a video enabled telemedicine application and verified that I am speaking with the correct person using two identifiers.  Location patient: home Location provider:work or home office Persons participating in the virtual visit: patient, provider  I discussed the limitations of evaluation and management by telemedicine and the availability of in person appointments. The patient expressed understanding and agreed to proceed.   HPI: Here for a Covid-19 infection. She has had 4 days of fever to 101.5 degrees, ST, and a dry cough. No SOB or chest pain. She tested positive for the Covid virus yesterday. Drinking fluids and taking tylenol.    ROS: See pertinent positives and negatives per HPI.  Past Medical History:  Diagnosis Date   Allergy    Arthritis    Asthma    Cancer ([place])    hx skin cancer   Depression    Detrusor instability    Diverticulosis    not symptomatic   GERD (gastroesophageal reflux disease)    History of kidney stones    History of pancreatitis    April 2015   History of skin cancer    Hyperlipidemia    Hypertension    Osteopenia    Urinary incontinence    Vitamin D deficiency    RESOLVED     Past Surgical History:  Procedure Laterality Date   ABDOMINAL HYSTERECTOMY  1994   TAH/ BLADDER SUSPENSION   BREAST BIOPSY  1972   benign   CHOLECYSTECTOMY N/A 07/12/2013   Procedure: LAPAROSCOPIC CHOLECYSTECTOMY WITH INTRAOPERATIVE CHOLANGIOGRAM;  Surgeon: Edward Jolly, MD;  Location: [facility];  Service: General;  Laterality: N/A;   COLONOSCOPY  06-24-12   per Dr. Earlean Shawl, benign polyps, repeat in 5 yrs     ERCP N/A 07/14/2013   Procedure: ENDOSCOPIC RETROGRADE CHOLANGIOPANCREATOGRAPHY (ERCP);  Surgeon: Gatha Mayer, MD;  Location: [facility];  Service: Endoscopy;   Laterality: N/A;   TONSILLECTOMY     TOTAL HIP ARTHROPLASTY Right 01/13/2015   Procedure: RIGHT TOTAL HIP ARTHROPLASTY ANTERIOR APPROACH;  Surgeon: Gaynelle Arabian, MD;  Location: [facility];  Service: Orthopedics;  Laterality: Right;   TOTAL HIP ARTHROPLASTY Left 11/07/2017   Procedure: LEFT TOTAL HIP ARTHROPLASTY ANTERIOR APPROACH;  Surgeon: Gaynelle Arabian, MD;  Location: [facility];  Service: Orthopedics;  Laterality: Left;   TUBAL LIGATION      Family History  Problem Relation Age of Onset   Hypertension Mother    Osteoporosis Mother    Heart disease Father    Osteoporosis Sister    Breast cancer Paternal Aunt        Age 81's   Diabetes Paternal Uncle      Current Outpatient Medications:    aspirin EC 81 MG tablet, Take 1 tablet (81 mg total) by mouth daily. Swallow whole., Disp: 90 tablet, Rfl: 3   buPROPion (WELLBUTRIN XL) 300 MG 24 hr tablet, TAKE 1 TABLET BY MOUTH EVERY DAY, Disp: 90 tablet, Rfl: 3   Cholecalciferol (VITAMIN D3) 25 MCG (1000 UT) CAPS, Take 5,000 Units by mouth daily. , Disp: , Rfl:    Evolocumab (REPATHA SURECLICK) 272 MG/ML SOAJ, Inject 140 mg into the skin every 14 (fourteen) days., Disp: 2 mL, Rfl: 11   ezetimibe (ZETIA) 10 MG tablet, TAKE 1 TABLET BY MOUTH  EVERY DAY, Disp: 90 tablet, Rfl: 3   famotidine (PEPCID) 20 MG tablet, Take 1 tablet (20 mg total) by mouth daily. (Patient taking differently: Take 40 mg by mouth daily.), Disp: 1 tablet, Rfl: 0   fenofibrate 160 MG tablet, TAKE 1 TABLET BY MOUTH EVERY DAY, Disp: 90 tablet, Rfl: 0   metoprolol succinate (TOPROL-XL) 25 MG 24 hr tablet, TAKE 1 TABLET (25 MG TOTAL) BY MOUTH DAILY., Disp: 90 tablet, Rfl: 0   molnupiravir EUA (LAGEVRIO) 200 mg CAPS capsule, Take 4 capsules (800 mg total) by mouth 2 (two) times daily for 5 days., Disp: 40 capsule, Rfl: 0   MYRBETRIQ 50 MG TB24 tablet, Take 50 mg by mouth daily., Disp: , Rfl: 11   sertraline (ZOLOFT) 100 MG tablet, Take 200 mg by mouth daily., Disp: , Rfl:    valsartan  (DIOVAN) 80 MG tablet, TAKE 1 TABLET BY MOUTH EVERY DAY, Disp: 90 tablet, Rfl: 1  Current Facility-Administered Medications:    cyanocobalamin ((VITAMIN B-12)) injection 1,000 mcg, 1,000 mcg, Intramuscular, Weekly, Laurey Morale, MD, 1,000 mcg at 10/10/21 1007  EXAM:  VITALS per patient if applicable:  GENERAL: alert, oriented, appears well and in no acute distress  HEENT: atraumatic, conjunttiva clear, no obvious abnormalities on inspection of external nose and ears  NECK: normal movements of the head and neck  LUNGS: on inspection no signs of respiratory distress, breathing rate appears normal, no obvious gross SOB, gasping or wheezing  CV: no obvious cyanosis  MS: moves all visible extremities without noticeable abnormality  PSYCH/NEURO: pleasant and cooperative, no obvious depression or anxiety, speech and thought processing grossly intact  ASSESSMENT AND PLAN: Covid infection, treat with 5 days of Molnupiravir. Recheck as needed.  [name], MD  Discussed the following assessment and plan:  No diagnosis found.     I discussed the assessment and treatment plan with the patient. The patient was provided an opportunity to ask questions and all were answered. The patient agreed with the plan and demonstrated an understanding of the instructions.   The patient was advised to call back or seek an in-person evaluation if the symptoms worsen or if the condition fails to improve as anticipated.      Review of Systems     Objective:   Physical Exam        Assessment & Plan:

## 2021-10-18 ENCOUNTER — Ambulatory Visit (INDEPENDENT_AMBULATORY_CARE_PROVIDER_SITE_OTHER): Payer: Medicare PPO | Admitting: *Deleted

## 2021-10-18 DIAGNOSIS — E538 Deficiency of other specified B group vitamins: Secondary | ICD-10-CM

## 2021-10-18 MED ORDER — CYANOCOBALAMIN 1000 MCG/ML IJ SOLN
1000.0000 ug | Freq: Once | INTRAMUSCULAR | Status: AC
  Administered 2021-10-18: 1000 ug via INTRAMUSCULAR

## 2021-10-18 NOTE — Progress Notes (Signed)
Per orders of Dr. Sarajane Jews injection of Cyanocobalamin 1026mg given by FAgnes Lawrence Patient tolerated injection well.

## 2021-10-24 ENCOUNTER — Inpatient Hospital Stay: Admit: 2021-10-24 | Payer: MEDICARE | Attending: Family Medicine | Primary: Family Medicine

## 2021-10-24 ENCOUNTER — Ambulatory Visit (INDEPENDENT_AMBULATORY_CARE_PROVIDER_SITE_OTHER): Payer: Medicare PPO

## 2021-10-24 DIAGNOSIS — R252 Cramp and spasm: Secondary | ICD-10-CM

## 2021-10-24 DIAGNOSIS — E785 Hyperlipidemia, unspecified: Secondary | ICD-10-CM

## 2021-10-24 DIAGNOSIS — E538 Deficiency of other specified B group vitamins: Secondary | ICD-10-CM

## 2021-10-24 NOTE — Other (Signed)
Pt aware

## 2021-10-24 NOTE — Progress Notes (Signed)
Pt here for weekly B12 injection #5 of 12 per Dr. Sarajane Jews.  B12 1049mg given IM left deltoid and pt tolerated injection well.  Next B12 injection scheduled for 10/31/21.

## 2021-10-25 LAB — VAS ANKLE BRACHIAL INDEX (ABI)
Left ABI: 1.19
Left TBI: 1.01
Left arm BP: 123 mmHg
Left dorsalis pedis BP: 128 mmHg
Left posterior tibial: 151 mmHg
Left toe pressure: 128 mmHg
Right ABI: 1.23
Right TBI: 1.08
Right arm BP: 127 mmHg
Right dorsalis pedis BP: 155 mmHg
Right posterior tibial: 156 mmHg
Right toe pressure: 137 mmHg

## 2021-10-27 ENCOUNTER — Ambulatory Visit: Payer: Medicare PPO | Attending: Family Medicine | Admitting: Physical Therapy

## 2021-10-27 DIAGNOSIS — R252 Cramp and spasm: Secondary | ICD-10-CM | POA: Diagnosis not present

## 2021-10-27 DIAGNOSIS — M542 Cervicalgia: Secondary | ICD-10-CM | POA: Insufficient documentation

## 2021-10-27 DIAGNOSIS — M6281 Muscle weakness (generalized): Secondary | ICD-10-CM | POA: Insufficient documentation

## 2021-10-27 NOTE — Therapy (Signed)
OUTPATIENT PHYSICAL THERAPY CERVICAL PROGRESS NOTE   Patient Name: Maria Schneider MRN: [id number] DOB:[birth year], 76 y.o., female Today's Date: 10/27/2021   PT End of Session - 10/27/21 1618     Visit Number 2    Number of Visits 16    Date for PT Re-Evaluation 11/30/21    Authorization Type 7/19-9/13  16 visits    PT Start Time 1618    PT Stop Time 1655   PT Time Calculation (min) 35 min    Activity Tolerance Patient tolerated treatment well             Past Medical History:  Diagnosis Date   Allergy    Arthritis    Asthma    Cancer ([place])    hx skin cancer   Depression    Detrusor instability    Diverticulosis    not symptomatic   GERD (gastroesophageal reflux disease)    History of kidney stones    History of pancreatitis    April 2015   History of skin cancer    Hyperlipidemia    Hypertension    Osteopenia    Urinary incontinence    Vitamin D deficiency    RESOLVED    Past Surgical History:  Procedure Laterality Date   ABDOMINAL HYSTERECTOMY  1994   TAH/ BLADDER SUSPENSION   BREAST BIOPSY  1972   benign   CHOLECYSTECTOMY N/A 07/12/2013   Procedure: LAPAROSCOPIC CHOLECYSTECTOMY WITH INTRAOPERATIVE CHOLANGIOGRAM;  Surgeon: Edward Jolly, MD;  Location: [facility];  Service: General;  Laterality: N/A;   COLONOSCOPY  06-24-12   per Dr. Earlean Shawl, benign polyps, repeat in 5 yrs     ERCP N/A 07/14/2013   Procedure: ENDOSCOPIC RETROGRADE CHOLANGIOPANCREATOGRAPHY (ERCP);  Surgeon: Gatha Mayer, MD;  Location: [facility];  Service: Endoscopy;  Laterality: N/A;   TONSILLECTOMY     TOTAL HIP ARTHROPLASTY Right 01/13/2015   Procedure: RIGHT TOTAL HIP ARTHROPLASTY ANTERIOR APPROACH;  Surgeon: Gaynelle Arabian, MD;  Location: [facility];  Service: Orthopedics;  Laterality: Right;   TOTAL HIP ARTHROPLASTY Left 11/07/2017   Procedure: LEFT TOTAL HIP ARTHROPLASTY ANTERIOR APPROACH;  Surgeon: Gaynelle Arabian, MD;  Location: [facility];  Service: Orthopedics;  Laterality: Left;    TUBAL LIGATION     Patient Active Problem List   Diagnosis Date Noted   Depression with anxiety 09/08/2021   Cystitis 02/18/2021   COVID-19 virus infection 12/08/2020   OA (osteoarthritis) of hip 01/13/2015   B12 deficiency 01/07/2014   Bradycardia 01/07/2014   Dyslipidemia 04/12/2009   Essential hypertension 04/12/2009   ALLERGIC RHINITIS 04/12/2009   Asthma 04/12/2009   GERD 04/12/2009   URINARY INCONTINENCE 04/12/2009    PCP: Dr. [name]  REFERRING PROVIDER: Dr. [name]  REFERRING DIAG: neck strain  THERAPY DIAG:  Cervicalgia; weakness Rationale for Evaluation and Treatment Rehabilitation  ONSET DATE: May 2023  SUBJECTIVE:  SUBJECTIVE STATEMENT: I got Covid and it was a bad case. The neck is good.  I did some scrubbing and tested my shoulder.    PERTINENT HISTORY:  3 lumbar disc pathology chronic  3 blocked cardiac vessels gets injections no activity restriction Calf pain ruled out vascular Bil THR Had DN on back/hip in the past Her son and family just moved to [place], [place]:  Are you having pain? no NPRS scale: 0/10 Pain location: right upper trap region Aggravating factors: prolonged standing, turning after sitting a while  Relieving factors: nothing, tried heat/ice and massage  PRECAUTIONS: Fall  WEIGHT BEARING RESTRICTIONS No  FALLS:  Has patient fallen in last 6 months? Yes. Number of falls 1 with this injury;  2 more over the years  LIVING ENVIRONMENT: Lives with: lives with their family and lives with their spouse Lives in: House/apartment   OCCUPATION: retired   PLOF: [place] teach me some things I can do at home  OBJECTIVE:   DIAGNOSTIC FINDINGS:  none  PATIENT SURVEYS:  FOTO 58%   POSTURE: No  Significant postural limitations  PALPATION: Tender points in right upper trap   CERVICAL ROM:   Active ROM A/PROM (deg) eval  Flexion 65  Extension 55  Right lateral flexion 30  Left lateral flexion 40  Right rotation 40  Left rotation 52   (Blank rows = not tested)  UPPER EXTREMITY ROM:  WFLs in all planes and non painful  UPPER EXTREMITY MMT:  Grossly 4+/5   CERVICAL SPECIAL TESTS:  Upper limb tension test (ULTT): Negative and Distraction test: Negative    TODAY'S TREATMENT:  8/10: Standing green band rows 10x Standing green band shoulder extension 10x Standing green band horizontal abduction 10x Seated thoracic extension with ball 10x Discussion of DN and determined if symptoms present next visit may do   PATIENT EDUCATION:  Education details: basic HEP Person educated: Patient Education method: Explanation, Demonstration, and Handouts Education comprehension: verbalized understanding and returned demonstration   HOME EXERCISE PROGRAM: Access Code: [email or web address] URL: [email or web address] Date: 10/27/2021 Prepared by: Ruben Im  Exercises - Seated Scapular Retraction  - 2 x daily - 7 x weekly - 1 sets - 10 reps - Seated Cervical Sidebending Stretch  - 2 x daily - 7 x weekly - 1 sets - 3 reps - 30 hold - Seated Levator Scapulae Stretch (Mirrored)  - 2 x daily - 7 x weekly - 1 sets - 3 reps - 30 hold - Standing Row with Anchored Resistance  - 1 x daily - 7 x weekly - 1 sets - 10 reps - Shoulder extension with resistance - Neutral  - 1 x daily - 7 x weekly - 1 sets - 10 reps - Standing Shoulder Horizontal Abduction with Resistance  - 1 x daily - 7 x weekly - 1 sets - 10 reps - Standing Shoulder Horizontal Abduction with Resistance  - 1 x daily - 7 x weekly - 1 sets - 10 reps - Seated Thoracic Lumbar Extension with Pectoralis Stretch  - 1 x daily - 7 x weekly - 1 sets - 10 reps  ASSESSMENT:  CLINICAL IMPRESSION: The patient returns to PT  after being sick with covid.  She reports she has not had pain recently even with doing some household chores.  Initiated postural strengthening with verbal cues to avoid compensatory shoulder shrug.  She reports a very mild sensation on right compared to left during ex's.  Good response to  thoracic extension.      OBJECTIVE IMPAIRMENTS decreased ROM, increased fascial restrictions, increased muscle spasms, and pain.   ACTIVITY LIMITATIONS carrying, lifting, sitting, and standing  PARTICIPATION LIMITATIONS: meal prep and cleaning  PERSONAL FACTORS 1 comorbidity: multi region pain  are also affecting patient's functional outcome.   REHAB POTENTIAL: Good  CLINICAL DECISION MAKING: Stable/uncomplicated  EVALUATION COMPLEXITY: Low   GOALS: Goals reviewed with patient? Yes  SHORT TERM GOALS: Target date: 11/02/2021   The patient will demonstrate knowledge of basic self care strategies and exercises to promote healing   Baseline:  Goal status: INITIAL  2.  The patient will report a 30% improvement in pain levels with functional activities which are currently difficult including turning her head and prolonged standing Baseline:  Goal status: INITIAL  3.  Cervical rotation ROM improved to 55 degrees bilaterally needed for driving Baseline:  Goal status: INITIAL     LONG TERM GOALS: Target date: 11/30/2021  The patient will be independent in a safe self progression of a home exercise program to promote further recovery of function   Baseline:  Goal status: INITIAL  2.  The patient will report a 75% improvement in pain levels with functional activities which are currently difficult including prolonged standing and turning her head after sitting a while Baseline:  Goal status: INITIAL  3.  Cervical sidebending to 45 degrees bil and rotation to 60 degrees needed for driving Baseline:  Goal status: INITIAL    4.  The patient will have improved FOTO score to   67%    indicating  improved function with less pain  Baseline:  Goal status: INITIAL     PLAN: PT FREQUENCY: 1-2x/week  PT DURATION: 8 weeks  PLANNED INTERVENTIONS: Therapeutic exercises, Therapeutic activity, Neuromuscular re-education, Patient/Family education, Self Care, Joint mobilization, Aquatic Therapy, Dry Needling, Electrical stimulation, Spinal mobilization, Cryotherapy, Moist heat, Taping, Traction, Ultrasound, Ionotophoresis '4mg'$ /ml Dexamethasone, and Manual therapy  PLAN FOR NEXT SESSION:  pt receptive to DN (had in the past); manual therapy targeting upper trap and levator scap;  postural ex's, review of HEP; follow up in 2 weeks (traveling next week);  going to [facility] in September  Ruben Im, PT 10/27/21 4:18 PM Phone: [phone] Fax: [phone]

## 2021-10-31 ENCOUNTER — Ambulatory Visit (INDEPENDENT_AMBULATORY_CARE_PROVIDER_SITE_OTHER): Payer: Medicare PPO | Admitting: *Deleted

## 2021-10-31 DIAGNOSIS — E538 Deficiency of other specified B group vitamins: Secondary | ICD-10-CM

## 2021-10-31 MED ORDER — CYANOCOBALAMIN 1000 MCG/ML IJ SOLN
1000.0000 ug | Freq: Once | INTRAMUSCULAR | Status: AC
  Administered 2021-10-31: 1000 ug via INTRAMUSCULAR

## 2021-10-31 NOTE — Progress Notes (Signed)
Per orders of Dr. Fry, injection of B12 given by Jahnaya Branscome. Patient tolerated injection well. 

## 2021-11-03 ENCOUNTER — Encounter

## 2021-11-04 ENCOUNTER — Other Ambulatory Visit: Payer: Self-pay | Admitting: Family Medicine

## 2021-11-07 ENCOUNTER — Ambulatory Visit (INDEPENDENT_AMBULATORY_CARE_PROVIDER_SITE_OTHER): Payer: Medicare PPO

## 2021-11-07 DIAGNOSIS — E538 Deficiency of other specified B group vitamins: Secondary | ICD-10-CM | POA: Diagnosis not present

## 2021-11-07 MED ORDER — CYANOCOBALAMIN 1000 MCG/ML IJ SOLN
1000.0000 ug | Freq: Once | INTRAMUSCULAR | Status: AC
  Administered 2021-11-07: 1000 ug via INTRAMUSCULAR

## 2021-11-07 NOTE — Progress Notes (Signed)
Pt here for monthly B12 injection per Dr. Fry  B12 1000mcg given IM, and pt tolerated injection well.    

## 2021-11-10 ENCOUNTER — Ambulatory Visit: Payer: Medicare PPO | Admitting: Physical Therapy

## 2021-11-10 DIAGNOSIS — M542 Cervicalgia: Secondary | ICD-10-CM | POA: Diagnosis not present

## 2021-11-10 DIAGNOSIS — R252 Cramp and spasm: Secondary | ICD-10-CM | POA: Diagnosis not present

## 2021-11-10 DIAGNOSIS — M6281 Muscle weakness (generalized): Secondary | ICD-10-CM

## 2021-11-10 NOTE — Therapy (Addendum)
OUTPATIENT PHYSICAL THERAPY CERVICAL PROGRESS NOTE/DISCHARGE SUMMARY   Patient Name: Maria Schneider MRN: [id number] DOB:12/24/1945, 76 y.o., female Today's Date: 11/10/2021  PT End of Session - 11/10/21 1445     Visit Number 3    Number of Visits 16    Date for PT Re-Evaluation 11/30/21    Authorization Type 7/19-9/13  16 visits    PT Start Time 1445    PT Stop Time 1530    PT Time Calculation (min) 45 min    Activity Tolerance Patient tolerated treatment well                  Past Medical History:  Diagnosis Date   Allergy    Arthritis    Asthma    Cancer ([facility])    hx skin cancer   Depression    Detrusor instability    Diverticulosis    not symptomatic   GERD (gastroesophageal reflux disease)    History of kidney stones    History of pancreatitis    April 2015   History of skin cancer    Hyperlipidemia    Hypertension    Osteopenia    Urinary incontinence    Vitamin D deficiency    RESOLVED    Past Surgical History:  Procedure Laterality Date   ABDOMINAL HYSTERECTOMY  1994   TAH/ BLADDER SUSPENSION   BREAST BIOPSY  1972   benign   CHOLECYSTECTOMY N/A 07/12/2013   Procedure: LAPAROSCOPIC CHOLECYSTECTOMY WITH INTRAOPERATIVE CHOLANGIOGRAM;  Surgeon: Benjamin T Hoxworth, MD;  Location: [facility];  Service: General;  Laterality: N/A;   COLONOSCOPY  06-24-12   per Dr. Medoff, benign polyps, repeat in 5 yrs     ERCP N/A 07/14/2013   Procedure: ENDOSCOPIC RETROGRADE CHOLANGIOPANCREATOGRAPHY (ERCP);  Surgeon: Carl E Gessner, MD;  Location: [facility];  Service: Endoscopy;  Laterality: N/A;   TONSILLECTOMY     TOTAL HIP ARTHROPLASTY Right 01/13/2015   Procedure: RIGHT TOTAL HIP ARTHROPLASTY ANTERIOR APPROACH;  Surgeon: Frank Aluisio, MD;  Location: [facility];  Service: Orthopedics;  Laterality: Right;   TOTAL HIP ARTHROPLASTY Left 11/07/2017   Procedure: LEFT TOTAL HIP ARTHROPLASTY ANTERIOR APPROACH;  Surgeon: Aluisio, Frank, MD;  Location: [facility];  Service: Orthopedics;   Laterality: Left;   TUBAL LIGATION     Patient Active Problem List   Diagnosis Date Noted   Depression with anxiety 09/08/2021   Cystitis 02/18/2021   COVID-19 virus infection 12/08/2020   OA (osteoarthritis) of hip 01/13/2015   B12 deficiency 01/07/2014   Bradycardia 01/07/2014   Dyslipidemia 04/12/2009   Essential hypertension 04/12/2009   ALLERGIC RHINITIS 04/12/2009   Asthma 04/12/2009   GERD 04/12/2009   URINARY INCONTINENCE 04/12/2009    PCP: Dr. Stephen Fry  REFERRING PROVIDER: Dr. Stephen Fry  REFERRING DIAG: neck strain  THERAPY DIAG:  Cervicalgia; weakness Rationale for Evaluation and Treatment Rehabilitation  ONSET DATE: May 2023  SUBJECTIVE:                                                                                                                                                                                                           SUBJECTIVE STATEMENT: Feeling good on vacation, just a twinge but not 100%.   I feel a spot here (Levator scap region area on right.)       PERTINENT HISTORY:  3 lumbar disc pathology chronic  3 blocked cardiac vessels gets injections no activity restriction Calf pain ruled out vascular Bil THR Had DN on back/hip in the past Her son and family just moved to [place], [place]  PAIN:  Are you having pain? no NPRS scale: 0/10 Pain location: right upper trap region Aggravating factors: prolonged standing, turning after sitting a while  Relieving factors: nothing, tried heat/ice and massage  PRECAUTIONS: Fall  WEIGHT BEARING RESTRICTIONS No  FALLS:  Has patient fallen in last 6 months? Yes. Number of falls 1 with this injury;  2 more over the years  LIVING ENVIRONMENT: Lives with: lives with their family and lives with their spouse Lives in: House/apartment   OCCUPATION: retired   PLOF: Independent  PATIENT GOALS teach me some things I can do at home  OBJECTIVE:   DIAGNOSTIC FINDINGS:  none  PATIENT SURVEYS:   FOTO 58%   POSTURE: No Significant postural limitations  PALPATION: Tender points in right upper trap   CERVICAL ROM:   Active ROM A/PROM (deg) eval  Flexion 65  Extension 55  Right lateral flexion 30  Left lateral flexion 40  Right rotation 40  Left rotation 52   (Blank rows = not tested)  UPPER EXTREMITY ROM:  WFLs in all planes and non painful  UPPER EXTREMITY MMT:  Grossly 4+/5   CERVICAL SPECIAL TESTS:  Upper limb tension test (ULTT): Negative and Distraction test: Negative    TODAY'S TREATMENT:  8/24: Asymmetry with lateral side bending ROM (left sidbending more limited) Manual therapy: soft tissue mobilization to right upper trap and levator scap Trigger Point Dry-Needling  Treatment instructions: Expect mild to moderate muscle soreness. S/S of pneumothorax if dry needled over a lung field, and to seek immediate medical attention should they occur. Patient verbalized understanding of these instructions and education.  Patient Consent Given: Yes Education handout provided: Previously provided (had DN in the past) Muscles treated: right upper trap and levator scap Electrical stimulation performed: No Parameters: N/A Treatment response/outcome: improved soft tissue mobility and dec size and number of tender points Moist heat 2 min while reviewed DN after care  Discussion of HEP emphasis (levator scap stretch and upper trap review) Encouraged band ex's for best long term benefit      8/10: Standing green band rows 10x Standing green band shoulder extension 10x Standing green band horizontal abduction 10x Seated thoracic extension with ball 10x Discussion of DN and determined if symptoms present next visit may do   PATIENT EDUCATION:  Education details: basic HEP Person educated: Patient Education method: Explanation, Demonstration, and Handouts Education comprehension: verbalized understanding and returned demonstration   HOME EXERCISE  PROGRAM: Access Code: [email or web address] URL: [email or web address] Date: 10/27/2021 Prepared by: Stacy Simpson  Exercises - Seated Scapular Retraction  - 2 x daily - 7 x weekly - 1 sets - 10 reps - Seated Cervical Sidebending Stretch  - 2 x daily - 7 x weekly - 1 sets - 3 reps - 30 hold - Seated Levator Scapulae Stretch (Mirrored)  - 2 x daily - 7 x weekly - 1 sets - 3 reps - 30 hold - Standing Row with Anchored Resistance  - 1 x daily - 7 x weekly - 1 sets - 10 reps - Shoulder extension with resistance -   Neutral  - 1 x daily - 7 x weekly - 1 sets - 10 reps - Standing Shoulder Horizontal Abduction with Resistance  - 1 x daily - 7 x weekly - 1 sets - 10 reps - Standing Shoulder Horizontal Abduction with Resistance  - 1 x daily - 7 x weekly - 1 sets - 10 reps - Seated Thoracic Lumbar Extension with Pectoralis Stretch  - 1 x daily - 7 x weekly - 1 sets - 10 reps  ASSESSMENT:  CLINICAL IMPRESSION: Myofacial tender points in right upper trap and levator scap muscles.  Good response to manual therapy and DN with multiple twitch responses produced ( a good prognostic indicator of benefit).  Improved cervical sidebending and rotation noted following treatment. All STGs met.   Good carryover with HEP.  Therapist monitoring response to all interventions and modifying treatment accordingly.      OBJECTIVE IMPAIRMENTS decreased ROM, increased fascial restrictions, increased muscle spasms, and pain.   ACTIVITY LIMITATIONS carrying, lifting, sitting, and standing  PARTICIPATION LIMITATIONS: meal prep and cleaning  PERSONAL FACTORS 1 comorbidity: multi region pain  are also affecting patient's functional outcome.   REHAB POTENTIAL: Good  CLINICAL DECISION MAKING: Stable/uncomplicated  EVALUATION COMPLEXITY: Low   GOALS: Goals reviewed with patient? Yes  SHORT TERM GOALS: Target date: 11/02/2021   The patient will demonstrate knowledge of basic self care strategies and exercises to  promote healing   Baseline:  Goal status: met 8/24  2.  The patient will report a 30% improvement in pain levels with functional activities which are currently difficult including turning her head and prolonged standing Baseline:  Goal status: met 8/24  3.  Cervical rotation ROM improved to 55 degrees bilaterally needed for driving Baseline:  Goal status: met 8/24     LONG TERM GOALS: Target date: 11/30/2021  The patient will be independent in a safe self progression of a home exercise program to promote further recovery of function   Baseline:  Goal status: INITIAL  2.  The patient will report a 75% improvement in pain levels with functional activities which are currently difficult including prolonged standing and turning her head after sitting a while Baseline:  Goal status: INITIAL  3.  Cervical sidebending to 45 degrees bil and rotation to 60 degrees needed for driving Baseline:  Goal status: INITIAL    4.  The patient will have improved FOTO score to   67%    indicating improved function with less pain  Baseline:  Goal status: INITIAL     PLAN: PT FREQUENCY: 1-2x/week  PT DURATION: 8 weeks  PLANNED INTERVENTIONS: Therapeutic exercises, Therapeutic activity, Neuromuscular re-education, Patient/Family education, Self Care, Joint mobilization, Aquatic Therapy, Dry Needling, Electrical stimulation, Spinal mobilization, Cryotherapy, Moist heat, Taping, Traction, Ultrasound, Ionotophoresis 72m/ml Dexamethasone, and Manual therapy  PLAN FOR NEXT SESSION:  assess response to DN ; add shoulder  3 ways strengthening with dumbbells;  manual therapy targeting upper trap and levator scap;  postural ex's, review of HEP;  [place] will [place] for authorization  SRuben Im PT 11/10/21 3:29 PM Phone: [phone]: [phone]  PHYSICAL THERAPY DISCHARGE SUMMARY  Visits from Start of Care: 3  Current functional level  related to goals / functional outcomes: The patient cancelled last scheduled appt.  Doing well and requested discharge.    Remaining deficits: As above   Education / Equipment: HEP   Patient agrees to discharge. Patient goals were partially met.  Patient is being discharged due to being pleased with the current functional level.  Ruben Im, PT 01/06/22 9:56 AM Phone: [phone] Fax: [phone]

## 2021-11-14 ENCOUNTER — Ambulatory Visit (INDEPENDENT_AMBULATORY_CARE_PROVIDER_SITE_OTHER): Payer: Medicare PPO

## 2021-11-14 DIAGNOSIS — E538 Deficiency of other specified B group vitamins: Secondary | ICD-10-CM

## 2021-11-14 NOTE — Progress Notes (Signed)
Pt here for monthly B12 injection #8 of 12 per Dr. Sarajane Jews  B12 1077mg given IM right deltoid and pt tolerated injection well.  Pt to scheduled next B12 injection upon check out.  Dr BElease Hashimoto please cosign since PCP is out of office.

## 2021-11-14 NOTE — Addendum Note (Signed)
Addended by: Elza Rafter D on: 11/14/2021 11:50 AM   Modules accepted: Level of Service

## 2021-11-15 DIAGNOSIS — Z1231 Encounter for screening mammogram for malignant neoplasm of breast: Secondary | ICD-10-CM | POA: Diagnosis not present

## 2021-11-15 LAB — HM MAMMOGRAPHY

## 2021-11-22 ENCOUNTER — Ambulatory Visit (INDEPENDENT_AMBULATORY_CARE_PROVIDER_SITE_OTHER): Payer: Medicare PPO

## 2021-11-22 DIAGNOSIS — E538 Deficiency of other specified B group vitamins: Secondary | ICD-10-CM

## 2021-11-22 MED ORDER — CYANOCOBALAMIN 1000 MCG/ML IJ SOLN
1000.0000 ug | Freq: Once | INTRAMUSCULAR | Status: AC
  Administered 2021-11-22: 1000 ug via INTRAMUSCULAR

## 2021-11-22 NOTE — Progress Notes (Signed)
Pt here for monthly B12 injection per Dr Sarajane Jews.  B12 1049mg given IM, and pt tolerated injection well.  Next B12 injection scheduled for next week.

## 2021-12-02 ENCOUNTER — Other Ambulatory Visit: Payer: Self-pay

## 2021-12-02 ENCOUNTER — Telehealth: Payer: Self-pay | Admitting: Family Medicine

## 2021-12-02 MED ORDER — SERTRALINE HCL 100 MG PO TABS: 200.0000 mg | ORAL_TABLET | Freq: Every day | ORAL | 3 refills | Status: DC

## 2021-12-02 NOTE — Telephone Encounter (Signed)
Refill this for one year

## 2021-12-02 NOTE — Telephone Encounter (Signed)
Last refill- by historical provider Last OV- 10/13/21  No future OV scheduled.

## 2021-12-02 NOTE — Telephone Encounter (Signed)
Pt called to request a refill of the following:  sertraline (ZOLOFT) 100 MG tablet  LOV:  09/08/21 (CPE) LVV:  10/13/21   Please advise.  [id number] [place]  [phone] Fax:  [phone]

## 2021-12-02 NOTE — Telephone Encounter (Signed)
Lvm for patient  year supply sent to for Sertraline sent to [facility] on [address]

## 2021-12-06 ENCOUNTER — Ambulatory Visit: Payer: Medicare PPO | Admitting: Physical Therapy

## 2021-12-19 ENCOUNTER — Ambulatory Visit (INDEPENDENT_AMBULATORY_CARE_PROVIDER_SITE_OTHER): Payer: Medicare PPO

## 2021-12-19 DIAGNOSIS — E538 Deficiency of other specified B group vitamins: Secondary | ICD-10-CM | POA: Diagnosis not present

## 2021-12-19 MED ORDER — CYANOCOBALAMIN 1000 MCG/ML IJ SOLN
1000.0000 ug | Freq: Once | INTRAMUSCULAR | Status: AC
  Administered 2021-12-19: 1000 ug via INTRAMUSCULAR

## 2021-12-19 NOTE — Progress Notes (Signed)
Per orders of Dr. Sarajane Jews, injection of Cyanocobalamin 1000 mcg/mL given by Encarnacion Slates.  Patient tolerated injection well.  Patient next injection is due 12/26/2021. Appt scheduled.

## 2021-12-20 ENCOUNTER — Encounter
Admit: 2021-12-20 | Discharge: 2021-12-20 | Payer: MEDICARE | Attending: Cardiovascular Disease | Primary: Family Medicine

## 2021-12-20 DIAGNOSIS — I10 Essential (primary) hypertension: Secondary | ICD-10-CM

## 2021-12-20 MED ORDER — NEXLETOL 180 MG PO TABS
180 MG | ORAL_TABLET | Freq: Every day | ORAL | 4 refills | Status: AC
Start: 2021-12-20 — End: 2022-03-22

## 2021-12-20 NOTE — Progress Notes (Signed)
Review of Systems   Constitutional:  Negative for chills, fatigue, fever and unexpected weight change.   HENT:  Positive for voice change. Negative for hearing loss, nosebleeds, sore throat and tinnitus.    Eyes:  Negative for visual disturbance.   Respiratory:  Positive for cough and shortness of breath. Negative for apnea, chest tightness and wheezing.    Cardiovascular:  Negative for chest pain, palpitations and leg swelling.   Gastrointestinal:  Negative for anal bleeding, blood in stool, constipation and diarrhea.   Endocrine: Negative for polydipsia.   Genitourinary:  Negative for difficulty urinating, dysuria, hematuria and urgency.   Musculoskeletal:  Negative for myalgias.   Skin:  Negative for rash and wound.   Neurological:  Negative for dizziness, syncope, weakness, light-headedness and numbness.   Hematological:  Does not bruise/bleed easily.   Psychiatric/Behavioral:  Negative for sleep disturbance.

## 2021-12-20 NOTE — Progress Notes (Signed)
Date:  December 20, 2021  Patient name: Lorraine Freeman  Date of Birth: 07-02-1945    NEW PATIENT VISIT    HISTORY OF PRESENT ILLNESS            Lorraine Freeman is a 76 y.o. female who presents to cardiology clinic for evaluation of atherosclerosis and risk factors.  Patient had a calcium score performed due to her strong family history of heart disease and it was 30.  This is overall favorable as it put her in the lower 50% group for her age 31.  Her family history includes significant amount of atherosclerosis and abdominal aortic aneurysms.  She has been screened before in the past for a AAA and was negative.  She denies any chest pains or shortness of breath.  She does have an issue with vocal cord dysfunction since her lung surgery which when it occurs she loses her voice and does have a generalized shortness of breath and tightness in her chest.  She has no chest pains otherwise.  She has not to exercise because of the hip pain.  She had multiple intolerances to statins and has had a high cholesterol for many years.  She has failed atorvastatin, Crestor, and Zocor and Zetia due to muscle aches.          PAST MEDICAL, SOCIAL AND FAMILY HISTORY        Past Medical History:   Diagnosis Date    Asthma 1999    Drug effect Levaquin    Tachycardia, sob    Fibromyalgia     High cholesterol     Lung cancer ([place]) 1993    Osteoarthritis     Osteopenia     Sciatica     Spasmodic dysphonia     left vocal cord paralysis        Past Surgical History:   Procedure Laterality Date    BREAST REDUCTION SURGERY      CHOLECYSTECTOMY      ELBOW SURGERY Right     KNEE SURGERY  11/26/2018    Arthroscopy right knee with partial medial meniscectomy and chondroplasty Dr. Phillip Heal    LOBECTOMY  1980    apical resection    LUMBAR FUSION  2001    Dr. Benjaman Pott SPINE SURGERY   2003    hardware removal    SHOULDER ARTHROPLASTY Right 06/2012    Dr March Rummage    SHOULDER SURGERY Right 1996    Dr. Phillip Heal    SHOULDER SURGERY Right 2011    Dr. Phillip Heal    SKIN CANCER EXCISION      multiple    [place] Bilateral     ORIF, bilat wrist fxs    WRIST SURGERY  2007    both wrist    WRIST SURGERY  2005    broken wrist        Social History     Socioeconomic History    Marital status: Married     Spouse name: Not on file    Number of children: Not on file    Years of education: Not on file    Highest education level: Not on file   Occupational History    Not on file   Tobacco Use    Smoking status: Former     Packs/day: 1.00     Years: 10.00  Additional pack years: 0.00     Total pack years: 10.00     Types: Cigarettes     Start date: 06/19/1963     Quit date: 03/20/1977     Years since quitting: 44.7    Smokeless tobacco: Never   Vaping Use    Vaping Use: Never used   Substance and Sexual Activity    Alcohol use: Yes     Alcohol/week: 10.0 standard drinks of alcohol     Types: 10 Glasses of wine per week     Comment: 2/day    Drug use: Never    Sexual activity: Not Currently     Partners: Male   Other Topics Concern    Not on file   Social History Narrative    Not on file     Social Determinants of Health     Financial Resource Strain: Not on file   Food Insecurity: Not on file   Transportation Needs: Not on file   Physical Activity: Inactive (10/10/2021)    Exercise Vital Sign     Days of Exercise per Week: 0 days     Minutes of Exercise per Session: 0 min   Stress: Not on file   Social Connections: Not on file   Intimate Partner Violence: Not on file   Housing Stability: Not on file        Family History   Problem Relation Age of Onset    Colon Cancer Mother     Stroke Father     Abdominal aortic aneurysm Father     Diabetes Father     High Blood Pressure Father     Prostate Cancer Father     Hypertension Father     Heart  Surgery Brother     Heart Surgery Brother     Esophageal Cancer Brother     Abdominal aortic aneurysm Brother     Colon Cancer Maternal Grandmother     Breast Cancer Maternal Aunt     Cancer Son         tongue cancer          MEDICATIONS AND ALLERGIES           Prior to Admission medications    Medication Sig Start Date End Date Taking? Authorizing Provider   ALPRAZolam Duanne Moron) 0.25 MG tablet Take 1 tablet by mouth nightly as needed for Sleep. Max Daily Amount: 0.25 mg   Yes Historical Provider, MD   Bempedoic Acid (NEXLETOL) 180 MG TABS Take 1 tablet by mouth daily 12/20/21  Yes Tula Nakayama, MD   omeprazole (PRILOSEC) 40 MG delayed release capsule Take 1 capsule by mouth daily 10/10/21  Yes Reatha Armour, MD   clobetasol (TEMOVATE) 0.05 % ointment Apply topically 2 times daily. 06/28/21  Yes Oliver Pila, MD   MAGNESIUM PO Take by mouth   Yes Historical Provider, MD   BIOTIN PO Take by mouth   Yes Historical Provider, MD   estradiol (ESTRACE VAGINAL) 0.1 MG/GM vaginal cream Place 1 g vaginally daily Nightly x 2 weeks, every other night x 2 weeks, then as needed 04/11/21  Yes Oliver Pila, MD   albuterol sulfate HFA (PROVENTIL;VENTOLIN;PROAIR) 108 (90 Base) MCG/ACT inhaler Ventolin HFA 90 mcg/actuation aerosol inhaler 11/19/18  Yes Historical Provider, MD   vitamin D3 (CHOLECALCIFEROL) 125 MCG (5000 UT) TABS tablet   5,000 units = tabs, Oral, Daily, # 100 tabs, 0 Refill(s) 11/19/18  Yes Historical Provider, MD  calcium carbonate 1500 (600 Ca) MG TABS tablet Take 2 tablets by mouth daily as needed 11/19/18  Yes Historical Provider, MD   acetaminophen (TYLENOL) 325 MG tablet 1 tablet as needed Orally every 6 hrs   Yes [facility] Automatic Reconciliation, MD       Allergies:  Levaquin [levofloxacin], Dilaudid [hydromorphone], Flurbiprofen, and Nsaids        REVIEW OF SYSTEMS      Review of Systems   Constitutional:  Negative for chills, fatigue, fever and unexpected weight change.    HENT:  Positive for voice change. Negative for hearing loss, nosebleeds, sore throat and tinnitus.    Eyes:  Negative for visual disturbance.   Respiratory:  Positive for cough and shortness of breath. Negative for apnea, chest tightness and wheezing.    Cardiovascular:  Negative for chest pain, palpitations and leg swelling.   Gastrointestinal:  Negative for anal bleeding, blood in stool, constipation and diarrhea.   Endocrine: Negative for polydipsia.   Genitourinary:  Negative for difficulty urinating, dysuria, hematuria and urgency.   Musculoskeletal:  Negative for myalgias.   Skin:  Negative for rash and wound.   Neurological:  Negative for dizziness, syncope, weakness, light-headedness and numbness.   Hematological:  Does not bruise/bleed easily.   Psychiatric/Behavioral:  Negative for sleep disturbance.           PHYSICAL EXAM          BP 122/80 (Site: Left Upper Arm, Position: Sitting, Cuff Size: Large Adult)   Pulse 79   Resp 19   Ht 5\' 3"  (1.6 m)   Wt 172 lb (78 kg)   LMP  (LMP Unknown)   SpO2 98%   BMI 30.47 kg/m      Gen:  Alert and oriented 3 in no apparent distress  HEENT: supple, non-tender  Cardiovascular: Regular rate and rhythm, normal S1/2, no murmurs, no JVD  Lungs: Clear to auscultation bilaterally  Abdomen: Nontender nondistended  Extremities: No clubbing cyanosis or edema  Neuro:  No focal deficits; ambulated into the office today without issue  Psych:  Appropriate mood and affect  Derm:  No new rashes or lesions        CARDIAC DIAGNOSTICS          Encounter Date: 12/20/21   EKG 12 Lead    Narrative    Normal sinus rhythm with nonspecific changes   I have been made  Lab Results   Component Value Date    CHOL 252 (H) 10/10/2021    TRIG 131 10/10/2021    HDL 64 10/10/2021    LDLCHOLESTEROL 162.0 (H) 10/10/2021    VLDL 26.2 10/10/2021    CHOLHDLRATIO 3.9 10/10/2021        Lab Results   Component Value Date    ALT 23 10/10/2021    AST 21 10/10/2021    ALKPHOS 81 10/10/2021    BILITOT  0.55 10/10/2021        Lab Results   Component Value Date/Time    NA 140 10/10/2021 10:40 AM    K 4.7 10/10/2021 10:40 AM    CL 103 10/10/2021 10:40 AM    CO2 25 10/10/2021 10:40 AM    BUN 19 10/10/2021 10:40 AM    CREATININE 0.8 10/10/2021 10:40 AM    GLUCOSE 106 10/10/2021 10:40 AM    CALCIUM 8.3 10/10/2021 10:40 AM    LABGLOM 78 10/10/2021 10:40 AM           ASSESSMENT AND RECOMMENDATIONS  1. Essential hypertension  -     EKG 12 Lead  2. Coronary artery calcification seen on CAT scan  -     Bempedoic Acid (NEXLETOL) 180 MG TABS; Take 1 tablet by mouth daily, Disp-30 tablet, R-4Normal  3. Hypercholesteremia  -     Bempedoic Acid (NEXLETOL) 180 MG TABS; Take 1 tablet by mouth daily, Disp-30 tablet, R-4Normal     Patient has a calcium score 30 which overall is favorable for her age and puts her in the lower 50% age sex match.  Meaning more than 50% of the population her age and gender have more calcification.  However she does have a strong family history of atherosclerosis and has elevated cholesterol level.  I do think we should be aggressive with our prevention strategy at this point in time.  She does not have an indication for further testing with stress test at this point as she is relatively asymptomatic with exertion.  I would recommend that we try alternative nonstatin based agents such as PCSK9 inhibitor or Nexletol.  In discussion of the pros and cons she would like to try the Nexletol 180 mg daily and we will recheck a lipid panel in 3 to 4 months with follow-up to monitor progress.    Return in about 4 months (around 04/22/2022).     Thank you for allowing me to participate in the care of your patient.      Tula Nakayama, [facility]

## 2021-12-21 NOTE — Telephone Encounter (Signed)
Lorraine Freeman is calling from Medicare part B from [place] The number is [phone]  Needing clinical question answer for medication Nexletol  180 mg   Case number is  [id number]

## 2021-12-21 NOTE — Telephone Encounter (Signed)
Doing PA VIA covermymeds.    TJ

## 2021-12-26 ENCOUNTER — Ambulatory Visit (INDEPENDENT_AMBULATORY_CARE_PROVIDER_SITE_OTHER): Payer: Medicare PPO

## 2021-12-26 DIAGNOSIS — E538 Deficiency of other specified B group vitamins: Secondary | ICD-10-CM

## 2021-12-26 MED ORDER — CYANOCOBALAMIN 1000 MCG/ML IJ SOLN
1000.0000 ug | Freq: Once | INTRAMUSCULAR | Status: AC
  Administered 2021-12-26: 1000 ug via INTRAMUSCULAR

## 2021-12-26 NOTE — Progress Notes (Signed)
Pt here for monthly B12 injection per Dr Sarajane Jews  B12 1051mg given IM, and pt tolerated injection well.  Next B12 injection scheduled for next week.

## 2022-01-02 ENCOUNTER — Ambulatory Visit (INDEPENDENT_AMBULATORY_CARE_PROVIDER_SITE_OTHER): Payer: Medicare PPO

## 2022-01-02 DIAGNOSIS — E538 Deficiency of other specified B group vitamins: Secondary | ICD-10-CM | POA: Diagnosis not present

## 2022-01-02 MED ORDER — CYANOCOBALAMIN 1000 MCG/ML IJ SOLN
1000.0000 ug | INTRAMUSCULAR | Status: AC
  Administered 2022-01-02: 1000 ug via INTRAMUSCULAR

## 2022-01-02 NOTE — Progress Notes (Signed)
Pt here for weekly B12 injection #11 of 12 per Dr Sarajane Jews.  B12 103mg given IM left deltoid and pt tolerated injection well.  Pt will call to schedule next b12 injection.

## 2022-01-09 ENCOUNTER — Other Ambulatory Visit: Payer: Self-pay | Admitting: Family Medicine

## 2022-01-10 ENCOUNTER — Encounter: Payer: Self-pay | Admitting: Family Medicine

## 2022-01-11 NOTE — Telephone Encounter (Signed)
Yes she should get all 3 of these, but separate them out with 2 week sin between each

## 2022-01-13 ENCOUNTER — Ambulatory Visit (INDEPENDENT_AMBULATORY_CARE_PROVIDER_SITE_OTHER): Payer: Medicare PPO

## 2022-01-13 DIAGNOSIS — E538 Deficiency of other specified B group vitamins: Secondary | ICD-10-CM | POA: Diagnosis not present

## 2022-01-13 MED ORDER — CYANOCOBALAMIN 1000 MCG/ML IJ SOLN
1000.0000 ug | Freq: Once | INTRAMUSCULAR | Status: AC
  Administered 2022-01-13: 1000 ug via INTRAMUSCULAR

## 2022-01-13 NOTE — Progress Notes (Signed)
Per orders of Dr. Sarajane Jews, injection of Cyanocobalamin 1000 mcg given by Encarnacion Slates. Patient tolerated injection well.

## 2022-01-16 ENCOUNTER — Encounter: Payer: Self-pay | Admitting: Family Medicine

## 2022-01-16 ENCOUNTER — Ambulatory Visit: Payer: Medicare PPO | Admitting: Family Medicine

## 2022-01-16 VITALS — BP 130/80 | HR 65 | Temp 97.9°F | Wt 161.0 lb

## 2022-01-16 DIAGNOSIS — E538 Deficiency of other specified B group vitamins: Secondary | ICD-10-CM | POA: Diagnosis not present

## 2022-01-16 DIAGNOSIS — R29898 Other symptoms and signs involving the musculoskeletal system: Secondary | ICD-10-CM | POA: Diagnosis not present

## 2022-01-16 DIAGNOSIS — R251 Tremor, unspecified: Secondary | ICD-10-CM | POA: Diagnosis not present

## 2022-01-16 DIAGNOSIS — H538 Other visual disturbances: Secondary | ICD-10-CM | POA: Diagnosis not present

## 2022-01-16 DIAGNOSIS — R2689 Other abnormalities of gait and mobility: Secondary | ICD-10-CM

## 2022-01-16 LAB — VITAMIN B12: Vitamin B-12: >1500 pg/mL — ABNORMAL HIGH (ref 211–911)

## 2022-01-16 NOTE — Progress Notes (Signed)
   Subjective:    Patient ID: Maria Schneider, female    DOB: 10-21-1945, 76 y.o.   MRN: [id number]  HPI Here to follow up on B12 deficiency but also to discuss symptoms that just appeared 2-3 weeks ago. These symptoms come and go, and this morning she has very few of them. She describes trouble with balance, and when she is walking and suddenly changes direction she has to hold onto something to keep from falling. She has some weakness in the left leg such that her left foot drags when she walks. The vision in both eyes is blurry at times. She has a resting tremor in the left hand. She has a stabbing pain in the left great toe. She also has trouble pronouncing words at times. No family hx of neurologic conditions. She just took the last shot of a weekly set of 12 B12 shots last week. Her level was 91 on 09-08-21.    Review of Systems  Constitutional: Negative.   HENT: Negative.    Eyes:  Positive for visual disturbance.  Respiratory: Negative.    Cardiovascular: Negative.   Gastrointestinal: Negative.   Genitourinary: Negative.   Neurological:  Positive for tremors, speech difficulty, weakness and headaches. Negative for dizziness, seizures, syncope, facial asymmetry, light-headedness and numbness.       Objective:   Physical Exam Constitutional:      Appearance: Normal appearance.     Comments: Her gait is normal. Her balance is normal standing still with eyes closed. No tremors. Motor is 4/4 in extremities. Speech is normal.   Neurological:     Mental Status: She is alert.           Assessment & Plan:  She has developed a number of neurologic symptoms recently, and certainly a low H67 level could be contributing to this. However after 12 straight weeks of B12 shots, this should be normal by now. We will check a B12 level today. If this is normal, we anticipate referring her to Neurology.  [name], MD

## 2022-01-20 ENCOUNTER — Other Ambulatory Visit: Payer: Medicare PPO

## 2022-01-30 ENCOUNTER — Encounter

## 2022-01-30 MED ORDER — OMEPRAZOLE 40 MG PO CPDR
40 MG | ORAL_CAPSULE | Freq: Every day | ORAL | 1 refills | Status: DC
Start: 2022-01-30 — End: 2022-07-24

## 2022-01-30 MED ORDER — ALPRAZOLAM 0.25 MG PO TABS
0.25 MG | ORAL_TABLET | Freq: Every evening | ORAL | 0 refills | Status: AC | PRN
Start: 2022-01-30 — End: 2022-03-01

## 2022-01-30 NOTE — Telephone Encounter (Signed)
Patient got letter that you're leaving. She can't get an appt with a new provider until May. She need refills until she can see new pcp.  Requested Prescriptions     Pending Prescriptions Disp Refills    ALPRAZolam (XANAX) 0.25 MG tablet 90 tablet 1     Sig: Take 1 tablet by mouth nightly as needed for Sleep. Max Daily Amount: 0.25 mg    omeprazole (PRILOSEC) 40 MG delayed release capsule 90 capsule 1     Sig: Take 1 capsule by mouth daily

## 2022-02-12 ENCOUNTER — Encounter: Payer: Self-pay | Admitting: Family Medicine

## 2022-02-12 DIAGNOSIS — R2689 Other abnormalities of gait and mobility: Secondary | ICD-10-CM

## 2022-02-12 DIAGNOSIS — R29898 Other symptoms and signs involving the musculoskeletal system: Secondary | ICD-10-CM

## 2022-02-12 DIAGNOSIS — R479 Unspecified speech disturbances: Secondary | ICD-10-CM

## 2022-02-14 NOTE — Telephone Encounter (Signed)
I did the referral 

## 2022-02-15 ENCOUNTER — Encounter: Payer: Self-pay | Admitting: Neurology

## 2022-02-15 NOTE — Telephone Encounter (Signed)
Patient said her rx for Xanax should be twice a day. It was sent in for once a day. Please send new rx for twice a day.

## 2022-02-17 NOTE — Telephone Encounter (Signed)
Pt informed

## 2022-02-21 ENCOUNTER — Ambulatory Visit: Payer: Medicare PPO | Admitting: Neurology

## 2022-03-03 NOTE — Progress Notes (Unsigned)
NEUROLOGY CONSULTATION NOTE  [name] MRN: [id number] DOB: 14-Dec-1945  Referring provider: [name], MD Primary care provider: [name], MD  Reason for consult:  balance problems, lower extremity weakness, speech problems  Assessment/Plan:   Memory deficits Balance disorder Unclear etiology at this time.  Overall, neurologic exam not too remarkable.  Will check MRI of brain Further recommendations pending results.  Otherwise, will have her follow up in 6 months for re-evaluation.   Subjective:  Maria Schneider is a 76 year old female with CAD, HTN, HLD who presents for balance difficulty, left leg weakness, speech difficulty and blurred vision.  History supplemented by her accompanying husband an referring provider's note.  She began experiencing balance problems about 6 months ago.  She describes it mostly as a problem with depth perception.  She has cataracts.  She may walk into things.  She also feels like she is dragging her left leg.  At first she thought it was related to having had bilateral hip replacement but it has gotten worse.  If she turns too quickly or bends over, she may have vertigo for a few seconds.  Sometimes she notices brief vertical diplopia.  She finds that she chokes more frequently.  She also reports increased problems with short term memory over the past couple of months.  She will quickly forget conversations after 10 minutes.  She may need to think a moment to remember things that day, such as what she ate for breakfast.  If she is talking and is interrupted, she easily loses her train of thought and has difficulty redirecting to her conversation.  Sometimes she has difficulty pronouncing words.  Denies neck and back pain.  Denies numbness in the feet.  Sometimes if she is holding something in her left hand, it will shake.  Usually that occurs after she has been leaning on her elbow for a while.    TSH was 3.06.  She was found to have a B12 level  of 91 in June, but symptoms persisted despite injections.  Recently, her sister passed away unexpectedly, which may be contributing to increased emotional stress that could be affecting symptoms such as memory.     PAST MEDICAL HISTORY: Past Medical History:  Diagnosis Date   Allergy    Arthritis    Asthma    Cancer ([place])    hx skin cancer   Depression    Detrusor instability    Diverticulosis    not symptomatic   GERD (gastroesophageal reflux disease)    History of kidney stones    History of pancreatitis    April 2015   History of skin cancer    Hyperlipidemia    Hypertension    Osteopenia    Urinary incontinence    Vitamin D deficiency    RESOLVED     PAST SURGICAL HISTORY: Past Surgical History:  Procedure Laterality Date   ABDOMINAL HYSTERECTOMY  1994   TAH/ BLADDER SUSPENSION   BREAST BIOPSY  1972   benign   CHOLECYSTECTOMY N/A 07/12/2013   Procedure: LAPAROSCOPIC CHOLECYSTECTOMY WITH INTRAOPERATIVE CHOLANGIOGRAM;  Surgeon: Edward Jolly, MD;  Location: [facility];  Service: General;  Laterality: N/A;   COLONOSCOPY  06-24-12   per Dr. Earlean Shawl, benign polyps, repeat in 5 yrs     ERCP N/A 07/14/2013   Procedure: ENDOSCOPIC RETROGRADE CHOLANGIOPANCREATOGRAPHY (ERCP);  Surgeon: Gatha Mayer, MD;  Location: [facility];  Service: Endoscopy;  Laterality: N/A;   TONSILLECTOMY  TOTAL HIP ARTHROPLASTY Right 01/13/2015   Procedure: RIGHT TOTAL HIP ARTHROPLASTY ANTERIOR APPROACH;  Surgeon: Gaynelle Arabian, MD;  Location: [facility];  Service: Orthopedics;  Laterality: Right;   TOTAL HIP ARTHROPLASTY Left 11/07/2017   Procedure: LEFT TOTAL HIP ARTHROPLASTY ANTERIOR APPROACH;  Surgeon: Gaynelle Arabian, MD;  Location: [facility];  Service: Orthopedics;  Laterality: Left;   TUBAL LIGATION      MEDICATIONS: Current Outpatient Medications on File Prior to Visit  Medication Sig Dispense Refill   aspirin EC 81 MG tablet Take 1 tablet (81 mg total) by mouth daily. Swallow whole. 90  tablet 3   buPROPion (WELLBUTRIN XL) 300 MG 24 hr tablet TAKE 1 TABLET BY MOUTH EVERY DAY 90 tablet 3   Cholecalciferol (VITAMIN D3) 25 MCG (1000 UT) CAPS Take 5,000 Units by mouth daily.      Evolocumab (REPATHA SURECLICK) 732 MG/ML SOAJ Inject 140 mg into the skin every 14 (fourteen) days. 2 mL 11   ezetimibe (ZETIA) 10 MG tablet TAKE 1 TABLET BY MOUTH EVERY DAY 90 tablet 0   famotidine (PEPCID) 20 MG tablet Take 1 tablet (20 mg total) by mouth daily. (Patient taking differently: Take 40 mg by mouth daily. Taking 40 mg) 1 tablet 0   fenofibrate 160 MG tablet TAKE 1 TABLET BY MOUTH EVERY DAY 90 tablet 0   metoprolol succinate (TOPROL-XL) 25 MG 24 hr tablet TAKE 1 TABLET (25 MG TOTAL) BY MOUTH DAILY. 90 tablet 0   MYRBETRIQ 50 MG TB24 tablet Take 50 mg by mouth daily.  11   sertraline (ZOLOFT) 100 MG tablet Take 2 tablets (200 mg total) by mouth daily. 90 tablet 3   valsartan (DIOVAN) 80 MG tablet TAKE 1 TABLET BY MOUTH EVERY DAY 90 tablet 0   No current facility-administered medications on file prior to visit.    ALLERGIES: Allergies  Allergen Reactions   Ace Inhibitors     REACTION: cough   Codeine Nausea And Vomiting   Fentanyl Itching   Statins     REACTION: MYALGIAS-muscle pain    FAMILY HISTORY: Family History  Problem Relation Age of Onset   Hypertension Mother    Osteoporosis Mother    Heart disease Father    Osteoporosis Sister    Breast cancer Paternal Aunt        Age 71's   Diabetes Paternal Uncle     Objective:  *** General: No acute distress.  Patient appears well-groomed.   Head:  Normocephalic/atraumatic Eyes:  fundi examined but not visualized Neck: supple, no paraspinal tenderness, full range of motion Back: No paraspinal tenderness Heart: regular rate and rhythm Neurological Exam: Mental status: alert and oriented to person, place, and time, speech fluent and not dysarthric, language intact.    03/06/2022    1:00 PM  Montreal Cognitive Assessment    Visuospatial/ Executive (0/5) 4  Naming (0/3) 3  Attention: Read list of digits (0/2) 2  Attention: Read list of letters (0/1) 1  Attention: Serial 7 subtraction starting at 100 (0/3) 3  Language: Repeat phrase (0/2) 2  Language : Fluency (0/1) 1  Abstraction (0/2) 2  Delayed Recall (0/5) 1  Orientation (0/6) 6  Total 25  Adjusted Score (based on education) 25   Cranial nerves: CN I: not tested CN II: pupils equal, round and reactive to light, visual fields intact CN III, IV, VI:  full range of motion, no nystagmus, no ptosis CN V: facial sensation intact. CN VII: upper and lower face symmetric CN  VIII: hearing intact CN IX, X: gag intact, uvula midline CN XI: sternocleidomastoid and trapezius muscles intact CN XII: tongue midline Bulk & Tone: normal, no fasciculations. Motor:  muscle strength 5/5 throughout Sensation:  Pinprick, temperature and vibratory sensation intact. Deep Tendon Reflexes:  2+ throughout,  toes downgoing.   Finger to nose testing:  Without dysmetria.   Heel to shin:  Without dysmetria.   Gait:  Normal station and stride.  Romberg negative.    Thank you for allowing me to take part in the care of this patient.  Metta Clines, DO  CC: [name], MD

## 2022-03-06 ENCOUNTER — Encounter: Payer: Self-pay | Admitting: Neurology

## 2022-03-06 ENCOUNTER — Ambulatory Visit: Payer: Medicare PPO | Admitting: Neurology

## 2022-03-06 VITALS — BP 158/95 | HR 66 | Ht 65.0 in | Wt 164.4 lb

## 2022-03-06 DIAGNOSIS — R2689 Other abnormalities of gait and mobility: Secondary | ICD-10-CM | POA: Diagnosis not present

## 2022-03-06 DIAGNOSIS — R413 Other amnesia: Secondary | ICD-10-CM | POA: Diagnosis not present

## 2022-03-06 NOTE — Patient Instructions (Signed)
Will check MRI of brain Further recommendations pending results.  Otherwise, plan to follow up in 6 months.

## 2022-03-07 ENCOUNTER — Encounter

## 2022-03-07 NOTE — Telephone Encounter (Signed)
Requested Prescriptions     Pending Prescriptions Disp Refills    ALPRAZolam (XANAX) 0.25 MG tablet 180 tablet 1     Sig: Take 1 tablet by mouth 2 times daily for 180 days. Max Daily Amount: 0.5 mg

## 2022-03-08 MED ORDER — ALPRAZOLAM 0.25 MG PO TABS
0.25 MG | ORAL_TABLET | Freq: Two times a day (BID) | ORAL | 0 refills | Status: AC | PRN
Start: 2022-03-08 — End: 2022-04-19

## 2022-03-16 ENCOUNTER — Encounter

## 2022-03-16 NOTE — Telephone Encounter (Signed)
Requested Prescriptions     Pending Prescriptions Disp Refills    ALPRAZolam (XANAX) 0.25 MG tablet 60 tablet 0     Sig: Take 1 tablet by mouth 2 times daily as needed (vocal cord paralysis) for up to 30 days. Max Daily Amount: 0.5 mg

## 2022-03-17 NOTE — Telephone Encounter (Signed)
I am still confused.  She has always only gotten 30 pills every 30 days.  She should continue the amount she is typically using.  It was last filled on 12/15.  I will not refill this.  There is a pending refill for 1/1 that was sent in by catherine. She can discuss with catherine as I am not going to increase her quantity

## 2022-03-17 NOTE — Telephone Encounter (Signed)
Phar stated they filled a script in July and it ws written for 30 pills  They do not have a new script on her  Please advise

## 2022-03-17 NOTE — Telephone Encounter (Signed)
Pt will call Lorraine Freeman on Tuesday   She wants 60 pills    Refill on 03/20/22

## 2022-03-17 NOTE — Telephone Encounter (Signed)
Vv visit with Barnetta Chapel on Wed.  Pt will go to er if needs pills

## 2022-03-17 NOTE — Telephone Encounter (Signed)
This was called in by [place] on 12/20 to be filled on 1/1.   She last filled this on 12/15.

## 2022-03-22 ENCOUNTER — Telehealth: Admit: 2022-03-22 | Discharge: 2022-03-22 | Payer: MEDICARE | Attending: Family Medicine | Primary: Family Medicine

## 2022-03-22 DIAGNOSIS — J3800 Paralysis of vocal cords and larynx, unspecified: Secondary | ICD-10-CM

## 2022-03-22 MED ORDER — ALPRAZOLAM 0.25 MG PO TABS
0.25 MG | ORAL_TABLET | Freq: Two times a day (BID) | ORAL | 0 refills | Status: DC | PRN
Start: 2022-03-22 — End: 2022-09-06

## 2022-03-22 NOTE — Progress Notes (Signed)
This is a virtual face-to-face visit and verbal consent has been obtained from the patient.  Virtual visit video component was used.  Platform used for [employer] with video component     Lorraine Freeman was evaluated through a synchronous (real-time) audio-video encounter. The patient (or guardian if applicable) is aware that this is a billable service, which includes applicable co-pays. This Virtual Visit was conducted with patient's (and/or legal guardian's) consent. The visit was conducted pursuant to the emergency declaration under the [place], [place] waiver authority and the [employer] Act.  Patient identification was verified, and a caregiver was present when appropriate. The patient was located at Home: [address]  [place] [address] [place] [address]. Provider was located at Other: [place], [place]      CHIEF COMPLAINT:  Chief Complaint   Patient presents with    Medication Refill     re alprazalom        HISTORY OF PRESENT ILLNESS:  Lorraine Freeman is a 77 y.o. female  who presents for follow up.     She has been out of her alprazolam she takes for vocal cord paralysis. She tells me that her alprazolam was sent to [facility] instead of [facility]. Her voice is extremely raspy during this virtual visit. History shared by pt: She tells me she had lung cancer, with lower lobe removed (1993). She had pleurodesis prior to that. 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 She then developed left sided vocal cord paralysis.    She requests referral to pulmonologist for lung ca history and restrictive airway disease.    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     PHQ:      03/21/2022     5:07 PM   PHQ-9    Little interest or pleasure in doing things 0   Feeling down, depressed, or hopeless 0   PHQ-2 Score 0   PHQ-9 Total Score 0       CURRENT MEDICATION LIST:    Current Outpatient  Medications   Medication Sig Dispense Refill    ALPRAZolam (XANAX) 0.25 MG tablet Take 2 tablets by mouth 2 times daily as needed (vocal cord paralysis) for up to 90 days. Max Daily Amount: 1 mg 360 tablet 0    omeprazole (PRILOSEC) 40 MG delayed release capsule Take 1 capsule by mouth daily 90 capsule 1    Bempedoic Acid (NEXLETOL) 180 MG TABS Take 1 tablet by mouth daily 30 tablet 4    clobetasol (TEMOVATE) 0.05 % ointment Apply topically 2 times daily. 60 g 3    MAGNESIUM PO Take by mouth      BIOTIN PO Take by mouth      estradiol (ESTRACE VAGINAL) 0.1 MG/GM vaginal cream Place 1 g vaginally daily Nightly x 2 weeks, every other night x 2 weeks, then as needed 42.5 g 3    albuterol sulfate HFA (PROVENTIL;VENTOLIN;PROAIR) 108 (90 Base) MCG/ACT inhaler Ventolin HFA 90 mcg/actuation aerosol inhaler      vitamin D3 (CHOLECALCIFEROL) 125 MCG (5000 UT) TABS tablet   5,000 units = tabs, Oral, Daily, # 100 tabs, 0 Refill(s)      calcium carbonate 1500 (600 Ca) MG TABS tablet Take 2 tablets by mouth daily as needed      acetaminophen (TYLENOL) 325 MG tablet 1 tablet as needed Orally every 6 hrs       No current  facility-administered medications for this visit.        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 Ago    Erosive gastritis    High cholesterol     Lung cancer ([place]) 1993    Obesity     Osteoarthritis     Osteopenia     Restrictive lung disease 03/22/2022    Sciatica      Spasmodic dysphonia     left vocal cord paralysis      Past Surgical History:   Procedure Laterality Date    BREAST REDUCTION SURGERY      CHOLECYSTECTOMY      ELBOW SURGERY Right     EYE SURGERY      Cataracts, laser    FRACTURE SURGERY      Both wrists    JOINT REPLACEMENT      R shoulder, R ulna    KNEE ARTHROPLASTY      Torn meniscus    KNEE SURGERY  11/26/2018    Arthroscopy right knee with partial medial meniscectomy and chondroplasty Dr. Phillip Heal    LOBECTOMY  1980    apical resection    LUMBAR FUSION  2001    Dr. Benjaman Pott SPINE SURGERY  2003    hardware removal    SHOULDER ARTHROPLASTY Right 06/2012    Dr March Rummage    SHOULDER SURGERY Right 1996    Dr. Phillip Heal    SHOULDER SURGERY Right 2011    Dr. Phillip Heal    SKIN CANCER EXCISION      multiple    [facility]  03-2021    Erosive gastritis    WRIST SURGERY Bilateral     ORIF, bilat wrist fxs    WRIST SURGERY  2007    both wrist    WRIST SURGERY  2005    broken wrist      Social History     Socioeconomic History    Marital status: Married     Spouse name: Not on file    Number of children: Not on file    Years of education: Not on file    Highest education level: Not on file   Occupational History    Not on file   Tobacco Use    Smoking status: Former     Current packs/day: 0.00     Average packs/day: 1 pack/day for 13.8 years (13.8 ttl pk-yrs)     Types: Cigarettes     Start date: 06/19/1963     Quit date: 03/20/1977     Years since quitting: 41.0  Smokeless tobacco: Never   Vaping Use    Vaping Use: Never used   Substance and Sexual Activity    Alcohol use: Not Currently     Alcohol/week: 10.0 standard drinks of alcohol     Types: 10 Glasses of wine per week     Comment: 2/day    Drug use: Never    Sexual activity: Not Currently     Partners: Male   Other Topics Concern    Not on file   Social History Narrative    Not on file     Social Determinants of Health      Financial Resource Strain: Not on file   Food Insecurity: Not on file   Transportation Needs: Not on file   Physical Activity: Inactive (10/10/2021)    Exercise Vital Sign     Days of Exercise per Week: 0 days     Minutes of Exercise per Session: 0 min   Stress: Not on file   Social Connections: Not on file   Intimate Partner Violence: Not on file   Housing Stability: Not on file      Family History   Problem Relation Age of Onset    Colon Cancer Mother     Stroke Father     Abdominal aortic aneurysm Father     Diabetes Father     High Blood Pressure Father     Prostate Cancer Father     Hypertension Father     Heart Surgery Brother     Heart Surgery Brother     Arthritis Brother         Back,legs,shoulder    High Cholesterol Brother     Esophageal Cancer Brother     Cancer Brother         Tongue    Abdominal aortic aneurysm Brother     Cancer Brother         Adenoid    Colon Cancer Maternal Grandmother     Breast Cancer Maternal Aunt         Breast surgery    Cancer Son         tongue cancer            PHYSICAL EXAM:  GENERAL APPEARANCE: well developed, well nourished  NEUROLOGIC:  nonfocal, alert and oriented.   PSYCH: Judgment and insight good, mood/affect appropriate       Vital Signs -   There were no vitals taken for this visit.     LABS  No results found for this visit on 03/22/22.  Hospital Outpatient Visit on 10/24/2021   Component Date Value Ref Range Status    Right arm BP 10/24/2021 127  mmHg Final    Left arm BP 10/24/2021 123  mmHg Final    Right posterior tibial 10/24/2021 156  mmHg Final    Right dorsalis pedis BP 10/24/2021 155  mmHg Final    Right ABI 10/24/2021 1.23   Final    Right toe pressure 10/24/2021 137  mmHg Final    Right TBI 10/24/2021 1.08   Final    Left posterior tibial 10/24/2021 151  mmHg Final    Left dorsalis pedis BP 10/24/2021 128  mmHg Final    Left ABI 10/24/2021 1.19   Final    Left toe pressure 10/24/2021 128  mmHg Final    Left TBI 10/24/2021 1.01   Final        IMPRESSION/PLAN    1. Vocal cord paralysis  Comments:  Exacerbated; increasing benzo to reestablish condition control  Orders:  -     ALPRAZolam (XANAX) 0.25 MG tablet; Take 2 tablets by mouth 2 times daily as needed (vocal cord paralysis) for up to 90 days. Max Daily Amount: 1 mg, Disp-360 tablet, R-0Normal  2. Adenoid cystic carcinoma of left lung [facility])  Overview:  DX'D 56  Orders:  -     Cristopher Peru, MD - Pulmonology  -     CT CHEST W CONTRAST; Future  3. Restrictive lung disease  -     Cristopher Peru, MD - Pulmonology       Follow up and Dispositions:  Return if symptoms worsen or fail to improve.       Docia Barrier, APRN - NP

## 2022-03-28 ENCOUNTER — Telehealth

## 2022-03-28 NOTE — Telephone Encounter (Signed)
Pt requested an order for mammogram

## 2022-03-29 ENCOUNTER — Inpatient Hospital Stay: Admit: 2022-03-29 | Payer: MEDICARE | Primary: Family Medicine

## 2022-03-29 DIAGNOSIS — C3492 Malignant neoplasm of unspecified part of left bronchus or lung: Secondary | ICD-10-CM

## 2022-03-29 MED ORDER — IOPAMIDOL 61 % IV SOLN
61 % | Freq: Once | INTRAVENOUS | Status: AC | PRN
Start: 2022-03-29 — End: 2022-03-29
  Administered 2022-03-29: 22:00:00 100 mL via INTRAVENOUS

## 2022-03-29 NOTE — Other (Signed)
Results noted. Message sent to patient via Donna.

## 2022-03-29 NOTE — Telephone Encounter (Signed)
Order has been placed.

## 2022-03-31 ENCOUNTER — Telehealth: Payer: Self-pay

## 2022-03-31 NOTE — Telephone Encounter (Signed)
Unsuccessful attempt to reach patient x3 on preferred number listed in notes for scheduled AWV. Left message on voicemail okay to reschedule. 

## 2022-04-01 ENCOUNTER — Ambulatory Visit: Payer: Medicare PPO

## 2022-04-03 ENCOUNTER — Encounter

## 2022-04-03 ENCOUNTER — Inpatient Hospital Stay: Admit: 2022-04-03 | Payer: MEDICARE | Attending: Obstetrics & Gynecology | Primary: Family Medicine

## 2022-04-03 DIAGNOSIS — Z1231 Encounter for screening mammogram for malignant neoplasm of breast: Secondary | ICD-10-CM

## 2022-04-04 ENCOUNTER — Ambulatory Visit: Payer: Medicare PPO | Admitting: Internal Medicine

## 2022-04-05 ENCOUNTER — Ambulatory Visit: Payer: Medicare PPO | Admitting: Family Medicine

## 2022-04-05 ENCOUNTER — Encounter: Payer: Self-pay | Admitting: Family Medicine

## 2022-04-05 VITALS — BP 120/74 | HR 68 | Temp 97.6°F | Wt 161.0 lb

## 2022-04-05 DIAGNOSIS — J029 Acute pharyngitis, unspecified: Secondary | ICD-10-CM | POA: Diagnosis not present

## 2022-04-05 DIAGNOSIS — R059 Cough, unspecified: Secondary | ICD-10-CM

## 2022-04-05 DIAGNOSIS — J069 Acute upper respiratory infection, unspecified: Secondary | ICD-10-CM

## 2022-04-05 LAB — POCT INFLUENZA A/B
Influenza A, POC: NEGATIVE
Influenza B, POC: NEGATIVE

## 2022-04-05 LAB — POCT RAPID STREP A (OFFICE): Rapid Strep A Screen: NEGATIVE

## 2022-04-05 NOTE — Progress Notes (Signed)
   Subjective:    Patient ID: Maria Schneider, female    DOB: 1945/06/09, 77 y.o.   MRN: [id number]  HPI Here for one week of ST, fatigue, dry cough, and hoarse voice. No fever or SOB. She has tested negative for Covid twice at home. Her husband feels fine.    Review of Systems  Constitutional: Negative.   HENT:  Positive for congestion, postnasal drip, sore throat and voice change. Negative for ear pain and sinus pressure.   Eyes: Negative.   Respiratory:  Positive for cough. Negative for shortness of breath and wheezing.        Objective:   Physical Exam Constitutional:      Appearance: Normal appearance. She is not ill-appearing.  HENT:     Right Ear: Tympanic membrane, ear canal and external ear normal.     Left Ear: Tympanic membrane, ear canal and external ear normal.     Nose: Nose normal.     Mouth/Throat:     Pharynx: Oropharynx is clear.     Comments: Her voice is hoarse  Eyes:     Conjunctiva/sclera: Conjunctivae normal.  Pulmonary:     Effort: Pulmonary effort is normal.     Breath sounds: Normal breath sounds.  Lymphadenopathy:     Cervical: No cervical adenopathy.  Neurological:     Mental Status: She is alert.           Assessment & Plan:  Viral URI. She will rest and drink fluids. Add delsym as needed. [name], MD

## 2022-04-05 NOTE — Addendum Note (Signed)
Addended by: Wyvonne Lenz on: 04/05/2022 09:39 AM   Modules accepted: Orders

## 2022-04-06 ENCOUNTER — Other Ambulatory Visit: Payer: Self-pay | Admitting: Internal Medicine

## 2022-04-06 DIAGNOSIS — I2510 Atherosclerotic heart disease of native coronary artery without angina pectoris: Secondary | ICD-10-CM

## 2022-04-10 ENCOUNTER — Encounter: Payer: Self-pay | Admitting: Family Medicine

## 2022-04-10 ENCOUNTER — Other Ambulatory Visit (HOSPITAL_COMMUNITY): Payer: Self-pay

## 2022-04-10 MED ORDER — METHYLPREDNISOLONE 4 MG PO TBPK: ORAL_TABLET | ORAL | 0 refills | Status: DC

## 2022-04-10 NOTE — Telephone Encounter (Signed)
I will send in a prednisone taper pack

## 2022-04-12 ENCOUNTER — Ambulatory Visit: Payer: MEDICARE | Primary: Family Medicine

## 2022-04-12 DIAGNOSIS — R928 Other abnormal and inconclusive findings on diagnostic imaging of breast: Secondary | ICD-10-CM

## 2022-04-12 DIAGNOSIS — H04123 Dry eye syndrome of bilateral lacrimal glands: Secondary | ICD-10-CM | POA: Diagnosis not present

## 2022-04-12 DIAGNOSIS — H25813 Combined forms of age-related cataract, bilateral: Secondary | ICD-10-CM | POA: Diagnosis not present

## 2022-04-12 DIAGNOSIS — H02834 Dermatochalasis of left upper eyelid: Secondary | ICD-10-CM | POA: Diagnosis not present

## 2022-04-12 DIAGNOSIS — H524 Presbyopia: Secondary | ICD-10-CM | POA: Diagnosis not present

## 2022-04-12 DIAGNOSIS — H02831 Dermatochalasis of right upper eyelid: Secondary | ICD-10-CM | POA: Diagnosis not present

## 2022-04-17 ENCOUNTER — Telehealth: Payer: Self-pay | Admitting: Family Medicine

## 2022-04-17 NOTE — Telephone Encounter (Signed)
Left message for patient to call back and schedule Medicare Annual Wellness Visit (AWV) either virtually or in office. Left  my Maria Schneider number [phone]   Last AWV 03/30/21  please schedule with Nurse Health Adviser   45 min for awv-i  in office appointments 30 min for awv-s & awv-i phone/virtual appointments

## 2022-04-18 ENCOUNTER — Inpatient Hospital Stay: Admit: 2022-04-18 | Discharge: 2022-04-18 | Payer: MEDICARE | Primary: Family Medicine

## 2022-04-18 ENCOUNTER — Ambulatory Visit: Admit: 2022-04-18 | Discharge: 2022-04-18 | Payer: MEDICARE | Attending: Family Medicine | Primary: Family Medicine

## 2022-04-18 DIAGNOSIS — R7301 Impaired fasting glucose: Secondary | ICD-10-CM

## 2022-04-18 DIAGNOSIS — Z0000 Encounter for general adult medical examination without abnormal findings: Secondary | ICD-10-CM

## 2022-04-18 LAB — LIPID PANEL
Chol/HDL Ratio: 4.1 (ref 0.0–4.4)
Cholesterol: 208 mg/dL — ABNORMAL HIGH (ref 100–200)
HDL: 51 mg/dL (ref >=50)
LDL Cholesterol: 135 mg/dL — ABNORMAL HIGH (ref 0.0–100.0)
LDL/HDL Ratio: 2.7
Triglycerides: 110 mg/dL (ref 0–149)
VLDL: 22 mg/dL (ref 5.0–40.0)

## 2022-04-18 LAB — COMPREHENSIVE METABOLIC PANEL
ALT: 25 U/L (ref 0–35)
AST: 23 U/L (ref 0–35)
Albumin/Globulin Ratio: 1.57 (ref 1.00–2.70)
Albumin: 4.1 g/dL (ref 3.5–5.2)
Alk Phosphatase: 62 U/L (ref 35–117)
Anion Gap: 9 mmol/L (ref 2–17)
BUN: 10 mg/dL (ref 8–23)
CO2: 27 mmol/L (ref 22–29)
Calcium: 9.2 mg/dL (ref 8.8–10.2)
Chloride: 105 mmol/L (ref 98–107)
Creatinine: 0.8 mg/dL (ref 0.5–1.0)
Est, Glom Filt Rate: 77 mL/min/1.73m?? (ref >=60)
Globulin: 2.6 g/dL (ref 1.9–4.4)
Glucose: 96 mg/dL (ref 70–99)
Osmolaliy Calculated: 280 mosm/kg (ref 270–287)
Potassium: 4.2 mmol/L (ref 3.5–5.3)
Sodium: 141 mmol/L (ref 135–145)
Total Bilirubin: 0.53 mg/dL (ref 0.00–1.20)
Total Protein: 6.8 g/dL (ref 6.4–8.3)

## 2022-04-18 LAB — CBC WITH AUTO DIFFERENTIAL
Basophils %: 0.4 % (ref 0.0–2.0)
Basophils Absolute: 0 10*3/uL (ref 0.0–0.2)
Eosinophils %: 2.2 % (ref 0.0–7.0)
Eosinophils Absolute: 0.1 10*3/uL (ref 0.0–0.5)
Hematocrit: 43.2 % (ref 34.0–47.0)
Hemoglobin: 14.4 g/dL (ref 11.5–15.7)
Immature Grans (Abs): 0.01 10*3/uL (ref 0.00–0.06)
Immature Granulocytes %: 0.2 % (ref 0.0–0.6)
Lymphocytes Absolute: 1.6 10*3/uL (ref 1.0–3.2)
Lymphocytes: 35.8 % (ref 15.0–45.0)
MCH: 31.3 pg (ref 27.0–34.5)
MCHC: 33.3 g/dL (ref 32.0–36.0)
MCV: 93.9 fL (ref 81.0–99.0)
MPV: 9 fL (ref 7.2–13.2)
Monocytes %: 10.3 % (ref 4.0–12.0)
Monocytes Absolute: 0.5 10*3/uL (ref 0.3–1.0)
NRBC Absolute: 0 10*3/uL (ref 0.000–0.012)
NRBC Automated: 0 % (ref 0.0–0.2)
Neutrophils %: 51.1 % (ref 42.0–74.0)
Neutrophils Absolute: 2.3 10*3/uL (ref 1.6–7.3)
Platelets: 281 10*3/uL (ref 140–440)
RBC: 4.6 x10e6/mcL (ref 3.60–5.20)
RDW: 12.1 % (ref 11.0–16.0)
WBC: 4.6 10*3/uL (ref 3.8–10.6)

## 2022-04-18 LAB — HEMOGLOBIN A1C
Est. Avg. Glucose, WB: 100
Est. Avg. Glucose-calculated: 104
Hemoglobin A1C: 5.1 % (ref 4.0–6.0)

## 2022-04-18 NOTE — Patient Instructions (Signed)
Starting a Weight Loss Plan: Care Instructions  Overview     If you're thinking about losing weight, it can be hard to know where to start. Your doctor can help you set up a weight loss plan that best meets your needs. You may want to take a class on nutrition or exercise, or you could join a weight loss support group. If you have questions about how to make changes to your eating or exercise habits, ask your doctor about seeing a registered dietitian or an exercise specialist.  It can be a big challenge to lose weight. But you don't have to make huge changes at once. Make small changes, and stick with them. When those changes become habit, add a few more changes.  If you don't think you're ready to make changes right now, try to pick a date in the future. Make an appointment to see your doctor to discuss whether the time is right for you to start a plan.  Follow-up care is a key part of your treatment and safety. Be sure to make and go to all appointments, and call your doctor if you are having problems. It's also a good idea to know your test results and keep a list of the medicines you take.  How can you care for yourself at home?  Set realistic goals. Many people expect to lose much more weight than is likely. A weight loss of 5% to 10% of your body weight may be enough to improve your health.  Get family and friends involved to provide support. Talk to them about why you are trying to lose weight, and ask them to help. They can help by participating in exercise and having meals with you, even if they may be eating something different.  Find what works best for you. If you do not have time or do not like to cook, a program that offers meal replacement bars or shakes may be better for you. Or if you like to prepare meals, finding a plan that includes daily menus and recipes may be best.  Ask your doctor about other health professionals who can help you achieve your weight loss goals.  A dietitian can help  you make healthy changes in your diet.  An exercise specialist or personal trainer can help you develop a safe and effective exercise program.  A counselor or psychiatrist can help you cope with issues such as depression, anxiety, or family problems that can make it hard to focus on weight loss.  Consider joining a support group for people who are trying to lose weight. Your doctor can suggest groups in your area.  Where can you learn more?  Go to https://www.bennett.info/ and enter U357 to learn more about "Starting a Weight Loss Plan: Care Instructions."  Current as of: September 20, 2023Content Version: 13.9   2006-2023 [employer].   Care instructions adapted under license by [facility] If you have questions about a medical condition or this instruction, always ask your healthcare professional. [place] any warranty or liability for your use of this information.           A Healthy Heart: Care Instructions  Your Care Instructions     Coronary artery disease, also called heart disease, occurs when a substance called plaque builds up in the vessels that supply oxygen-rich blood to your heart muscle. This can narrow the blood vessels and reduce blood flow. A heart attack happens when blood flow is completely blocked. A  high-fat diet, smoking, and other factors increase the risk of heart disease.  Your doctor has found that you have a chance of having heart disease. You can do lots of things to keep your heart healthy. It may not be easy, but you can change your diet, exercise more, and quit smoking. These steps really work to lower your chance of heart disease.  Follow-up care is a key part of your treatment and safety. Be sure to make and go to all appointments, and call your doctor if you are having problems. It's also a good idea to know your test results and keep a list of the medicines you take.  How can you care for yourself at  home?  Diet   Use less salt when you cook and eat. This helps lower your blood pressure. Taste food before salting. Add only a little salt when you think you need it. With time, your taste buds will adjust to less salt.    Eat fewer snack items, fast foods, canned soups, and other high-salt, high-fat, processed foods.    Read food labels and try to avoid saturated and trans fats. They increase your risk of heart disease by raising cholesterol levels.    Limit the amount of solid fat-butter, margarine, and shortening-you eat. Use olive, peanut, or canola oil when you cook. Bake, broil, and steam foods instead of frying them.    Eat a variety of fruit and vegetables every day. Dark green, deep orange, red, or yellow fruits and vegetables are especially good for you. Examples include spinach, carrots, peaches, and berries.    Foods high in fiber can reduce your cholesterol and provide important vitamins and minerals. High-fiber foods include whole-grain cereals and breads, oatmeal, beans, brown rice, citrus fruits, and apples.    Eat lean proteins. Heart-healthy proteins include seafood, lean meats and poultry, eggs, beans, peas, nuts, seeds, and soy products.    Limit drinks and foods with added sugar. These include candy, desserts, and soda pop.   Lifestyle changes   If your doctor recommends it, get more exercise. Walking is a good choice. Bit by bit, increase the amount you walk every day. Try for at least 30 minutes on most days of the week. You also may want to swim, bike, or do other activities.    Do not smoke. If you need help quitting, talk to your doctor about stop-smoking programs and medicines. These can increase your chances of quitting for good. Quitting smoking may be the most important step you can take to protect your heart. It is never too late to quit.    Limit alcohol to 2 drinks a day for men and 1 drink a day for women. Too much alcohol can cause health problems.    Manage other  health problems such as diabetes, high blood pressure, and high cholesterol. If you think you may have a problem with alcohol or drug use, talk to your doctor.   Medicines   Take your medicines exactly as prescribed. Call your doctor if you think you are having a problem with your medicine.    If your doctor recommends aspirin, take the amount directed each day. Make sure you take aspirin and not another kind of pain reliever, such as acetaminophen (Tylenol).   When should you call for help?   Call 911 if you have symptoms of a heart attack. These may include:   Chest pain or pressure, or a strange feeling in the chest.  Sweating.    Shortness of breath.    Pain, pressure, or a strange feeling in the back, neck, jaw, or upper belly or in one or both shoulders or arms.    Lightheadedness or sudden weakness.    A fast or irregular heartbeat.   After you call 911, the operator may tell you to chew 1 adult-strength or 2 to 4 low-dose aspirin. Wait for an ambulance. Do not try to drive yourself.  Watch closely for changes in your health, and be sure to contact your doctor if you have any problems.  Where can you learn more?  Go to https://www.bennett.info/ and enter F075 to learn more about "A Healthy Heart: Care Instructions."  Current as of: June 25, 2023Content Version: 13.9   2006-2023 Healthwise, Incorporated.   Care instructions adapted under license by [facility]. If you have questions about a medical condition or this instruction, always ask your healthcare professional. [place] any warranty or liability for your use of this information.      Personalized Preventive Plan for KAROLE OO - 04/18/2022  Medicare offers a range of preventive health benefits. Some of the tests and screenings are paid in full while other may be subject to a deductible, co-insurance, and/or copay.    Some of these benefits include a comprehensive review of your  medical history including lifestyle, illnesses that may run in your family, and various assessments and screenings as appropriate.    After reviewing your medical record and screening and assessments performed today your provider may have ordered immunizations, labs, imaging, and/or referrals for you.  A list of these orders (if applicable) as well as your Preventive Care list are included within your After Visit Summary for your review.    Other Preventive Recommendations:    A preventive eye exam performed by an eye specialist is recommended every 1-2 years to screen for glaucoma; cataracts, macular degeneration, and other eye disorders.  A preventive dental visit is recommended every 6 months.  Try to get at least 150 minutes of exercise per week or 10,000 steps per day on a pedometer .  Order or download the FREE "Exercise & Physical Activity: Your Everyday Guide" from [employer]. Call 620-318-3521 or search [employer] online.  You need 1200-1500 mg of calcium and 1000-2000 IU of vitamin D per day. It is possible to meet your calcium requirement with diet alone, but a vitamin D supplement is usually necessary to meet this goal.  When exposed to the sun, use a sunscreen that protects against both UVA and UVB radiation with an SPF of 30 or greater. Reapply every 2 to 3 hours or after sweating, drying off with a towel, or swimming.  Always wear a seat belt when traveling in a car. Always wear a helmet when riding a bicycle or motorcycle.

## 2022-04-18 NOTE — Progress Notes (Signed)
CHIEF COMPLAINT:  Chief Complaint   Patient presents with    Medicare AWV        HISTORY OF PRESENT ILLNESS:  Ms. Lorraine Freeman is a 77 y.o. female  who presents for follow up and [place].    She tells me she is following the DASH diet now as her husband and coincidentally her were diagnosed with fatty liver.     She tells me that she had a colonoscopy just a few weeks ago (Dr. Benjie Karvonen).     For her vocal cord paralysis, she is taking benzo at nighttime and only during the day also.     She tells me she saw pulmonologist last week (Dr. Cristopher Peru).    Statin: she could not tolerate statins, nexletol, she is unsure if she needs to see him still? She says her husband is on expensive medications and this was $500. She does say there is significant heart history in her family. She reports a good calcium score done last year. This was under [place].     She wonders about rash on legs? Derm did not know what to make of it.    She does not wish to have additional COVID vaccines.    She is unsure if she still get the RSV vaccine?     PHQ:      04/18/2022     7:57 AM   PHQ-9    Little interest or pleasure in doing things 0   Feeling down, depressed, or hopeless 0   PHQ-2 Score 0   PHQ-9 Total Score 0       CURRENT MEDICATION LIST:    Current Outpatient Medications   Medication Sig Dispense Refill    ALPRAZolam (XANAX) 0.25 MG tablet Take 2 tablets by mouth 2 times daily as needed (vocal cord paralysis) for up to 90 days. Max Daily Amount: 1 mg 360 tablet 0    omeprazole (PRILOSEC) 40 MG delayed release capsule Take 1 capsule by mouth daily 90 capsule 1    clobetasol (TEMOVATE) 0.05 % ointment Apply topically 2 times daily. 60 g 3    MAGNESIUM PO Take by mouth      BIOTIN PO Take by mouth      estradiol (ESTRACE VAGINAL) 0.1 MG/GM vaginal cream Place 1 g vaginally daily Nightly x 2 weeks, every other night x 2 weeks, then as needed 42.5 g 3    albuterol sulfate HFA (PROVENTIL;VENTOLIN;PROAIR) 108 (90 Base) MCG/ACT inhaler Ventolin HFA 90  mcg/actuation aerosol inhaler      vitamin D3 (CHOLECALCIFEROL) 125 MCG (5000 UT) TABS tablet   5,000 units = tabs, Oral, Daily, # 100 tabs, 0 Refill(s)      calcium carbonate 1500 (600 Ca) MG TABS tablet Take 2 tablets by mouth daily as needed      acetaminophen (TYLENOL) 325 MG tablet 1 tablet as needed Orally every 6 hrs      vitamin B-12 (CYANOCOBALAMIN) 100 MCG tablet Vitamin B-12       No current facility-administered medications for this visit.        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) 03/22/2022    DX'D 1993    Adhesive capsulitis of shoulder 02/07/2021    Asthma 1999    Chondromalacia of patella, right 02/07/2021    Chronic back pain     Complex tear of medial meniscus of right knee, subsequent encounter 02/09/2021    Complex tear of medial meniscus, current injury, right knee, subsequent encounter 02/07/2021    Drug effect Levaquin    Tachycardia, sob    Essential hypertension 01/19/2020    Fibromyalgia     GERD (gastroesophageal reflux disease) 7 yrs. Ago    Erosive gastritis    Hepatic steatosis 04/06/2022    Per 03/30/22 CT    High cholesterol     Lumbar radiculopathy 07/26/2020    Lung cancer ([place]) 1993    Malignant neoplasm of skin 06/21/2020    Obesity     Osteoarthritis     Osteopenia     Patellofemoral syndrome of right knee 02/07/2021    Restrictive lung disease 03/22/2022    Rotator cuff tear 02/07/2021    Sciatica     Spasmodic dysphonia     left vocal cord paralysis      Past Surgical History:   Procedure Laterality Date    BREAST REDUCTION SURGERY      CHOLECYSTECTOMY      ELBOW SURGERY Right     EYE SURGERY      Cataracts, laser    FRACTURE SURGERY       Both wrists    JOINT REPLACEMENT      R shoulder, R ulna    KNEE ARTHROPLASTY      Torn meniscus    KNEE SURGERY  11/26/2018    Arthroscopy right knee with partial medial meniscectomy and chondroplasty Dr. Phillip Heal    LOBECTOMY  1980    apical resection    LUMBAR FUSION  2001    Dr. Benjaman Pott SPINE SURGERY  2003    hardware removal    SHOULDER ARTHROPLASTY Right 06/2012    Dr March Rummage    SHOULDER SURGERY Right 1996    Dr. Phillip Heal    SHOULDER SURGERY Right 2011    Dr. Phillip Heal    SKIN CANCER EXCISION      multiple    [facility] ENDOSCOPY  03-2021    Erosive gastritis    WRIST SURGERY Bilateral     ORIF, bilat wrist fxs    WRIST SURGERY  2007    both wrist    WRIST SURGERY  2005    broken wrist      Social History     Socioeconomic History    Marital status: Married     Spouse name: Not on file    Number of children: Not on file    Years of education: Not on file    Highest education level: Not on file   Occupational History    Not on file   Tobacco Use    Smoking status: Former     Current packs/day: 0.00     Average packs/day: 1 pack/day for 13.8 years (13.8 ttl pk-yrs)     Types: Cigarettes     Start date: 06/19/1963     Quit date: 03/20/1977     Years since quitting: 36.1  Smokeless tobacco: Never   Vaping Use    Vaping Use: Never used   Substance and Sexual Activity    Alcohol use: Not Currently     Alcohol/week: 10.0 standard drinks of alcohol     Types: 10 Glasses of wine per week     Comment: 2/day    Drug use: Never    Sexual activity: Not Currently     Partners: Male   Other Topics Concern    Not on file   Social History Narrative    Not on file     Social Determinants of Health     Financial Resource Strain: Not on file   Food Insecurity: Not on file   Transportation Needs: Not on file   Physical Activity: Sufficiently Active (04/18/2022)    Exercise Vital Sign     Days of Exercise per Week: 7 days     Minutes of Exercise  per Session: 40 min   Stress: Not on file   Social Connections: Not on file   Intimate Partner Violence: Not on file   Housing Stability: Not on file      Family History   Problem Relation Age of Onset    Colon Cancer Mother     Stroke Father     Abdominal aortic aneurysm Father     Diabetes Father     High Blood Pressure Father     Prostate Cancer Father     Hypertension Father     Heart Surgery Brother     Heart Surgery Brother     Arthritis Brother         Back,legs,shoulder    High Cholesterol Brother     Esophageal Cancer Brother     Cancer Brother         Tongue    Abdominal aortic aneurysm Brother     Cancer Brother         Adenoid    Cancer Son         tongue cancer    Colon Cancer Maternal Grandmother     Breast Cancer Paternal Grandmother     Breast Cancer Maternal Aunt         Breast surgery          PHYSICAL EXAM:  Physical Exam  Vitals reviewed.   Constitutional:       Appearance: Normal appearance.   HENT:      Head: Normocephalic.   Eyes:      Extraocular Movements: Extraocular movements intact.      Conjunctiva/sclera: Conjunctivae normal.   Cardiovascular:      Rate and Rhythm: Normal rate and regular rhythm.      Pulses: Normal pulses.      Heart sounds: Normal heart sounds.   Pulmonary:      Effort: Pulmonary effort is normal.      Breath sounds: Normal breath sounds.   Musculoskeletal:      Cervical back: Normal range of motion and neck supple.   Skin:     General: Skin is warm and dry.   Neurological:      General: No focal deficit present.      Mental Status: She is alert and oriented to person, place, and time. Mental status is at baseline.   Psychiatric:         Mood and Affect: Mood normal.         Behavior: Behavior normal.          Vital Signs -  Visit Vitals  BP (!) 126/52 (Site: Left Upper Arm, Position: Sitting)   Pulse 72   Temp 97.8 F (36.6 C)   Ht 1.6 m (5\' 3" )   Wt 78.6 kg (173 lb 3.2 oz)   SpO2 98%   BMI 30.68 kg/m            LABS  No results found for this visit on  04/18/22.  Hospital Outpatient Visit on 10/24/2021   Component Date Value Ref Range Status    Right arm BP 10/24/2021 127  mmHg Final    Left arm BP 10/24/2021 123  mmHg Final    Right posterior tibial 10/24/2021 156  mmHg Final    Right dorsalis pedis BP 10/24/2021 155  mmHg Final    Right ABI 10/24/2021 1.23   Final    Right toe pressure 10/24/2021 137  mmHg Final    Right TBI 10/24/2021 1.08   Final    Left posterior tibial 10/24/2021 151  mmHg Final    Left dorsalis pedis BP 10/24/2021 128  mmHg Final    Left ABI 10/24/2021 1.19   Final    Left toe pressure 10/24/2021 128  mmHg Final    Left TBI 10/24/2021 1.01   Final       IMPRESSION/PLAN    1. Medicare annual wellness visit, subsequent  2. Vaccine counseling  Comments:  RSV, TDAP, Prevnar 17 (out of stock in office)  3. Essential hypertension  Comments:  Controlled, no changes  4. Hepatic steatosis  -     Comprehensive Metabolic Panel; Future  5. Restrictive lung disease  -     CBC with Auto Differential; Future  6. Adenoid cystic carcinoma of left lung ([place])  Overview:  DX'D 1993  7. Hyperlipidemia, unspecified hyperlipidemia type  -     Lipid Panel; Future  8. Elevated fasting glucose  -     Hemoglobin A1C; Future  9. Uncomplicated asthma, unspecified asthma severity, unspecified whether persistent  10. Gastroesophageal reflux disease without esophagitis  11. Primary osteoarthritis of left hip  12. Primary osteoarthritis of right hip  13. Primary osteoarthritis of right knee  14. Osteoarthritis of shoulder, unspecified laterality, unspecified osteoarthritis type  15. Lumbosacral spondylosis without myelopathy  16. Status post shoulder joint replacement, unspecified laterality  17. History of renal calculi  18. History of malignant neoplasm of thoracic cavity structure  19. Screen for colon cancer  Comments:  Requesting note from recent colo from Dr. Posey Pronto [facility]       Follow up and Dispositions:  Return in about 6 months (around 10/17/2022) for Please get  note from Dr. Jennell Corner.       Docia Barrier, APRN - NP  Medicare Annual Wellness Visit    Lorraine Freeman is here for Medicare AWV    Assessment & Plan   Medicare annual wellness visit, subsequent  Vaccine counseling  Comments:  RSV, TDAP, Prevnar 59 (out of stock in office)  Essential hypertension  Comments:  Controlled, no changes  Hepatic steatosis  -     Comprehensive Metabolic Panel; Future  Restrictive lung disease  -     CBC with Auto Differential; Future  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)  Hyperlipidemia, unspecified hyperlipidemia type  -     Lipid Panel; Future  Elevated fasting glucose  -     Hemoglobin Q6V; Future  Uncomplicated asthma, unspecified asthma severity, unspecified whether persistent  Gastroesophageal reflux disease without esophagitis  Primary osteoarthritis of left hip  Primary osteoarthritis of right hip  Primary osteoarthritis of right knee  Osteoarthritis of shoulder, unspecified laterality, unspecified osteoarthritis type  Lumbosacral spondylosis without myelopathy  Status post shoulder joint replacement, unspecified laterality  History of renal calculi  History of malignant neoplasm of thoracic cavity structure  Screen for colon cancer  Comments:  Requesting note from recent colo from Dr. Posey Pronto GI    Recommendations for Preventive Services Due: see orders and patient instructions/AVS.  Recommended screening schedule for the next 5-10 years is provided to the patient in written form: see Patient Instructions/AVS.     Return in about 6 months (around 10/17/2022) for Please get note from Dr. Jennell Corner.     Subjective       Patient's complete Health Risk Assessment and screening values have been reviewed and are found in Flowsheets. The following problems were reviewed today and where indicated follow up appointments were made and/or referrals ordered.    Positive Risk Factor Screenings with Interventions:                Activity, Diet, and Weight:  On average, how many days per  week do you engage in moderate to strenuous exercise (like a brisk walk)?: 7 days  On average, how many minutes do you engage in exercise at this level?: 40 min    Do you eat balanced/healthy meals regularly?: Yes    Body mass index is 30.68 kg/m. (!) Abnormal    Obesity Interventions:  Patient declines any further evaluation or treatment                               Objective   Vitals:    04/18/22 0753   BP: (!) 126/52   Site: Left Upper Arm   Position: Sitting   Pulse: 72   Temp: 97.8 F (36.6 C)   SpO2: 98%   Weight: 78.6 kg (173 lb 3.2 oz)   Height: 1.6 m (5\' 3" )      Body mass index is 30.68 kg/m.               Allergies   Allergen Reactions    Levaquin [Levofloxacin] Palpitations and Other (See Comments)     Event:        Dilaudid [Hydromorphone]     Flurbiprofen      Other reaction(s): Unknown    Nsaids      Other reaction(s): stomach upset    Ezetimibe Other (See Comments)     Prior to Visit Medications    Medication Sig Taking? Authorizing Provider   ALPRAZolam Lorraine Freeman) 0.25 MG tablet Take 2 tablets by mouth 2 times daily as needed (vocal cord paralysis) for up to 90 days. Max Daily Amount: 1 mg Yes Docia Barrier, APRN - NP   omeprazole (PRILOSEC) 40 MG delayed release capsule Take 1 capsule by mouth daily Yes Reatha Armour, MD   clobetasol (TEMOVATE) 0.05 % ointment Apply topically 2 times daily. Yes Higdon, Nilda Calamity, MD   MAGNESIUM PO Take by mouth Yes [provider]   BIOTIN PO Take by mouth Yes [provider]   estradiol (ESTRACE VAGINAL) 0.1 MG/GM vaginal cream Place 1 g vaginally daily Nightly x 2 weeks, every other night x 2 weeks, then as needed Yes Higdon, Nilda Calamity, MD   albuterol sulfate HFA (PROVENTIL;VENTOLIN;PROAIR) 108 (90 Base) MCG/ACT inhaler Ventolin HFA 90 mcg/actuation aerosol inhaler Yes [provider]  vitamin D3 (CHOLECALCIFEROL) 125 MCG (5000 UT) TABS tablet   5,000 units = tabs, Oral, Daily, # 100 tabs, 0 Refill(s)  Yes [provider]   calcium carbonate 1500 (600 Ca) MG TABS tablet Take 2 tablets by mouth daily as needed Yes [provider]   acetaminophen (TYLENOL) 325 MG tablet 1 tablet as needed Orally every 6 hrs Yes [facility], [facility], MD   vitamin B-12 (CYANOCOBALAMIN) 100 MCG tablet Vitamin B-12  [provider]       CareTeam (Including outside providers/suppliers regularly involved in providing care):   Patient Care Team:  Docia Barrier, APRN - NP as PCP - General (Nurse Practitioner Family)  Dayna Ramus, Reva Bores, MD as PCP - Empaneled Provider  Margo Aye, Rosary Lively, MD as Cardiologist (Cardiology)  Lew Dawes, MD as Consulting Provider (Pulmonology)  Jamison Oka, MD as Surgeon (Gastroenterology)     Reviewed and updated this visit:  Tobacco  Allergies  Meds  Med Hx  Surg Hx  Soc Hx  Fam Hx

## 2022-04-18 NOTE — Other (Signed)
Results noted. Message sent to patient via [place].

## 2022-04-20 ENCOUNTER — Ambulatory Visit: Payer: Medicare PPO | Attending: Internal Medicine | Admitting: Internal Medicine

## 2022-04-20 VITALS — BP 121/79 | HR 73 | Ht 65.0 in | Wt 162.4 lb

## 2022-04-20 DIAGNOSIS — R002 Palpitations: Secondary | ICD-10-CM

## 2022-04-20 DIAGNOSIS — R079 Chest pain, unspecified: Secondary | ICD-10-CM | POA: Diagnosis not present

## 2022-04-20 DIAGNOSIS — E785 Hyperlipidemia, unspecified: Secondary | ICD-10-CM | POA: Diagnosis not present

## 2022-04-20 DIAGNOSIS — R61 Generalized hyperhidrosis: Secondary | ICD-10-CM

## 2022-04-20 DIAGNOSIS — I2510 Atherosclerotic heart disease of native coronary artery without angina pectoris: Secondary | ICD-10-CM | POA: Diagnosis not present

## 2022-04-20 DIAGNOSIS — R0609 Other forms of dyspnea: Secondary | ICD-10-CM

## 2022-04-20 DIAGNOSIS — I10 Essential (primary) hypertension: Secondary | ICD-10-CM | POA: Diagnosis not present

## 2022-04-20 NOTE — Patient Instructions (Signed)
Medication Instructions:  No Changes In Medications at this time.  *If you need a refill on your cardiac medications before your next appointment, please call your pharmacy*  Follow-Up: At [facility], you and your health needs are our priority.  As part of our continuing mission to provide you with exceptional heart care, we have created designated Provider Care Teams.  These Care Teams include your primary Cardiologist (physician) and Advanced Practice Providers (APPs -  Physician Assistants and Nurse Practitioners) who all work together to provide you with the care you need, when you need it.  Your next appointment:   1 year(s)  Provider:   Gayatri A Acharya, MD    

## 2022-04-20 NOTE — Progress Notes (Signed)
Cardiology Office Note:    Date:  04/20/2022   ID:  Maria Schneider, DOB 28-Mar-1945, MRN [id number]  PCP:  Laurey Morale, MD  Cardiologist:  Elouise Munroe, MD  Electrophysiologist:  None   Referring MD: Laurey Morale, MD   Chief Complaint/Reason for Visit: Moderate CAD  History of Present Illness:    Maria Schneider is a 77 y.o. female with a history of asthma, depression, GERD, HTN, HLD, who presents today for follow up of chest pressure with exertion and DOE and review of cardiovascular risk factor modification.   Last visit she had been having diaphoresis on the back of her neck for three months and a feeling of overall warmth while completing any housework. She felt generally fatigued and easily short of breath when climbing up stairs. No significant chest pressure, some dyspnea on exertion.    Today, the patient states that she has been doing ok recently. Her sister passed away recently, I expressed my condolences.  She had a mild case of COVID last summer and has been feeling off since. She has been having a similar virus of unknown etiology for the past few weeks after travelling which has caused her to lose her voice and have some weakness. It has improved since onset and her only remaining symptom is loss of voice.  She has been compliant with Repatha which has well controlled her LDL.   She was concerned with moderate CAD - we reviewed CCTA in detail again. Reviewed medical management of CAD per guidelines and literature.  She denies any palpitations, chest pain, shortness of breath, or peripheral edema. No lightheadedness, headaches, syncope, orthopnea, or PND.   Past Medical History:  Diagnosis Date   Allergy    Arthritis    Asthma    Cancer ([place])    hx skin cancer   Depression    Detrusor instability    Diverticulosis    not symptomatic   GERD (gastroesophageal reflux disease)    History of kidney stones    History of pancreatitis    April 2015    History of skin cancer    Hyperlipidemia    Hypertension    Osteopenia    Urinary incontinence    Vitamin D deficiency    RESOLVED     Past Surgical History:  Procedure Laterality Date   ABDOMINAL HYSTERECTOMY  03/20/1992   TAH/ BLADDER SUSPENSION   BREAST BIOPSY  03/20/1970   benign   CHOLECYSTECTOMY N/A 07/12/2013   Procedure: LAPAROSCOPIC CHOLECYSTECTOMY WITH INTRAOPERATIVE CHOLANGIOGRAM;  Surgeon: Edward Jolly, MD;  Location: [facility];  Service: General;  Laterality: N/A;   COLONOSCOPY  06/24/2012   2022   ERCP N/A 07/14/2013   Procedure: ENDOSCOPIC RETROGRADE CHOLANGIOPANCREATOGRAPHY (ERCP);  Surgeon: Gatha Mayer, MD;  Location: [facility];  Service: Endoscopy;  Laterality: N/A;   TONSILLECTOMY     TOTAL HIP ARTHROPLASTY Right 01/13/2015   Procedure: RIGHT TOTAL HIP ARTHROPLASTY ANTERIOR APPROACH;  Surgeon: Gaynelle Arabian, MD;  Location: [facility];  Service: Orthopedics;  Laterality: Right;   TOTAL HIP ARTHROPLASTY Left 11/07/2017   Procedure: LEFT TOTAL HIP ARTHROPLASTY ANTERIOR APPROACH;  Surgeon: Gaynelle Arabian, MD;  Location: [facility];  Service: Orthopedics;  Laterality: Left;   TUBAL LIGATION      Current Medications: Current Meds  Medication Sig   aspirin EC 81 MG tablet Take 1 tablet (81 mg total) by mouth daily. Swallow whole.   buPROPion (WELLBUTRIN XL) 300 MG 24 hr tablet TAKE  1 TABLET BY MOUTH EVERY DAY   Cholecalciferol (VITAMIN D3) 25 MCG (1000 UT) CAPS Take 5,000 Units by mouth daily.    Evolocumab (REPATHA SURECLICK) [id number] MG/ML SOAJ Inject 140 mg into the skin every 14 (fourteen) days.   ezetimibe (ZETIA) 10 MG tablet TAKE 1 TABLET BY MOUTH EVERY DAY   famotidine (PEPCID) 20 MG tablet Take 1 tablet (20 mg total) by mouth daily. (Patient taking differently: Take 40 mg by mouth daily. Taking 40 mg)   fenofibrate 160 MG tablet TAKE 1 TABLET BY MOUTH EVERY DAY   metoprolol succinate (TOPROL-XL) 25 MG 24 hr tablet TAKE 1 TABLET (25 MG TOTAL) BY MOUTH DAILY.    MYRBETRIQ 50 MG TB24 tablet Take 50 mg by mouth daily.   sertraline (ZOLOFT) 100 MG tablet Take 2 tablets (200 mg total) by mouth daily.   valsartan (DIOVAN) 80 MG tablet TAKE 1 TABLET BY MOUTH EVERY DAY     Allergies:   Ace inhibitors, Codeine, Fentanyl, and Statins   Social History   Tobacco Use   Smoking status: Former    Years: 42.00    Types: Cigarettes    Quit date: 07/09/2008    Years since quitting: 13.8   Smokeless tobacco: Never   Tobacco comments:    9 years ago  Vaping Use   Vaping Use: Never used  Substance Use Topics   Alcohol use: Yes    Alcohol/week: 2.0 standard drinks of alcohol    Types: 2 Glasses of wine per week    Comment: OCC   Drug use: No     Family History: The patient's family history includes Breast cancer in her paternal aunt; Diabetes in her paternal uncle; Heart disease in her father; Hypertension in her mother; Osteoporosis in her mother and sister.  ROS:   Please see the history of present illness.    (+) Vocal weakness with viral infection All other systems reviewed and are negative.  EKGs/Labs/Other Studies Reviewed:    The following studies were reviewed today:  EKG:  The EKG is personally reviewed 04/20/2022: NSR, low voltage 11/16/20: NSR, low voltage QRS 03/03/20: Sinus bradycardia rate 54  Recent Labs: 09/08/2021: ALT 16; BUN 23; Creatinine, Ser 1.27; Hemoglobin 11.9; Platelets 179.0; Potassium 4.0; Sodium 139; TSH 3.06  Recent Lipid Panel    Component Value Date/Time   CHOL 149 09/08/2021 0955   CHOL 129 11/17/2020 0826   TRIG 128.0 09/08/2021 0955   HDL 84.50 09/08/2021 0955   HDL 71 11/17/2020 0826   CHOLHDL 2 09/08/2021 0955   VLDL 25.6 09/08/2021 0955   LDLCALC 39 09/08/2021 0955   LDLCALC 37 11/17/2020 0826   LDLDIRECT 147.7 12/29/2011 0957    Physical Exam:    VS:  BP 121/79   Pulse 73   Ht '5\' 5"'$  (1.651 m)   Wt 162 lb 6.4 oz (73.7 kg)   SpO2 98%   BMI 27.02 kg/m     Wt Readings from Last 5 Encounters:   04/20/22 162 lb 6.4 oz (73.7 kg)  04/05/22 161 lb (73 kg)  03/06/22 164 lb 6.4 oz (74.6 kg)  01/16/22 161 lb (73 kg)  09/08/21 162 lb 8 oz (73.7 kg)    Constitutional: No acute distress Eyes: sclera non-icteric, normal conjunctiva and lids ENMT: normal dentition, moist mucous membranes Cardiovascular: regular rhythm, normal rate, no murmurs. S1 and S2 normal. Radial pulses normal bilaterally. No jugular venous distention.  Respiratory: clear to auscultation bilaterally GI : normal bowel sounds, soft and  nontender. No distention.   MSK: extremities warm, well perfused. No edema.  NEURO: grossly nonfocal exam, moves all extremities. PSYCH: alert and oriented x 3, normal mood and affect.    ASSESSMENT:    1. Coronary artery disease involving native coronary artery of native heart without angina pectoris   2. Diaphoresis   3. DOE (dyspnea on exertion)   4. Chest pain, unspecified type   5. Essential hypertension   6. Palpitations   7. Hyperlipidemia, unspecified hyperlipidemia type      PLAN:    HLD - continue Repatha, Zetia, fibrate. Lipids very well controlled. LDL 39 (08/2021)    Dyspnea on exertion Palpitations Diaphoresis CAD  - moderate CAD by coronary CTA last year. Continue repatha. ASA 81 mg daily. Discussed anatomy in detail today. - sx have otherwise resolved. Observe.  HTN - Valsartan 80 mg daily. BP well controlled.   Total time of encounter: 30 minutes total time of encounter, including 25 minutes spent in face-to-face patient care on the date of this encounter. This time includes coordination of care and counseling regarding above mentioned problem list. Remainder of non-face-to-face time involved reviewing chart documents/testing relevant to the patient encounter and documentation in the medical record. I have independently reviewed documentation from referring provider.   Cherlynn Kaiser, MD, [place]   Shared Decision  Making/Informed Consent:   Medication Adjustments/Labs and Tests Ordered: Current medicines are reviewed at length with the patient today.  Concerns regarding medicines are outlined above.   Orders Placed This Encounter  Procedures   EKG 12-Lead   No orders of the defined types were placed in this encounter.  Patient Instructions  Medication Instructions:  No Changes In Medications at this time.  *If you need a refill on your cardiac medications before your next appointment, please call your pharmacy*  Follow-Up: At [facility], you and your health needs are our priority.  As part of our continuing mission to provide you with exceptional heart care, we have created designated Provider Care Teams.  These Care Teams include your primary Cardiologist (physician) and Advanced Practice Providers (APPs -  Physician Assistants and Nurse Practitioners) who all work together to provide you with the care you need, when you need it.  Your next appointment:   1 year(s)  Provider:   Elouise Munroe, MD      I,Coren O'Brien,acting as a scribe for Elouise Munroe, MD.,have documented all relevant documentation on the behalf of Elouise Munroe, MD,as directed by  Elouise Munroe, MD while in the presence of Elouise Munroe, MD.  I, Elouise Munroe, MD, have reviewed all documentation for the visit on 04/20/2022. The documentation on today's date of service for the exam, diagnosis, procedures, and orders are all accurate and complete.

## 2022-04-27 ENCOUNTER — Encounter: Payer: Self-pay | Admitting: Family Medicine

## 2022-04-27 ENCOUNTER — Ambulatory Visit
Admission: RE | Admit: 2022-04-27 | Discharge: 2022-04-27 | Disposition: A | Discharge status: Home / Self Care | Payer: Medicare PPO | Source: Ambulatory Visit | Attending: Neurology | Admitting: Neurology

## 2022-04-27 DIAGNOSIS — R413 Other amnesia: Secondary | ICD-10-CM | POA: Diagnosis not present

## 2022-04-27 DIAGNOSIS — I6782 Cerebral ischemia: Secondary | ICD-10-CM | POA: Diagnosis not present

## 2022-04-27 NOTE — Telephone Encounter (Signed)
I would give this some more time for her vocal cords to heal. Let me know if the hoarseness persists after 2 more weeks

## 2022-05-01 ENCOUNTER — Telehealth: Payer: Self-pay | Admitting: Anesthesiology

## 2022-05-01 NOTE — Progress Notes (Signed)
Patient advised of MRI results.

## 2022-05-01 NOTE — Telephone Encounter (Signed)
Pt returned call about MRI results.

## 2022-05-01 NOTE — Progress Notes (Signed)
LMOVm to call the office back.

## 2022-05-03 ENCOUNTER — Encounter: Attending: Cardiovascular Disease | Primary: Family Medicine

## 2022-05-04 ENCOUNTER — Telehealth (INDEPENDENT_AMBULATORY_CARE_PROVIDER_SITE_OTHER): Payer: Medicare PPO | Admitting: Family Medicine

## 2022-05-04 DIAGNOSIS — Z0000 Encounter for general adult medical examination without abnormal findings: Secondary | ICD-10-CM

## 2022-05-04 NOTE — Patient Instructions (Addendum)
I really enjoyed getting to talk with you today! I am available on Tuesdays and Thursdays for virtual visits if you have any questions or concerns, or if I can be of any further assistance.   CHECKLIST FROM ANNUAL WELLNESS VISIT:  -Follow up (please call to schedule if not scheduled after visit):   -yearly for annual wellness visit with primary care office  Here is a list of your preventive care/health maintenance measures and the plan for each if any are due:  Health Maintenance  Topic Date Due   Pneumonia Vaccine 35+ Years old (3 of 3 - PPSV23 or PCV20) 02/23/2021   Zoster Vaccines- Shingrix (2 of 2) 11/11/2021   COVID-19 Vaccine (7 - 2023-24 season) 12/19/2022 (Originally 03/09/2022)   MAMMOGRAM  11/16/2022   Medicare Annual Wellness (AWV)  05/05/2023   DTaP/Tdap/Td (2 - Td or Tdap) 01/04/2028   COLONOSCOPY (Pts 45-22yr Insurance coverage will need to be confirmed)  01/26/2030   INFLUENZA VACCINE  Completed   DEXA SCAN  Completed   Hepatitis C Screening  Completed   HPV VACCINES  Aged Out   PAP SMEAR-Modifier  Discontinued    -See a dentist at least yearly  -Get your eyes checked and then per your eye specialist's recommendations  -Other issues addressed today: -see balance exercises below - do daily -after 2 weeks of balance exercises add walking in place for 5 minutes while holding on, slowly increase -contact your neurologist and see if you can start physical therapy now -consider silver sneakers  -I have included below further information regarding a healthy whole foods based diet, physical activity guidelines for adults, stress management and opportunities for social connections. I hope you find this information useful.    -----------------------------------------------------------------------------------------------------------------------------------------------------------------------------------------------------------------------------------------------------------  NUTRITION: -eat real food: lots of colorful vegetables (half the plate) and fruits -5-7 servings of vegetables and fruits per day (fresh or steamed is best), exp. 2 servings of vegetables with lunch and dinner and 2 servings of fruit per day. Berries and greens such as kale and collards are great choices.  -consume on a regular basis: whole grains (make sure first ingredient on label contains the word "whole"), fresh fruits, fish, nuts, seeds, healthy oils (such as olive oil, avocado oil, grape seed oil) -may eat small amounts of dairy and lean meat on occasion, but avoid processed meats such as ham, bacon, lunch meat, etc. -drink water -try to avoid fast food and pre-packaged foods, processed meat -most experts advise limiting sodium to < 23049mper day, should limit further is any chronic conditions such as high blood pressure, heart disease, diabetes, etc. The American Heart Association advised that < 150024ms is ideal -try to avoid foods that contain any ingredients with names you do not recognize  -try to avoid sugar/sweets (except for the natural sugar that occurs in fresh fruit) -try to avoid sweet drinks -try to avoid white rice, white bread, pasta (unless whole grain), white or yellow potatoes  EXERCISE GUIDELINES FOR ADULTS: -if you wish to increase your physical activity, do so gradually and with the approval of your doctor -STOP and seek medical care immediately if you have any chest pain, chest discomfort or trouble breathing when starting or increasing exercise  -move and stretch your body, legs, feet and arms when sitting for long periods -Physical activity guidelines for optimal health in adults: -least 150 minutes per week of  aerobic exercise (can talk, but not sing) once approved by your doctor, 20-30 minutes of sustained activity or two  10 minute episodes of sustained activity every day.  -resistance training at least 2 days per week if approved by your doctor -balance exercises 3+ days per week:   Stand somewhere where you have something sturdy to hold onto if you lose balance.    1) lift up on toes, start with 5x per day and work up to 20x   2) stand and lift on leg straight out to the side so that foot is a few inches of the floor, start with 5x each side and work up to 20x each side   3) stand on one foot, start with 5 seconds each side and work up to 20 seconds on each side  If you need ideas or help with getting more active:  -Silver sneakers https://tools.silversneakers.com  -Walk with a Doc: http://stephens-thompson.biz/  -try to include resistance (weight lifting/strength building) and balance exercises twice per week: or the following link for ideas: ChessContest.fr  UpdateClothing.com.cy  STRESS MANAGEMENT: -can try meditating, or just sitting quietly with deep breathing while intentionally relaxing all parts of your body for 5 minutes daily -if you need further help with stress, anxiety or depression please follow up with your primary doctor or contact the wonderful folks at [place]: [place]: -options in [place] if you wish to engage in more social and exercise related activities:  -Silver sneakers https://tools.silversneakers.com  -Walk with a Doc: http://stephens-thompson.biz/  -Check out the [place] 50+ section on the [employer] (hiking clubs, book clubs, cards and games, chess, exercise classes, aquatic classes and much more) - see the website for  details: https://www.[place].gov/departments/parks-recreation/active-adults50  -YouTube has lots of exercise videos for different ages and abilities as well  -[place] (a variety of indoor and outdoor inperson activities for adults). [phone]. [address]  -Virtual Online Classes (a variety of topics): see seniorplanet.org or call (734)113-4448  -consider volunteering at a school, hospice center, church, senior center or elsewhere

## 2022-05-04 NOTE — Progress Notes (Signed)
PATIENT CHECK-IN and HEALTH RISK ASSESSMENT QUESTIONNAIRE:  -completed by phone/video for upcoming Medicare Preventive Visit  Pre-Visit Check-in: 1)Vitals (height, wt, BP, etc) - record in vitals section for visit on day of visit 2)Review and Update Medications, Allergies PMH, Surgeries, Social history in Epic 3)Hospitalizations in the last year with date/reason?  No 4)Review and Update Care Team (patient's specialists) in Epic 5) Complete PHQ9 in Epic  6) Complete Fall Screening in Epic 7)Review all Health Maintenance Due and order under PCP if not done.  8)Medicare Wellness Questionnaire: Answer theses question about your habits: Do you drink alcohol? Yes, wine  If yes, how many drinks do you have a day?Social drinker Have you ever smoked? Yes  Quit date if applicable?  13 1/2 How many packs a day do/did you smoke? 1/2 pack per day Do you use smokeless tobacco?No Do you use an illicit drugs? No Do you exercises? No IF so, what type and how many days/minutes per week?patient will go for a walk sometimes - but she is nervous as has had some fall Are you sexually active? No Number of partners?n/a Feels like she eats fairly healthy - but eats too much sugar Typical breakfast: oatmeal, toast, cereal Typical lunch:sandwich, chips, cookie Typical dinner: veg, salad, protein Typical snacks:candy,cookies  Beverages: sweet tea, coffee  Answer theses question about you: Can you perform most household chores?Yes, most of them Do you find it hard to follow a conversation in a noisy room?No, just go hearing aids in both ears Do you often ask people to speak up or repeat themselves? Yes, if don't have hearing aid in Do you feel that you have a problem with memory? Yes, patient feels she has a short term memory problem Do you balance your checkbook and or bank acounts?No, checks online Do you feel safe at home?Yes Last dentist visit?4 months ago, appt scheduled 05/14/22 Do you need assistance  with any of the following: Please note if so No, assistance need  Driving?  Feeding yourself?  Getting from bed to chair?  Getting to the toilet?  Bathing or showering?  Dressing yourself?  Managing money?  Climbing a flight of stairs  Preparing meals?  Do you have Advanced Directives in place (Living Will, Healthcare Power or Attorney)? Yes   Last eye Exam and location?04/12/22-at [facility] in [place]   Do you currently use prescribed or non-prescribed narcotic or opioid pain medications?Extra strength Tylenol  Do you have a history or close family history of breast, ovarian, tubal or peritoneal cancer or a family member with BRCA (breast cancer susceptibility 1 and 2) gene mutations?  Nurse/Assistant Credentials/time stamp:  St   ----------------------------------------------------------------------------------------------------------------------------------------------------------------------------------------------------------------------   MEDICARE ANNUAL PREVENTIVE VISIT WITH PROVIDER: (Welcome to [employer], initial annual wellness or annual wellness exam)  Virtual Visit via Video Note  I connected with Jeanie on 05/04/22 by a video enabled telemedicine application and verified that I am speaking with the correct person using two identifiers.  Location patient: home Location provider:work or home office Persons participating in the virtual visit: patient, provider  Concerns and/or follow up today: she is recovering from a [place] - improving.No concerns. Stable otherwise.  She has issues with balance - seeing neurology and she reports no serious pathology. Reports he plans to send her for PT. She has had a few falls and is now nervous to exercise or walk for fear of falling. She gave up sugar for lent and now is noticing that sugar is in EVERYthing!   See HM  section in Epic for other details of completed HM.    ROS: negative for report of fevers,  unintentional weight loss, vision changes, vision loss, hearing loss or change, chest pain, sob, hemoptysis, melena, hematochezia, hematuria, genital discharge or lesions, falls, bleeding or bruising, loc, thoughts of suicide or self harm, memory loss  Patient-completed extensive health risk assessment - reviewed and discussed with the patient: See Health Risk Assessment completed with patient prior to the visit either above or in recent phone note. This was reviewed in detailed with the patient today and appropriate recommendations, orders and referrals were placed as needed per Summary below and patient instructions.   Review of Medical History: -PMH, PSH, Family History and current specialty and care providers reviewed and updated and listed below   Patient Care Team: Laurey Morale, MD as PCP - General Elouise Munroe, MD as PCP - Cardiology (Cardiology) Pieter Partridge, DO as Consulting Physician (Neurology)   Past Medical History:  Diagnosis Date   Allergy    Arthritis    Asthma    Cancer [facility])    hx skin cancer   Depression    Detrusor instability    Diverticulosis    not symptomatic   GERD (gastroesophageal reflux disease)    History of kidney stones    History of pancreatitis    April 2015   History of skin cancer    Hyperlipidemia    Hypertension    Osteopenia    Urinary incontinence    Vitamin D deficiency    RESOLVED     Past Surgical History:  Procedure Laterality Date   ABDOMINAL HYSTERECTOMY  03/20/1992   TAH/ BLADDER SUSPENSION   BREAST BIOPSY  03/20/1970   benign   CHOLECYSTECTOMY N/A 07/12/2013   Procedure: LAPAROSCOPIC CHOLECYSTECTOMY WITH INTRAOPERATIVE CHOLANGIOGRAM;  Surgeon: Edward Jolly, MD;  Location: [facility];  Service: General;  Laterality: N/A;   COLONOSCOPY  06/24/2012   2022   ERCP N/A 07/14/2013   Procedure: ENDOSCOPIC RETROGRADE CHOLANGIOPANCREATOGRAPHY (ERCP);  Surgeon: Gatha Mayer, MD;  Location: [facility];  Service:  Endoscopy;  Laterality: N/A;   TONSILLECTOMY     TOTAL HIP ARTHROPLASTY Right 01/13/2015   Procedure: RIGHT TOTAL HIP ARTHROPLASTY ANTERIOR APPROACH;  Surgeon: Gaynelle Arabian, MD;  Location: [facility];  Service: Orthopedics;  Laterality: Right;   TOTAL HIP ARTHROPLASTY Left 11/07/2017   Procedure: LEFT TOTAL HIP ARTHROPLASTY ANTERIOR APPROACH;  Surgeon: Gaynelle Arabian, MD;  Location: [facility];  Service: Orthopedics;  Laterality: Left;   TUBAL LIGATION      Social History   Socioeconomic History   Marital status: Married    Spouse name: Not on file   Number of children: 1   Years of education: 16   Highest education level: Not on file  Occupational History   Occupation: retired  Tobacco Use   Smoking status: Former    Years: 42.00    Types: Cigarettes    Quit date: 07/09/2008    Years since quitting: 13.8   Smokeless tobacco: Never   Tobacco comments:    9 years ago  Vaping Use   Vaping Use: Never used  Substance and Sexual Activity   Alcohol use: Yes    Alcohol/week: 2.0 standard drinks of alcohol    Types: 2 Glasses of wine per week    Comment: OCC   Drug use: No   Sexual activity: Never    Birth control/protection: Surgical  Other Topics Concern   Not on file  Social History Narrative   Gets regular exercise   Right handed   Two story home   No caffeine      Social Determinants of Health   Financial Resource Strain: Low Risk  (03/30/2021)   Overall Financial Resource Strain (CARDIA)    Difficulty of Paying Living Expenses: Not hard at all  Food Insecurity: No Food Insecurity (03/30/2021)   Hunger Vital Sign    Worried About Running Out of Food in the Last Year: Never true    Ran Out of Food in the Last Year: Never true  Transportation Needs: No Transportation Needs (03/30/2021)   PRAPARE - Hydrologist (Medical): No    Lack of Transportation (Non-Medical): No  Physical Activity: Insufficiently Active (03/30/2021)   Exercise Vital Sign     Days of Exercise per Week: 3 days    Minutes of Exercise per Session: 20 min  Stress: No Stress Concern Present (03/30/2021)   [place]    Feeling of Stress : Not at all  Social Connections: [place] (03/30/2021)   Social Connection and Isolation Panel [NHANES]    Frequency of Communication with Friends and Family: More than three times a week    Frequency of Social Gatherings with Friends and Family: More than three times a week    Attends Religious Services: More than 4 times per year    Active Member of [employer] or Organizations: Yes    Attends Music therapist: More than 4 times per year    Marital Status: Married  Human resources officer Violence: Not At Risk (03/30/2021)   Humiliation, Afraid, Rape, and Kick questionnaire    Fear of Current or Ex-Partner: No    Emotionally Abused: No    Physically Abused: No    Sexually Abused: No    Family History  Problem Relation Age of Onset   Hypertension Mother    Osteoporosis Mother    Heart disease Father    Osteoporosis Sister    Breast cancer Paternal Aunt        Age 72's   Diabetes Paternal Uncle     Current Outpatient Medications on File Prior to Visit  Medication Sig Dispense Refill   aspirin EC 81 MG tablet Take 1 tablet (81 mg total) by mouth daily. Swallow whole. 90 tablet 3   buPROPion (WELLBUTRIN XL) 300 MG 24 hr tablet TAKE 1 TABLET BY MOUTH EVERY DAY 90 tablet 3   Cholecalciferol (VITAMIN D3) 25 MCG (1000 UT) CAPS Take 5,000 Units by mouth daily.      Evolocumab (REPATHA SURECLICK) [id number] MG/ML SOAJ Inject 140 mg into the skin every 14 (fourteen) days. 2 mL 11   ezetimibe (ZETIA) 10 MG tablet TAKE 1 TABLET BY MOUTH EVERY DAY 90 tablet 0   famotidine (PEPCID) 20 MG tablet Take 1 tablet (20 mg total) by mouth daily. (Patient taking differently: Take 40 mg by mouth daily. Taking 40 mg) 1 tablet 0   fenofibrate 160 MG tablet TAKE 1 TABLET BY  MOUTH EVERY DAY 90 tablet 0   methylPREDNISolone (MEDROL DOSEPAK) 4 MG TBPK tablet As directed (Patient not taking: Reported on 04/20/2022) 21 tablet 0   metoprolol succinate (TOPROL-XL) 25 MG 24 hr tablet TAKE 1 TABLET (25 MG TOTAL) BY MOUTH DAILY. 90 tablet 0   MYRBETRIQ 50 MG TB24 tablet Take 50 mg by mouth daily.  11   sertraline (ZOLOFT) 100 MG tablet Take 2 tablets (  200 mg total) by mouth daily. 90 tablet 3   valsartan (DIOVAN) 80 MG tablet TAKE 1 TABLET BY MOUTH EVERY DAY 90 tablet 0   No current facility-administered medications on file prior to visit.    Allergies  Allergen Reactions   Ace Inhibitors     REACTION: cough   Codeine Nausea And Vomiting   Fentanyl Itching   Statins     REACTION: MYALGIAS-muscle pain       Physical Exam There were no vitals filed for this visit. Estimated body mass index is 27.02 kg/m as calculated from the following:   Height as of 04/20/22: 5' 5"$  (1.651 m).   Weight as of 04/20/22: 162 lb 6.4 oz (73.7 kg).  EKG (optional): deferred due to virtual visit  GENERAL: alert, oriented, no acute distress detected, full vision exam deferred due to pandemic and/or virtual encounter   HEENT: atraumatic, conjunttiva clear, no obvious abnormalities on inspection of external nose and ears  NECK: normal movements of the head and neck  LUNGS: on inspection no signs of respiratory distress, breathing rate appears normal, no obvious gross SOB, gasping or wheezing  CV: no obvious cyanosis  MS: moves all visible extremities without noticeable abnormality  PSYCH/NEURO: pleasant and cooperative, no obvious depression or anxiety, speech and thought processing grossly intact, Cognitive function grossly intact  Flowsheet Row Video Visit from 05/04/2022 in [facility] [place]  PHQ-9 Total Score 6           05/04/2022   11:40 AM 04/05/2022    9:36 AM 01/16/2022   10:10 AM 09/08/2021    9:03 AM 03/30/2021   10:44 AM  Depression  screen PHQ 2/9  Decreased Interest 2 2 1 $ 0 0  Down, Depressed, Hopeless 2 0 0 0 0  PHQ - 2 Score 4 2 1 $ 0 0  Altered sleeping 0 0 0 0   Tired, decreased energy 2 2 3 1   $ Change in appetite 0 0 0 1   Feeling bad or failure about yourself  0 1 0 0   Trouble concentrating 0 0 0 0   Moving slowly or fidgety/restless 0 0 1 0   Suicidal thoughts 0 0 0 0   PHQ-9 Score 6 5 5 2   $ Difficult doing work/chores Not difficult at all Somewhat difficult Somewhat difficult Somewhat difficult        03/30/2021   10:46 AM 09/08/2021    9:03 AM 01/16/2022   10:10 AM 03/06/2022   12:41 PM 05/04/2022   11:40 AM  Fall Risk  Falls in the past year? 0 1 1 1 1  $ Was there an injury with Fall? 0 1 1 0 1  Was there an injury with Fall? - Comments     bruises fell down steps  Fall Risk Category Calculator 0 2 3 2 2  $ Fall Risk Category (Retired) Low Moderate High Moderate   (RETIRED) Patient Fall Risk Level Low fall risk Low fall risk Low fall risk Moderate fall risk   Patient at Risk for Falls Due to  No Fall Risks No Fall Risks    Fall risk Follow up  Falls evaluation completed Falls evaluation completed       SUMMARY AND PLAN:  Encounter for Medicare annual wellness exam     Discussed applicable health maintenance/preventive health measures and advised and referred or ordered per patient preferences:  Health Maintenance  Topic Date Due   Pneumonia Vaccine 2+ Years old (3 of  3 - PPSV23 or PCV20) 02/23/2021 - she is considering and plans to get at the pharmacy or with Dr. Sarajane Jews.   MAMMOGRAM  10/28/2021, reports she had this and gives me the date - reports done at [facility] 11[facility]. I can not find this report in Epic, asked nurse assistant to request report to abstract.   Zoster Vaccines- Shingrix (2 of 2) 11/11/2021, she is considering and plans to get at the pharmacy   COVID-19 Vaccine (7 - 2023-24 season) She had latest booster in October.    Medicare Annual Wellness (AWV)  03/30/2022   DTaP/Tdap/Td (2  - Td or Tdap) 01/04/2028   COLONOSCOPY (Pts 45-40yr Insurance coverage will need to be confirmed)  01/26/2030   INFLUENZA VACCINE  Completed   DEXA SCAN  Completed   Hepatitis C Screening  Completed   HPV VACCINES  Aged Out   PAP SMEAR-Modifier  Discontinued     Education and counseling on the following was provided based on the above review of health and a plan/checklist for the patient, along with additional information discussed, was provided for the patient in the patient instructions :   -Provided counseling and plan for increased risk of falling if applicable per above screening. Asked her to contact her neurologist whom she says was going to refer for PT to see if she can start that asap. Also, discussed and demonstrated safe balance exercises she can start doing each day at home and discussed ways to safely initiated exercise. Also discussed precautions - ensuring nothing she can trip over, caution on steps, using cane walker when needed, getting bearings before starting out.  -Advised and counseled on maintaining healthy weight and healthy lifestyle - including the importance of a health diet, regular physical activity, social connections and stress management. -Advised and counseled on a heart healthy whole foods based diet at length. A summary of a healthy diet was provided in the Patient Instructions.  -Recommended regular exercise and discussed options within the community. Goal to start the balance exercises (see above) daily followed by walking in place while holding on to walker or sturdy surface for 5 minutes, gradually increasing by a few minutes each week until she can walk 15 minutes comfortable. Once balance is improved she can start walking on flat surfaces again - she hopes to eventually be able to walk again as she loves it - I think she will be able too with this approach and will feel stronger and more able. She will start with cane walker. Also discussed silver sneakers  and water aerobics she is considering as her husband does this.  -Advised yearly dental visits at minimum and regular eye exams   Follow up: see patient instructions     Patient Instructions  I really enjoyed getting to talk with you today! I am available on Tuesdays and Thursdays for virtual visits if you have any questions or concerns, or if I can be of any further assistance.   CHECKLIST FROM ANNUAL WELLNESS VISIT:  -Follow up (please call to schedule if not scheduled after visit):   -yearly for annual wellness visit with primary care office  Here is a list of your preventive care/health maintenance measures and the plan for each if any are due:  Health Maintenance  Topic Date Due   Pneumonia Vaccine 75 Years old (3 of 3 - PPSV23 or PCV20) 02/23/2021   Zoster Vaccines- Shingrix (2 of 2) 11/11/2021   COVID-19 Vaccine (7 - 2023-24 season) 12/19/2022 (Originally 03/09/2022)  MAMMOGRAM  11/16/2022   Medicare Annual Wellness (AWV)  05/05/2023   DTaP/Tdap/Td (2 - Td or Tdap) 01/04/2028   COLONOSCOPY (Pts 45-38yr Insurance coverage will need to be confirmed)  01/26/2030   INFLUENZA VACCINE  Completed   DEXA SCAN  Completed   Hepatitis C Screening  Completed   HPV VACCINES  Aged Out   PAP SMEAR-Modifier  Discontinued    -See a dentist at least yearly  -Get your eyes checked and then per your eye specialist's recommendations  -Other issues addressed today: -see balance exercises below - do daily -after 2 weeks of balance exercises add walking in place for 5 minutes while holding on, slowly increase -contact your neurologist and see if you can start physical therapy now -consider silver sneakers  -I have included below further information regarding a healthy whole foods based diet, physical activity guidelines for adults, stress management and opportunities for social connections. I hope you find this information useful.    -----------------------------------------------------------------------------------------------------------------------------------------------------------------------------------------------------------------------------------------------------------  NUTRITION: -eat real food: lots of colorful vegetables (half the plate) and fruits -5-7 servings of vegetables and fruits per day (fresh or steamed is best), exp. 2 servings of vegetables with lunch and dinner and 2 servings of fruit per day. Berries and greens such as kale and collards are great choices.  -consume on a regular basis: whole grains (make sure first ingredient on label contains the word "whole"), fresh fruits, fish, nuts, seeds, healthy oils (such as olive oil, avocado oil, grape seed oil) -may eat small amounts of dairy and lean meat on occasion, but avoid processed meats such as ham, bacon, lunch meat, etc. -drink water -try to avoid fast food and pre-packaged foods, processed meat -most experts advise limiting sodium to < 23075mper day, should limit further is any chronic conditions such as high blood pressure, heart disease, diabetes, etc. The American Heart Association advised that < 150035ms is ideal -try to avoid foods that contain any ingredients with names you do not recognize  -try to avoid sugar/sweets (except for the natural sugar that occurs in fresh fruit) -try to avoid sweet drinks -try to avoid white rice, white bread, pasta (unless whole grain), white or yellow potatoes  EXERCISE GUIDELINES FOR ADULTS: -if you wish to increase your physical activity, do so gradually and with the approval of your doctor -STOP and seek medical care immediately if you have any chest pain, chest discomfort or trouble breathing when starting or increasing exercise  -move and stretch your body, legs, feet and arms when sitting for long periods -Physical activity guidelines for optimal health in adults: -least 150 minutes per week of  aerobic exercise (can talk, but not sing) once approved by your doctor, 20-30 minutes of sustained activity or two 10 minute episodes of sustained activity every day.  -resistance training at least 2 days per week if approved by your doctor -balance exercises 3+ days per week:   Stand somewhere where you have something sturdy to hold onto if you lose balance.    1) lift up on toes, start with 5x per day and work up to 20x   2) stand and lift on leg straight out to the side so that foot is a few inches of the floor, start with 5x each side and work up to 20x each side   3) stand on one foot, start with 5 seconds each side and work up to 20 seconds on each side  If you need ideas or help with getting more  active:  -Silver sneakers https://tools.silversneakers.com  -Walk with a Doc: http://stephens-thompson.biz/  -try to include resistance (weight lifting/strength building) and balance exercises twice per week: or the following link for ideas: ChessContest.fr  UpdateClothing.com.cy  STRESS MANAGEMENT: -can try meditating, or just sitting quietly with deep breathing while intentionally relaxing all parts of your body for 5 minutes daily -if you need further help with stress, anxiety or depression please follow up with your primary doctor or contact the wonderful folks at [place]: [place]: -options in [place] if you wish to engage in more social and exercise related activities:  -Silver sneakers https://tools.silversneakers.com  -Walk with a Doc: http://stephens-thompson.biz/  -Check out the [place] 50+ section on the [place] [employer] (hiking clubs, book clubs, cards and games, chess, exercise classes, aquatic classes and much more) - see the website for  details: https://www.-[place].gov/departments/parks-recreation/active-adults50  -YouTube has lots of exercise videos for different ages and abilities as well  -[place] (a variety of indoor and outdoor inperson activities for adults). ([phone] 663-9218. [address]  -Virtual Online Classes (a variety of topics): see seniorplanet.org or call 978 553 0060  -consider volunteering at a school, hospice center, church, senior center or elsewhere           Lucretia Kern, DO

## 2022-05-10 ENCOUNTER — Encounter: Payer: Self-pay | Admitting: Family Medicine

## 2022-05-10 DIAGNOSIS — R490 Dysphonia: Secondary | ICD-10-CM

## 2022-05-10 NOTE — Telephone Encounter (Signed)
Patient declined OV, wants referral to ENT.

## 2022-05-15 NOTE — Telephone Encounter (Signed)
I did the referral 

## 2022-05-16 ENCOUNTER — Encounter: Payer: Self-pay | Admitting: Family Medicine

## 2022-05-16 ENCOUNTER — Ambulatory Visit: Payer: Medicare PPO | Admitting: Family Medicine

## 2022-05-16 VITALS — BP 128/82 | HR 80 | Temp 97.6°F | Wt 163.0 lb

## 2022-05-16 DIAGNOSIS — R490 Dysphonia: Secondary | ICD-10-CM | POA: Diagnosis not present

## 2022-05-16 MED ORDER — ESOMEPRAZOLE MAGNESIUM 20 MG PO CPDR: 20.0000 mg | DELAYED_RELEASE_CAPSULE | Freq: Two times a day (BID) | ORAL | 0 refills | Status: AC

## 2022-05-16 NOTE — Progress Notes (Signed)
   Subjective:    Patient ID: Maria Schneider, female    DOB: 1945/09/09, 77 y.o.   MRN: [id number]  HPI Here for 6 weeks of hoarseness. We saw her on 04-05-22 when she had a ST and a hoarse voice, and we felt this was due to a viral URI. However the ST and all other symptoms have resolved, but the hoarseness remains. She takes OTC Nexium 20 mg every morning. She quit smoking 14 years ago.    Review of Systems  Constitutional: Negative.   HENT:  Positive for voice change. Negative for congestion, postnasal drip, sinus pressure, sore throat and trouble swallowing.   Eyes: Negative.   Respiratory: Negative.         Objective:   Physical Exam Constitutional:      Appearance: Normal appearance.     Comments: Her voice is hoarse   HENT:     Right Ear: Tympanic membrane, ear canal and external ear normal.     Left Ear: Tympanic membrane, ear canal and external ear normal.     Nose: Nose normal.     Mouth/Throat:     Pharynx: Oropharynx is clear.  Eyes:     Conjunctiva/sclera: Conjunctivae normal.  Pulmonary:     Effort: Pulmonary effort is normal.     Breath sounds: Normal breath sounds.  Lymphadenopathy:     Cervical: No cervical adenopathy.  Neurological:     Mental Status: She is alert.           Assessment & Plan:  Chronic hoarseness. This is likely to be due to nighttime GERD, so I advised her to increase the Nexium to BID. We will also refer her to ENT to evaluate. [name], MD

## 2022-05-23 ENCOUNTER — Encounter: Payer: Self-pay | Admitting: Family Medicine

## 2022-05-24 NOTE — Telephone Encounter (Signed)
Yes it should be fine to wait until that date

## 2022-05-26 ENCOUNTER — Other Ambulatory Visit: Payer: Self-pay | Admitting: Family Medicine

## 2022-06-02 ENCOUNTER — Other Ambulatory Visit: Payer: Self-pay | Admitting: Internal Medicine

## 2022-06-02 DIAGNOSIS — I2510 Atherosclerotic heart disease of native coronary artery without angina pectoris: Secondary | ICD-10-CM

## 2022-06-02 MED ORDER — REPATHA SURECLICK 140 MG/ML ~~LOC~~ SOAJ: 140.0000 mg | SUBCUTANEOUS | 11 refills | Status: DC

## 2022-06-02 NOTE — Telephone Encounter (Signed)
*  STAT* If patient is at the pharmacy, call can be transferred to refill team.   1. Which medications need to be refilled? (please list name of each medication and dose if known) Evolocumab (REPATHA SURECLICK) [id number] MG/ML SOAJ    2. Which pharmacy/location (including street and city if local pharmacy) is medication to be sent to? [id number] - [place], [place] - [address]   3. Do they need a 30 day or 90 day supply? 30 day

## 2022-06-06 ENCOUNTER — Other Ambulatory Visit: Payer: Self-pay

## 2022-06-06 DIAGNOSIS — E785 Hyperlipidemia, unspecified: Secondary | ICD-10-CM

## 2022-06-06 DIAGNOSIS — I10 Essential (primary) hypertension: Secondary | ICD-10-CM

## 2022-06-27 ENCOUNTER — Other Ambulatory Visit: Payer: Self-pay | Admitting: Family Medicine

## 2022-06-27 DIAGNOSIS — H0015 Chalazion left lower eyelid: Secondary | ICD-10-CM | POA: Diagnosis not present

## 2022-07-05 ENCOUNTER — Ambulatory Visit: Payer: Medicare PPO | Admitting: Neurology

## 2022-07-06 ENCOUNTER — Ambulatory Visit: Payer: Medicare PPO | Admitting: Internal Medicine

## 2022-07-13 ENCOUNTER — Ambulatory Visit: Admit: 2022-07-13 | Payer: MEDICARE | Primary: Family Medicine

## 2022-07-13 ENCOUNTER — Ambulatory Visit: Admit: 2022-07-13 | Discharge: 2022-07-13 | Payer: MEDICARE | Primary: Family Medicine

## 2022-07-13 DIAGNOSIS — M25511 Pain in right shoulder: Secondary | ICD-10-CM

## 2022-07-13 DIAGNOSIS — R490 Dysphonia: Secondary | ICD-10-CM | POA: Diagnosis not present

## 2022-07-13 DIAGNOSIS — K219 Gastro-esophageal reflux disease without esophagitis: Secondary | ICD-10-CM | POA: Diagnosis not present

## 2022-07-13 NOTE — Progress Notes (Signed)
Lorraine Freeman (DOB:  Sep 26, 1945) is a 77 y.o. female, presents for evaluation of the following chief complaint(s):  Shoulder Pain (Right shoulder pain )      Subjective   HPI  Zeba returns to our office with a new complaint of right shoulder pain.  Dr. Cheree Ditto did repair her right rotator cuff many years ago and eventually referred her to Dr. Samuella Cota when she read tore her rotator cuff and he did do a anatomic shoulder replacement in 2009.  She has been doing very well overall but over the last year she has noticed a progressive loss of strength within the right shoulder, weakness.  She has had to buy lighter pans to cook, cannot hold a book to read secondary to the stress on her right shoulder.  Again it is not super painful just feels weak, fatigued.  No new events or injuries.  She would like to discuss next steps.  Of note her daughter passed away few weeks ago and she has been recovering from this.  Ht 1.6 m (5\' 3" )   Wt 78.5 kg (173 lb)   LMP  (LMP Unknown)   BMI 30.65 kg/m    Allergies   Allergen Reactions    Levaquin [Levofloxacin] Palpitations and Other (See Comments)     Event:        Dilaudid [Hydromorphone]     Flurbiprofen      Other reaction(s): Unknown    Nsaids      Other reaction(s): stomach upset    Ezetimibe Other (See Comments)      Current Outpatient Medications   Medication Sig Dispense Refill    vitamin B-12 (CYANOCOBALAMIN) 100 MCG tablet Vitamin B-12      ALPRAZolam (XANAX) 0.25 MG tablet Take 2 tablets by mouth 2 times daily as needed (vocal cord paralysis) for up to 90 days. Max Daily Amount: 1 mg 360 tablet 0    omeprazole (PRILOSEC) 40 MG delayed release capsule Take 1 capsule by mouth daily 90 capsule 1    clobetasol (TEMOVATE) 0.05 % ointment Apply topically 2 times daily. 60 g 3    MAGNESIUM PO Take by mouth      BIOTIN PO Take by mouth      estradiol (ESTRACE VAGINAL) 0.1 MG/GM vaginal cream Place 1 g vaginally daily Nightly x 2 weeks, every other night x 2 weeks, then as  needed 42.5 g 3    albuterol sulfate HFA (PROVENTIL;VENTOLIN;PROAIR) 108 (90 Base) MCG/ACT inhaler Ventolin HFA 90 mcg/actuation aerosol inhaler      vitamin D3 (CHOLECALCIFEROL) 125 MCG (5000 UT) TABS tablet   5,000 units = tabs, Oral, Daily, # 100 tabs, 0 Refill(s)      calcium carbonate 1500 (600 Ca) MG TABS tablet Take 2 tablets by mouth daily as needed      acetaminophen (TYLENOL) 325 MG tablet 1 tablet as needed Orally every 6 hrs       No current facility-administered medications for this visit.      Past Medical History:   Diagnosis Date    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) 03/22/2022    DX'D 1993    Adhesive capsulitis of shoulder 02/07/2021    Asthma 1999    Chondromalacia of patella, right 02/07/2021    Chronic back pain     Complex tear of medial meniscus of right knee, subsequent encounter 02/09/2021    Complex tear of medial meniscus, current injury, right knee, subsequent encounter 02/07/2021    Drug  effect Levaquin    Tachycardia, sob    Essential hypertension 01/19/2020    Fibromyalgia     GERD (gastroesophageal reflux disease) 7 yrs. Ago    Erosive gastritis    Hepatic steatosis 04/06/2022    Per 03/30/22 CT    High cholesterol     Lumbar radiculopathy 07/26/2020    Lung cancer (HCC) 1993    Malignant neoplasm of skin 06/21/2020    Obesity     Osteoarthritis     Osteopenia     Patellofemoral syndrome of right knee 02/07/2021    Restrictive lung disease 03/22/2022    Rotator cuff tear 02/07/2021    Sciatica     Spasmodic dysphonia     left vocal cord paralysis      Past Surgical History:   Procedure Laterality Date    BREAST REDUCTION SURGERY      CHOLECYSTECTOMY      ELBOW SURGERY Right     EYE SURGERY      Cataracts, laser    FRACTURE SURGERY      Both wrists    JOINT REPLACEMENT      R shoulder, R ulna    KNEE ARTHROPLASTY      Torn meniscus    KNEE SURGERY  11/26/2018    Arthroscopy right knee with partial medial meniscectomy and chondroplasty Dr. Cheree Ditto    LOBECTOMY  1980    apical  resection    LUMBAR FUSION  2001    Dr. Marygrace Drought SPINE SURGERY  2003    hardware removal    SHOULDER ARTHROPLASTY Right 06/2012    Dr Samuella Cota    SHOULDER SURGERY Right 1996    Dr. Cheree Ditto    SHOULDER SURGERY Right 2011    Dr. Cheree Ditto    SKIN CANCER EXCISION      multiple    SUBMANDIBULAR GLAND EXCISION      THORACOTOMY  1993    TUBAL LIGATION  1980    UPPER GASTROINTESTINAL ENDOSCOPY  03-2021    Erosive gastritis    WRIST SURGERY Bilateral     ORIF, bilat wrist fxs    WRIST SURGERY  2007    both wrist    WRIST SURGERY  2005    broken wrist      Family History   Problem Relation Age of Onset    Colon Cancer Mother     Stroke Father     Abdominal aortic aneurysm Father     Diabetes Father     High Blood Pressure Father     Prostate Cancer Father     Hypertension Father     Heart Surgery Brother     Heart Surgery Brother     Arthritis Brother         Back,legs,shoulder    High Cholesterol Brother     Esophageal Cancer Brother     Cancer Brother         Tongue    Abdominal aortic aneurysm Brother     Cancer Brother         Adenoid    Cancer Son         tongue cancer    Colon Cancer Maternal Grandmother     Breast Cancer Paternal Grandmother     Breast Cancer Maternal Aunt         Breast surgery      Social History     Occupational History    Not on file   Tobacco  Use    Smoking status: Former     Current packs/day: 0.00     Average packs/day: 1 pack/day for 13.8 years (13.8 ttl pk-yrs)     Types: Cigarettes     Start date: 06/19/1963     Quit date: 03/20/1977     Years since quitting: 45.3    Smokeless tobacco: Never   Vaping Use    Vaping Use: Never used   Substance and Sexual Activity    Alcohol use: Not Currently     Alcohol/week: 10.0 standard drinks of alcohol     Types: 10 Glasses of wine per week     Comment: 2/day    Drug use: Never    Sexual activity: Not Currently     Partners: Male        Review of Systems:  Constitutional:  Negative for activity change.   HENT:  Negative for congestion and trouble swallowing.     Respiratory:  Negative for shortness of breath.  negative Obstructive sleep apnea.  Cardiovascular:  Negative for chest pain. negative Blood thinners. negative hypertension.   Gastrointestinal:  Negative for abdominal pain.   Endocrine: Negative for heat or cold intolerance. negative Diabetes mellitus.   Skin:  Negative for rash.  Allergic/Immunologic: Negative for immunocompromised state.   Neurological:  Negative for dizziness.   Psychiatric/Behavioral:  Negative for behavioral problems.            Objective   Physical Exam  Right shoulder:  Inspection: no effusion, no deformity, no scapular winging, no muscle asymmetry, no atrophy.  No tenderness to palpation.  ROM: near -Full passive and active ROM, painless ROM  Strength: Breakaway weakness secondary to pain, strength diminished over  Tests: positive empty can, negative impingement, negative speed, positive Neer, No motor or sensory deficits.    Left shoulder:  Inspection: no effusion, no deformity, no scapular winging, no muscle asymmetry, no atrophy.  No tenderness to palpation.  ROM: Full passive and active ROM, painless ROM  Strength: 5/5.  Tests: negative empty can,negative impingement, negative speed, negative liftoff, negative Neer, negative Obrien's. No motor or sensory deficits.         Radiology:  XR SHOULDER RIGHT (MIN 2 VIEWS) (CLINIC PERFORMED)  Well-preserved implant, no signs of acute fracture or dislocation.    Possible superior humeral head migration.  Advanced AC joint   osteoarthritis       Reviewed and discussed interpretations and findings with patient.       Assessment & Plan   ASSESSMENT/PLAN:    ICD-10-CM    1. Right shoulder pain, unspecified chronicity  M25.511 XR SHOULDER RIGHT (MIN 2 VIEWS) (CLINIC PERFORMED)         Glorianna is suffering from what I believe may be a rotator cuff tear, her rotator cuff was preserved back in 2009 when Dr. Samuella Cota did the shoulder arthroplasty.  After discussing alternative treatment options with both  Dr. Samuella Cota and Zenda Alpers, after discussing physical therapy, and further imaging, we decided to have her evaluated by Dr. Samuella Cota at the soonest availability to discuss treatment options.  I encouraged her to do her at home exercises in the meantime.  Discussed adverse effects of medications. Instructed to call if they have any questions or concerns, follow up with Dr. Samuella Cota.      An electronic signature was used to authenticate this note.    --Arnoldo Lenis, PA-C

## 2022-07-20 ENCOUNTER — Ambulatory Visit: Admit: 2022-07-20 | Discharge: 2022-07-20 | Payer: MEDICARE | Primary: Family Medicine

## 2022-07-20 ENCOUNTER — Encounter: Admit: 2022-07-20 | Discharge: 2022-07-20 | Payer: MEDICARE | Attending: Orthopaedic Surgery | Primary: Family Medicine

## 2022-07-20 DIAGNOSIS — M75101 Unspecified rotator cuff tear or rupture of right shoulder, not specified as traumatic: Secondary | ICD-10-CM

## 2022-07-20 NOTE — Progress Notes (Signed)
Lorraine Freeman (DOB:  [birth year]) is a 77 y.o. female, here for evaluation of the following chief complaint(s):  New Patient (S/p 8-9 years ago w/ Dr.Price right shoulder/Onset 2 years right shoulder pain pt reports when writing has weakness./Range of motion is good pt also stated that she exercises the right arm but is experiencing pain./Reaching, lifting is bothersome.)      Subjective   SUBJECTIVE/OBJECTIVE:  HPI  Lorraine Freeman is here for evaluation of her shoulder she was seen by Dewaine Conger on [address] for shoulder pain.  I did anatomic total shoulder placement on her in 2009 and she had done quite well until recently may be about 2 years she started getting pain in the shoulder and occasional weakness.  She also notices some weakness and fine motor activity as well from time to time but her chief complaint is shoulder subacromial discomfort.  She has trouble with reaching overhead with the shoulder and her activities of daily living are limited when she tries to reach in a cabinet or lower something down the arm above.  She has a hard time cooking at her stove top if she has to raise her arm up.  Allergies   Allergen Reactions    Levaquin [Levofloxacin] Palpitations and Other (See Comments)     Event:        Dilaudid [Hydromorphone]     Flurbiprofen      Other reaction(s): Unknown    Nsaids      Other reaction(s): stomach upset    Ezetimibe Other (See Comments)      Current Outpatient Medications   Medication Sig Dispense Refill    vitamin B-12 (CYANOCOBALAMIN) 100 MCG tablet Vitamin B-12      ALPRAZolam (XANAX) 0.25 MG tablet Take 2 tablets by mouth 2 times daily as needed (vocal cord paralysis) for up to 90 days. Max Daily Amount: 1 mg 360 tablet 0    omeprazole (PRILOSEC) 40 MG delayed release capsule Take 1 capsule by mouth daily 90 capsule 1    clobetasol (TEMOVATE) 0.05 % ointment Apply topically 2 times daily. 60 g 3    MAGNESIUM PO Take by mouth      BIOTIN PO Take by mouth      estradiol (ESTRACE  VAGINAL) 0.1 MG/GM vaginal cream Place 1 g vaginally daily Nightly x 2 weeks, every other night x 2 weeks, then as needed 42.5 g 3    vitamin D3 (CHOLECALCIFEROL) 125 MCG (5000 UT) TABS tablet   5,000 units = tabs, Oral, Daily, # 100 tabs, 0 Refill(s)      calcium carbonate 1500 (600 Ca) MG TABS tablet Take 2 tablets by mouth daily as needed      acetaminophen (TYLENOL) 325 MG tablet 1 tablet as needed Orally every 6 hrs      albuterol sulfate HFA (PROVENTIL;VENTOLIN;PROAIR) 108 (90 Base) MCG/ACT inhaler Ventolin HFA 90 mcg/actuation aerosol inhaler (Patient not taking: Reported on 07/20/2022)       No current facility-administered medications for this visit.      Past Medical History:   Diagnosis Date    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) 03/22/2022    DX'D 1993    Adhesive capsulitis of shoulder 02/07/2021    Asthma 1999    Chondromalacia of patella, right 02/07/2021    Chronic back pain     Complex tear of medial meniscus of right knee, subsequent encounter 02/09/2021    Complex tear of medial meniscus, current injury, right knee, subsequent  encounter 02/07/2021    Drug effect Levaquin    Tachycardia, sob    Essential hypertension 01/19/2020    Fibromyalgia     GERD (gastroesophageal reflux disease) 7 yrs. Ago    Erosive gastritis    Hepatic steatosis 04/06/2022    Per 03/30/22 CT    High cholesterol     Lumbar radiculopathy 07/26/2020    Lung cancer (HCC) 1993    Malignant neoplasm of skin 06/21/2020    Obesity     Osteoarthritis     Osteopenia     Patellofemoral syndrome of right knee 02/07/2021    Restrictive lung disease 03/22/2022    Rotator cuff tear 02/07/2021    Sciatica     Spasmodic dysphonia     left vocal cord paralysis      Past Surgical History:   Procedure Laterality Date    BREAST REDUCTION SURGERY      CHOLECYSTECTOMY      ELBOW SURGERY Right     EYE SURGERY      Cataracts, laser    FRACTURE SURGERY      Both wrists    JOINT REPLACEMENT      R shoulder, R ulna    KNEE ARTHROPLASTY      Torn  meniscus    KNEE SURGERY  11/26/2018    Arthroscopy right knee with partial medial meniscectomy and chondroplasty Dr. Cheree Ditto    LOBECTOMY  1980    apical resection    LUMBAR FUSION  2001    Dr. Marygrace Drought SPINE SURGERY  2003    hardware removal    SHOULDER ARTHROPLASTY Right 06/2012    Dr Samuella Cota    SHOULDER SURGERY Right 1996    Dr. Cheree Ditto    SHOULDER SURGERY Right 2011    Dr. Cheree Ditto    SKIN CANCER EXCISION      multiple    SUBMANDIBULAR GLAND EXCISION      THORACOTOMY  1993    TUBAL LIGATION  1980    UPPER GASTROINTESTINAL ENDOSCOPY  03-2021    Erosive gastritis    WRIST SURGERY Bilateral     ORIF, bilat wrist fxs    WRIST SURGERY  2007    both wrist    WRIST SURGERY  2005    broken wrist      Family History   Problem Relation Age of Onset    Colon Cancer Mother     Stroke Father     Abdominal aortic aneurysm Father     Diabetes Father     High Blood Pressure Father     Prostate Cancer Father     Hypertension Father     Heart Surgery Brother     Heart Surgery Brother     Arthritis Brother         Back,legs,shoulder    High Cholesterol Brother     Esophageal Cancer Brother     Cancer Brother         Tongue    Abdominal aortic aneurysm Brother     Cancer Brother         Adenoid    Cancer Son         tongue cancer    Colon Cancer Maternal Grandmother     Breast Cancer Paternal Grandmother     Breast Cancer Maternal Aunt         Breast surgery      Social History     Occupational History  Not on file   Tobacco Use    Smoking status: Former     Current packs/day: 0.00     Average packs/day: 1 pack/day for 13.8 years (13.8 ttl pk-yrs)     Types: Cigarettes     Start date: 06/19/1963     Quit date: 03/20/1977     Years since quitting: 45.3    Smokeless tobacco: Never   Vaping Use    Vaping Use: Never used   Substance and Sexual Activity    Alcohol use: Not Currently     Alcohol/week: 10.0 standard drinks of alcohol     Types: 10 Glasses of wine per week     Comment: 2/day    Drug use: Never    Sexual activity: Not  Currently     Partners: Male       XR SHOULDER RIGHT (MIN 2 VIEWS)  X-ray of the right shoulder and 2 AP view only shows a stable anatomic   total shoulder replacement arthroplasty no periprosthetic loosening of the   humeral stem or the glenoid component.  Slight subacromial humeral   distance narrowing.  Comparable to x-rays taken in 2015.  Additionally she   has old metallic anchor in the greater tuberosity seems to be quiet as   well.  Soft tissues otherwise appear satisfactory there is a small   calcification in the supraspinatus is also unchanged from previous   x-rays.[07/20/2022 12:44:10 PM - PRICE, Yaritzy Huser L JR.]     I reviewed the previous 6 x-rays taken by Dahlia Client and show stable anatomic total shoulder replacement arthroplasty but no true AP view was taken.  I reviewed old x-rays from when she had total shoulder replacement arthroplasty done in the past.  Review of Systems   Musculoskeletal:  Positive for arthralgias.          Objective   Ortho Exam     Well-developed well-nourished female no acute distress.    Right shoulder  INSPECTION: Well-healed anterior incision  PALPATION: Subacromial tenderness  ROM: Flexion 120 degrees, abduction 120 degrees, external rotation  70 degrees, internal rotation the 60 degrees, extension the 30 degrees, adduction the 20 degrees.  STRENGTH TESTING (OUT OF 5): Satisfactory rotator cuff  strength.  No breakaway  STABILITY TESTS: all stability tests are negative.  UPPER EXTREMITY EXAM: sensation intact to light touch  distally,brisk capillary refill.  TESTS: Positive NEER, negative labral grind.    Left shoulder  INSPECTION: No swelling no effusion no asymmetry  PALPATION: No tenderness  ROM: Flexion 106 degrees, abduction to 8100 to degrees, external rotation  The 100 degrees, internal rotation 50 degrees, extension the 50 degrees, adduction 20 degrees.  STRENGTH TESTING (OUT OF 5): Satisfactory rotator cuff  strength.  STABILITY TESTS: all stability tests are  negative.  UPPER EXTREMITY EXAM: sensation intact to light touch  distally,brisk capillary refill.  TESTS: Negative NEER, negative labral grind.           Assessment & Plan   ASSESSMENT/PLAN:  1. Rotator cuff syndrome, right  -     RSF - Occupational Therapy, [facility]  2. History of total shoulder replacement, right  -     RSF - Occupational Therapy,  [facility]  I believe patient is starting he had failure of the rotator cuff around her previous total shoulder replacement arthroplasty.  Present x-rays are not very convincing to 100% that this has happened but her symptoms would point 1 toward this direction.  Will going try physical therapy first and see if we can get her better she does report that if she had some rest she felt better right away.  Will have her come back and see [place] in a month for follow-up evaluation depending on how she is doing we may or may not need to get CT arthrogram of the shoulder to evaluate cuff integrity.    Return in about 4 weeks (around 08/17/2022).         An electronic signature was used to authenticate this note.    --Wyvonna Plum, MD

## 2022-07-24 ENCOUNTER — Encounter

## 2022-07-24 NOTE — Telephone Encounter (Signed)
Requested Prescriptions     Pending Prescriptions Disp Refills    omeprazole (PRILOSEC) 40 MG delayed release capsule 90 capsule 1     Sig: Take 1 capsule by mouth daily

## 2022-07-25 ENCOUNTER — Telehealth: Payer: Self-pay

## 2022-07-25 MED ORDER — OMEPRAZOLE 40 MG PO CPDR
40 MG | ORAL_CAPSULE | Freq: Every day | ORAL | 3 refills | Status: DC
Start: 2022-07-25 — End: 2023-08-02

## 2022-07-25 NOTE — Progress Notes (Unsigned)
Patient ID: Maria Schneider, female   DOB: May 13, 1945, 77 y.o.   MRN: [id number]   Care Management & Coordination Services Pharmacy Team  Reason for Encounter: Chart Prep for initial encounter with Milas Kocher Clinical Pharmacist on 08/02/22 at 10 am in office.  {US HC Outreach:[id number]}  Have you seen any other providers since your last visit? **{YES NO:22349} Any changes in your medications or health? {YES NO:22349} Any side effects from any medications? {YES NO:22349} Do you have an symptoms or problems not managed by your medications? {YES NO:22349} Any concerns about your health right now? {YES NO:22349} Has your provider asked that you check blood pressure, blood sugar, or follow special diet at home? {YES NO:22349} Do you get any type of exercise on a regular basis? {YES NO:22349} Can you think of a goal you would like to reach for your health? *** Do you have any problems getting your medications? {YES NO:22349} Is there anything that you would like to discuss during the appointment? ***  Patient assistance for the following mediations  Patient aware to bring blood pressure cuff, medications that do not need refrigeration and supplements to appointment   Chart review:  Recent office visits:  05/16/25 Nelwyn Salisbury, MD - Patient presented for Hoarseness. Prescribed Nexium. Stopped Famotidine. Stopped Medrol Dosepak.   05/04/22 Terressa Koyanagi, DO - Patient presented for Medicare Annual Wellness Exam. No medication changes.   04/05/22 Nelwyn Salisbury, MD - Patient presented for Viral URI with cough and other concerns.   Recent consult visits:  04/20/22 Parke Poisson, MD - Patient presented for Coronary artery disease involving native coronary artery of native heart without angina pectoris and other concerns. No medication changes.   03/06/22 Drema Dallas, DO (Neurology) - Patient presented for Memory deficits and other concerns. No medication changes.   Hospital visits:  None in  previous 6 months  Fill History : bupropion HCl XL 300 mg 24 hr tablet, extended release 07/25/2022 90   REPATHA 140 MG/ML SURECLICK 06/29/2022 28   ezetimibe 10 mg tablet 07/21/2022 90   FENOFIBRATE 160 MG TABLET 06/16/2022 90   METHYLPREDNISOLONE 4 MG DOSEPK 04/10/2022 6   METOPROLOL SUCC ER 25 MG TAB 06/27/2022 30   MYRBETRIQ ER 50 MG TABLET 06/19/2022 30   LAGEVRIO 200 MG CAP (EUA) 10/13/2021 5   sertraline 100 mg tablet 07/21/2022 45   VALSARTAN 80 MG TABLET 05/26/2022 90     Star Rating Drugs:  Valsartan 80 mg - Last filled 05/26/22 90 DS at [facility]   Care Gaps: PNA Vaccine - Overdue Zoster Vaccine - Overdue COVID Booster - Postponed AWV - 05/04/22   Pamala Duffel CMA Clinical Pharmacist Assistant [phone]

## 2022-07-26 ENCOUNTER — Inpatient Hospital Stay: Admit: 2022-07-26 | Payer: MEDICARE | Primary: Family Medicine

## 2022-07-26 DIAGNOSIS — M25519 Pain in unspecified shoulder: Secondary | ICD-10-CM

## 2022-07-26 DIAGNOSIS — M75101 Unspecified rotator cuff tear or rupture of right shoulder, not specified as traumatic: Secondary | ICD-10-CM

## 2022-07-26 NOTE — Other (Signed)
[facility]  [address] [place] [address]  Phone: [phone]  Fax: [phone]  Outpatient Occupational Therapy Evaluation  Episodes    Lorraine Freeman   (77 y.o. female)  DOB [birth year]    Medical & Insurance Info OT Plan of Care & Visit info   Referring Physician: Wyvonna Plum., MD   Plan of Care Certification Period  07/26/2022 to Certification Period Expiration Date: 10/18/22   Date of Onset   Onset Date: 07/26/22   Frequency   Plan Frequency: 2x/week  Plan Weeks: 12   Primary Insurance Medicare Part A And B  Secondary Insurance [employer]M MEDICARE SUPP OT Visit Info  Total # of Visits to Date: 1     Progress Note Counter: 1   Medical Diagnosis: Rotator cuff syndrome, right [M75.101]  History of total shoulder replacement, right [Z96.611]  Visit Diagnosis  Visit Diagnoses         Codes    Pain in shoulder region after shoulder replacement    -  Primary M25.519, Z96.619    Weakness of right shoulder     R29.898            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), Adhesive capsulitis of shoulder, Asthma, Chondromalacia of patella, right, Chronic back pain, Complex tear of medial meniscus of right knee, subsequent encounter, Complex tear of medial meniscus, current injury, right knee, subsequent encounter, Drug effect, Essential hypertension, Fibromyalgia, GERD (gastroesophageal reflux disease), Hepatic steatosis, High cholesterol, Lumbar radiculopathy, Lung cancer (HCC), Malignant neoplasm of skin, Obesity, Osteoarthritis, Osteopenia, Patellofemoral syndrome of right knee, Restrictive lung disease, Rotator cuff tear, Sciatica, and Spasmodic dysphonia.  Lorraine Freeman  has a past surgical history that includes lumbar fusion (2001); Lumbar spine surgery (2003); Wrist surgery (Bilateral); thoracotomy (1993); Lung lobectomy (1980); Wrist surgery (2007); Wrist surgery (2005); shoulder surgery  (Right, 1996); shoulder surgery (Right, 2011); Elbow surgery (Right); Total shoulder arthroplasty (Right, 06/2012); Cholecystectomy; Breast reduction surgery; knee surgery (11/26/2018); Tubal ligation (1980); Submandibular gland excision; Skin cancer excision; joint replacement; eye surgery; fracture surgery; Knee Arthroplasty; and Upper gastrointestinal endoscopy (03-2021).  General  Self reported health status:: Good  History obtained from:: Patient  Family/Caregiver Present: No  Diagnosis: Right rotator cuff syndrome with history of total shoulder replacement  Allergies Levaquin [levofloxacin], Dilaudid [hydromorphone], Flurbiprofen, Nsaids, and Ezetimibe    Learning   Learning  Does the patient/guardian have any barriers to learning?: No barriers  Will there be a co-learner?: No  What is the preferred language of the patient/guardian?: English  Is an interpreter required?: No  How does the patient/guardian prefer to learn new concepts?: Demonstration, Pictures/Videos  Restrictions/Precautions   Restrictions/Precautions  Restrictions/Precautions: Other (comment)  Required Braces or Orthoses?: No  Position Activity Restriction  Other position/activity restrictions: no strenghening past 45 degree angle of body   SUBJECTIVE   Subjective  Subjective: Lorraine Freeman is a right hand dominant female with right shoulder pain and weakness. Lorraine Freeman reports arm feels weak and feels painful in upper arm. Lorraine Freeman reports she has had several surgeries on shoulder over the years including a shoulder replacement.  Lorraine Freeman referred o therapy for heat, ultrasound, massage and strengthening exs keeping arm no further than 45 degrees from body with strengthening.    Pain   Pain Screening  Patient Currently in Pain: Yes  Pain Assessment: 0-10  Pain Level: 4  Post Treatment Pain Level: 4  Pain Location: Shoulder  Pain Orientation: Right  Pain Descriptors: Discomfort, Aching      OBJECTIVE   Observation:   AROM         RUE AROM (degrees)  R Shoulder Flexion  0-180: 95  R Shoulder Extension 0-45: 50  R Shoulder ABduction 0-180: 85  R Shoulder Int Rotation  0-70: 35  R Shoulder Ext Rotation 0-90: 30       Functional Outcomes  QuickDASH        Total Disability/Symptom Score: 32  / 100 (Date: 07/26/2022)    Interpretation of Score: The QuickDASH is designed to measure the activities of daily living in person's with upper extremity dysfunction or pain.  Each section is scored on a 1-5 scale, 5 representing the greatest disability. The scores of each section are added together and calculated to determine percentage of disability (no disability = 0%, total disability = 100%).    OT Treatment Completed:  Exercises: Therapeutic exercise (CPT 97110)   Treatment Reasoning    OT Exercise 1: light [place] exs  OT Exercise 2: pendulums  OT Exercise 3: table slides  OT Exercise 4: gentle passive ROM while supine  OT Exercise 5: massage    Limitations addressed: Mobility, Flexibility  Therapist provided: Assistance  Functional ability(s) targeted: Reaching overhead, Performing self care actvities, Return to work tasks, Tolerance to age appropriate activities   1:1 Time (minutes): 30  Unbillable time (minutes): 0  Total Time: 30           Moist Heat: (CPT 97010) Treatment Reasoning    Patient Position: Seated  Moist heat location: Right, Shoulder  Post treatment skin assessment: Intact Limitations addressed: Pain modulation, Tissue extensibility, Joint mobility  Therapist provided: Assistance  Functional ability(s) targeted: Reaching overhead, Performing self care activities, Return to work tasks, Tolerance to age appropriate activities    Untimed (minutes): 0  Unbillable time (minutes): 5  Total Time: 5        ASSESSMENT   Assessment  Assessment: ROM limitations due to discomfort. Most discomfort in upper arm area that radiates towards elbow. Lorraine Freeman also reports weakness in her hand. Instructed in and performed gentle [place] exs with pink theraband and Lorraine Freeman cautioned against going past 45  degree angle from body. Massage painful on upper tricep and bicep area. Lorraine Freeman seemed to understand [place] exs and was provided with written/illustrated HEP and pink theraband, Ultrasound not performed today as Lorraine Freeman had on long sleeve shirt. See 2x/week to increase strength and decrease discomfort.    Education  OT Education  OT Education: Home Exercise Program  Patient Education: gentle [place] exs  Barriers impacting rehab : None    Impairments  Performance deficits / Impairments: Decreased strength, Decreased ROM    Therapy Prognosis  Prognosis: Fair, Good    Eval Complexity  Decision Making: Low Complexity  PLAN   Interventions Planned (Treatment may consist of any combination of the following):    Current Treatment Recommendations: Strengthening, ROM, Pain management, Modalities    Next Visit Recommendation   Specific Instructions for Next Treatment: gentle strengthening, modalities, massage    Goals  Patient Goal(s): Patient goals : Strengthen arm - help with fine motor movements  Short Term Goals Completed by 6 weeks Goal Status   Instruction in gentle strengthening exs to shoulder STG Goal 1 Status:: New, In progress   Use of modalities as needed to decease discomfort STG Goal 2 Status:: New, In progress   Instruction in gentle AROM exs to  shoulder STG Goal 3 Status:: New, In progress   Massage to shoulder area to ecrease discomfort STG Goal 4 Status:: New, In progress   Provide with written/illustrated HEPs STG Goal 5 Status:: New, In progress                 Long Term Goals Completed by 12 weeks Goal Status   Indepenent HEP LTG Goal 1 Status:: New   Decraese discomfort to occassional LTG Goal 2 Status:: New                                   Total Duration   OT Individual Minutes  Time In: 0915  Time Out: 1000  Minutes: 45    Therapist Signature: Colin Broach, OTR/L, CHT    Date: 03/25/1094     I certify that the above therapy services are being furnished while the patient is under my care. I agree with the  treatment plan and certify that this therapy is necessary.      Physician Signature:Price, Burna Mortimer., MD    _______________________________ Date _____________      Please sign and return to [facility] Lorraine [facility].  Please fax to the location listed above. THANK YOU for this referral!

## 2022-07-27 ENCOUNTER — Inpatient Hospital Stay: Admit: 2022-07-27 | Payer: MEDICARE | Primary: Family Medicine

## 2022-07-27 NOTE — Progress Notes (Signed)
[facility]  [address] [place] [address]  Phone: [phone]  Fax: [phone]  Outpatient Occupational Therapy Treatment Episodes    Lorraine Freeman   (77 y.o. female)  DOB [birth year]    Medical & Insurance Info OT Plan of Care & Visit info   Referring Physician: Wyvonna Plum., MD Certification Period Expiration Date: 10/18/22     Onset Date: 07/26/22     Plan Frequency: 2x/week    Plan Weeks: 12     Primary Insurance Medicare Part A And B  Secondary Insurance [employer]M MEDICARE SUPP OT Visit Info  Total # of Visits to Date: 2     Progress Note Counter: 1   Medical Diagnosis: No admission diagnoses are documented for this encounter.    Treatment Diagnosis: right rotator cuff syndrome.      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), Adhesive capsulitis of shoulder, Asthma, Chondromalacia of patella, right, Chronic back pain, Complex tear of medial meniscus of right knee, subsequent encounter, Complex tear of medial meniscus, current injury, right knee, subsequent encounter, Drug effect, Essential hypertension, Fibromyalgia, GERD (gastroesophageal reflux disease), Hepatic steatosis, High cholesterol, Lumbar radiculopathy, Lung cancer (HCC), Malignant neoplasm of skin, Obesity, Osteoarthritis, Osteopenia, Patellofemoral syndrome of right knee, Restrictive lung disease, Rotator cuff tear, Sciatica, and Spasmodic dysphonia.  Lorraine Freeman  has a past surgical history that includes lumbar fusion (2001); Lumbar spine surgery (2003); Wrist surgery (Bilateral); thoracotomy (1993); Lung lobectomy (1980); Wrist surgery (2007); Wrist surgery (2005); shoulder surgery (Right, 1996); shoulder surgery (Right, 2011); Elbow surgery (Right); Total shoulder arthroplasty (Right, 06/2012); Cholecystectomy; Breast reduction surgery; knee surgery (11/26/2018); Tubal ligation (1980); Submandibular gland excision;  Skin cancer excision; joint replacement; eye surgery; fracture surgery; Knee Arthroplasty; and Upper gastrointestinal endoscopy (03-2021).  Allergies Levaquin [levofloxacin], Dilaudid [hydromorphone], Flurbiprofen, Nsaids, and Ezetimibe    Learning   Learning  Does the patient/guardian have any barriers to learning?: No barriers  Will there be a co-learner?: No  What is the preferred language of the patient/guardian?: English  Is an interpreter required?: No  How does the patient/guardian prefer to learn new concepts?: Demonstration; Pictures/Videos      Restrictions/Precautions  Restrictions/Precautions  Restrictions/Precautions: Other (comment)  Required Braces or Orthoses?: No      Position Activity Restriction  Other position/activity restrictions: no strenghening past 45 degree angle of body      SUBJECTIVE   Subjective  Subjective: Pt reports she was able to do exs 2x yesterday but didn't do any today since she eas coming to therapy.  Pain   Pain Screening  Patient Currently in Pain: Yes  Pain Assessment: 0-10  Pain Level: 4  Post Treatment Pain Level: 4  Pain Location: Shoulder  Pain Orientation: Right  Pain Descriptors: Discomfort, Aching      OBJECTIVE   OT Treatment Completed:  Exercises: Therapeutic exercise (CPT 97110)   Treatment Reasoning    OT Exercise 1: light [place] exs  OT Exercise 2: pendulums  OT Exercise 3: table slides  OT Exercise 5: massage    Limitations addressed: Mobility, Flexibility  Therapist provided: Assistance  Functional ability(s) targeted: Reaching overhead, Performing self care actvities, Return to work tasks, Tolerance to age appropriate activities   1:1 Time (minutes): 20  Unbillable time (minutes): 0  Total Time: 20           Ultrasound: (CPT [id number]) Treatment Reasoning  Patient Position: Seated  Ultrasound location: Shoulder, Right  Ultrasound frequency: 1 MHz  Ultrasound intensity (W/cm2): 2  Ultrasound mode: Continuous  Post treatment skin assessment: Intact Limitations  addressed: Pain modulation  Therapist provided: Assistance  Functional ability(s) targeted: Reaching overhead, Performing self care activities, Tolerance to age appropriate activities   1:1 Time (minutes): 8  Unbillable time (minutes): 0  Total Time: 8          Vasopneumatic Device:   (CPT [id number]) Treatment Reasoning    Patient Position: Seated  Post treatment skin assessment: Intact Limitations addressed: Pain modulation  Therapist provided: Assistance  Functional ability(s) targeted: Reaching overhead, Performing self care activities, Tolerance to age appropriate activities    Untimed (minutes): 10  Unbillable time (minutes): 0  Total Time: 10        ASSESSMENT   Assessment  Assessment: Pt required verbal and physical cues to correctly perform exs. Ultrasound and vaso seemed to be beneficial for decreasing discomfort.  OT Education  OT Education  OT Education: Home Exercise Program  Patient Education: gentle [place] exs  Barriers impacting rehab : None  PLAN   Interventions Planned (Treatment may consist of any combination of the following):    Current Treatment Recommendations: Strengthening; ROM; Pain management; Modalities    Next Visit Recommendation   Specific Instructions for Next Treatment: gentle strengthening, modalities, massage  Goals   Patient Goal(s): Patient goals : Strengthen arm - help with fine motor movements  Short Term Goals Completed by 6 weeks Goal Status   Instruction in gentle strengthening exs to shoulder STG Goal 1 Status:: New, In progress   Use of modalities as needed to decease discomfort STG Goal 2 Status:: New, In progress   Instruction in gentle AROM exs to shoulder STG Goal 3 Status:: New, In progress   Massage to shoulder area to ecrease discomfort STG Goal 4 Status:: New, In progress   Provide with written/illustrated HEPs STG Goal 5 Status:: New, In progress                 Long Term Goals Completed by 12 weeks Goal Status   Indepenent HEP LTG Goal 1 Status:: New   Decraese  discomfort to occassional LTG Goal 2 Status:: New                                   Total Duration   OT Individual Minutes  Time In: 1345  Time Out: 1430  Minutes: 45    Therapist Signature: Colin Broach, OTR/L, CHT    Date: 07/28/2022

## 2022-07-31 ENCOUNTER — Inpatient Hospital Stay: Admit: 2022-07-31 | Payer: MEDICARE | Primary: Family Medicine

## 2022-07-31 NOTE — Progress Notes (Signed)
[facility]  [address] [place] [address]  Phone: [phone]  Fax: [phone]  Outpatient Occupational Therapy Treatment Episodes    MATISSE BOISSONNEAULT   (77 y.o. female)  DOB 08/05/1945    Medical & Insurance Info OT Plan of Care & Visit info   Referring Physician: Wyvonna Plum., MD Certification Period Expiration Date: 10/18/22     Onset Date: 07/26/22     Plan Frequency: 2x/week    Plan Weeks: 12     Primary Insurance Medicare Part A And B  Secondary Insurance AN[employer]M MEDICARE SUPP OT Visit Info  Total # of Visits to Date: 3     Progress Note Counter: 1   Medical Diagnosis: Unspecified rotator cuff tear or rupture of right shoulder, not specified as traumatic [M75.101]  Presence of right artificial shoulder joint [Z96.611]    Treatment Diagnosis: right rotator cuff syndrome.      Past Medical History/Comorbidities  Ms. 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), Adhesive capsulitis of shoulder, Asthma, Chondromalacia of patella, right, Chronic back pain, Complex tear of medial meniscus of right knee, subsequent encounter, Complex tear of medial meniscus, current injury, right knee, subsequent encounter, Drug effect, Essential hypertension, Fibromyalgia, GERD (gastroesophageal reflux disease), Hepatic steatosis, High cholesterol, Lumbar radiculopathy, Lung cancer (HCC), Malignant neoplasm of skin, Obesity, Osteoarthritis, Osteopenia, Patellofemoral syndrome of right knee, Restrictive lung disease, Rotator cuff tear, Sciatica, and Spasmodic dysphonia.  Ms. Pedicini  has a past surgical history that includes lumbar fusion (2001); Lumbar spine surgery (2003); Wrist surgery (Bilateral); thoracotomy (1993); Lung lobectomy (1980); Wrist surgery (2007); Wrist surgery (2005); shoulder surgery (Right, 1996); shoulder surgery (Right, 2011); Elbow surgery (Right); Total shoulder arthroplasty (Right, 06/2012); Cholecystectomy; Breast  reduction surgery; knee surgery (11/26/2018); Tubal ligation (1980); Submandibular gland excision; Skin cancer excision; joint replacement; eye surgery; fracture surgery; Knee Arthroplasty; and Upper gastrointestinal endoscopy (03-2021).  Allergies Levaquin [levofloxacin], Dilaudid [hydromorphone], Flurbiprofen, Nsaids, and Ezetimibe    Learning   Learning  Does the patient/guardian have any barriers to learning?: No barriers  Will there be a co-learner?: No  What is the preferred language of the patient/guardian?: English  Is an interpreter required?: No  How does the patient/guardian prefer to learn new concepts?: Demonstration; Pictures/Videos      Restrictions/Precautions  Restrictions/Precautions  Restrictions/Precautions: Other (comment)  Required Braces or Orthoses?: No      Position Activity Restriction  Other position/activity restrictions: no strenghening past 45 degree angle of body      SUBJECTIVE   Subjective  Subjective: Pt reports that she feels ultrasound was helpful - reports that shoulder/arm doesn't feel as "tight".  Pain   Pain Screening  Patient Currently in Pain: Yes  Pain Assessment: 0-10  Pain Level: 3  Post Treatment Pain Level: 3  Pain Location: Shoulder  Pain Orientation: Right  Pain Descriptors: Discomfort, Aching      OBJECTIVE   OT Treatment Completed:  Exercises: Therapeutic exercise (CPT 97110)   Treatment Reasoning    OT Exercise 1: light [place] exs  OT Exercise 2: pendulums  OT Exercise 3: table slides  OT Exercise 5: massage    Limitations addressed: Mobility, Flexibility  Therapist provided: Assistance  Functional ability(s) targeted: Reaching overhead, Performing self care actvities, Return to work tasks, Tolerance to age appropriate activities   1:1 Time (minutes): 20  Unbillable time (minutes): 0  Total Time: 20  Ultrasound: (CPT [id number]) Treatment Reasoning    Patient Position: Seated  Ultrasound location: Shoulder, Right  Ultrasound frequency: 1 MHz  Ultrasound  intensity (W/cm2): 2  Ultrasound mode: Continuous  Post treatment skin assessment: Intact Limitations addressed: Pain modulation  Therapist provided: Assistance  Functional ability(s) targeted: Reaching overhead, Performing self care activities, Tolerance to age appropriate activities   1:1 Time (minutes): 8  Unbillable time (minutes): 0  Total Time: 8        ASSESSMENT   Assessment  Assessment: Less tendernes noted in upper arm with massage. No increase in discomfort with [place] exs.  OT Education  OT Education  OT Education: Home Exercise Program  Patient Education: gentle [place] exs  Barriers impacting rehab : None  PLAN   Interventions Planned (Treatment may consist of any combination of the following):    Current Treatment Recommendations: Strengthening; ROM; Pain management; Modalities    Next Visit Recommendation   Specific Instructions for Next Treatment: gentle strengthening, modalities, massage  Goals   Patient Goal(s): Patient goals : Strengthen arm - help with fine motor movements  Short Term Goals Completed by 6 weeks Goal Status   Instruction in gentle strengthening exs to shoulder STG Goal 1 Status:: New, In progress   Use of modalities as needed to decease discomfort STG Goal 2 Status:: New, In progress   Instruction in gentle AROM exs to shoulder STG Goal 3 Status:: New, In progress   Massage to shoulder area to ecrease discomfort STG Goal 4 Status:: New, In progress   Provide with written/illustrated HEPs STG Goal 5 Status:: New, In progress                 Long Term Goals Completed by 12 weeks Goal Status   Indepenent HEP LTG Goal 1 Status:: New   Decraese discomfort to occassional LTG Goal 2 Status:: New                                   Total Duration   OT Individual Minutes  Time In: 1505  Time Out: 1540  Minutes: 35    Therapist Signature: Colin Broach, OTR/L, CHT    Date: 08/01/2022

## 2022-07-31 NOTE — Progress Notes (Unsigned)
Care Management & Coordination Services Pharmacy Note  07/31/2022 Name:  Maria Schneider MRN:  [id number] DOB:  [birth year]  Summary: ***  Recommendations/Changes made from today's visit: ***  Follow up plan: ***   Subjective: Maria Schneider is an 77 y.o. year old female who is a primary patient of Nelwyn Salisbury, MD.  The care coordination team was consulted for assistance with disease management and care coordination needs.    {CCMTELEPHONEFACETOFACE:21091510} for initial visit.  Recent office visits: 05/16/25 Nelwyn Salisbury, MD - Patient presented for Hoarseness. Prescribed Nexium. Stopped Famotidine. Stopped Medrol Dosepak.    05/04/22 Terressa Koyanagi, DO - Patient presented for Medicare Annual Wellness Exam. No medication changes.    04/05/22 Nelwyn Salisbury, MD - Patient presented for Viral URI with cough and other concerns.  Recent consult visits: 04/20/22 Parke Poisson, MD - Patient presented for Coronary artery disease involving native coronary artery of native heart without angina pectoris and other concerns. No medication changes.    03/06/22 Drema Dallas, DO (Neurology) - Patient presented for Memory deficits and other concerns. No medication changes.   Hospital visits: None in previous 6 months   Objective:  Lab Results  Component Value Date   CREATININE 1.27 (H) 09/08/2021   BUN 23 09/08/2021   GFR 41.85 (L) 09/08/2021   EGFR 42 (L) 11/17/2020   GFRNONAA 28 (L) 10/09/2019   GFRAA 33 (L) 10/09/2019   NA 139 09/08/2021   K 4.0 09/08/2021   CALCIUM 9.7 09/08/2021   CO2 28 09/08/2021   GLUCOSE 86 09/08/2021    Lab Results  Component Value Date/Time   HGBA1C 5.5 09/08/2021 09:55 AM   HGBA1C 5.5 07/02/2017 04:56 PM   GFR 41.85 (L) 09/08/2021 09:55 AM   GFR 53.36 (L) 07/02/2017 04:56 PM    Last diabetic Eye exam: No results found for: "HMDIABEYEEXA"  Last diabetic Foot exam: No results found for: "HMDIABFOOTEX"   Lab Results  Component Value Date    CHOL 149 09/08/2021   HDL 84.50 09/08/2021   LDLCALC 39 09/08/2021   LDLDIRECT 147.7 12/29/2011   TRIG 128.0 09/08/2021   CHOLHDL 2 09/08/2021       Latest Ref Rng & Units 09/08/2021    9:55 AM 11/17/2020    8:26 AM 08/20/2018   11:37 AM  Hepatic Function  Total Protein 6.0 - 8.3 g/dL 7.4  6.4  6.7   Albumin 3.5 - 5.2 g/dL 4.7  4.5  4.0   AST 0 - 37 U/L 24  24  22    ALT 0 - 35 U/L 16  15  17    Alk Phosphatase 39 - 117 U/L 45  53  60   Total Bilirubin 0.2 - 1.2 mg/dL 0.4  0.2  0.6   Bilirubin, Direct 0.0 - 0.3 mg/dL 0.1       Lab Results  Component Value Date/Time   TSH 3.06 09/08/2021 09:55 AM   TSH 2.490 11/17/2020 08:26 AM       Latest Ref Rng & Units 09/08/2021    9:55 AM 11/17/2020    8:26 AM 10/09/2019   10:48 AM  CBC  WBC 4.0 - 10.5 K/uL 4.0  3.9  5.0   Hemoglobin 12.0 - 15.0 g/dL 40.9  81.1  91.4   Hematocrit 36.0 - 46.0 % 36.3  34.1  35.4   Platelets 150.0 - 400.0 K/uL 179.0  190  130     Lab Results  Component Value Date/Time  VD25OH 28 (L) 08/19/2013 09:57 AM   VD25OH 23 (L) 12/29/2011 09:57 AM   VITAMINB12 >1500 (H) 01/16/2022 10:34 AM   VITAMINB12 91 (L) 09/08/2021 09:55 AM    Clinical ASCVD: No  The 10-year ASCVD risk score (Arnett DK, et al., 2019) is: 18.2%   Values used to calculate the score:     Age: 50 years     Sex: Female     Is Non-Hispanic African American: No     Diabetic: No     Tobacco smoker: No     Systolic Blood Pressure: 128 mmHg     Is BP treated: Yes     HDL Cholesterol: 84.5 mg/dL     Total Cholesterol: 149 mg/dL       05/03/863   78:46 AM 04/05/2022    9:36 AM 01/16/2022   10:10 AM  Depression screen PHQ 2/9  Decreased Interest 2 2 1   Down, Depressed, Hopeless 2 0 0  PHQ - 2 Score 4 2 1   Altered sleeping 0 0 0  Tired, decreased energy 2 2 3   Change in appetite 0 0 0  Feeling bad or failure about yourself  0 1 0  Trouble concentrating 0 0 0  Moving slowly or fidgety/restless 0 0 1  Suicidal thoughts 0 0 0  PHQ-9  Score 6 5 5   Difficult doing work/chores Not difficult at all Somewhat difficult Somewhat difficult     Social History   Tobacco Use  Smoking Status Former   Years: 42   Types: Cigarettes   Quit date: 07/09/2008   Years since quitting: 14.0  Smokeless Tobacco Never  Tobacco Comments   9 years ago   BP Readings from Last 3 Encounters:  05/16/22 128/82  04/20/22 121/79  04/05/22 120/74   Pulse Readings from Last 3 Encounters:  05/16/22 80  04/20/22 73  04/05/22 68   Wt Readings from Last 3 Encounters:  05/16/22 163 lb (73.9 kg)  04/20/22 162 lb 6.4 oz (73.7 kg)  04/05/22 161 lb (73 kg)   BMI Readings from Last 3 Encounters:  05/16/22 27.12 kg/m  04/20/22 27.02 kg/m  04/05/22 26.79 kg/m    Allergies  Allergen Reactions   Ace Inhibitors     REACTION: cough   Codeine Nausea And Vomiting   Fentanyl Itching   Statins     REACTION: MYALGIAS-muscle pain    Medications Reviewed Today     Reviewed by Nelwyn Salisbury, MD (Physician) on 05/16/22 at 0935  Med List Status: <None>   Medication Order Taking? Sig Documenting Provider Last Dose Status Informant  aspirin EC 81 MG tablet [id number] Yes Take 1 tablet (81 mg total) by mouth daily. Swallow whole. Parke Poisson, MD Taking Active   buPROPion (WELLBUTRIN XL) 300 MG 24 hr tablet [id number] Yes TAKE 1 TABLET BY MOUTH EVERY DAY Nelwyn Salisbury, MD Taking Active   Cholecalciferol (VITAMIN D3) 25 MCG (1000 UT) CAPS [id number] Yes Take 5,000 Units by mouth daily.  [provider] Taking Active Self  Evolocumab (REPATHA SURECLICK) 140 MG/ML Ivory Broad [id number] Yes Inject 140 mg into the skin every 14 (fourteen) days. Parke Poisson, MD Taking Active   ezetimibe (ZETIA) 10 MG tablet [id number] Yes TAKE 1 TABLET BY MOUTH EVERY DAY Nelwyn Salisbury, MD Taking Active   famotidine (PEPCID) 20 MG tablet [id number] Yes Take 1 tablet (20 mg total) by mouth daily.  Patient taking differently: Take 40 mg by mouth daily. Taking 40  mg  Nelwyn Salisbury, MD Taking Active   fenofibrate 160 MG tablet [id number] Yes TAKE 1 TABLET BY MOUTH EVERY DAY Nelwyn Salisbury, MD Taking Active   metoprolol succinate (TOPROL-XL) 25 MG 24 hr tablet [id number] Yes TAKE 1 TABLET (25 MG TOTAL) BY MOUTH DAILY. Nelwyn Salisbury, MD Taking Active   MYRBETRIQ 50 MG TB24 tablet [id number] Yes Take 50 mg by mouth daily. [provider] Taking Active Self  sertraline (ZOLOFT) 100 MG tablet [id number] Yes Take 2 tablets (200 mg total) by mouth daily. Nelwyn Salisbury, MD Taking Active   valsartan (DIOVAN) 80 MG tablet [id number] Yes TAKE 1 TABLET BY MOUTH EVERY DAY Nelwyn Salisbury, MD Taking Active             SDOH:  (Social Determinants of Health) assessments and interventions performed: Yes   Medication Assistance: {MEDASSISTANCEINFO:25044}  Medication Access: Within the past 30 days, how often has patient missed a dose of medication? *** Is a pillbox or other method used to improve adherence? {YES/NO:21197} Factors that may affect medication adherence? {CHL DESC; BARRIERS:21522} Are meds synced by current pharmacy? {YES/NO:21197} Are meds delivered by current pharmacy? {YES/NO:21197} Does patient experience delays in picking up medications due to transportation concerns? {YES/NO:21197}  [facility] Reviewed: Is patient disadvantaged to use [facility]?: No  Current Rx insurance plan: [employer] Name and location of Current pharmacy:  [id number] [place], [place] - [address] [place] [address] Phone: [phone] Fax: [phone]  [facility] services reviewed with patient today?: {YES/NO:21197} Patient requests to transfer care to [facility]?: {YES/NO:21197} Reason patient declined to change pharmacies: {US patient preference:27474}  Compliance/Adherence/Medication fill history: Care Gaps: PNA Vaccine - Overdue Zoster Vaccine - Overdue COVID Booster - Postponed AWV -  05/04/22  Star-Rating Drugs: Valsartan 80 mg - [facility]    Assessment/Plan Hypertension (BP goal <130/80) -{US controlled/uncontrolled:25276} -Current treatment: Valsartan 80mg  1 qd Metoprolol XL 25mg  1 qd -Medications previously tried: Losartan,  ACEI (cough) -Current home readings: *** -Current dietary habits: *** -Current exercise habits: *** -{ACTIONS;DENIES/REPORTS:21021675::"Denies"} hypotensive/hypertensive symptoms -Educated on {CCM BP Counseling:25124} -Counseled to monitor BP at home ***, document, and provide log at future appointments -{CCMPHARMDINTERVENTION:25122}  Hyperlipidemia: (LDL goal < 100) -{US controlled/uncontrolled:25276} -Current treatment: Repatha q 2 weeks Zetia 10mg  Fenofibrate 160mg  1 qd -Medications previously tried: statins (myalgia)  -Current dietary patterns: *** -Current exercise habits: *** -Educated on {CCM HLD Counseling:25126} -{CCMPHARMDINTERVENTION:25122}  Depression/Anxiety (Goal: Well-controlled mood that still allows for ADLs) -{US controlled/uncontrolled:25276} -Current treatment: Bupropion XL 300mg  1 qd Sertraline 100mg  1 qd -Medications previously tried/failed: None -PHQ9: *** -GAD7: *** -Connected with *** for mental health support -Educated on {CCM mental health counseling:25127} -{CCMPHARMDINTERVENTION:25122}  GERD (Goal: minimize symptoms of reflux ) -{US controlled/uncontrolled:25276} -Current treatment  Nexium 20mg  1 BID before a meal -Medications previously tried: Prevacid -Triggering factors: *** -Hx of Bleeds/ulcers: {yes/no:20286} -Counseled on small meals, elevating head, and sleeping on left side -{CCMPHARMDINTERVENTION:25122}  Urinary Incontinence (Goal: Reduce urinary frequency and urgency) -Not assessed today -Current treatment  Myrbetriq 50mg  1 qd  OTC  -Current treatment  Aspirin 81mg  EC 1 qd Vit D3 1000 units 1 qd   Sherrill Raring Clinical  Pharmacist [phone]

## 2022-08-01 DIAGNOSIS — H0015 Chalazion left lower eyelid: Secondary | ICD-10-CM | POA: Diagnosis not present

## 2022-08-01 DIAGNOSIS — H02055 Trichiasis without entropian left lower eyelid: Secondary | ICD-10-CM | POA: Diagnosis not present

## 2022-08-04 ENCOUNTER — Encounter: Payer: MEDICARE | Primary: Family Medicine

## 2022-08-04 NOTE — Progress Notes (Signed)
[facility]  [address] [place] [address]  Phone: [phone]  Fax: [phone]  Outpatient Occupational Therapy Treatment Episodes    NOVARAE MCFERRAN   (77 y.o. female)  DOB 1946-01-28    Medical & Insurance Info OT Plan of Care & Visit info   Referring Physician: Wyvonna Plum., MD Certification Period Expiration Date: 10/18/22     Onset Date: 07/26/22     Plan Frequency: 2x/week    Plan Weeks: 12     Primary Insurance Medicare Part A And B  Secondary Insurance [employer] MEDICARE SUPP OT Visit Info  Total # of Visits to Date: 4         Medical Diagnosis: Presence of right artificial shoulder joint [Z96.611]  Unspecified rotator cuff tear or rupture of right shoulder, not specified as traumatic [M75.101]    Treatment Diagnosis: right rotator cuff syndrome.      Past Medical History/Comorbidities  Ms. 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), Adhesive capsulitis of shoulder, Asthma, Chondromalacia of patella, right, Chronic back pain, Complex tear of medial meniscus of right knee, subsequent encounter, Complex tear of medial meniscus, current injury, right knee, subsequent encounter, Drug effect, Essential hypertension, Fibromyalgia, GERD (gastroesophageal reflux disease), Hepatic steatosis, High cholesterol, Lumbar radiculopathy, Lung cancer (HCC), Malignant neoplasm of skin, Obesity, Osteoarthritis, Osteopenia, Patellofemoral syndrome of right knee, Restrictive lung disease, Rotator cuff tear, Sciatica, and Spasmodic dysphonia.  Ms. Gramajo  has a past surgical history that includes lumbar fusion (2001); Lumbar spine surgery (2003); Wrist surgery (Bilateral); thoracotomy (1993); Lung lobectomy (1980); Wrist surgery (2007); Wrist surgery (2005); shoulder surgery (Right, 1996); shoulder surgery (Right, 2011); Elbow surgery (Right); Total shoulder arthroplasty (Right, 06/2012); Cholecystectomy; Breast reduction surgery; knee  surgery (11/26/2018); Tubal ligation (1980); Submandibular gland excision; Skin cancer excision; joint replacement; eye surgery; fracture surgery; Knee Arthroplasty; and Upper gastrointestinal endoscopy (03-2021).  Allergies Levaquin [levofloxacin], Dilaudid [hydromorphone], Flurbiprofen, Nsaids, and Ezetimibe    Learning   Learning  Does the patient/guardian have any barriers to learning?: No barriers  Will there be a co-learner?: No  What is the preferred language of the patient/guardian?: English  Is an interpreter required?: No  How does the patient/guardian prefer to learn new concepts?: Demonstration; Pictures/Videos      Restrictions/Precautions  Restrictions/Precautions  Restrictions/Precautions: Other (comment)  Required Braces or Orthoses?: No      Position Activity Restriction  Other position/activity restrictions: no strenghening past 45 degree angle of body      SUBJECTIVE   Subjective  Subjective: Pt reports she had to do a lot of moping yesterday and arm is more sore. Pt reports overall arm is doing better.  Pain   Pain Screening  Patient Currently in Pain: Yes  Pain Assessment: 0-10  Pain Level: 4  Post Treatment Pain Level: 4  Pain Location: Shoulder  Pain Orientation: Right  Pain Descriptors: Discomfort, Aching      OBJECTIVE   OT Treatment Completed:  Exercises: Therapeutic exercise (CPT 97110)   Treatment Reasoning    OT Exercise 1: light [place] exs  OT Exercise 2: pendulums  OT Exercise 5: massage    Limitations addressed: Mobility, Flexibility, Strength  Therapist provided: Assistance  Functional ability(s) targeted: Reaching overhead, Performing self care actvities, Return to work tasks, Tolerance to age appropriate activities   1:1 Time (minutes): 20  Unbillable time (minutes): 0  Total Time: 20  Ultrasound: (CPT [phone]) Treatment Reasoning    Patient Position: Seated  Ultrasound location: Shoulder, Right  Ultrasound specified location: right upper arm/shoulder  Ultrasound frequency:  1 MHz  Ultrasound intensity (W/cm2): 2  Ultrasound mode: Continuous  Post treatment skin assessment: Intact Limitations addressed: Pain modulation  Therapist provided: Assistance  Functional ability(s) targeted: Reaching overhead, Performing self care activities, Tolerance to age appropriate activities   1:1 Time (minutes): 8  Unbillable time (minutes): 0  Total Time: 8        ASSESSMENT   Assessment  Assessment: Ultrasound seems beneficial for decreasing discomfort. MInimal cuing required to correctly perform Rock[place] exs.  OT Education  OT Education  OT Education: Home Exercise Program  Patient Education: gentle [place] exs  Barriers impacting rehab : None  PLAN   Interventions Planned (Treatment may consist of any combination of the following):    Current Treatment Recommendations: Strengthening; ROM; Pain management; Modalities    Next Visit Recommendation   Specific Instructions for Next Treatment: gentle strengthening, modalities, massage  Goals   Patient Goal(s): Patient goals : Strengthen arm - help with fine motor movements  Short Term Goals Completed by 6 weeks Goal Status   Instruction in gentle strengthening exs to shoulder STG Goal 1 Status:: In progress   Use of modalities as needed to decease discomfort STG Goal 2 Status:: In progress   Instruction in gentle AROM exs to shoulder STG Goal 3 Status:: In progress   Massage to shoulder area to ecrease discomfort STG Goal 4 Status:: New, In progress   Provide with written/illustrated HEPs STG Goal 5 Status:: In progress                 Long Term Goals Completed by 12 weeks Goal Status   Indepenent HEP LTG Goal 1 Status:: In progress   Decraese discomfort to occassional LTG Goal 2 Status:: In progress                                   Total Duration   OT Individual Minutes  Time In: 0730  Time Out: 0805  Minutes: 35    Therapist Signature: Colin Broach, OTR/L, CHT    Date: 08/04/2022

## 2022-08-07 ENCOUNTER — Inpatient Hospital Stay: Admit: 2022-08-07 | Payer: MEDICARE | Primary: Family Medicine

## 2022-08-08 NOTE — Progress Notes (Unsigned)
Care Management & Coordination Services Pharmacy Note  08/09/2022 Name:  Maria Schneider MRN:  [id number] DOB:  [birth year]  Summary: BP at goal <130/80 LDL at goal <70 Due for DEXA Scan  Recommendations/Changes made from today's visit: -Counseled to be mindful of salt intake and importance of home BP monitoring -Counseled on importance of adherence to medications and to set a reminder alert for her repatha dose -ORDER updated DEXA scan, with PCP approval  Follow up plan: General review in 2 and 6 months Pharmacist follow up in 7-8 months   Subjective: Maria Schneider is an 77 y.o. year old female who is a primary patient of Nelwyn Salisbury, MD.  The care coordination team was consulted for assistance with disease management and care coordination needs.    Engaged with patient face to face for initial visit. Presents in office with all of her medications. Lives with her husband who is a retired Warden/ranger. She likes to stay home and read, goes to church weekly. Her son and grandkids have moved to [place], [place] in the last year, so she tries to fly down there as much as she can to see them.  Recent office visits: 05/16/25 Nelwyn Salisbury, MD - Patient presented for Hoarseness. Prescribed Nexium. Stopped Famotidine. Stopped Medrol Dosepak.    05/04/22 Terressa Koyanagi, DO - Patient presented for Medicare Annual Wellness Exam. No medication changes.    04/05/22 Nelwyn Salisbury, MD - Patient presented for Viral URI with cough and other concerns.  Recent consult visits: 04/20/22 Parke Poisson, MD - Patient presented for Coronary artery disease involving native coronary artery of native heart without angina pectoris and other concerns. No medication changes.    03/06/22 Drema Dallas, DO (Neurology) - Patient presented for Memory deficits and other concerns. No medication changes.   Hospital visits: None in previous 6 months   Objective:  Lab Results  Component Value Date   CREATININE  1.27 (H) 09/08/2021   BUN 23 09/08/2021   GFR 41.85 (L) 09/08/2021   EGFR 42 (L) 11/17/2020   GFRNONAA 28 (L) 10/09/2019   GFRAA 33 (L) 10/09/2019   NA 139 09/08/2021   K 4.0 09/08/2021   CALCIUM 9.7 09/08/2021   CO2 28 09/08/2021   GLUCOSE 86 09/08/2021    Lab Results  Component Value Date/Time   HGBA1C 5.5 09/08/2021 09:55 AM   HGBA1C 5.5 07/02/2017 04:56 PM   GFR 41.85 (L) 09/08/2021 09:55 AM   GFR 53.36 (L) 07/02/2017 04:56 PM    Last diabetic Eye exam: No results found for: "HMDIABEYEEXA"  Last diabetic Foot exam: No results found for: "HMDIABFOOTEX"   Lab Results  Component Value Date   CHOL 149 09/08/2021   HDL 84.50 09/08/2021   LDLCALC 39 09/08/2021   LDLDIRECT 147.7 12/29/2011   TRIG 128.0 09/08/2021   CHOLHDL 2 09/08/2021       Latest Ref Rng & Units 09/08/2021    9:55 AM 11/17/2020    8:26 AM 08/20/2018   11:37 AM  Hepatic Function  Total Protein 6.0 - 8.3 g/dL 7.4  6.4  6.7   Albumin 3.5 - 5.2 g/dL 4.7  4.5  4.0   AST 0 - 37 U/L 24  24  22    ALT 0 - 35 U/L 16  15  17    Alk Phosphatase 39 - 117 U/L 45  53  60   Total Bilirubin 0.2 - 1.2 mg/dL 0.4  0.2  0.6   Bilirubin,  Direct 0.0 - 0.3 mg/dL 0.1       Lab Results  Component Value Date/Time   TSH 3.06 09/08/2021 09:55 AM   TSH 2.490 11/17/2020 08:26 AM       Latest Ref Rng & Units 09/08/2021    9:55 AM 11/17/2020    8:26 AM 10/09/2019   10:48 AM  CBC  WBC 4.0 - 10.5 K/uL 4.0  3.9  5.0   Hemoglobin 12.0 - 15.0 g/dL 06.3  01.6  01.0   Hematocrit 36.0 - 46.0 % 36.3  34.1  35.4   Platelets 150.0 - 400.0 K/uL 179.0  190  130     Lab Results  Component Value Date/Time   VD25OH 28 (L) 08/19/2013 09:57 AM   VD25OH 23 (L) 12/29/2011 09:57 AM   VITAMINB12 >1500 (H) 01/16/2022 10:34 AM   VITAMINB12 91 (L) 09/08/2021 09:55 AM    Clinical ASCVD: No  The 10-year ASCVD risk score (Arnett DK, et al., 2019) is: 18.2%   Values used to calculate the score:     Age: 77 years     Sex: Female     Is  Non-Hispanic African American: No     Diabetic: No     Tobacco smoker: No     Systolic Blood Pressure: 128 mmHg     Is BP treated: Yes     HDL Cholesterol: 84.5 mg/dL     Total Cholesterol: 149 mg/dL       9/32/3557   32:20 AM 04/05/2022    9:36 AM 01/16/2022   10:10 AM  Depression screen PHQ 2/9  Decreased Interest 2 2 1   Down, Depressed, Hopeless 2 0 0  PHQ - 2 Score 4 2 1   Altered sleeping 0 0 0  Tired, decreased energy 2 2 3   Change in appetite 0 0 0  Feeling bad or failure about yourself  0 1 0  Trouble concentrating 0 0 0  Moving slowly or fidgety/restless 0 0 1  Suicidal thoughts 0 0 0  PHQ-9 Score 6 5 5   Difficult doing work/chores Not difficult at all Somewhat difficult Somewhat difficult     Social History   Tobacco Use  Smoking Status Former   Years: 42   Types: Cigarettes   Quit date: 07/09/2008   Years since quitting: 14.0  Smokeless Tobacco Never  Tobacco Comments   9 years ago   BP Readings from Last 3 Encounters:  05/16/22 128/82  04/20/22 121/79  04/05/22 120/74   Pulse Readings from Last 3 Encounters:  05/16/22 80  04/20/22 73  04/05/22 68   Wt Readings from Last 3 Encounters:  05/16/22 163 lb (73.9 kg)  04/20/22 162 lb 6.4 oz (73.7 kg)  04/05/22 161 lb (73 kg)   BMI Readings from Last 3 Encounters:  05/16/22 27.12 kg/m  04/20/22 27.02 kg/m  04/05/22 26.79 kg/m    Allergies  Allergen Reactions   Ace Inhibitors     REACTION: cough   Codeine Nausea And Vomiting   Fentanyl Itching   Statins     REACTION: MYALGIAS-muscle pain    Medications Reviewed Today     Reviewed by Sherrill Raring, RPH (Pharmacist) on 08/09/22 at 2123  Med List Status: <None>   Medication Order Taking? Sig Documenting Provider Last Dose Status Informant  aspirin EC 81 MG tablet [id number] Yes Take 1 tablet (81 mg total) by mouth daily. Swallow whole. Parke Poisson, MD Taking Active   buPROPion (WELLBUTRIN XL) 300 MG 24 hr  tablet [id number] Yes TAKE  1 TABLET BY MOUTH EVERY DAY Nelwyn Salisbury, MD Taking Active   Cholecalciferol (VITAMIN D3) 25 MCG (1000 UT) CAPS [id number] Yes Take 5,000 Units by mouth daily.  [provider] Taking Active Self  esomeprazole (NEXIUM) 20 MG capsule [id number] Yes Take 1 capsule (20 mg total) by mouth 2 (two) times daily before a meal. Nelwyn Salisbury, MD Taking Active            Med Note Carmelia Roller Aug 09, 2022  9:23 PM) Taking one daily  Evolocumab [facility] SURECLICK) 140 MG/ML [place] [id number] Yes Inject 140 mg into the skin every 14 (fourteen) days. Parke Poisson, MD Taking Active   ezetimibe (ZETIA) 10 MG tablet [id number] Yes TAKE 1 TABLET BY MOUTH EVERY DAY Nelwyn Salisbury, MD Taking Active   fenofibrate 160 MG tablet [id number] Yes TAKE 1 TABLET BY MOUTH EVERY DAY Nelwyn Salisbury, MD Taking Active   metoprolol succinate (TOPROL-XL) 25 MG 24 hr tablet [id number] Yes TAKE 1 TABLET (25 MG TOTAL) BY MOUTH DAILY. Nelwyn Salisbury, MD Taking Active   MYRBETRIQ 50 MG TB24 tablet [id number] Yes Take 50 mg by mouth daily. [provider] Taking Active Self  sertraline (ZOLOFT) 100 MG tablet [id number] Yes TAKE 2 TABLETS BY MOUTH EVERY DAY Nelwyn Salisbury, MD Taking Active   valsartan (DIOVAN) 80 MG tablet [id number] Yes TAKE 1 TABLET BY MOUTH EVERY DAY Nelwyn Salisbury, MD Taking Active             SDOH:  (Social Determinants of Health) assessments and interventions performed: Yes SDOH Interventions    Flowsheet Row Care Coordination from 08/09/2022 in [facility]  SDOH Interventions   Food Insecurity Interventions Intervention Not Indicated  Housing Interventions Intervention Not Indicated       Medication Assistance: None required.  Patient affirms current coverage meets needs.  Medication Access: Within the past 30 days, how often has patient missed a dose of medication? Repatha, forgets since it is only once every 2 weeks Is a pillbox or other method used to  improve adherence? No  Factors that may affect medication adherence?  forgetfullness Are meds synced by current pharmacy? No  Are meds delivered by current pharmacy? No  Does patient experience delays in picking up medications due to transportation concerns? No   [facility] Reviewed: Is patient disadvantaged to use [facility]?: No  Current Rx insurance plan: [employer] Name and location of Current pharmacy:  [id number] [place], [place] - [address] [place] [address] Phone: [phone] Fax: [phone]   Compliance/Adherence/Medication fill history: Care Gaps: PNA Vaccine - Overdue Zoster Vaccine - Overdue COVID Booster - Postponed AWV - 05/04/22  Star-Rating Drugs: Valsartan 80 mg - [facility]    Assessment/Plan Hypertension (BP goal <130/80) -Controlled -Current treatment: Valsartan 80mg  1 qd Appropriate, Effective, Safe, Accessible Metoprolol XL 25mg  1 qd Appropriate, Effective, Safe, Accessible -Medications previously tried: Losartan,  ACEI (cough) -Current home readings: Has a machine at home, checks once monthly -Current dietary habits: mindful of salt intake -Current exercise habits: goes on walks with her husband, seeking membership at fitness center -Denies hypotensive/hypertensive symptoms -Educated on BP goals and benefits of medications for prevention of heart attack, stroke and kidney damage; Exercise goal of 150 minutes per week; Importance of home blood pressure monitoring; Proper BP monitoring technique; -Counseled to monitor BP at home every 1-2  weeks, document, and provide log at future appointments -Recommended to continue current medication  Hyperlipidemia: (LDL goal < 100) -Controlled -Current treatment: Repatha q 2 weeks Appropriate, Effective, Safe, Accessible Zetia 10mg  Appropriate, Effective, Safe, Accessible Fenofibrate 160mg  1 qd Appropriate, Effective, Safe,  Accessible -Medications previously tried: statins (myalgia)  -Current dietary patterns: see above -Current exercise habits: see above -Educated on Benefits of statin for ASCVD risk reduction; Importance of limiting foods high in cholesterol; -Recommended to continue current medication Recommended setting a calendar date for repatha with reminder alert to remember dose  Depression/Anxiety (Goal: Well-controlled mood that still allows for ADLs) -Not assessed today -Current treatment: Bupropion XL 300mg  1 qd Appropriate, Effective, Safe, Accessible Sertraline 100mg  1 qd Appropriate, Effective, Safe, Accessible  GERD (Goal: minimize symptoms of reflux ) -Not assessed today -Current treatment  Nexium 20mg  1 qd before a meal Appropriate, Effective, Safe, Accessible -Medications previously tried: Prevacid -Patient has tried a nexium taper with pepcid add on in the past unsuccessfully  Urinary Incontinence (Goal: Reduce urinary frequency and urgency) -Not assessed today -Current treatment  Myrbetriq 50mg  1 qd Appropriate, Effective, Safe, Accessible  OTC  -Current treatment  Aspirin 81mg  EC 1 qd Appropriate, Effective, Safe, Accessible Vit D3 2000 units 1 qd Appropriate, Effective, Safe, Accessible   Sherrill Raring Clinical Pharmacist [phone]

## 2022-08-09 ENCOUNTER — Ambulatory Visit: Payer: Medicare PPO

## 2022-08-09 DIAGNOSIS — M810 Age-related osteoporosis without current pathological fracture: Secondary | ICD-10-CM

## 2022-08-10 ENCOUNTER — Encounter: Payer: MEDICARE | Primary: Family Medicine

## 2022-08-10 NOTE — Telephone Encounter (Signed)
Done

## 2022-08-16 ENCOUNTER — Inpatient Hospital Stay: Admit: 2022-08-16 | Payer: MEDICARE | Primary: Family Medicine

## 2022-08-16 NOTE — Progress Notes (Signed)
[facility]  [address] [place] [address]  Phone: [phone]  Fax: [phone]  Outpatient Occupational Therapy Treatment Episodes    Lorraine Freeman   (77 y.o. female)  DOB 1946-02-23    Medical & Insurance Info OT Plan of Care & Visit info   Referring Physician: Wyvonna Plum., MD Certification Period Expiration Date: 10/18/22     Onset Date: 07/26/22     Plan Frequency: 2x/week    Plan Weeks: 12     Primary Insurance Medicare Part A [place] B  [place] OT Visit Info  Total # of Visits to Date: 6     Progress Note Counter: 1   Medical Diagnosis: No admission diagnoses are documented for this encounter.    Treatment Diagnosis: right rotator cuff syndrome.      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), Adhesive capsulitis of shoulder, Asthma, Chondromalacia of patella, right, Chronic back pain, Complex tear of medial meniscus of right knee, subsequent encounter, Complex tear of medial meniscus, current injury, right knee, subsequent encounter, Drug effect, Essential hypertension, Fibromyalgia, GERD (gastroesophageal reflux disease), Hepatic steatosis, High cholesterol, Lumbar radiculopathy, Lung cancer (HCC), Malignant neoplasm of skin, Obesity, Osteoarthritis, Osteopenia, Patellofemoral syndrome of right knee, Restrictive lung disease, Rotator cuff tear, Sciatica, and Spasmodic dysphonia.  Lorraine Freeman  has a past surgical history that includes lumbar fusion (2001); Lumbar spine surgery (2003); Wrist surgery (Bilateral); thoracotomy (1993); Lung lobectomy (1980); Wrist surgery (2007); Wrist surgery (2005); shoulder surgery (Right, 1996); shoulder surgery (Right, 2011); Elbow surgery (Right); Total shoulder arthroplasty (Right, 06/2012); Cholecystectomy; Breast reduction surgery; knee surgery (11/26/2018); Tubal ligation (1980); Submandibular gland excision; Skin cancer  excision; joint replacement; eye surgery; fracture surgery; Knee Arthroplasty; and Upper gastrointestinal endoscopy (03-2021).  Allergies Levaquin [levofloxacin], Dilaudid [hydromorphone], Flurbiprofen, Nsaids, and Ezetimibe    Learning   Learning  Does the patient/guardian have any barriers to learning?: No barriers  Will there be a co-learner?: No  What is the preferred language of the patient/guardian?: English  Is an interpreter required?: No  How does the patient/guardian prefer to learn new concepts?: Demonstration; Pictures/Videos      Restrictions/Precautions  Restrictions/Precautions  Restrictions/Precautions: Other (comment)  Required Braces or Orthoses?: No      Position Activity Restriction  Other position/activity restrictions: no strenghening past 45 degree angle of body      SUBJECTIVE   Subjective  Subjective: Pt continues to report discomfort in her shoulder.  Pain   Pain Screening  Patient Currently in Pain: Yes  Pain Assessment: 0-10  Pain Level: 4  Post Treatment Pain Level: 4  Pain Location: Shoulder  Pain Orientation: Right  Pain Descriptors: Aching, Discomfort, Tender, Sore      OBJECTIVE   OT Treatment Completed:  Exercises: Therapeutic exercise (CPT 97110)   Treatment Reasoning    OT Exercise 1: light [place] exs  OT Exercise 2: pendulums  OT Exercise 3: table slides  OT Exercise 5: massage    Limitations addressed: Mobility, Flexibility, Strength  Functional ability(s) targeted: Reaching overhead, Performing self care actvities, Return to work tasks, Tolerance to age appropriate activities   1:1 Time (minutes): 20  Unbillable time (minutes): 0  Total Time: 20           Ultrasound: (CPT [id number]) Treatment Reasoning    Patient Position: Seated  Ultrasound location: Shoulder, Right  Ultrasound specified location: right upper  arm/shoulder  Ultrasound frequency: 1 MHz  Ultrasound intensity (W/cm2): 2  Ultrasound mode: Continuous  Post treatment skin assessment: Intact Limitations addressed: Pain  modulation  Therapist provided: Assistance  Functional ability(s) targeted: Reaching overhead, Performing self care activities, Tolerance to age appropriate activities   1:1 Time (minutes): 8  Unbillable time (minutes): 0  Total Time: 8        ASSESSMENT   Assessment  Assessment: Ultrasound seems to give some relief. Reviewed good body mechanics for shoulder with no downward pressure thru arm, no reaching over shoulder height to decrease discomfort.  OT Education  OT Education  OT Education: Home Exercise Program  Patient Education: gentle [place] exs  Barriers impacting rehab : None  PLAN   Interventions Planned (Treatment may consist of any combination of the following):    Current Treatment Recommendations: Strengthening; ROM; Pain management; Modalities    Next Visit Recommendation   Specific Instructions for Next Treatment: gentle strengthening, modalities, massage  Goals   Patient Goal(s): Patient goals : Strengthen arm - help with fine motor movements  Short Term Goals Completed by 6 weeks Goal Status   Instruction in gentle strengthening exs to shoulder STG Goal 1 Status:: In progress   Use of modalities as needed to decease discomfort STG Goal 2 Status:: In progress   Instruction in gentle AROM exs to shoulder STG Goal 3 Status:: In progress   Massage to shoulder area to ecrease discomfort STG Goal 4 Status:: New, In progress   Provide with written/illustrated HEPs STG Goal 5 Status:: In progress                 Long Term Goals Completed by 12 weeks Goal Status   Indepenent HEP LTG Goal 1 Status:: In progress   Decraese discomfort to occassional LTG Goal 2 Status:: In progress                                   Total Duration   OT Individual Minutes  Time In: 1130  Time Out: 1210  Minutes: 40    Therapist Signature: Colin Broach, OTR/L, CHT    Date: 08/18/2022

## 2022-08-18 ENCOUNTER — Inpatient Hospital Stay: Admit: 2022-08-18 | Payer: MEDICARE | Primary: Family Medicine

## 2022-08-18 NOTE — Progress Notes (Signed)
[facility]  [address] [place] [address]  Phone: [phone]  Fax: [phone]  Outpatient Occupational Therapy Treatment Episodes    Lorraine Freeman   (77 y.o. female)  DOB Mar 13, 1946    Medical & Insurance Info OT Plan of Care & Visit info   Referring Physician: Wyvonna Plum., MD Certification Period Expiration Date: 10/18/22     Onset Date: 07/26/22     Plan Frequency: 2x/week    Plan Weeks: 12     Primary Insurance Medicare Part A And B  [place] [employer] [place] OT Visit Info  Total # of Visits to Date: 7     Progress Note Counter: 1   Medical Diagnosis: No admission diagnoses are documented for this encounter.    Treatment Diagnosis: right rotator cuff syndrome.      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), Adhesive capsulitis of shoulder, Asthma, Chondromalacia of patella, right, Chronic back pain, Complex tear of medial meniscus of right knee, subsequent encounter, Complex tear of medial meniscus, current injury, right knee, subsequent encounter, Drug effect, Essential hypertension, Fibromyalgia, GERD (gastroesophageal reflux disease), Hepatic steatosis, High cholesterol, Lumbar radiculopathy, Lung cancer (HCC), Malignant neoplasm of skin, Obesity, Osteoarthritis, Osteopenia, Patellofemoral syndrome of right knee, Restrictive lung disease, Rotator cuff tear, Sciatica, and Spasmodic dysphonia.  Lorraine Freeman  has a past surgical history that includes lumbar fusion (2001); Lumbar spine surgery (2003); Wrist surgery (Bilateral); thoracotomy (1993); Lung lobectomy (1980); Wrist surgery (2007); Wrist surgery (2005); shoulder surgery (Right, 1996); shoulder surgery (Right, 2011); Elbow surgery (Right); Total shoulder arthroplasty (Right, 06/2012); Cholecystectomy; Breast reduction surgery; knee surgery (11/26/2018); Tubal ligation (1980); Submandibular gland excision; Skin cancer  excision; joint replacement; eye surgery; fracture surgery; Knee Arthroplasty; and Upper gastrointestinal endoscopy (03-2021).  Allergies Levaquin [levofloxacin], Dilaudid [hydromorphone], Flurbiprofen, Nsaids, and Ezetimibe    Learning   Learning  Does the patient/guardian have any barriers to learning?: No barriers  Will there be a co-learner?: No  What is the preferred language of the patient/guardian?: English  Is an interpreter required?: No  How does the patient/guardian prefer to learn new concepts?: Demonstration; Pictures/Videos      Restrictions/Precautions  Restrictions/Precautions  Restrictions/Precautions: Other (comment)  Required Braces or Orthoses?: No      Position Activity Restriction  Other position/activity restrictions: no strenghening past 45 degree angle of body      SUBJECTIVE   Subjective  Subjective: Pt reports shoulder still hurts.  Pain   Pain Screening  Patient Currently in Pain: Yes  Pain Assessment: 0-10  Pain Level: 4  Post Treatment Pain Level: 4  Pain Location: Shoulder  Pain Orientation: Right  Pain Descriptors: Aching, Discomfort, Sore      OBJECTIVE   OT Treatment Completed:  Exercises: Therapeutic exercise (CPT 97110)   Treatment Reasoning    OT Exercise 1: light [place] exs  OT Exercise 2: pendulums  OT Exercise 3: table slides  OT Exercise 4: gentle passive ROM while supine  OT Exercise 5: massage    Limitations addressed: Mobility, Flexibility, Strength  Functional ability(s) targeted: Reaching overhead, Performing self care actvities, Return to work tasks, Tolerance to age appropriate activities   1:1 Time (minutes): 15  Unbillable time (minutes): 0  Total Time: 15           Ultrasound: (CPT [phone]) Treatment Reasoning    Patient Position: Seated  Ultrasound location: Shoulder, Right  Ultrasound specified location: right upper arm/shoulder  Ultrasound frequency: 1 MHz  Ultrasound intensity (W/cm2): 2  Ultrasound mode: Continuous  Post treatment skin assessment: Intact  Limitations addressed: Pain modulation  Therapist provided: Assistance  Functional ability(s) targeted: Reaching overhead, Performing self care activities, Tolerance to age appropriate activities   1:1 Time (minutes): 8  Unbillable time (minutes): 0  Total Time: 8        ASSESSMENT   Assessment  Assessment: Pt able to correctly perfrom [place] exs with minimal cuing.  OT Education  OT Education  OT Education: Home Exercise Program  Patient Education: gentle [place] exs  Barriers impacting rehab : None  PLAN   Interventions Planned (Treatment may consist of any combination of the following):    Current Treatment Recommendations: Strengthening; ROM; Pain management; Modalities    Next Visit Recommendation   Specific Instructions for Next Treatment: gentle strengthening, modalities, massage  Goals   Patient Goal(s): Patient goals : Strengthen arm - help with fine motor movements  Short Term Goals Completed by 6 weeks Goal Status   Instruction in gentle strengthening exs to shoulder STG Goal 1 Status:: In progress   Use of modalities as needed to decease discomfort STG Goal 2 Status:: In progress   Instruction in gentle AROM exs to shoulder STG Goal 3 Status:: In progress   Massage to shoulder area to ecrease discomfort STG Goal 4 Status:: New, In progress   Provide with written/illustrated HEPs STG Goal 5 Status:: In progress                 Long Term Goals Completed by 12 weeks Goal Status   Indepenent HEP LTG Goal 1 Status:: In progress   Decraese discomfort to occassional LTG Goal 2 Status:: In progress                                   Total Duration   OT Individual Minutes  Time In: 0915  Time Out: 0955  Minutes: 40    Therapist Signature: Colin Broach, OTR/L, CHT    Date: 08/18/2022

## 2022-08-21 ENCOUNTER — Encounter: Payer: MEDICARE | Primary: Family Medicine

## 2022-08-21 DIAGNOSIS — M75101 Unspecified rotator cuff tear or rupture of right shoulder, not specified as traumatic: Secondary | ICD-10-CM

## 2022-08-21 NOTE — Progress Notes (Signed)
[facility]  [address] [place] [address]  Phone: [phone]  Fax: [phone]  Outpatient Occupational Therapy Treatment Episodes    Lorraine Freeman   (77 y.o. female)  DOB [birth year]    Medical & Insurance Info OT Plan of Care & Visit info   Referring Physician: Wyvonna Plum., MD Certification Period Expiration Date: 10/18/22     Onset Date: 07/26/22     Plan Frequency: 2x/week    Plan Weeks: 12     Primary Insurance Medicare Part A And B  Secondary [place] OT Visit Info  Total # of Visits to Date: 8     Progress Note Counter: 1   Medical Diagnosis: Unspecified rotator cuff tear or rupture of right shoulder, not specified as traumatic [M75.101]  Presence of right artificial shoulder joint [Z96.611]    Treatment Diagnosis: right rotator cuff syndrome.      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), Adhesive capsulitis of shoulder, Asthma, Chondromalacia of patella, right, Chronic back pain, Complex tear of medial meniscus of right knee, subsequent encounter, Complex tear of medial meniscus, current injury, right knee, subsequent encounter, Drug effect, Essential hypertension, Fibromyalgia, GERD (gastroesophageal reflux disease), Hepatic steatosis, High cholesterol, Lumbar radiculopathy, Lung cancer (HCC), Malignant neoplasm of skin, Obesity, Osteoarthritis, Osteopenia, Patellofemoral syndrome of right knee, Restrictive lung disease, Rotator cuff tear, Sciatica, and Spasmodic dysphonia.  Lorraine Freeman  has a past surgical history that includes lumbar fusion (2001); Lumbar spine surgery (2003); Wrist surgery (Bilateral); thoracotomy (1993); Lung lobectomy (1980); Wrist surgery (2007); Wrist surgery (2005); shoulder surgery (Right, 1996); shoulder surgery (Right, 2011); Elbow surgery (Right); Total shoulder arthroplasty (Right, 06/2012); Cholecystectomy; Breast reduction surgery;  knee surgery (11/26/2018); Tubal ligation (1980); Submandibular gland excision; Skin cancer excision; joint replacement; eye surgery; fracture surgery; Knee Arthroplasty; and Upper gastrointestinal endoscopy (03-2021).  Allergies Levaquin [levofloxacin], Dilaudid [hydromorphone], Flurbiprofen, Nsaids, and Ezetimibe    Learning   Learning  Does the patient/guardian have any barriers to learning?: No barriers  Will there be a co-learner?: No  What is the preferred language of the patient/guardian?: English  Is an interpreter required?: No  How does the patient/guardian prefer to learn new concepts?: Demonstration; Pictures/Videos      Restrictions/Precautions  Restrictions/Precautions  Restrictions/Precautions: Other (comment)  Required Braces or Orthoses?: No      Position Activity Restriction  Other position/activity restrictions: no strenghening past 45 degree angle of body      SUBJECTIVE   Subjective  Subjective: Pt feels that shoulder is still bothering her. Feels that it is more muscular. Reports she is seeing MD in 2 weeks and will hold off on therapy until that time.  Pain   Pain Screening  Patient Currently in Pain: Yes  Pain Assessment: 0-10  Pain Level: 4  Post Treatment Pain Level: 4  Pain Location: Shoulder  Pain Orientation: Right  Pain Descriptors: Aching, Discomfort, Sore      OBJECTIVE   OT Treatment Completed:  Exercises: Therapeutic exercise (CPT 97110)   Treatment Reasoning    OT Exercise 1: light Rockwood exs  OT Exercise 2: pendulums  OT Exercise 3: table slides  OT Exercise 4: gentle passive ROM while supine  OT Exercise 5: massage    Limitations addressed: Mobility, Flexibility, Strength  Functional ability(s) targeted: Reaching overhead, Performing self care actvities, Return to work tasks, Tolerance to age appropriate activities  1:1 Time (minutes): 20  Unbillable time (minutes): 0  Total Time: 20           Ultrasound: (CPT [phone]) Treatment Reasoning    Patient Position: Seated  Ultrasound  location: Shoulder, Right  Ultrasound specified location: right upper arm/shoulder  Ultrasound frequency: 1 MHz  Ultrasound intensity (W/cm2): 2  Ultrasound mode: Continuous  Post treatment skin assessment: Intact Limitations addressed: Pain modulation  Therapist provided: Assistance  Functional ability(s) targeted: Reaching overhead, Performing self care activities, Tolerance to age appropriate activities   1:1 Time (minutes): 8  Unbillable time (minutes): 0  Total Time: 8        ASSESSMENT   Assessment  Assessment: Pt able to correctly perform Rockwood exs. Pt has ROM of shoudler WFLs. See again if/when requested.  OT Education  OT Education  OT Education: Home Exercise Program  Patient Education: gentle Rockwood exs  Barriers impacting rehab : None  PLAN   Interventions Planned (Treatment may consist of any combination of the following):    Current Treatment Recommendations: Strengthening; ROM; Pain management; Modalities    Next Visit Recommendation   Specific Instructions for Next Treatment: gentle strengthening, modalities, massage  Goals   Patient Goal(s): Patient goals : Strengthen arm - help with fine motor movements  Short Term Goals Completed by 6 weeks Goal Status   Instruction in gentle strengthening exs to shoulder STG Goal 1 Status:: In progress   Use of modalities as needed to decease discomfort STG Goal 2 Status:: In progress   Instruction in gentle AROM exs to shoulder STG Goal 3 Status:: In progress   Massage to shoulder area to ecrease discomfort STG Goal 4 Status:: New, In progress   Provide with written/illustrated HEPs STG Goal 5 Status:: In progress                 Long Term Goals Completed by 12 weeks Goal Status   Indepenent HEP LTG Goal 1 Status:: In progress   Decraese discomfort to occassional LTG Goal 2 Status:: In progress                                   Total Duration   OT Individual Minutes  Time In: 1000  Time Out: 1035  Minutes: 35    Therapist Signature: Colin Broach, OTR/L,  CHT    Date: 08/23/2022

## 2022-08-24 ENCOUNTER — Encounter: Payer: MEDICARE | Primary: Family Medicine

## 2022-08-26 ENCOUNTER — Other Ambulatory Visit: Payer: Self-pay | Admitting: Family Medicine

## 2022-08-31 ENCOUNTER — Encounter: Payer: MEDICARE | Primary: Family Medicine

## 2022-08-31 ENCOUNTER — Encounter: Admit: 2022-08-31 | Discharge: 2022-08-31 | Payer: MEDICARE | Attending: Orthopaedic Surgery | Primary: Family Medicine

## 2022-08-31 DIAGNOSIS — Z96611 Presence of right artificial shoulder joint: Secondary | ICD-10-CM

## 2022-08-31 NOTE — Progress Notes (Signed)
Lorraine Freeman (DOB:  Apr 15, 1945) is a 77 y.o. female, here for evaluation of the following chief complaint(s):  Follow-up (/Right Shoulder- RC failure?? - 5 weeks f/u /S/p Right TSA 9 yrs ago /X-RAY DONE ON 4/25 & 5/2 /LAST VISIT WITH DR PRICE ON 07/20/2022 /)      Subjective   SUBJECTIVE/OBJECTIVE:  HPI  I believe patient still has subacromial pain in the shoulder lateralizing over the shoulder particular with abduction of the shoulder she try therapy it was probably making her worse which is a little bit concerning.  I have some concerns that the rotator cuff supraspinatus is failing a little bit better.  Allergies   Allergen Reactions    Levaquin [Levofloxacin] Palpitations and Other (See Comments)     Event:        Dilaudid [Hydromorphone]     Flurbiprofen      Other reaction(s): Unknown    Nsaids      Other reaction(s): stomach upset    Ezetimibe Other (See Comments)      Current Outpatient Medications   Medication Sig Dispense Refill    omeprazole (PRILOSEC) 40 MG delayed release capsule Take 1 capsule by mouth daily 90 capsule 3    vitamin B-12 (CYANOCOBALAMIN) 100 MCG tablet Vitamin B-12      clobetasol (TEMOVATE) 0.05 % ointment Apply topically 2 times daily. 60 g 3    MAGNESIUM PO Take by mouth      BIOTIN PO Take by mouth      estradiol (ESTRACE VAGINAL) 0.1 MG/GM vaginal cream Place 1 g vaginally daily Nightly x 2 weeks, every other night x 2 weeks, then as needed 42.5 g 3    albuterol sulfate HFA (PROVENTIL;VENTOLIN;PROAIR) 108 (90 Base) MCG/ACT inhaler       vitamin D3 (CHOLECALCIFEROL) 125 MCG (5000 UT) TABS tablet   5,000 units = tabs, Oral, Daily, # 100 tabs, 0 Refill(s)      calcium carbonate 1500 (600 Ca) MG TABS tablet Take 2 tablets by mouth daily as needed      acetaminophen (TYLENOL) 325 MG tablet 1 tablet as needed Orally every 6 hrs      ALPRAZolam (XANAX) 0.25 MG tablet Take 2 tablets by mouth 2 times daily as needed (vocal cord paralysis) for up to 90 days. Max Daily Amount: 1 mg 360  tablet 0     No current facility-administered medications for this visit.      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 Ago    Erosive gastritis    Hepatic steatosis 04/06/2022    Per 03/30/22 CT    High cholesterol     Lumbar radiculopathy 07/26/2020    Lung cancer (HCC) 1993    Malignant neoplasm of skin 06/21/2020    Obesity     Osteoarthritis     Osteopenia     Patellofemoral syndrome of right knee 02/07/2021  Restrictive lung disease 03/22/2022    Rotator cuff tear 02/07/2021    Sciatica     Spasmodic dysphonia     left vocal cord paralysis      Past Surgical History:   Procedure Laterality Date    BREAST REDUCTION SURGERY      CHOLECYSTECTOMY      ELBOW SURGERY Right     EYE SURGERY      Cataracts, laser    FRACTURE SURGERY      Both wrists    JOINT REPLACEMENT      R shoulder, R ulna    KNEE ARTHROPLASTY      Torn meniscus    KNEE SURGERY  11/26/2018    Arthroscopy right knee with partial medial meniscectomy and chondroplasty Dr. Cheree Ditto    LOBECTOMY  1980    apical resection    LUMBAR FUSION  2001    Dr. Marygrace Drought SPINE SURGERY  2003    hardware removal    SHOULDER ARTHROPLASTY Right 06/2012    Dr Samuella Cota    SHOULDER SURGERY Right 1996    Dr. Cheree Ditto    SHOULDER SURGERY Right 2011    Dr. Cheree Ditto    SKIN CANCER EXCISION      multiple    SUBMANDIBULAR GLAND EXCISION      THORACOTOMY  1993    TUBAL LIGATION  1980    UPPER GASTROINTESTINAL ENDOSCOPY  03-2021    Erosive gastritis    WRIST SURGERY  Bilateral     ORIF, bilat wrist fxs    WRIST SURGERY  2007    both wrist    WRIST SURGERY  2005    broken wrist      Family History   Problem Relation Age of Onset    Colon Cancer Mother     Stroke Father     Abdominal aortic aneurysm Father     Diabetes Father     High Blood Pressure Father     Prostate Cancer Father     Hypertension Father     Heart Surgery Brother     Heart Surgery Brother     Arthritis Brother         Back,legs,shoulder    High Cholesterol Brother     Esophageal Cancer Brother     Cancer Brother         Tongue    Abdominal aortic aneurysm Brother     Cancer Brother         Adenoid    Cancer Son         tongue cancer    Colon Cancer Maternal Grandmother     Breast Cancer Paternal Grandmother     Breast Cancer Maternal Aunt         Breast surgery      Social History     Occupational History    Not on file   Tobacco Use    Smoking status: Former     Current packs/day: 0.00     Average packs/day: 1 pack/day for 13.8 years (13.8 ttl pk-yrs)     Types: Cigarettes     Start date: 06/19/1963     Quit date: 03/20/1977     Years since quitting: 45.4    Smokeless tobacco: Never   Vaping Use    Vaping Use: Never used   Substance and Sexual Activity    Alcohol use: Not Currently     Alcohol/week: 10.0 standard drinks of alcohol  Types: 10 Glasses of wine per week     Comment: 2/day    Drug use: Never    Sexual activity: Not Currently     Partners: Male       XR SHOULDER RIGHT (MIN 2 VIEWS)  X-ray of the right shoulder and 2 AP view only shows a stable anatomic   total shoulder replacement arthroplasty no periprosthetic loosening of the   humeral stem or the glenoid component.  Slight subacromial humeral   distance narrowing.  Comparable to x-rays taken in 2015.  Additionally she   has old metallic anchor in the greater tuberosity seems to be quiet as   well.  Soft tissues otherwise appear satisfactory there is a small   calcification in the supraspinatus is also unchanged from previous   x-rays.[07/20/2022  12:44:10 PM - PRICE, Isys Tietje L JR.]     Reviewing the x-rays the implant appears to be stable but there may be some slight subacromial distance narrowing.  Review of Systems   Musculoskeletal:  Positive for arthralgias.          Objective   Ortho Exam     Well-developed well-nourished female no acute distress.    Right shoulder  INSPECTION: Well-healed anterior incision  PALPATION: Subacromial tenderness  ROM: Flexion 120 degrees, abduction 120 degrees, external rotation  70 degrees, internal rotation the 60 degrees, extension the 30 degrees, adduction the 20 degrees unchanged.  STRENGTH TESTING (OUT OF 5): Satisfactory rotator cuff  strength.  No breakaway  STABILITY TESTS: all stability tests are negative.  UPPER EXTREMITY EXAM: sensation intact to light touch  distally,brisk capillary refill.  TESTS: Positive NEER, negative labral grind.               Assessment & Plan   ASSESSMENT/PLAN:  1. History of total shoulder replacement, right  -     MRI SHOULDER RIGHT WO CONTRAST; Future  2. Rotator cuff syndrome, right  Because of persistent pain I recommended getting MRI first we may need CT scan arthrogram but lets check the volume in the muscularity see if there is any atrophy of the supraspinatus that would point to the rotator cuff failure.  Right now the implant fortunately appears to be stable I will have her come back after MRI scanning is complete.  Please note the patient has some left lateral epicondylar pain he has just begun will reevaluated after she comes back after MRI scan if it is persistent.   Return for After MRI.         An electronic signature was used to authenticate this note.    --Wyvonna Plum, MD

## 2022-09-01 ENCOUNTER — Inpatient Hospital Stay: Admit: 2022-09-01 | Payer: MEDICARE | Attending: Orthopaedic Surgery | Primary: Family Medicine

## 2022-09-01 DIAGNOSIS — Z96611 Presence of right artificial shoulder joint: Secondary | ICD-10-CM

## 2022-09-04 ENCOUNTER — Other Ambulatory Visit: Payer: Self-pay | Admitting: Family Medicine

## 2022-09-04 NOTE — Telephone Encounter (Signed)
PT. WANTS DR. PRICE TO KNOW THAT SHE HAD HER MRI ON 09/01/22.

## 2022-09-04 NOTE — Telephone Encounter (Signed)
Patient coming in next Tuesday in [facility] for film review with Dr. Samuella Cota.

## 2022-09-04 NOTE — Progress Notes (Unsigned)
NEUROLOGY FOLLOW UP OFFICE NOTE  Maria Schneider [id number]  Assessment/Plan:   Memory deficits, at this time, I do not appreciate any findings concerning for neurodegenerative disease Balance disorder - possibly related to underlying peripheral neuropathy Tremor - possibly essential tremor    Will check labs for other causes of neuropathy:  ANA with ENA panel, IFE, ACE Continue routine walking and exercise to help with balance Monitor:  memory, tremor, balance Plan to follow up in one year or sooner if needed for re-evaluation.     Subjective:  Maria Schneider is a 77 year old female with CAD, HTN, HLD who follows up for memory deficits and balance disorder.  History supplemented by her accompanying husband an referring provider's note.  UPDATE: MRI of brain without contrast on 04/27/2022 personally reviewed revealed generalized volume loss since imaging from 2017 and very mild chronic small vessel ischemic changes within the cerebral white matter but otherwise unremarkable.    She thinks balance is improved a bit, but she is more careful.  She now notes tremors in the left hand.  Usually occurs when her arm is resting on arm rest and she is holding something in her hand.  Does not involve right hand.  Tremors do not run in her family.  No significant changes in memory.      HISTORY: She began experiencing balance problems in 2023.  She describes it mostly as a problem with depth perception.  She has cataracts.  She may walk into things.  She also feels like she is dragging her left leg.  At first she thought it was related to having had bilateral hip replacement but it has gotten worse.  If she turns too quickly or bends over, she may have vertigo for a few seconds.  Sometimes she notices brief vertical diplopia.  She finds that she chokes more frequently.  She also reports increased problems with short term memory over the past couple of months.  She will quickly forget conversations  after 10 minutes.  She may need to think a moment to remember things that day, such as what she ate for breakfast.  If she is talking and is interrupted, she easily loses her train of thought and has difficulty redirecting to her conversation.  Sometimes she has difficulty pronouncing words.  Denies neck and back pain.  Denies numbness in the feet.  Sometimes if she is holding something in her left hand, it will shake.  Usually that occurs after she has been leaning on her elbow for a while.     TSH was 3.06.  She was found to have a B12 level of 91 in June 2023, but symptoms persisted despite injections with repeat level in October 2023 >1500.  Around that time, her sister had recently passed away unexpectedly, which may be contributing to increased emotional stress that could be affecting symptoms such as memory.    PAST MEDICAL HISTORY: Past Medical History:  Diagnosis Date   Allergy    Arthritis    Asthma    Cancer (HCC)    hx skin cancer   Depression    Detrusor instability    Diverticulosis    not symptomatic   GERD (gastroesophageal reflux disease)    History of kidney stones    History of pancreatitis    April 2015   History of skin cancer    Hyperlipidemia    Hypertension    Osteopenia    Urinary incontinence    Vitamin  D deficiency    RESOLVED     MEDICATIONS: Current Outpatient Medications on File Prior to Visit  Medication Sig Dispense Refill   aspirin EC 81 MG tablet Take 1 tablet (81 mg total) by mouth daily. Swallow whole. 90 tablet 3   buPROPion (WELLBUTRIN XL) 300 MG 24 hr tablet TAKE 1 TABLET BY MOUTH EVERY DAY 90 tablet 3   Cholecalciferol (VITAMIN D3) 25 MCG (1000 UT) CAPS Take 5,000 Units by mouth daily.      esomeprazole (NEXIUM) 20 MG capsule Take 1 capsule (20 mg total) by mouth 2 (two) times daily before a meal. 2 capsule 0   Evolocumab (REPATHA SURECLICK) 140 MG/ML SOAJ Inject 140 mg into the skin every 14 (fourteen) days. 2 mL 11   ezetimibe (ZETIA) 10  MG tablet TAKE 1 TABLET BY MOUTH EVERY DAY 90 tablet 0   fenofibrate 160 MG tablet TAKE 1 TABLET BY MOUTH EVERY DAY 90 tablet 0   metoprolol succinate (TOPROL-XL) 25 MG 24 hr tablet TAKE 1 TABLET (25 MG TOTAL) BY MOUTH DAILY. 30 tablet 1   MYRBETRIQ 50 MG TB24 tablet Take 50 mg by mouth daily.  11   sertraline (ZOLOFT) 100 MG tablet TAKE 2 TABLETS BY MOUTH EVERY DAY 90 tablet 0   valsartan (DIOVAN) 80 MG tablet TAKE 1 TABLET BY MOUTH EVERY DAY 90 tablet 0   No current facility-administered medications on file prior to visit.    ALLERGIES: Allergies  Allergen Reactions   Ace Inhibitors     REACTION: cough   Codeine Nausea And Vomiting   Fentanyl Itching   Statins     REACTION: MYALGIAS-muscle pain    FAMILY HISTORY: Family History  Problem Relation Age of Onset   Hypertension Mother    Osteoporosis Mother    Heart disease Father    Osteoporosis Sister    Breast cancer Paternal Aunt        Age 10's   Diabetes Paternal Uncle       Objective:  Blood pressure 130/62, height 5\' 5"  (1.651 m), weight 163 lb (73.9 kg). General: No acute distress.  Patient appears well-groomed.   Head:  Normocephalic/atraumatic Eyes:  Fundi examined but not visualized Neck: supple, no paraspinal tenderness, full range of motion Heart:  Regular rate and rhythm Neurological Exam: alert and oriented.  Speech fluent and not dysarthric, language intact.      09/05/2022   10:00 AM 03/06/2022    1:00 PM  Montreal Cognitive Assessment   Visuospatial/ Executive (0/5) 3 4  Naming (0/3) 3 3  Attention: Read list of digits (0/2) 2 2  Attention: Read list of letters (0/1) 1 1  Attention: Serial 7 subtraction starting at 100 (0/3) 3 3  Language: Repeat phrase (0/2) 2 2  Language : Fluency (0/1) 1 1  Abstraction (0/2) 2 2  Delayed Recall (0/5) 2 1  Orientation (0/6) 6 6  Total 25 25  Adjusted Score (based on education)  25   CN II-XII intact. Bulk and tone normal, muscle strength 5/5 throughout.   Sensation to pinprick intact.  Vibratory sensation reduced in feet.  Deep tendon reflexes 2+ throughout, toes downgoing.  Finger to nose testing intact.  Gait appears steady and narrow-based.  Some difficulty with tandem walking.  Romberg negative.   Shon Millet, DO  CC: Gershon Crane, MD

## 2022-09-05 ENCOUNTER — Ambulatory Visit: Payer: Medicare PPO | Admitting: Neurology

## 2022-09-05 ENCOUNTER — Other Ambulatory Visit (INDEPENDENT_AMBULATORY_CARE_PROVIDER_SITE_OTHER): Payer: Medicare PPO

## 2022-09-05 ENCOUNTER — Encounter: Payer: Self-pay | Admitting: Neurology

## 2022-09-05 VITALS — BP 130/62 | Ht 65.0 in | Wt 163.0 lb

## 2022-09-05 DIAGNOSIS — G629 Polyneuropathy, unspecified: Secondary | ICD-10-CM

## 2022-09-05 DIAGNOSIS — R251 Tremor, unspecified: Secondary | ICD-10-CM

## 2022-09-05 DIAGNOSIS — R2689 Other abnormalities of gait and mobility: Secondary | ICD-10-CM | POA: Diagnosis not present

## 2022-09-05 DIAGNOSIS — R413 Other amnesia: Secondary | ICD-10-CM

## 2022-09-05 NOTE — Patient Instructions (Signed)
We will check for other causes of neuropathy:  ANA with ENA panel, IFE, ACE. Continue routine walking and exercise to help with balance Monitor:  memory, tremor, balance Plan to follow up in one year or sooner if needed for re-evaluation.

## 2022-09-06 ENCOUNTER — Encounter

## 2022-09-06 LAB — IMMUNOFIXATION, SERUM: IgG (Immunoglobin G), Serum: 609 mg/dL (ref 586–1602)

## 2022-09-06 LAB — ANGIOTENSIN CONVERTING ENZYME: Angiotensin-Converting Enzyme: 34 U/L (ref 9–67)

## 2022-09-06 LAB — ANA+ENA+DNA/DS+SCL 70+SJOSSA/B

## 2022-09-06 MED ORDER — ALPRAZOLAM 0.25 MG PO TABS
0.25 MG | ORAL_TABLET | Freq: Two times a day (BID) | ORAL | 0 refills | Status: DC | PRN
Start: 2022-09-06 — End: 2023-02-05

## 2022-09-06 NOTE — Telephone Encounter (Signed)
Requested Prescriptions     Pending Prescriptions Disp Refills    ALPRAZolam (XANAX) 0.25 MG tablet 360 tablet 0     Sig: Take 2 tablets by mouth 2 times daily as needed (vocal cord paralysis) for up to 90 days. Max Daily Amount: 1 mg

## 2022-09-07 LAB — ANA+ENA+DNA/DS+SCL 70+SJOSSA/B: ENA RNP Ab: 0.4 AI (ref 0.0–0.9)

## 2022-09-07 LAB — IMMUNOFIXATION, SERUM: IgM (Immunoglobulin M), Srm: 126 mg/dL (ref 26–217)

## 2022-09-12 ENCOUNTER — Ambulatory Visit: Admit: 2022-09-12 | Discharge: 2022-09-12 | Payer: MEDICARE | Attending: Orthopaedic Surgery | Primary: Family Medicine

## 2022-09-12 DIAGNOSIS — Z96611 Presence of right artificial shoulder joint: Secondary | ICD-10-CM

## 2022-09-12 LAB — ANA+ENA+DNA/DS+SCL 70+SJOSSA/B
ENA SSA (RO) Ab: <0.2 AI (ref 0.0–0.9)
ENA SSB (LA) Ab: <0.2 AI (ref 0.0–0.9)
Scleroderma (Scl-70) (ENA) Antibody, IgG: <0.2 AI (ref 0.0–0.9)
dsDNA Ab: <1 IU/mL (ref 0–9)

## 2022-09-12 LAB — IMMUNOFIXATION, SERUM: IgA/Immunoglobulin A, Serum: 92 mg/dL (ref 64–422)

## 2022-09-12 NOTE — Progress Notes (Signed)
Lorraine Freeman (DOB:  February 22, 1946) is a 77 y.o. female, here for evaluation of the following chief complaint(s):  Results (MRI Review- Right Shoulder)      Subjective   SUBJECTIVE/OBJECTIVE:  HPI  Patient returns after MRI scan of her shoulder shows no significant muscle atrophy of the right shoulder rotator cuff despite the artifact from having metal in her shoulder she says the pain is feeling a bit better.  That is certainly reassuring.  She did have some lateral epicondylar pain in the opposite elbow but this is also better.  Review of Systems   Musculoskeletal:  Positive for arthralgias.          Objective   Ortho Exam   Right shoulder  INSPECTION: Well-healed anterior incision  PALPATION: Subacromial tenderness  ROM: Flexion 120 degrees, abduction 120 degrees, external rotation  70 degrees, internal rotation the 60 degrees, extension the 30 degrees, adduction the 20 degrees unchanged.  STRENGTH TESTING (OUT OF 5): Satisfactory rotator cuff  strength.  No breakaway  STABILITY TESTS: all stability tests are negative.  UPPER EXTREMITY EXAM: sensation intact to light touch  distally,brisk capillary refill.  TESTS: Positive NEER, negative labral grind.       XR SHOULDER RIGHT (MIN 2 VIEWS)  X-ray of the right shoulder and 2 AP view only shows a stable anatomic   total shoulder replacement arthroplasty no periprosthetic loosening of the   humeral stem or the glenoid component.  Slight subacromial humeral   distance narrowing.  Comparable to x-rays taken in 2015.  Additionally she   has old metallic anchor in the greater tuberosity seems to be quiet as   well.  Soft tissues otherwise appear satisfactory there is a small   calcification in the supraspinatus is also unchanged from previous   x-rays.[07/20/2022 12:44:10 PM - PRICE, Gianlucca Szymborski L JR.]       Assessment & Plan   ASSESSMENT/PLAN:  1. History of total shoulder replacement, right  2. Rotator cuff syndrome, right  Despite no evidence of rotator cuff rupture or  atrophy.  I believe she is having some rotator cuff tendon that may be causing pain.  Will have her come back in 3 months for follow-up evaluation since she is going to sleep better.  She will also do Nirschl exercises to the left elbow.    No follow-ups on file.           An electronic signature was used to authenticate this note.    --Wyvonna Plum, MD

## 2022-09-12 NOTE — Progress Notes (Signed)
LMOVm for patient to call the call the office back.

## 2022-09-18 ENCOUNTER — Other Ambulatory Visit: Payer: Self-pay | Admitting: Family Medicine

## 2022-10-02 ENCOUNTER — Other Ambulatory Visit: Payer: Self-pay | Admitting: Family Medicine

## 2022-10-13 ENCOUNTER — Encounter: Payer: Self-pay | Admitting: Family Medicine

## 2022-10-17 ENCOUNTER — Ambulatory Visit: Admit: 2022-10-17 | Discharge: 2022-10-17 | Payer: MEDICARE | Attending: Family Medicine | Primary: Family Medicine

## 2022-10-17 ENCOUNTER — Encounter

## 2022-10-17 DIAGNOSIS — J3800 Paralysis of vocal cords and larynx, unspecified: Secondary | ICD-10-CM

## 2022-10-17 MED ORDER — BEMPEDOIC ACID 180 MG PO TABS
180 MG | ORAL_TABLET | Freq: Every day | ORAL | 3 refills | Status: DC
Start: 2022-10-17 — End: 2023-04-19

## 2022-10-17 MED ORDER — TIRZEPATIDE-WEIGHT MANAGEMENT 2.5 MG/0.5ML SC SOAJ
2.5 | SUBCUTANEOUS | 1 refills | Status: DC
Start: 2022-10-17 — End: 2023-12-10

## 2022-10-17 NOTE — Progress Notes (Signed)
CHIEF COMPLAINT:  Chief Complaint   Patient presents with    Follow-up     6 month        HISTORY OF PRESENT ILLNESS:    Lorraine Freeman is a 77 y.o. female  with past medical history of reactive lung disease, asthma, lung ca, hepatic steatosis, GERD, renal calculi who presents for 6 month follow up.    Weight: She is frustrated with her weight. She exercises, she follows a balanced diet. BMI is 30. She wonders about a GLP1?     Death of child: Daughter died this past spring-unfortunately she had multiple health issues, Lorraine Freeman was not surprised.    Lumbar spondylosis: She is having muscle spasms of late. She is going for another MRI. She has had multiple spine injections (sees Dr. Irving Burton Darr). She saw Dr. Laurel Dimmer in the past-recommended spinal stimulator, she declined. She does her back exercises every day.      Right shoulder: 4 surgeries. Seeing Dr. Samuella Cota. Unfortunately, not much to do at this time.     Cholesterol: Statin intolerant, Nexlitol was cost prohibitive-she tolerated samples well.     Hepatic steatosis: Liver enzymes ok in January 2024. Noted on January CT.     The 10-year ASCVD risk score (Arnett DK, et al., 2019) is: 12.6%    Values used to calculate the score:      Age: 53 years      Sex: Female      Is Non-Hispanic African American: No      Diabetic: No      Tobacco smoker: No      Systolic Blood Pressure: 118 mmHg      Is BP treated: No      HDL Cholesterol: 51 mg/dL      Total Cholesterol: 208 mg/dL      PHQ:      1/61/0960     7:57 AM   PHQ-9    Little interest or pleasure in doing things 0   Feeling down, depressed, or hopeless 0   PHQ-2 Score 0   PHQ-9 Total Score 0       CURRENT MEDICATION LIST:    Current Outpatient Medications   Medication Sig Dispense Refill    MILK THISTLE PO Take by mouth      Plant Sterols and Stanols (CHOLESTOFF PO) Take by mouth      Bempedoic Acid 180 MG TABS Take 180 mg by mouth daily 90 tablet 3    Tirzepatide-Weight Management 2.5 MG/0.5ML SOAJ Inject 2.5 mg  into the skin once a week 2 mL 1    ALPRAZolam (XANAX) 0.25 MG tablet Take 2 tablets by mouth 2 times daily as needed (vocal cord paralysis) for up to 90 days. Max Daily Amount: 1 mg 360 tablet 0    omeprazole (PRILOSEC) 40 MG delayed release capsule Take 1 capsule by mouth daily 90 capsule 3    vitamin B-12 (CYANOCOBALAMIN) 100 MCG tablet Vitamin B-12      clobetasol (TEMOVATE) 0.05 % ointment Apply topically 2 times daily. 60 g 3    MAGNESIUM PO Take by mouth      BIOTIN PO Take by mouth      estradiol (ESTRACE VAGINAL) 0.1 MG/GM vaginal cream Place 1 g vaginally daily Nightly x 2 weeks, every other night x 2 weeks, then as needed 42.5 g 3    albuterol sulfate HFA (PROVENTIL;VENTOLIN;PROAIR) 108 (90 Base) MCG/ACT inhaler       vitamin D3 (CHOLECALCIFEROL) 125  MCG (5000 UT) TABS tablet   5,000 units = tabs, Oral, Daily, # 100 tabs, 0 Refill(s)      calcium carbonate 1500 (600 Ca) MG TABS tablet Take 2 tablets by mouth daily as needed      acetaminophen (TYLENOL) 325 MG tablet 1 tablet as needed Orally every 6 hrs       No current facility-administered medications for this visit.        ALLERGIES:    Allergies   Allergen Reactions    Levaquin [Levofloxacin] Palpitations and Other (See Comments)     Event:        Dilaudid [Hydromorphone]     Flurbiprofen      Other reaction(s): Unknown    Nsaids      Other reaction(s): stomach upset    Ezetimibe Other (See Comments)        HISTORY:  Past Medical History:   Diagnosis Date    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) 03/22/2022    DX'D 1993    Adhesive capsulitis of shoulder 02/07/2021    Asthma 1999    Bursitis Hip    Chondromalacia of patella, right 02/07/2021    Chronic back pain     Complex tear of medial meniscus of right knee, subsequent encounter 02/09/2021    Complex tear of medial meniscus, current injury, right knee, subsequent encounter 02/07/2021    Drug effect Levaquin    Tachycardia, sob    Essential hypertension 01/19/2020    Fibromyalgia     Fractures      GERD (gastroesophageal reflux disease) 7 yrs. Ago    Erosive gastritis    Hepatic steatosis 04/06/2022    Per 03/30/22 CT    High cholesterol     Lumbar radiculopathy 07/26/2020    Lung cancer (HCC) 1993    Malignant neoplasm of skin 06/21/2020    Obesity     Osteoarthritis     Osteopenia     Osteoporosis     Patellofemoral syndrome of right knee 02/07/2021    Restrictive lung disease 03/22/2022    Rotator cuff tear 02/07/2021    Sciatica     Spasmodic dysphonia     left vocal cord paralysis    Spondylolisthesis L5      Past Surgical History:   Procedure Laterality Date    BACK SURGERY  L5    BREAST REDUCTION SURGERY      CHOLECYSTECTOMY      ELBOW SURGERY Right     EYE SURGERY      Cataracts, laser    FRACTURE SURGERY      Both wrists    HAND SURGERY  R & L    JOINT REPLACEMENT      R shoulder, R ulna    KNEE ARTHROPLASTY      Torn meniscus    KNEE ARTHROSCOPY  R knee    KNEE SURGERY  11/26/2018    Arthroscopy right knee with partial medial meniscectomy and chondroplasty Dr. Cheree Ditto    LOBECTOMY  1980    apical resection    LUMBAR FUSION  2001    Dr. Marygrace Drought SPINE SURGERY  2003    hardware removal    SHOULDER ARTHROPLASTY Right 06/2012    Dr Erasmo Downer SURGERY Right 1996    Dr. Cheree Ditto    SHOULDER SURGERY Right 2011    Dr. Cheree Ditto    SKIN CANCER EXCISION      multiple  SPINAL FUSION  L 5    SUBMANDIBULAR GLAND EXCISION      THORACOTOMY  1993    TUBAL LIGATION  1980    UPPER GASTROINTESTINAL ENDOSCOPY  03-2021    Erosive gastritis    WRIST FRACTURE SURGERY  Both    WRIST SURGERY Bilateral     ORIF, bilat wrist fxs    WRIST SURGERY  2007    both wrist    WRIST SURGERY  2005    broken wrist      Social History     Socioeconomic History    Marital status: Married     Spouse name: Not on file    Number of children: Not on file    Years of education: Not on file    Highest education level: Not on file   Occupational History    Not on file   Tobacco Use    Smoking status: Former     Current packs/day: 0.00      Average packs/day: 1 pack/day for 13.8 years (13.8 ttl pk-yrs)     Types: Cigarettes     Start date: 06/19/1963     Quit date: 03/20/1977     Years since quitting: 45.6    Smokeless tobacco: Never   Vaping Use    Vaping Use: Never used   Substance and Sexual Activity    Alcohol use: Not Currently     Comment: 2/day    Drug use: Never    Sexual activity: Not Currently     Partners: Male   Other Topics Concern    Not on file   Social History Narrative    Not on file     Social Determinants of Health     Financial Resource Strain: Not on file   Food Insecurity: Not on file   Transportation Needs: Not on file   Physical Activity: Sufficiently Active (04/18/2022)    Exercise Vital Sign     Days of Exercise per Week: 7 days     Minutes of Exercise per Session: 40 min   Stress: Not on file   Social Connections: Not on file   Intimate Partner Violence: Not on file   Housing Stability: Not on file      Family History   Problem Relation Age of Onset    Colon Cancer Mother     Cancer Mother         Colon    Stroke Father     Abdominal aortic aneurysm Father     Diabetes Father     High Blood Pressure Father     Prostate Cancer Father     Hypertension Father     Cancer Father         Prostate    Heart Surgery Brother     Heart Surgery Brother     Arthritis Brother         Back,legs,shoulder    High Cholesterol Brother     Esophageal Cancer Brother     Cancer Brother         Tongue    Abdominal aortic aneurysm Brother     Cancer Brother         Adenoid    Cancer Son         tongue cancer    Colon Cancer Maternal Grandmother     Breast Cancer Paternal Grandmother     Breast Cancer Maternal Aunt         Breast surgery  Cancer Maternal Aunt         Breast    Cancer Brother         Esophagus    Cancer Maternal Uncle         Lung    Cancer Maternal Uncle         Prostate    Cancer Maternal Uncle         Lung          PHYSICAL EXAM:  Physical Exam  Vitals reviewed.   Constitutional:       Appearance: Normal appearance.   HENT:       Head: Normocephalic.   Eyes:      Extraocular Movements: Extraocular movements intact.      Conjunctiva/sclera: Conjunctivae normal.   Cardiovascular:      Rate and Rhythm: Normal rate and regular rhythm.      Pulses: Normal pulses.           Radial pulses are 2+ on the right side and 2+ on the left side.      Heart sounds: Normal heart sounds.   Pulmonary:      Effort: Pulmonary effort is normal.      Breath sounds: Examination of the right-lower field reveals decreased breath sounds. Examination of the left-lower field reveals decreased breath sounds. Decreased breath sounds present. No wheezing, rhonchi or rales.   Musculoskeletal:      Cervical back: Normal range of motion and neck supple.      Right lower leg: No edema.      Left lower leg: No edema.   Skin:     General: Skin is warm and dry.   Neurological:      General: No focal deficit present.      Mental Status: She is alert and oriented to person, place, and time. Mental status is at baseline.   Psychiatric:         Mood and Affect: Mood normal.         Behavior: Behavior normal.          Vital Signs -   Visit Vitals  BP 118/76 (Site: Left Upper Arm, Position: Sitting)   Pulse 72   Ht 1.6 m (5\' 3" )   Wt 78.7 kg (173 lb 8 oz)   SpO2 100%   BMI 30.73 kg/m            LABS  No results found for this visit on 10/17/22.  Hospital Outpatient Visit on 04/18/2022   Component Date Value Ref Range Status    Hemoglobin A1C 04/18/2022 5.1  4.0 - 6.0 % Final    Comment: HEMOGLOBIN A1C INTERPRETATION:    The following arbitrary ranges may be used for interpretation of the results.  However, factors such as duration of diabetes, adherence to therapy, and  patient age should also be considered in assessing degree of blood glucose  control.    Hemoglobin A1C                 Avg. Blood Sugar  --------------------------------------------------------------  6%                           135 mg/dL  7%                           170 mg/dL  8%  205  mg/dL  9%                           240 mg/dL  16%                          275 mg/dL    ======================================================    A1C                      Glucose Control  ----------------------------------------------------------------  < 6.0 %                   Normal  6.0 - 6.9 %               Abnormal  7.0 - 7.9 %               Sub-Optimal Control  > 8.0 %                   Inadequate Control      Est. Avg. Glucose, WB 04/18/2022 100   Final    Est. Avg. Glucose-calculated 04/18/2022 104   Final    Cholesterol 04/18/2022 208 (H)  100 - 200 mg/dL Final    Comment: The National Cholesterol Education Program has published reference  cholesterol values for cardiovascular risk to be:    Less than 200 mg/dL     = Low Risk    109 to 239 mg/dL        = Borderline Risk    240mg /dL and greater    = High Risk      HDL 04/18/2022 51  >=50 mg/dL Final    Comment: The National Lipid Association and the [employer] Cholesterol Education Program  (NCEP) have set the guidelines for high-density lipoprotein (HDL) cholesterol  in adults ages 85 and up.      Triglycerides 04/18/2022 110  0 - 149 mg/dL Final    Comment:   TRIGLYCERIDE INTERPRETATION:                          Recommended Fasting Triglyc Levels for Adults                        =============================================                        Desirable                        < 150 mg/dL                        Average                          < 200 mg/dL                        Borderline High             200 to 500 mg/dL                        Hypertriglyceridemic             > 500 mg/dL                        =============================================  LDL Cholesterol 04/18/2022 135.0 (H)  0.0 - 100.0 mg/dL Final    LDL/HDL Ratio 04/18/2022 2.7   Final    Chol/HDL Ratio 04/18/2022 4.1  0.0 - 4.4 Final    VLDL 04/18/2022 22.0  5.0 - 40.0 mg/dL Final    Sodium 66/08/3014 141  135 - 145 mmol/L Final    Potassium 04/18/2022 4.2  3.5 - 5.3 mmol/L  Final    Chloride 04/18/2022 105  98 - 107 mmol/L Final    CO2 04/18/2022 27  22 - 29 mmol/L Final    Glucose 04/18/2022 96  70 - 99 mg/dL Final    BUN 03/28/3233 10  8 - 23 mg/dL Final    Creatinine 57/32/2025 0.8  0.5 - 1.0 mg/dL Final    Anion Gap 42/70/6237 9  2 - 17 mmol/L Final    Osmolaliy Calculated 04/18/2022 280  270 - 287 mOsm/kg Final    Calcium 04/18/2022 9.2  8.8 - 10.2 mg/dL Final    Total Protein 04/18/2022 6.8  6.4 - 8.3 g/dL Final    Albumin 62/83/1517 4.1  3.5 - 5.2 g/dL Final    Globulin 61/60/7371 2.6  1.9 - 4.4 g/dL Final    Albumin/Globulin Ratio 04/18/2022 1.57  1.00 - 2.70 Final    Total Bilirubin 04/18/2022 0.53  0.00 - 1.20 mg/dL Final    Alk Phosphatase 04/18/2022 62  35 - 117 unit/L Final    AST 04/18/2022 23  0 - 35 unit/L Final    ALT 04/18/2022 25  0 - 35 unit/L Final    Est, Glom Filt Rate 04/18/2022 77  >=60 mL/min/1.81m Final    Comment: VERIFIED by Discern Expert.  GFR Interpretation:                                                                         % OF  KIDNEY  GFR                                                        STAGE  FUNCTION  ==================================================================================    > 90        Normal kidney function                       STAGE 1  90-100%  89 to 60      Mild loss of kidney function                 STAGE 2  80-60%  59 to 45      Mild to moderate loss of kidney function     STAGE 3a  59-45%  44 to 30      Moderate to severe loss of kidney function   STAGE 3b  44-30%  29 to 15      Severe loss of kidney function               STAGE 4  29-15%    < 15        Kidney failure  STAGE 5  <15%  ==================================================================================  Modified from National Kidney Foundation    GFR Calculation performed using the CKD-EPI 2021 equation developed for use  with IDMS traceable creatinine methods and                            is the calculation recommended  by  the [facility] for estimating GFR in adults.      WBC 04/18/2022 4.6  3.8 - 10.6 x10e3/mcL Final    RBC 04/18/2022 4.60  3.60 - 5.20 x10e6/mcL Final    Hemoglobin 04/18/2022 14.4  11.5 - 15.7 g/dL Final    Hematocrit 16/12/9602 43.2  34.0 - 47.0 % Final    MCV 04/18/2022 93.9  81.0 - 99.0 fL Final    MCH 04/18/2022 31.3  27.0 - 34.5 pg Final    MCHC 04/18/2022 33.3  32.0 - 36.0 g/dL Final    RDW 54/11/8117 12.1  11.0 - 16.0 % Final    Platelets 04/18/2022 281  140 - 440 x10e3/mcL Final    MPV 04/18/2022 9.0  7.2 - 13.2 fL Final    NRBC Automated 04/18/2022 0.0  0.0 - 0.2 % Final    NRBC Absolute 04/18/2022 0.000  0.000 - 0.012 x10e3/mcL Final    Neutrophils % 04/18/2022 51.1  42.0 - 74.0 % Final    Lymphocytes 04/18/2022 35.8  15.0 - 45.0 % Final    Monocytes % 04/18/2022 10.3  4.0 - 12.0 % Final    Eosinophils % 04/18/2022 2.2  0.0 - 7.0 % Final    Basophils % 04/18/2022 0.4  0.0 - 2.0 % Final    Neutrophils Absolute 04/18/2022 2.3  1.6 - 7.3 x10e3/mcL Final    Lymphocytes Absolute 04/18/2022 1.6  1.0 - 3.2 x10e3/mcL Final    Monocytes Absolute 04/18/2022 0.5  0.3 - 1.0 x10e3/mcL Final    Eosinophils Absolute 04/18/2022 0.1  0.0 - 0.5 x10e3/mcL Final    Basophils Absolute 04/18/2022 0.0  0.0 - 0.2 x10e3/mcL Final    Immature Granulocytes % 04/18/2022 0.2  0.0 - 0.6 % Final    Immature Grans (Abs) 04/18/2022 0.01  0.00 - 0.06 x10e3/mcL Final       IMPRESSION/PLAN    1. Vocal cord paralysis  Comments:  Stable  2. BMI 30.0-30.9,adult  -     Tirzepatide-Weight Management 2.5 MG/0.5ML SOAJ; Inject 2.5 mg into the skin once a week, Disp-2 mL, R-1Normal  3. Mixed hyperlipidemia  -     Bempedoic Acid 180 MG TABS; Take 180 mg by mouth daily, Disp-90 tablet, R-3Normal  -     Tirzepatide-Weight Management 2.5 MG/0.5ML SOAJ; Inject 2.5 mg into the skin once a week, Disp-2 mL, R-1Normal  4. Hepatic steatosis  -     Tirzepatide-Weight Management 2.5 MG/0.5ML SOAJ; Inject 2.5 mg into the skin once a week,  Disp-2 mL, R-1Normal  -     Comprehensive Metabolic Panel; Future  5. Statin intolerance  -     Bempedoic Acid 180 MG TABS; Take 180 mg by mouth daily, Disp-90 tablet, R-3Normal  6. Elevated fasting glucose  7. Screening for blood disease  -     CBC with Auto Differential; Future  8. Liver disease, unspecified  -     Lipid Panel; Future     Discussed the following regarding GLP1 rx:   *Pt understands weight loss likely to return if medication is stopped  *Pt aware of cost of  medication and/or potential for lack of insurance coverage  *Pt denies hx medullary thyroid ca of self or family; pt denies personal hx of gastroparesis  *GLP1 step dosing reviewed.  *The most common side effect is nausea. Recommend small meals and avoid greasy foods.  *This medication mimics a hunger hormone. You will notice that you will feel full faster.  *You may not tolerate fried or fatty foods as you previously did, so please be mindful of this.  *Consider setting a weekly timer on your phone to remind you to take your medication.      Follow up and Dispositions:  Return in about 6 months (around 04/19/2023) for SAWV.       Rise Paganini, APRN - NP

## 2022-10-17 NOTE — Telephone Encounter (Signed)
I suggest she go into Dr. Lucky Rathke office and insist that they work you in reasonably soon

## 2022-10-19 ENCOUNTER — Other Ambulatory Visit: Payer: Self-pay | Admitting: Family Medicine

## 2022-10-20 ENCOUNTER — Inpatient Hospital Stay: Admit: 2022-10-20 | Discharge: 2022-10-20 | Payer: MEDICARE | Primary: Family Medicine

## 2022-10-20 DIAGNOSIS — K769 Liver disease, unspecified: Secondary | ICD-10-CM

## 2022-10-20 LAB — CBC WITH AUTO DIFFERENTIAL
Basophils %: 0.6 % (ref 0.0–2.0)
Basophils Absolute: 0 10*3/uL (ref 0.0–0.2)
Eosinophils %: 2.2 % (ref 0.0–7.0)
Eosinophils Absolute: 0.1 10*3/uL (ref 0.0–0.5)
Hematocrit: 43.5 % (ref 34.0–47.0)
Hemoglobin: 14.4 g/dL (ref 11.5–15.7)
Immature Grans (Abs): 0.01 10*3/uL (ref 0.00–0.06)
Immature Granulocytes %: 0.2 % (ref 0.0–0.6)
Lymphocytes Absolute: 2.2 10*3/uL (ref 1.0–3.2)
Lymphocytes: 44.8 % (ref 15.0–45.0)
MCH: 30.6 pg (ref 27.0–34.5)
MCHC: 33.1 g/dL (ref 30.0–36.0)
MCV: 92.6 fL (ref 81.0–99.0)
MPV: 9 fL (ref 7.0–12.2)
Monocytes %: 9.8 % (ref 4.0–12.0)
Monocytes Absolute: 0.5 10*3/uL (ref 0.3–1.0)
NRBC Absolute: 0 10*3/uL (ref 0.000–0.012)
NRBC Automated: 0 % (ref 0.0–0.2)
Neutrophils %: 42.4 % (ref 42.0–74.0)
Neutrophils Absolute: 2.1 10*3/uL (ref 1.6–7.3)
Platelets: 273 10*3/uL (ref 140–440)
RBC: 4.7 x10e6/mcL (ref 3.60–5.20)
RDW: 12.5 % (ref 10.0–17.0)
WBC: 4.9 10*3/uL (ref 3.8–10.6)

## 2022-10-20 LAB — COMPREHENSIVE METABOLIC PANEL
ALT: 19 U/L (ref 0–35)
AST: 18 U/L (ref 0–35)
Albumin/Globulin Ratio: 1.68 (ref 1.00–2.70)
Albumin: 4.2 g/dL (ref 3.5–5.2)
Alk Phosphatase: 73 U/L (ref 35–117)
Anion Gap: 14 mmol/L (ref 2–17)
BUN: 11 mg/dL (ref 8–23)
CO2: 25 mmol/L (ref 22–29)
Calcium: 9.4 mg/dL (ref 8.5–10.7)
Chloride: 105 mmol/L (ref 98–107)
Creatinine: 0.8 mg/dL (ref 0.5–1.0)
Est, Glom Filt Rate: 77 mL/min/1.73m?? (ref >=60)
Globulin: 2.5 g/dL (ref 1.9–4.4)
Glucose: 97 mg/dL (ref 70–99)
Osmolaliy Calculated: 286 mOsm/kg (ref 270–287)
Potassium: 4.3 mmol/L (ref 3.5–5.3)
Sodium: 144 mmol/L (ref 135–145)
Total Bilirubin: 0.72 mg/dL (ref 0.00–1.20)
Total Protein: 6.8 g/dL (ref 5.7–8.3)

## 2022-10-20 LAB — LIPID PANEL
Chol/HDL Ratio: 4.4 (ref 0.0–4.4)
Cholesterol, Total: 253 mg/dL — ABNORMAL HIGH (ref 100–200)
HDL: 58 mg/dL (ref >=50)
LDL Cholesterol: 170 mg/dL — ABNORMAL HIGH (ref 0.0–100.0)
LDL/HDL Ratio: 3
Triglycerides: 125 mg/dL (ref 0–149)
VLDL: 25 mg/dL (ref 5.0–40.0)

## 2022-10-20 NOTE — Other (Signed)
Results noted. Message sent to patient via MyChart.

## 2022-10-24 DIAGNOSIS — J383 Other diseases of vocal cords: Secondary | ICD-10-CM | POA: Diagnosis not present

## 2022-10-24 NOTE — Addendum Note (Signed)
Addended by: Christophe Louis on: 10/24/2022 09:41 AM     Modules accepted: Orders

## 2022-10-28 ENCOUNTER — Other Ambulatory Visit: Payer: Self-pay

## 2022-10-28 ENCOUNTER — Emergency Department (HOSPITAL_COMMUNITY)
Admission: EM | Admit: 2022-10-28 | Discharge: 2022-10-28 | Disposition: A | Discharge status: Home / Self Care | Payer: Medicare PPO | Source: Home / Self Care | Attending: Emergency Medicine | Admitting: Emergency Medicine

## 2022-10-28 DIAGNOSIS — E876 Hypokalemia: Secondary | ICD-10-CM | POA: Diagnosis not present

## 2022-10-28 DIAGNOSIS — Z79899 Other long term (current) drug therapy: Secondary | ICD-10-CM | POA: Diagnosis not present

## 2022-10-28 DIAGNOSIS — Z888 Allergy status to other drugs, medicaments and biological substances status: Secondary | ICD-10-CM | POA: Diagnosis not present

## 2022-10-28 DIAGNOSIS — M1990 Unspecified osteoarthritis, unspecified site: Secondary | ICD-10-CM | POA: Diagnosis present

## 2022-10-28 DIAGNOSIS — D61818 Other pancytopenia: Secondary | ICD-10-CM | POA: Diagnosis present

## 2022-10-28 DIAGNOSIS — R112 Nausea with vomiting, unspecified: Secondary | ICD-10-CM | POA: Insufficient documentation

## 2022-10-28 DIAGNOSIS — Z8249 Family history of ischemic heart disease and other diseases of the circulatory system: Secondary | ICD-10-CM | POA: Diagnosis not present

## 2022-10-28 DIAGNOSIS — M8580 Other specified disorders of bone density and structure, unspecified site: Secondary | ICD-10-CM | POA: Diagnosis present

## 2022-10-28 DIAGNOSIS — N179 Acute kidney failure, unspecified: Secondary | ICD-10-CM | POA: Diagnosis present

## 2022-10-28 DIAGNOSIS — A029 Salmonella infection, unspecified: Secondary | ICD-10-CM | POA: Diagnosis present

## 2022-10-28 DIAGNOSIS — Z87891 Personal history of nicotine dependence: Secondary | ICD-10-CM | POA: Diagnosis not present

## 2022-10-28 DIAGNOSIS — E860 Dehydration: Secondary | ICD-10-CM | POA: Diagnosis present

## 2022-10-28 DIAGNOSIS — D703 Neutropenia due to infection: Secondary | ICD-10-CM | POA: Diagnosis not present

## 2022-10-28 DIAGNOSIS — A020 Salmonella enteritis: Secondary | ICD-10-CM | POA: Diagnosis not present

## 2022-10-28 DIAGNOSIS — J45909 Unspecified asthma, uncomplicated: Secondary | ICD-10-CM | POA: Diagnosis present

## 2022-10-28 DIAGNOSIS — F32A Depression, unspecified: Secondary | ICD-10-CM | POA: Diagnosis present

## 2022-10-28 DIAGNOSIS — R197 Diarrhea, unspecified: Secondary | ICD-10-CM | POA: Insufficient documentation

## 2022-10-28 DIAGNOSIS — I10 Essential (primary) hypertension: Secondary | ICD-10-CM | POA: Diagnosis not present

## 2022-10-28 DIAGNOSIS — Z885 Allergy status to narcotic agent status: Secondary | ICD-10-CM | POA: Diagnosis not present

## 2022-10-28 DIAGNOSIS — Z7982 Long term (current) use of aspirin: Secondary | ICD-10-CM | POA: Diagnosis not present

## 2022-10-28 DIAGNOSIS — E785 Hyperlipidemia, unspecified: Secondary | ICD-10-CM | POA: Diagnosis present

## 2022-10-28 DIAGNOSIS — Z96643 Presence of artificial hip joint, bilateral: Secondary | ICD-10-CM | POA: Diagnosis present

## 2022-10-28 DIAGNOSIS — K219 Gastro-esophageal reflux disease without esophagitis: Secondary | ICD-10-CM | POA: Diagnosis present

## 2022-10-28 DIAGNOSIS — R1111 Vomiting without nausea: Secondary | ICD-10-CM | POA: Diagnosis not present

## 2022-10-28 DIAGNOSIS — Z85828 Personal history of other malignant neoplasm of skin: Secondary | ICD-10-CM | POA: Diagnosis not present

## 2022-10-28 DIAGNOSIS — D696 Thrombocytopenia, unspecified: Secondary | ICD-10-CM | POA: Diagnosis not present

## 2022-10-28 LAB — COMPREHENSIVE METABOLIC PANEL
ALT: 15 U/L (ref 0–44)
AST: 26 U/L (ref 15–41)
Albumin: 4.1 g/dL (ref 3.5–5.0)
Alkaline Phosphatase: 44 U/L (ref 38–126)
Anion gap: 14 (ref 5–15)
BUN: 28 mg/dL — ABNORMAL HIGH (ref 8–23)
CO2: 20 mmol/L — ABNORMAL LOW (ref 22–32)
Calcium: 9.1 mg/dL (ref 8.9–10.3)
Chloride: 103 mmol/L (ref 98–111)
Creatinine, Ser: 1.58 mg/dL — ABNORMAL HIGH (ref 0.44–1.00)
GFR, Estimated: 34 mL/min — ABNORMAL LOW (ref >60)
Glucose, Bld: 84 mg/dL (ref 70–99)
Potassium: 3.7 mmol/L (ref 3.5–5.1)
Sodium: 137 mmol/L (ref 135–145)
Total Bilirubin: 1 mg/dL (ref 0.3–1.2)
Total Protein: 7.6 g/dL (ref 6.5–8.1)

## 2022-10-28 LAB — CBC
HCT: 37.6 % (ref 36.0–46.0)
Hemoglobin: 12 g/dL (ref 12.0–15.0)
MCH: 28.4 pg (ref 26.0–34.0)
MCHC: 31.9 g/dL (ref 30.0–36.0)
MCV: 88.9 fL (ref 80.0–100.0)
Platelets: 144 10*3/uL — ABNORMAL LOW (ref 150–400)
RBC: 4.23 MIL/uL (ref 3.87–5.11)
RDW: 12.8 % (ref 11.5–15.5)
WBC: 5.7 10*3/uL (ref 4.0–10.5)
nRBC: 0 % (ref 0.0–0.2)

## 2022-10-28 LAB — URINALYSIS, ROUTINE W REFLEX MICROSCOPIC
Bacteria, UA: NONE SEEN
Bilirubin Urine: NEGATIVE
Glucose, UA: NEGATIVE mg/dL
Ketones, ur: 5 mg/dL — AB
Nitrite: NEGATIVE
Protein, ur: 30 mg/dL — AB
Specific Gravity, Urine: 1.021 (ref 1.005–1.030)
pH: 5 (ref 5.0–8.0)

## 2022-10-28 LAB — LIPASE, BLOOD: Lipase: 29 U/L (ref 11–51)

## 2022-10-28 MED ORDER — ONDANSETRON HCL 4 MG/2ML IJ SOLN
4.0000 mg | Freq: Once | INTRAMUSCULAR | Status: AC
  Administered 2022-10-28: 4 mg via INTRAVENOUS
  Filled 2022-10-28: qty 2

## 2022-10-28 MED ORDER — ONDANSETRON HCL 4 MG PO TABS: 4.0000 mg | ORAL_TABLET | Freq: Four times a day (QID) | ORAL | 0 refills | Status: DC

## 2022-10-28 MED ORDER — LACTATED RINGERS IV BOLUS
1000.0000 mL | Freq: Once | INTRAVENOUS | Status: AC
  Administered 2022-10-28: 1000 mL via INTRAVENOUS

## 2022-10-28 NOTE — ED Triage Notes (Signed)
Pt arrived via POV. C/o N/V/D for 3x days. No known sick contacts.  2x neg covid tests  AOX4

## 2022-10-28 NOTE — ED Provider Notes (Signed)
[place] EMERGENCY DEPARTMENT AT [facility] Provider Note   CSN: [id number] Arrival date & time: 10/28/22  1635     History Chief Complaint  Patient presents with   Nausea   Emesis   Diarrhea    HPI Maria Schneider is a 77 y.o. female presenting for nausea vomiting diarrhea for the 12 hours.  Acutely worsened over the last 6.  About 8 episodes of nonbilious nonbloody diarrhea 1 episode of nonbilious nonbloody vomiting.  Had a fever earlier today as well.  Denies any abdominal pain at any time.  Already starting to have restoration of normal hunger.  Symptomatically improving but feeling dehydrated today.  Patient's recorded medical, surgical, social, medication list and allergies were reviewed in the Snapshot window as part of the initial history.   Review of Systems   Review of Systems  Constitutional:  Negative for chills and fever.  HENT:  Negative for ear pain and sore throat.   Eyes:  Negative for pain and visual disturbance.  Respiratory:  Negative for cough and shortness of breath.   Cardiovascular:  Negative for chest pain and palpitations.  Gastrointestinal:  Positive for abdominal pain and nausea. Negative for vomiting.  Genitourinary:  Negative for dysuria and hematuria.  Musculoskeletal:  Negative for arthralgias and back pain.  Skin:  Negative for color change and rash.  Neurological:  Negative for seizures and syncope.  All other systems reviewed and are negative.   Physical Exam Updated Vital Signs BP (!) 145/76   Pulse 90   Temp 98.4 F (36.9 C) (Oral)   Resp 18   Ht 5\' 5"  (1.651 m)   Wt 73.5 kg   SpO2 96%   BMI 26.96 kg/m  Physical Exam Vitals and nursing note reviewed.  Constitutional:      General: She is not in acute distress.    Appearance: She is well-developed.  HENT:     Head: Normocephalic and atraumatic.  Eyes:     Conjunctiva/sclera: Conjunctivae normal.  Cardiovascular:     Rate and Rhythm: Normal rate and regular  rhythm.     Heart sounds: No murmur heard. Pulmonary:     Effort: Pulmonary effort is normal. No respiratory distress.     Breath sounds: Normal breath sounds.  Abdominal:     General: There is no distension.     Palpations: Abdomen is soft.     Tenderness: There is no abdominal tenderness. There is no right CVA tenderness or left CVA tenderness.  Musculoskeletal:        General: No swelling or tenderness. Normal range of motion.     Cervical back: Neck supple.  Skin:    General: Skin is warm and dry.  Neurological:     General: No focal deficit present.     Mental Status: She is alert and oriented to person, place, and time. Mental status is at baseline.     Cranial Nerves: No cranial nerve deficit.      ED Course/ Medical Decision Making/ A&P    Procedures Procedures   Medications Ordered in ED Medications  lactated ringers bolus 1,000 mL (0 mLs Intravenous Stopped 10/28/22 2114)  ondansetron (ZOFRAN) injection 4 mg (4 mg Intravenous Given 10/28/22 1955)    Medical Decision Making:    Maria Schneider is a 77 y.o. female who presented to the ED today with chief complaint of nausea vomiting diarrhea for the past 12 hours detailed above.    Complete initial physical exam performed,  notably the patient  was hemodynamically stable no acute distress.      Reviewed and confirmed nursing documentation for past medical history, family history, social history.    Initial Assessment:   With the patient's presentation of nausea, vomiting, diarrhea without abdominal pain, most likely diagnosis is nonspecific etiology most likely gastroenteritis. Other diagnoses were considered including (but not limited to) appendicitis, cholecystitis, small bowel obstruction. These are considered less likely due to history of present illness and physical exam findings.   This is most consistent with an acute life/limb threatening illness complicated by underlying chronic conditions.  Initial Plan:   Screening labs including CBC and Metabolic panel to evaluate for infectious or metabolic etiology of disease.  Urinalysis with reflex culture ordered to evaluate for UTI or relevant urologic/nephrologic pathology.  Objective evaluation as below reviewed with plan for close reassessment  Initial Study Results:   Laboratory  All laboratory results reviewed without evidence of clinically relevant pathology.    Reassessment and Plan:   Reassessed patient at bedside and symptoms are grossly in no acute distress.  She does have a slight AKI but it is subacute in nature. IV fluids ordered as well as Zofran for treatment. She is symptomatically improved on serial reassessment. Offered p.o. rehydration emergency room patient is symptomatically improved. Discussed with the patient and she feels comfort with outpatient care management given today's results.  She will need repeat blood work within 72 hours to ensure clearance of her BUN.  Strict return precaution and clinical overlap with appendicitis and diverticulitis reinforced with the patient.  Disposition:  I have considered need for hospitalization, however, considering all of the above, I believe this patient is stable for discharge at this time.  Patient/family educated about specific return precautions for given chief complaint and symptoms.  Patient/family educated about follow-up with PCP.     Patient/family expressed understanding of return precautions and need for follow-up. Patient spoken to regarding all imaging and laboratory results and appropriate follow up for these results. All education provided in verbal form with additional information in written form. Time was allowed for answering of patient questions. Patient discharged.    Emergency Department Medication Summary:   Medications  lactated ringers bolus 1,000 mL (0 mLs Intravenous Stopped 10/28/22 2114)  ondansetron (ZOFRAN) injection 4 mg (4 mg Intravenous Given 10/28/22 1955)       Final Clinical Impression(s) / ED Diagnoses Final diagnoses:  Nausea vomiting and diarrhea  Dehydration    Rx / DC Orders ED Discharge Orders          Ordered    ondansetron (ZOFRAN) 4 MG tablet  Every 6 hours        10/28/22 2305              Glyn Ade, MD 10/28/22 2333

## 2022-10-29 ENCOUNTER — Other Ambulatory Visit: Payer: Self-pay

## 2022-10-29 ENCOUNTER — Encounter (HOSPITAL_COMMUNITY): Payer: Self-pay

## 2022-10-29 ENCOUNTER — Inpatient Hospital Stay (HOSPITAL_COMMUNITY)
Admission: EM | Admit: 2022-10-29 | Discharge: 2022-11-02 | DRG: 868 | Disposition: A | Discharge status: Home / Self Care | Payer: Medicare PPO | Attending: Internal Medicine | Admitting: Internal Medicine

## 2022-10-29 DIAGNOSIS — Z79899 Other long term (current) drug therapy: Secondary | ICD-10-CM

## 2022-10-29 DIAGNOSIS — M1990 Unspecified osteoarthritis, unspecified site: Secondary | ICD-10-CM | POA: Diagnosis present

## 2022-10-29 DIAGNOSIS — R197 Diarrhea, unspecified: Secondary | ICD-10-CM | POA: Diagnosis not present

## 2022-10-29 DIAGNOSIS — D61818 Other pancytopenia: Secondary | ICD-10-CM | POA: Diagnosis present

## 2022-10-29 DIAGNOSIS — Z888 Allergy status to other drugs, medicaments and biological substances status: Secondary | ICD-10-CM

## 2022-10-29 DIAGNOSIS — I10 Essential (primary) hypertension: Secondary | ICD-10-CM | POA: Diagnosis present

## 2022-10-29 DIAGNOSIS — Z7982 Long term (current) use of aspirin: Secondary | ICD-10-CM

## 2022-10-29 DIAGNOSIS — R112 Nausea with vomiting, unspecified: Secondary | ICD-10-CM | POA: Diagnosis present

## 2022-10-29 DIAGNOSIS — J45909 Unspecified asthma, uncomplicated: Secondary | ICD-10-CM | POA: Diagnosis present

## 2022-10-29 DIAGNOSIS — Z885 Allergy status to narcotic agent status: Secondary | ICD-10-CM

## 2022-10-29 DIAGNOSIS — E860 Dehydration: Secondary | ICD-10-CM | POA: Diagnosis present

## 2022-10-29 DIAGNOSIS — K219 Gastro-esophageal reflux disease without esophagitis: Secondary | ICD-10-CM | POA: Diagnosis present

## 2022-10-29 DIAGNOSIS — D72819 Decreased white blood cell count, unspecified: Secondary | ICD-10-CM | POA: Diagnosis present

## 2022-10-29 DIAGNOSIS — E876 Hypokalemia: Secondary | ICD-10-CM | POA: Diagnosis not present

## 2022-10-29 DIAGNOSIS — A029 Salmonella infection, unspecified: Principal | ICD-10-CM | POA: Diagnosis present

## 2022-10-29 DIAGNOSIS — N179 Acute kidney failure, unspecified: Secondary | ICD-10-CM | POA: Diagnosis present

## 2022-10-29 DIAGNOSIS — Z87891 Personal history of nicotine dependence: Secondary | ICD-10-CM

## 2022-10-29 DIAGNOSIS — E785 Hyperlipidemia, unspecified: Secondary | ICD-10-CM | POA: Diagnosis present

## 2022-10-29 DIAGNOSIS — Z96643 Presence of artificial hip joint, bilateral: Secondary | ICD-10-CM | POA: Diagnosis present

## 2022-10-29 DIAGNOSIS — D696 Thrombocytopenia, unspecified: Secondary | ICD-10-CM | POA: Diagnosis present

## 2022-10-29 DIAGNOSIS — F32A Depression, unspecified: Secondary | ICD-10-CM | POA: Diagnosis present

## 2022-10-29 DIAGNOSIS — Z8249 Family history of ischemic heart disease and other diseases of the circulatory system: Secondary | ICD-10-CM

## 2022-10-29 DIAGNOSIS — M8580 Other specified disorders of bone density and structure, unspecified site: Secondary | ICD-10-CM | POA: Diagnosis present

## 2022-10-29 DIAGNOSIS — Z85828 Personal history of other malignant neoplasm of skin: Secondary | ICD-10-CM

## 2022-10-29 LAB — CBC WITH DIFFERENTIAL/PLATELET
Abs Immature Granulocytes: 0 10*3/uL (ref 0.00–0.07)
Basophils Absolute: 0 10*3/uL (ref 0.0–0.1)
Basophils Relative: 0 %
Eosinophils Absolute: 0 10*3/uL (ref 0.0–0.5)
Eosinophils Relative: 1 %
HCT: 34.1 % — ABNORMAL LOW (ref 36.0–46.0)
Hemoglobin: 10.7 g/dL — ABNORMAL LOW (ref 12.0–15.0)
Immature Granulocytes: 0 %
Lymphocytes Relative: 9 %
Lymphs Abs: 0.3 10*3/uL — ABNORMAL LOW (ref 0.7–4.0)
MCH: 28 pg (ref 26.0–34.0)
MCHC: 31.4 g/dL (ref 30.0–36.0)
MCV: 89.3 fL (ref 80.0–100.0)
Monocytes Absolute: 0.4 10*3/uL (ref 0.1–1.0)
Monocytes Relative: 12 %
Neutro Abs: 2.5 10*3/uL (ref 1.7–7.7)
Neutrophils Relative %: 78 %
Platelets: 106 10*3/uL — ABNORMAL LOW (ref 150–400)
RBC: 3.82 MIL/uL — ABNORMAL LOW (ref 3.87–5.11)
RDW: 12.6 % (ref 11.5–15.5)
WBC: 3.1 10*3/uL — ABNORMAL LOW (ref 4.0–10.5)
nRBC: 0 % (ref 0.0–0.2)

## 2022-10-29 LAB — COMPREHENSIVE METABOLIC PANEL
ALT: 17 U/L (ref 0–44)
AST: 28 U/L (ref 15–41)
Albumin: 3.6 g/dL (ref 3.5–5.0)
Alkaline Phosphatase: 42 U/L (ref 38–126)
Anion gap: 15 (ref 5–15)
BUN: 28 mg/dL — ABNORMAL HIGH (ref 8–23)
CO2: 19 mmol/L — ABNORMAL LOW (ref 22–32)
Calcium: 8.7 mg/dL — ABNORMAL LOW (ref 8.9–10.3)
Chloride: 101 mmol/L (ref 98–111)
Creatinine, Ser: 1.45 mg/dL — ABNORMAL HIGH (ref 0.44–1.00)
GFR, Estimated: 38 mL/min — ABNORMAL LOW (ref >60)
Glucose, Bld: 77 mg/dL (ref 70–99)
Potassium: 3.4 mmol/L — ABNORMAL LOW (ref 3.5–5.1)
Sodium: 135 mmol/L (ref 135–145)
Total Bilirubin: 1 mg/dL (ref 0.3–1.2)
Total Protein: 6.5 g/dL (ref 6.5–8.1)

## 2022-10-29 LAB — CREATININE, SERUM
Creatinine, Ser: 1.4 mg/dL — ABNORMAL HIGH (ref 0.44–1.00)
GFR, Estimated: 39 mL/min — ABNORMAL LOW (ref >60)

## 2022-10-29 LAB — CBC
HCT: 29.6 % — ABNORMAL LOW (ref 36.0–46.0)
Hemoglobin: 9.7 g/dL — ABNORMAL LOW (ref 12.0–15.0)
MCH: 28.8 pg (ref 26.0–34.0)
MCHC: 32.8 g/dL (ref 30.0–36.0)
MCV: 87.8 fL (ref 80.0–100.0)
Platelets: 104 10*3/uL — ABNORMAL LOW (ref 150–400)
RBC: 3.37 MIL/uL — ABNORMAL LOW (ref 3.87–5.11)
RDW: 12.6 % (ref 11.5–15.5)
WBC: 2.8 10*3/uL — ABNORMAL LOW (ref 4.0–10.5)
nRBC: 0 % (ref 0.0–0.2)

## 2022-10-29 MED ORDER — ONDANSETRON HCL 4 MG PO TABS: 4.0000 mg | ORAL_TABLET | Freq: Four times a day (QID) | ORAL | Status: DC | PRN

## 2022-10-29 MED ORDER — MIRABEGRON ER 50 MG PO TB24
50.0000 mg | ORAL_TABLET | Freq: Every day | ORAL | Status: DC
  Administered 2022-10-30 – 2022-11-02 (×4): 50 mg via ORAL
  Filled 2022-10-29 (×4): qty 1

## 2022-10-29 MED ORDER — SERTRALINE HCL 100 MG PO TABS
200.0000 mg | ORAL_TABLET | Freq: Every day | ORAL | Status: DC
  Administered 2022-10-29 – 2022-11-02 (×5): 200 mg via ORAL
  Filled 2022-10-29 (×5): qty 2

## 2022-10-29 MED ORDER — KCL IN DEXTROSE-NACL 20-5-0.9 MEQ/L-%-% IV SOLN
INTRAVENOUS | Status: DC
  Filled 2022-10-29 (×9): qty 1000

## 2022-10-29 MED ORDER — ENOXAPARIN SODIUM 40 MG/0.4ML IJ SOSY
40.0000 mg | PREFILLED_SYRINGE | INTRAMUSCULAR | Status: DC
  Administered 2022-10-29 – 2022-11-01 (×4): 40 mg via SUBCUTANEOUS
  Filled 2022-10-29 (×4): qty 0.4

## 2022-10-29 MED ORDER — BUPROPION HCL ER (XL) 150 MG PO TB24
300.0000 mg | ORAL_TABLET | Freq: Every day | ORAL | Status: DC
  Administered 2022-10-29 – 2022-11-02 (×5): 300 mg via ORAL
  Filled 2022-10-29: qty 2
  Filled 2022-10-29: qty 1
  Filled 2022-10-29 (×4): qty 2

## 2022-10-29 MED ORDER — ONDANSETRON HCL 4 MG/2ML IJ SOLN: 4.0000 mg | Freq: Four times a day (QID) | INTRAMUSCULAR | Status: DC | PRN

## 2022-10-29 MED ORDER — METOPROLOL SUCCINATE ER 25 MG PO TB24
25.0000 mg | ORAL_TABLET | Freq: Every day | ORAL | Status: DC
  Administered 2022-10-30 – 2022-11-02 (×4): 25 mg via ORAL
  Filled 2022-10-29 (×4): qty 1

## 2022-10-29 MED ORDER — PANTOPRAZOLE SODIUM 40 MG PO TBEC
40.0000 mg | DELAYED_RELEASE_TABLET | Freq: Every day | ORAL | Status: DC
  Administered 2022-10-30 – 2022-11-02 (×4): 40 mg via ORAL
  Filled 2022-10-29 (×5): qty 1

## 2022-10-29 MED ORDER — LACTATED RINGERS IV BOLUS
1000.0000 mL | Freq: Once | INTRAVENOUS | Status: AC
  Administered 2022-10-29: 1000 mL via INTRAVENOUS

## 2022-10-29 NOTE — ED Notes (Signed)
ED TO INPATIENT HANDOFF REPORT  ED Nurse Name and Phone #: Steward Drone [phone]  S Name/Age/Gender Maria Schneider 77 y.o. female Room/Bed: 023C/023C  Code Status   Code Status: Full Code  Home/SNF/Other Home Patient oriented to: self, place, time, and situation Is this baseline? Yes   Triage Complete: Triage complete  Chief Complaint Nausea & vomiting [R11.2]  Triage Note PT BIB by [facility] from home.PT was seen at [facility] yesterday for v/d issues that had been happening since Thursday. PT was sent home on a light diet and was told if she can't hold that down to return. PT has had severe diarrhea and a little nausea   Allergies Allergies  Allergen Reactions   Ace Inhibitors     REACTION: cough   Codeine Nausea And Vomiting   Fentanyl Itching   Statins     REACTION: MYALGIAS-muscle pain    Level of Care/Admitting Diagnosis ED Disposition     ED Disposition  Admit   Condition  --   Comment  Hospital Area: [facility] [[id number]]  Level of Care: Med-Surg [16]  May place patient in observation at [facility] or [facility] if equivalent level of care is available:: No  Covid Evaluation: Asymptomatic - no recent exposure (last 10 days) testing not required  Diagnosis: Nausea & vomiting [277057]  Admitting Physician: Rometta Emery [[id number]]  Attending Physician: Rometta Emery [[id number]]          B Medical/Surgery History Past Medical History:  Diagnosis Date   Allergy    Arthritis    Asthma    Cancer (HCC)    hx skin cancer   Depression    Detrusor instability    Diverticulosis    not symptomatic   GERD (gastroesophageal reflux disease)    History of kidney stones    History of pancreatitis    April 2015   History of skin cancer    Hyperlipidemia    Hypertension    Osteopenia    Urinary incontinence    Vitamin D deficiency    RESOLVED    Past Surgical History:  Procedure Laterality Date   ABDOMINAL HYSTERECTOMY  03/20/1992   TAH/  BLADDER SUSPENSION   BREAST BIOPSY  03/20/1970   benign   CHOLECYSTECTOMY N/A 07/12/2013   Procedure: LAPAROSCOPIC CHOLECYSTECTOMY WITH INTRAOPERATIVE CHOLANGIOGRAM;  Surgeon: Mariella Saa, MD;  Location: [facility];  Service: General;  Laterality: N/A;   COLONOSCOPY  06/24/2012   2022   ERCP N/A 07/14/2013   Procedure: ENDOSCOPIC RETROGRADE CHOLANGIOPANCREATOGRAPHY (ERCP);  Surgeon: Iva Boop, MD;  Location: [facility];  Service: Endoscopy;  Laterality: N/A;   TONSILLECTOMY     TOTAL HIP ARTHROPLASTY Right 01/13/2015   Procedure: RIGHT TOTAL HIP ARTHROPLASTY ANTERIOR APPROACH;  Surgeon: Ollen Gross, MD;  Location: [facility];  Service: Orthopedics;  Laterality: Right;   TOTAL HIP ARTHROPLASTY Left 11/07/2017   Procedure: LEFT TOTAL HIP ARTHROPLASTY ANTERIOR APPROACH;  Surgeon: Ollen Gross, MD;  Location: [facility];  Service: Orthopedics;  Laterality: Left;   TUBAL LIGATION       A IV Location/Drains/Wounds Patient Lines/Drains/Airways Status     Active Line/Drains/Airways     Name Placement date Placement time Site Days   Peripheral IV 10/29/22 24 G Anterior;Left Forearm 10/29/22  1618  Forearm  less than 1            Intake/Output Last 24 hours No intake or output data in the 24 hours ending 10/29/22 1843  Labs/Imaging Results for orders placed or performed during the hospital encounter of 10/29/22 (from the past 48 hour(s))  Comprehensive metabolic panel     Status: Abnormal   Collection Time: 10/29/22  4:20 PM  Result Value Ref Range   Sodium 135 135 - 145 mmol/L   Potassium 3.4 (L) 3.5 - 5.1 mmol/L   Chloride 101 98 - 111 mmol/L   CO2 19 (L) 22 - 32 mmol/L   Glucose, Bld 77 70 - 99 mg/dL    Comment: Glucose reference range applies only to samples taken after fasting for at least 8 hours.   BUN 28 (H) 8 - 23 mg/dL   Creatinine, Ser 8.46 (H) 0.44 - 1.00 mg/dL   Calcium 8.7 (L) 8.9 - 10.3 mg/dL   Total Protein 6.5 6.5 - 8.1 g/dL   Albumin 3.6 3.5 - 5.0 g/dL    AST 28 15 - 41 U/L   ALT 17 0 - 44 U/L   Alkaline Phosphatase 42 38 - 126 U/L   Total Bilirubin 1.0 0.3 - 1.2 mg/dL   GFR, Estimated 38 (L) >60 mL/min    Comment: (NOTE) Calculated using the CKD-EPI Creatinine Equation (2021)    Anion gap 15 5 - 15    Comment: Performed at [facility], [address] [place], [place] [address]  CBC with Differential     Status: Abnormal   Collection Time: 10/29/22  4:20 PM  Result Value Ref Range   WBC 3.1 (L) 4.0 - 10.5 K/uL   RBC 3.82 (L) 3.87 - 5.11 MIL/uL   Hemoglobin 10.7 (L) 12.0 - 15.0 g/dL   HCT 28.4 (L) 13.2 - 44.0 %   MCV 89.3 80.0 - 100.0 fL   MCH 28.0 26.0 - 34.0 pg   MCHC 31.4 30.0 - 36.0 g/dL   RDW 10.2 72.5 - 36.6 %   Platelets 106 (L) 150 - 400 K/uL   nRBC 0.0 0.0 - 0.2 %   Neutrophils Relative % 78 %   Neutro Abs 2.5 1.7 - 7.7 K/uL   Lymphocytes Relative 9 %   Lymphs Abs 0.3 (L) 0.7 - 4.0 K/uL   Monocytes Relative 12 %   Monocytes Absolute 0.4 0.1 - 1.0 K/uL   Eosinophils Relative 1 %   Eosinophils Absolute 0.0 0.0 - 0.5 K/uL   Basophils Relative 0 %   Basophils Absolute 0.0 0.0 - 0.1 K/uL   Immature Granulocytes 0 %   Abs Immature Granulocytes 0.00 0.00 - 0.07 K/uL    Comment: Performed at [facility], [address] [place], [place] [address]   No results found.  Pending Labs [employer] (From admission, onward)     Start     Ordered   Signed and Held  CBC  (enoxaparin (LOVENOX)    CrCl >/= 30 ml/min)  Once,   R       Comments: Baseline for enoxaparin therapy IF NOT ALREADY DRAWN.  Notify MD if PLT < 100 K.    Signed and Held   Signed and Held  Creatinine, serum  (enoxaparin (LOVENOX)    CrCl >/= 30 ml/min)  Once,   R       Comments: Baseline for enoxaparin therapy IF NOT ALREADY DRAWN.    Signed and Held   Signed and Held  Creatinine, serum  (enoxaparin (LOVENOX)    CrCl >/= 30 ml/min)  Weekly,   R     Comments: while on enoxaparin therapy  Signed and Held   Signed and Held   Comprehensive metabolic panel  Tomorrow morning,   R        Signed and Held   Signed and Held  CBC  Tomorrow morning,   R        Signed and Held            Vitals/Pain Today's Vitals   10/29/22 1241 10/29/22 1524 10/29/22 1530 10/29/22 1830  BP:   (!) 134/56 (!) 139/57  Pulse:   86 85  Resp:   17 19  Temp:      TempSrc:      SpO2:   99% 97%  Weight:  73.5 kg    Height:  5\' 5"  (1.651 m)    PainSc: 0-No pain       Isolation Precautions No active isolations  Medications Medications  lactated ringers bolus 1,000 mL (1,000 mLs Intravenous New Bag/Given 10/29/22 1631)    Mobility walks     Focused Assessments abdominal   R Recommendations: See Admitting Provider Note  Report given to:   Additional Notes: Pt ao x 4.  Able to ambulate to toilet.     B

## 2022-10-29 NOTE — H&P (Signed)
History and Physical    Patient: Maria Schneider JYN:[id number] DOB: 05-10-1945 DOA: 10/29/2022 DOS: the patient was seen and examined on 10/29/2022 PCP: Nelwyn Salisbury, MD  Patient coming from: Home  Chief Complaint:  Chief Complaint  Patient presents with   Diarrhea   HPI: [name] is a 77 y.o. female with medical history significant of asthma, GERD, history of pancreatitis, urinary incontinence, essential hypertension and hyperlipidemia who was seen in the ER yesterday with Nausea vomiting and diarrhea.  Symptoms were going on for about 12 hours.  It worsened over the last 6 hours prior to coming in.  Patient reported having 8 episode of nonbilious nonbloody diarrhea.  Also fever.  No abdominal pain at any time.  Patient responded to treatment in the ER and improved symptomatically.  She was subsequently discharged home.  Patient return today with worsening symptoms of diarrhea.  Also nausea.  Denied any recent antibiotic exposure.  Denied any sick contacts.  Patient continues to be significantly dehydrated with AKI and hypokalemia.  She is being admitted for further evaluation and treatment.  Review of Systems: As mentioned in the history of present illness. All other systems reviewed and are negative. Past Medical History:  Diagnosis Date   Allergy    Arthritis    Asthma    Cancer (HCC)    hx skin cancer   Depression    Detrusor instability    Diverticulosis    not symptomatic   GERD (gastroesophageal reflux disease)    History of kidney stones    History of pancreatitis    April 2015   History of skin cancer    Hyperlipidemia    Hypertension    Osteopenia    Urinary incontinence    Vitamin D deficiency    RESOLVED    Past Surgical History:  Procedure Laterality Date   ABDOMINAL HYSTERECTOMY  03/20/1992   TAH/ BLADDER SUSPENSION   BREAST BIOPSY  03/20/1970   benign   CHOLECYSTECTOMY N/A 07/12/2013   Procedure: LAPAROSCOPIC CHOLECYSTECTOMY WITH INTRAOPERATIVE  CHOLANGIOGRAM;  Surgeon: Mariella Saa, MD;  Location: [facility];  Service: General;  Laterality: N/A;   COLONOSCOPY  06/24/2012   2022   ERCP N/A 07/14/2013   Procedure: ENDOSCOPIC RETROGRADE CHOLANGIOPANCREATOGRAPHY (ERCP);  Surgeon: Iva Boop, MD;  Location: [facility];  Service: Endoscopy;  Laterality: N/A;   TONSILLECTOMY     TOTAL HIP ARTHROPLASTY Right 01/13/2015   Procedure: RIGHT TOTAL HIP ARTHROPLASTY ANTERIOR APPROACH;  Surgeon: Ollen Gross, MD;  Location: [facility];  Service: Orthopedics;  Laterality: Right;   TOTAL HIP ARTHROPLASTY Left 11/07/2017   Procedure: LEFT TOTAL HIP ARTHROPLASTY ANTERIOR APPROACH;  Surgeon: Ollen Gross, MD;  Location: [facility];  Service: Orthopedics;  Laterality: Left;   TUBAL LIGATION     Social History:  reports that she quit smoking about 14 years ago. Her smoking use included cigarettes. She started smoking about 56 years ago. She has never used smokeless tobacco. She reports current alcohol use of about 2.0 standard drinks of alcohol per week. She reports that she does not use drugs.  Allergies  Allergen Reactions   Ace Inhibitors     REACTION: cough   Codeine Nausea And Vomiting   Fentanyl Itching   Statins     REACTION: MYALGIAS-muscle pain    Family History  Problem Relation Age of Onset   Hypertension Mother    Osteoporosis Mother    Heart disease Father    Osteoporosis Sister  Breast cancer Paternal Aunt        Age 29's   Diabetes Paternal Uncle     Prior to Admission medications   Medication Sig Start Date End Date Taking? Authorizing Provider  aspirin EC 81 MG tablet Take 1 tablet (81 mg total) by mouth daily. Swallow whole. 11/14/19   Parke Poisson, MD  buPROPion (WELLBUTRIN XL) 300 MG 24 hr tablet TAKE 1 TABLET BY MOUTH EVERY DAY 10/12/21   Nelwyn Salisbury, MD  Cholecalciferol (VITAMIN D3) 25 MCG (1000 UT) CAPS Take 2,000 Units by mouth daily.    [provider]  esomeprazole (NEXIUM) 20 MG capsule  Take 1 capsule (20 mg total) by mouth 2 (two) times daily before a meal. 05/16/22   Nelwyn Salisbury, MD  Evolocumab (REPATHA SURECLICK) 140 MG/ML SOAJ Inject 140 mg into the skin every 14 (fourteen) days. 06/02/22   Parke Poisson, MD  ezetimibe (ZETIA) 10 MG tablet TAKE 1 TABLET BY MOUTH EVERY DAY 10/19/22   Nelwyn Salisbury, MD  fenofibrate 160 MG tablet TAKE 1 TABLET EVERY DAY 09/18/22   Nelwyn Salisbury, MD  metoprolol succinate (TOPROL-XL) 25 MG 24 hr tablet TAKE 1 TABLET (25 MG TOTAL) BY MOUTH DAILY. 09/04/22   Nelwyn Salisbury, MD  MYRBETRIQ 50 MG TB24 tablet Take 50 mg by mouth daily. 03/02/16   [provider]  ondansetron (ZOFRAN) 4 MG tablet Take 1 tablet (4 mg total) by mouth every 6 (six) hours. 10/28/22   Glyn Ade, MD  sertraline (ZOLOFT) 100 MG tablet TAKE 2 TABLETS BY MOUTH EVERY DAY 10/02/22   Nelwyn Salisbury, MD  valsartan (DIOVAN) 80 MG tablet TAKE 1 TABLET BY MOUTH EVERY DAY 08/28/22   Nelwyn Salisbury, MD    Physical Exam: Vitals:   10/29/22 1238 10/29/22 1524 10/29/22 1530  BP: (!) 146/77  (!) 134/56  Pulse: 88  86  Resp: 18  17  Temp: 98.7 F (37.1 C)    TempSrc: Oral    SpO2: 98%  99%  Weight:  73.5 kg   Height:  5\' 5"  (1.651 m)    Constitutional: NAD, calm, comfortable Eyes: PERRL, lids and conjunctivae normal ENMT: Mucous membranes are dry. Posterior pharynx clear of any exudate or lesions.Normal dentition.  Neck: normal, supple, no masses, no thyromegaly Respiratory: clear to auscultation bilaterally, no wheezing, no crackles. Normal respiratory effort. No accessory muscle use.  Cardiovascular: Regular rate and rhythm, no murmurs / rubs / gallops. No extremity edema. 2+ pedal pulses. No carotid bruits.  Abdomen: no tenderness, no masses palpated. No hepatosplenomegaly. Bowel sounds positive.  Musculoskeletal: Good range of motion, no joint swelling or tenderness, Skin: no rashes, lesions, ulcers. No induration Neurologic: CN 2-12 grossly intact. Sensation  intact, DTR normal. Strength 5/5 in all 4.  Psychiatric: Normal judgment and insight. Alert and oriented x 3. Normal mood  Data Reviewed:  Blood pressure 146/77, potassium 3.4 creatinine 1.45 calcium 8.7 and CO2 19.  White count 3.1 and platelets 106.  Assessment and Plan:  #1 intractable nausea vomiting and diarrhea: Most likely some type of gastro enteritis.  Stool studies will be sent.  Symptomatic treatment.  IV fluids.  Nausea medicine control with Zofran.  #2 hypokalemia: Most likely secondary to nausea vomiting and diarrhea..  Potassium  #3 thrombocytopenia: Monitor platelets and repeat test.  This is new from yesterday.  No bleeding observed.  #4 leukopenia: Monitor white count  #5 GERD: Continue PPIs  #6 essential hypertension: Continue  blood pressure monitoring.  #7 hyperlipidemia: Continue with statin patient on Crestor.    Advance Care Planning:   Code Status: Full Code   Consults: None  Family Communication: No family at bedside  Severity of Illness: The appropriate patient status for this patient is OBSERVATION. Observation status is judged to be reasonable and necessary in order to provide the required intensity of service to ensure the patient's safety. The patient's presenting symptoms, physical exam findings, and initial radiographic and laboratory data in the context of their medical condition is felt to place them at decreased risk for further clinical deterioration. Furthermore, it is anticipated that the patient will be medically stable for discharge from the hospital within 2 midnights of admission.   AuthorLonia Blood, MD 10/29/2022 6:32 PM  For on call review [email or web address].

## 2022-10-29 NOTE — ED Notes (Signed)
Pt assisted with using the bedpan.

## 2022-10-29 NOTE — ED Triage Notes (Addendum)
PT BIB by [facility] from home.PT was seen at [facility] yesterday for v/d issues that had been happening since Thursday. PT was sent home on a light diet and was told if she can't hold that down to return. PT has had severe diarrhea and a little nausea

## 2022-10-29 NOTE — ED Notes (Signed)
 IV team consult put in

## 2022-10-29 NOTE — ED Provider Notes (Signed)
[place] EMERGENCY DEPARTMENT AT [facility] Provider Note   CSN: [id number] Arrival date & time: 10/29/22  1229     History  Chief Complaint  Patient presents with   Diarrhea    Maria Schneider is a 77 y.o. female.  Patient is a 77 year old female who presents with diarrhea.  She states 2 days ago she started having some large amounts of watery diarrhea associated with some intermittent nausea and vomiting.  She denies abdominal pain.  She has had some fevers up to 102.  No cough or cold symptoms.  No urinary symptoms.  She was seen yesterday and hydrated.  She was told to come back if she is getting worse and she does not feel like she is able to keep anything down.  She says anytime she tries to eat or drink anything or stand up she has profuse watery diarrhea.  She denies any recent antibiotic use.  No recent travel history.       Home Medications Prior to Admission medications   Medication Sig Start Date End Date Taking? Authorizing Provider  aspirin EC 81 MG tablet Take 1 tablet (81 mg total) by mouth daily. Swallow whole. 11/14/19   Parke Poisson, MD  buPROPion (WELLBUTRIN XL) 300 MG 24 hr tablet TAKE 1 TABLET BY MOUTH EVERY DAY 10/12/21   Nelwyn Salisbury, MD  Cholecalciferol (VITAMIN D3) 25 MCG (1000 UT) CAPS Take 2,000 Units by mouth daily.    [provider]  esomeprazole (NEXIUM) 20 MG capsule Take 1 capsule (20 mg total) by mouth 2 (two) times daily before a meal. 05/16/22   Nelwyn Salisbury, MD  Evolocumab (REPATHA SURECLICK) 140 MG/ML SOAJ Inject 140 mg into the skin every 14 (fourteen) days. 06/02/22   Parke Poisson, MD  ezetimibe (ZETIA) 10 MG tablet TAKE 1 TABLET BY MOUTH EVERY DAY 10/19/22   Nelwyn Salisbury, MD  fenofibrate 160 MG tablet TAKE 1 TABLET EVERY DAY 09/18/22   Nelwyn Salisbury, MD  metoprolol succinate (TOPROL-XL) 25 MG 24 hr tablet TAKE 1 TABLET (25 MG TOTAL) BY MOUTH DAILY. 09/04/22   Nelwyn Salisbury, MD  MYRBETRIQ 50 MG TB24 tablet  Take 50 mg by mouth daily. 03/02/16   [provider]  ondansetron (ZOFRAN) 4 MG tablet Take 1 tablet (4 mg total) by mouth every 6 (six) hours. 10/28/22   Glyn Ade, MD  sertraline (ZOLOFT) 100 MG tablet TAKE 2 TABLETS BY MOUTH EVERY DAY 10/02/22   Nelwyn Salisbury, MD  valsartan (DIOVAN) 80 MG tablet TAKE 1 TABLET BY MOUTH EVERY DAY 08/28/22   Nelwyn Salisbury, MD      Allergies    Ace inhibitors, Codeine, Fentanyl, and Statins    Review of Systems   Review of Systems  Constitutional:  Positive for fatigue and fever. Negative for chills and diaphoresis.  HENT:  Negative for congestion, rhinorrhea and sneezing.   Eyes: Negative.   Respiratory:  Negative for cough, chest tightness and shortness of breath.   Cardiovascular:  Negative for chest pain and leg swelling.  Gastrointestinal:  Positive for diarrhea, nausea and vomiting. Negative for abdominal pain and blood in stool.  Genitourinary:  Negative for difficulty urinating, flank pain, frequency and hematuria.  Musculoskeletal:  Negative for arthralgias and back pain.  Skin:  Negative for rash.  Neurological:  Negative for dizziness, speech difficulty, weakness, numbness and headaches.    Physical Exam Updated Vital Signs BP (!) 134/56   Pulse  86   Temp 98.7 F (37.1 C) (Oral)   Resp 17   Ht 5\' 5"  (1.651 m)   Wt 73.5 kg   SpO2 99%   BMI 26.96 kg/m  Physical Exam Constitutional:      Appearance: She is well-developed.  HENT:     Head: Normocephalic and atraumatic.  Eyes:     Pupils: Pupils are equal, round, and reactive to light.  Cardiovascular:     Rate and Rhythm: Normal rate and regular rhythm.     Heart sounds: Normal heart sounds.  Pulmonary:     Effort: Pulmonary effort is normal. No respiratory distress.     Breath sounds: Normal breath sounds. No wheezing or rales.  Chest:     Chest wall: No tenderness.  Abdominal:     General: Bowel sounds are normal.     Palpations: Abdomen is soft.      Tenderness: There is no abdominal tenderness. There is no guarding or rebound.  Musculoskeletal:        General: Normal range of motion.     Cervical back: Normal range of motion and neck supple.  Lymphadenopathy:     Cervical: No cervical adenopathy.  Skin:    General: Skin is warm and dry.     Findings: No rash.  Neurological:     Mental Status: She is alert and oriented to person, place, and time.     ED Results / Procedures / Treatments   Labs (all labs ordered are listed, but only abnormal results are displayed) Labs Reviewed  COMPREHENSIVE METABOLIC PANEL - Abnormal; Notable for the following components:      Result Value   Potassium 3.4 (*)    CO2 19 (*)    BUN 28 (*)    Creatinine, Ser 1.45 (*)    Calcium 8.7 (*)    GFR, Estimated 38 (*)    All other components within normal limits  CBC WITH DIFFERENTIAL/PLATELET - Abnormal; Notable for the following components:   WBC 3.1 (*)    RBC 3.82 (*)    Hemoglobin 10.7 (*)    HCT 34.1 (*)    Platelets 106 (*)    Lymphs Abs 0.3 (*)    All other components within normal limits    EKG None  Radiology No results found.  Procedures Procedures    Medications Ordered in ED Medications  lactated ringers bolus 1,000 mL (1,000 mLs Intravenous New Bag/Given 10/29/22 1631)    ED Course/ Medical Decision Making/ A&P                                 Medical Decision Making Amount and/or Complexity of Data Reviewed Labs: ordered.  Risk Decision regarding hospitalization.   Patient is a 77 year old female who presents with diarrhea.  She was seen yesterday and was going to do a trial of outpatient treatment but has not gotten any better and is having ongoing large-volume diarrhea.  Labs were reviewed and are nonconcerning.  She has no associate abdominal pain which would warrant abdominal imaging.  Given that just started yesterday, I have not ordered stool studies.  Discussed with Dr. Mikeal Hawthorne who will admit the  patient.  Final Clinical Impression(s) / ED Diagnoses Final diagnoses:  Diarrhea, unspecified type    Rx / DC Orders ED Discharge Orders     None         Rolan Bucco, MD 10/29/22 4132

## 2022-10-29 NOTE — Progress Notes (Signed)
Patient had 5 loose bowel movements since 7P. Howerter,MD notified via page.

## 2022-10-29 NOTE — ED Notes (Addendum)
This RN attempted to draw blood unsuccessfully twice.

## 2022-10-29 NOTE — ED Notes (Signed)
Patient was given a 1/2 cup ice.

## 2022-10-29 NOTE — Progress Notes (Signed)
[facility] night cross cover note:   I was notified by RN that this patient, who was admitted earlier this evening for nausea, vomiting, diarrhea, has had 5 episodes of loose stool since arriving at the hospital. I subsequently placed orders for the following stool studies: C. difficile by PCR as well as GI panel by PCR.    Newton Pigg, DO Hospitalist

## 2022-10-30 DIAGNOSIS — R197 Diarrhea, unspecified: Secondary | ICD-10-CM | POA: Diagnosis present

## 2022-10-30 DIAGNOSIS — R112 Nausea with vomiting, unspecified: Secondary | ICD-10-CM | POA: Diagnosis not present

## 2022-10-30 DIAGNOSIS — Z85828 Personal history of other malignant neoplasm of skin: Secondary | ICD-10-CM | POA: Diagnosis not present

## 2022-10-30 DIAGNOSIS — M8580 Other specified disorders of bone density and structure, unspecified site: Secondary | ICD-10-CM | POA: Diagnosis present

## 2022-10-30 DIAGNOSIS — Z79899 Other long term (current) drug therapy: Secondary | ICD-10-CM | POA: Diagnosis not present

## 2022-10-30 DIAGNOSIS — I10 Essential (primary) hypertension: Secondary | ICD-10-CM | POA: Diagnosis present

## 2022-10-30 DIAGNOSIS — E785 Hyperlipidemia, unspecified: Secondary | ICD-10-CM | POA: Diagnosis present

## 2022-10-30 DIAGNOSIS — N179 Acute kidney failure, unspecified: Secondary | ICD-10-CM | POA: Diagnosis present

## 2022-10-30 DIAGNOSIS — Z96643 Presence of artificial hip joint, bilateral: Secondary | ICD-10-CM | POA: Diagnosis present

## 2022-10-30 DIAGNOSIS — Z7982 Long term (current) use of aspirin: Secondary | ICD-10-CM | POA: Diagnosis not present

## 2022-10-30 DIAGNOSIS — D61818 Other pancytopenia: Secondary | ICD-10-CM | POA: Diagnosis present

## 2022-10-30 DIAGNOSIS — Z87891 Personal history of nicotine dependence: Secondary | ICD-10-CM | POA: Diagnosis not present

## 2022-10-30 DIAGNOSIS — Z888 Allergy status to other drugs, medicaments and biological substances status: Secondary | ICD-10-CM | POA: Diagnosis not present

## 2022-10-30 DIAGNOSIS — E876 Hypokalemia: Secondary | ICD-10-CM | POA: Diagnosis present

## 2022-10-30 DIAGNOSIS — F32A Depression, unspecified: Secondary | ICD-10-CM | POA: Diagnosis present

## 2022-10-30 DIAGNOSIS — E860 Dehydration: Secondary | ICD-10-CM | POA: Diagnosis present

## 2022-10-30 DIAGNOSIS — A029 Salmonella infection, unspecified: Secondary | ICD-10-CM | POA: Diagnosis present

## 2022-10-30 DIAGNOSIS — J45909 Unspecified asthma, uncomplicated: Secondary | ICD-10-CM | POA: Diagnosis present

## 2022-10-30 DIAGNOSIS — K219 Gastro-esophageal reflux disease without esophagitis: Secondary | ICD-10-CM | POA: Diagnosis present

## 2022-10-30 DIAGNOSIS — M1990 Unspecified osteoarthritis, unspecified site: Secondary | ICD-10-CM | POA: Diagnosis present

## 2022-10-30 DIAGNOSIS — Z885 Allergy status to narcotic agent status: Secondary | ICD-10-CM | POA: Diagnosis not present

## 2022-10-30 DIAGNOSIS — Z8249 Family history of ischemic heart disease and other diseases of the circulatory system: Secondary | ICD-10-CM | POA: Diagnosis not present

## 2022-10-30 MED ORDER — IRBESARTAN 75 MG PO TABS
75.0000 mg | ORAL_TABLET | Freq: Every day | ORAL | Status: DC
  Administered 2022-10-30 – 2022-11-02 (×4): 75 mg via ORAL
  Filled 2022-10-30 (×4): qty 1

## 2022-10-30 MED ORDER — EZETIMIBE 10 MG PO TABS
10.0000 mg | ORAL_TABLET | Freq: Every day | ORAL | Status: DC
  Administered 2022-10-30 – 2022-11-02 (×4): 10 mg via ORAL
  Filled 2022-10-30 (×4): qty 1

## 2022-10-30 MED ORDER — SODIUM CHLORIDE 0.9 % IV SOLN
2.0000 g | INTRAVENOUS | Status: DC
  Administered 2022-10-30 – 2022-10-31 (×2): 2 g via INTRAVENOUS
  Filled 2022-10-30 (×2): qty 20

## 2022-10-30 MED ORDER — FENOFIBRATE 160 MG PO TABS
160.0000 mg | ORAL_TABLET | Freq: Every day | ORAL | Status: DC
  Administered 2022-10-30 – 2022-11-02 (×4): 160 mg via ORAL
  Filled 2022-10-30 (×4): qty 1

## 2022-10-30 NOTE — Progress Notes (Signed)
[facility] lab called and said the GI panel showed simonella,  Dr Lowell Guitar made aware

## 2022-10-30 NOTE — Progress Notes (Signed)
PROGRESS NOTE    Maria Schneider  NWG:[id number] DOB: 1945/04/02 DOA: 10/29/2022 PCP: Nelwyn Salisbury, MD  Chief Complaint  Patient presents with   Diarrhea    Brief Narrative:    Maria Schneider is Maria Schneider 77 y.o. female with medical history significant of asthma, GERD, history of pancreatitis, urinary incontinence, essential hypertension and hyperlipidemia who was seen in the ER yesterday with Nausea vomiting and diarrhea.    Assessment & Plan:   Principal Problem:   Nausea & vomiting Active Problems:   Dyslipidemia   Essential hypertension   GERD   Hypokalemia   AKI (acute kidney injury) (HCC)   Thrombocytopenia (HCC)   Leucopenia  # Nausea  Vomiting  Diarrhea: Unclear cause at this point Primary symptom is diarrhea Negative c diff Pending GI pathogen panel  Supportive care with IVF - hold off on antidiarrheals until gi path results  If not resolving, consider CT abd/pelvis +/- GI consult   #2 hypokalemia: replace and follow   #3 thrombocytopenia: in setting of above? Will follow    #4 leukopenia: will follow    #5 GERD: Continue PPIs   #6 essential hypertension: Continue arb, metoprolol    #7 hyperlipidemia: zetia, fibrate, crestor     DVT prophylaxis: lovenox Code Status: full Family Communication: none Disposition:   Status is: Observation The patient remains OBS appropriate and will d/c before 2 midnights.   Consultants:  none  Procedures:  none  Antimicrobials:  Anti-infectives (From admission, onward)    None       Subjective: C/o frequent diarrhea  Objective: Vitals:   10/29/22 2100 10/29/22 2357 10/30/22 0428 10/30/22 0845  BP:  (!) 135/48 128/67 132/60  Pulse:  79 79 73  Resp:  17 16 18   Temp: 99.8 F (37.7 C) 100.1 F (37.8 C) 99 F (37.2 C)   TempSrc: Oral Oral Oral   SpO2:  96% 96% 96%  Weight:      Height:        Intake/Output Summary (Last 24 hours) at 10/30/2022 1011 Last data filed at 10/30/2022 0600 Gross per 24  hour  Intake 1307.87 ml  Output 100 ml  Net 1207.87 ml   Filed Weights   10/29/22 1524 10/29/22 1942  Weight: 73.5 kg 72.5 kg    Examination:  General exam: Appears calm and comfortable  Respiratory system: unlabored Cardiovascular system: RRR Gastrointestinal system: mild distension, no pain Central nervous system: Alert and oriented. No focal neurological deficits. Extremities: no LEE   Data Reviewed: I have personally reviewed following labs and imaging studies  CBC: Recent Labs  Lab 10/28/22 2000 10/29/22 1620 10/29/22 2123 10/30/22 0047  WBC 5.7 3.1* 2.8* 2.6*  NEUTROABS  --  2.5  --   --   HGB 12.0 10.7* 9.7* 9.2*  HCT 37.6 34.1* 29.6* 28.6*  MCV 88.9 89.3 87.8 85.9  PLT 144* 106* 104* 108*    Basic Metabolic Panel: Recent Labs  Lab 10/28/22 2000 10/29/22 1620 10/29/22 2123 10/30/22 0047  NA 137 135  --  134*  K 3.7 3.4*  --  3.5  CL 103 101  --  104  CO2 20* 19*  --  20*  GLUCOSE 84 77  --  123*  BUN 28* 28*  --  22  CREATININE 1.58* 1.45* 1.40* 1.32*  CALCIUM 9.1 8.7*  --  8.1*    GFR: Estimated Creatinine Clearance: 36.7 mL/min (Glenice Ciccone) (by C-G formula based on SCr of 1.32 mg/dL (H)).  Liver  Function Tests: Recent Labs  Lab 10/28/22 2000 10/29/22 1620 10/30/22 0047  AST 26 28 29   ALT 15 17 16   ALKPHOS 44 42 35*  BILITOT 1.0 1.0 0.8  PROT 7.6 6.5 5.2*  ALBUMIN 4.1 3.6 3.0*    CBG: No results for input(s): "GLUCAP" in the last 168 hours.   Recent Results (from the past 240 hour(s))  C Difficile Quick Screen w PCR reflex     Status: None   Collection Time: 10/29/22 11:45 PM   Specimen: STOOL  Result Value Ref Range Status   C Diff antigen NEGATIVE NEGATIVE Final   C Diff toxin NEGATIVE NEGATIVE Final   C Diff interpretation No C. difficile detected.  Final    Comment: Performed at [facility], [address], [place], [place] [address]         Radiology Studies: No results found.      Scheduled Meds:  buPROPion   300 mg Oral Daily   enoxaparin (LOVENOX) injection  40 mg Subcutaneous Q24H   metoprolol succinate  25 mg Oral Daily   mirabegron ER  50 mg Oral Daily   pantoprazole  40 mg Oral Daily   sertraline  200 mg Oral Daily   Continuous Infusions:  dextrose 5 % and 0.9 % NaCl with KCl 20 mEq/L 125 mL/hr at 10/30/22 0424     LOS: 0 days    Time spent: over 30 min    Lacretia Nicks, MD [facility]   To contact the attending provider between 7A-7P or the covering provider during after hours 7P-7A, please log into the web site [email or web address] and access using universal [place] password for that web site. If you do not have the password, please call the hospital operator.  10/30/2022, 10:11 AM

## 2022-10-30 NOTE — Care Management Obs Status (Signed)
MEDICARE OBSERVATION STATUS NOTIFICATION   Patient Details  Name: Maria Schneider MRN: [id number] Date of Birth: 1945-05-12   Medicare Observation Status Notification Given:  Yes    Tom-Johnson, Hershal Coria, RN 10/30/2022, 4:13 PM

## 2022-10-31 DIAGNOSIS — R197 Diarrhea, unspecified: Secondary | ICD-10-CM | POA: Diagnosis not present

## 2022-10-31 LAB — DIFFERENTIAL
Abs Immature Granulocytes: 0.02 10*3/uL (ref 0.00–0.07)
Basophils Absolute: 0 10*3/uL (ref 0.0–0.1)
Basophils Relative: 1 %
Eosinophils Absolute: 0.1 10*3/uL (ref 0.0–0.5)
Eosinophils Relative: 4 %
Immature Granulocytes: 1 %
Lymphocytes Relative: 19 %
Lymphs Abs: 0.3 10*3/uL — ABNORMAL LOW (ref 0.7–4.0)
Monocytes Absolute: 0.3 10*3/uL (ref 0.1–1.0)
Monocytes Relative: 20 %
Neutro Abs: 0.9 10*3/uL — ABNORMAL LOW (ref 1.7–7.7)
Neutrophils Relative %: 55 %
Smear Review: NORMAL

## 2022-10-31 LAB — CBC
HCT: 26.6 % — ABNORMAL LOW (ref 36.0–46.0)
HCT: 26.4 % — ABNORMAL LOW (ref 36.0–46.0)
Hemoglobin: 8.6 g/dL — ABNORMAL LOW (ref 12.0–15.0)
Hemoglobin: 8.6 g/dL — ABNORMAL LOW (ref 12.0–15.0)
MCH: 28.5 pg (ref 26.0–34.0)
MCH: 28.7 pg (ref 26.0–34.0)
MCHC: 32.3 g/dL (ref 30.0–36.0)
MCHC: 32.6 g/dL (ref 30.0–36.0)
MCV: 88.1 fL (ref 80.0–100.0)
MCV: 88 fL (ref 80.0–100.0)
Platelets: 100 10*3/uL — ABNORMAL LOW (ref 150–400)
Platelets: 95 10*3/uL — ABNORMAL LOW (ref 150–400)
RBC: 3.02 MIL/uL — ABNORMAL LOW (ref 3.87–5.11)
RBC: 3 MIL/uL — ABNORMAL LOW (ref 3.87–5.11)
RDW: 12.5 % (ref 11.5–15.5)
RDW: 12.6 % (ref 11.5–15.5)
WBC: 1.6 10*3/uL — ABNORMAL LOW (ref 4.0–10.5)
WBC: 1.6 10*3/uL — ABNORMAL LOW (ref 4.0–10.5)
nRBC: 0 % (ref 0.0–0.2)
nRBC: 0 % (ref 0.0–0.2)

## 2022-10-31 MED ORDER — MAGNESIUM SULFATE 2 GM/50ML IV SOLN
2.0000 g | Freq: Once | INTRAVENOUS | Status: AC
  Administered 2022-10-31: 2 g via INTRAVENOUS
  Filled 2022-10-31: qty 50

## 2022-10-31 MED ORDER — K PHOS MONO-SOD PHOS DI & MONO 155-852-130 MG PO TABS
500.0000 mg | ORAL_TABLET | Freq: Three times a day (TID) | ORAL | Status: DC
  Administered 2022-10-31 (×4): 500 mg via ORAL
  Filled 2022-10-31 (×6): qty 2

## 2022-10-31 NOTE — TOC CM/SW Note (Signed)
Transition of Care [facility]) - Inpatient Brief Assessment   Patient Details  Name: Maria Schneider MRN: [id number] Date of Birth: 1945/09/27  Transition of Care [facility]) CM/SW Contact:    Tom-Johnson, Hershal Coria, RN Phone Number: 10/31/2022, 3:37 PM   Clinical Narrative:  Patient presented to the ED with Nausea/Vomiting/Diarrhea x 3 days. GI Panel Positive for Salmonella abd C-Diff Negative. Currently on IV abx and IV Fluids.   From home with husband, has three children who lives out of town and supportive. Patient is a Retired Runner, broadcasting/film/video. Independent with care and drive self prior to admission.  Has a walker, shower seat and grab bars at home.  PCP is  Nelwyn Salisbury, MD and uses [facility] on [address]   No TOC needs or recommendations noted at this time. Patient not Medically ready for discharge.  CM will continue to follow as patient progresses with care towards discharge.          Transition of Care Asessment:   Patient has primary care physician: Yes Home environment has been reviewed: Yes Prior level of function:: Independent Prior/Current Home Services: No current home services Social Determinants of Health Reivew: SDOH reviewed no interventions necessary Readmission risk has been reviewed: Yes Transition of care needs: no transition of care needs at this time

## 2022-10-31 NOTE — Progress Notes (Signed)
PROGRESS NOTE    [name]  UJW:[id number] DOB: 12-12-1945 DOA: 10/29/2022 PCP: Nelwyn Salisbury, MD  Chief Complaint  Patient presents with   Diarrhea    Brief Narrative:    Maria Schneider is Maria Schneider 77 y.o. female with medical history significant of asthma, GERD, history of pancreatitis, urinary incontinence, essential hypertension and hyperlipidemia who was seen in the ER yesterday with Nausea vomiting and diarrhea.    Salmonella on GI path.   Assessment & Plan:   Principal Problem:   Nausea & vomiting Active Problems:   Dyslipidemia   Essential hypertension   GERD   Hypokalemia   AKI (acute kidney injury) (HCC)   Thrombocytopenia (HCC)   Leucopenia   Diarrhea   Nausea and vomiting  # Nausea  Vomiting  Diarrhea: Unclear cause at this point Primary symptom is diarrhea Negative c diff GI pathogen panel with salmonella -> ceftriaxone Supportive care with IVF - hold off on antidiarrheals while treating salmonella   #2 hypokalemia  hypophosphatemia  hypomagnesemia: replace and follow   #3 thrombocytopenia  Leukopenia: in setting of above? Will follow.  ANC 0.9 today, if this falls much further, would consider discussion with hematology.    #5 GERD: Continue PPIs   #6 essential hypertension: Continue arb, metoprolol    #7 hyperlipidemia: zetia, fibrate, crestor     DVT prophylaxis: lovenox Code Status: full Family Communication: none Disposition:   Status is: Observation The patient remains OBS appropriate and will d/c before 2 midnights.   Consultants:  none  Procedures:  none  Antimicrobials:  Anti-infectives (From admission, onward)    Start     Dose/Rate Route Frequency Ordered Stop   10/30/22 1300  cefTRIAXone (ROCEPHIN) 2 g in sodium chloride 0.9 % 100 mL IVPB        2 g 200 mL/hr over 30 Minutes Intravenous Every 24 hours 10/30/22 1156         Subjective: Continued diarrhea  Objective: Vitals:   10/30/22 1706 10/30/22 2227  10/31/22 0615 10/31/22 0816  BP: (!) 125/98 128/80 (!) 140/66 (!) 141/70  Pulse: 77 70 73 69  Resp: 18 18  16   Temp: 98.4 F (36.9 C) 98 F (36.7 C) 98.1 F (36.7 C) 98 F (36.7 C)  TempSrc:  Oral Oral Oral  SpO2: 100%  99% 98%  Weight:      Height:        Intake/Output Summary (Last 24 hours) at 10/31/2022 1526 Last data filed at 10/31/2022 1242 Gross per 24 hour  Intake 460 ml  Output --  Net 460 ml   Filed Weights   10/29/22 1524 10/29/22 1942  Weight: 73.5 kg 72.5 kg    Examination:  General: No acute distress. Lungs: unlabored Abdomen: Soft, nontender, mild distension Neurological: Alert and oriented 3. Moves all extremities 4 with equal strength. Cranial nerves II through XII grossly intact. Extremities: No clubbing or cyanosis. No edema.   Data Reviewed: I have personally reviewed following labs and imaging studies  CBC: Recent Labs  Lab 10/28/22 2000 10/29/22 1620 10/29/22 2123 10/30/22 0047 10/31/22 0147  WBC 5.7 3.1* 2.8* 2.6* 1.6*  1.6*  NEUTROABS  --  2.5  --   --  0.9*  HGB 12.0 10.7* 9.7* 9.2* 8.6*  8.6*  HCT 37.6 34.1* 29.6* 28.6* 26.4*  26.6*  MCV 88.9 89.3 87.8 85.9 88.0  88.1  PLT 144* 106* 104* 108* 95*  100*    Basic Metabolic Panel: Recent Labs  Lab  10/28/22 2000 10/29/22 1620 10/29/22 2123 10/30/22 0047 10/31/22 0147  NA 137 135  --  134* 139  K 3.7 3.4*  --  3.5 3.3*  CL 103 101  --  104 111  CO2 20* 19*  --  20* 21*  GLUCOSE 84 77  --  123* 142*  BUN 28* 28*  --  22 8  CREATININE 1.58* 1.45* 1.40* 1.32* 0.94  CALCIUM 9.1 8.7*  --  8.1* 7.8*  MG  --   --   --   --  1.6*  PHOS  --   --   --   --  1.4*    GFR: Estimated Creatinine Clearance: 51.6 mL/min (by C-G formula based on SCr of 0.94 mg/dL).  Liver Function Tests: Recent Labs  Lab 10/28/22 2000 10/29/22 1620 10/30/22 0047  AST 26 28 29   ALT 15 17 16   ALKPHOS 44 42 35*  BILITOT 1.0 1.0 0.8  PROT 7.6 6.5 5.2*  ALBUMIN 4.1 3.6 3.0*    CBG: No  results for input(s): "GLUCAP" in the last 168 hours.   Recent Results (from the past 240 hour(s))  C Difficile Quick Screen w PCR reflex     Status: None   Collection Time: 10/29/22 11:45 PM   Specimen: STOOL  Result Value Ref Range Status   C Diff antigen NEGATIVE NEGATIVE Final   C Diff toxin NEGATIVE NEGATIVE Final   C Diff interpretation No C. difficile detected.  Final    Comment: Performed at [facility], [address] [place], [place] [address]  Gastrointestinal Panel by PCR , Stool     Status: Abnormal   Collection Time: 10/29/22 11:46 PM   Specimen: STOOL  Result Value Ref Range Status   Campylobacter species NOT DETECTED NOT DETECTED Final   Plesimonas shigelloides NOT DETECTED NOT DETECTED Final   Salmonella species DETECTED (Avalina Benko) NOT DETECTED Final    Comment: RESULT CALLED TO, READ BACK BY AND VERIFIED WITH:  DENISE FREEMAN 10/30/2022 1150 CP    Yersinia enterocolitica NOT DETECTED NOT DETECTED Final   Vibrio species NOT DETECTED NOT DETECTED Final   Vibrio cholerae NOT DETECTED NOT DETECTED Final   Enteroaggregative E coli (EAEC) NOT DETECTED NOT DETECTED Final   Enteropathogenic E coli (EPEC) NOT DETECTED NOT DETECTED Final   Enterotoxigenic E coli (ETEC) NOT DETECTED NOT DETECTED Final   Shiga like toxin producing E coli (STEC) NOT DETECTED NOT DETECTED Final   Shigella/Enteroinvasive E coli (EIEC) NOT DETECTED NOT DETECTED Final   Cryptosporidium NOT DETECTED NOT DETECTED Final   Cyclospora cayetanensis NOT DETECTED NOT DETECTED Final   Entamoeba histolytica NOT DETECTED NOT DETECTED Final   Giardia lamblia NOT DETECTED NOT DETECTED Final   Adenovirus F40/41 NOT DETECTED NOT DETECTED Final   Astrovirus NOT DETECTED NOT DETECTED Final   Norovirus GI/GII NOT DETECTED NOT DETECTED Final   Rotavirus Catina Nuss NOT DETECTED NOT DETECTED Final   Sapovirus (I, II, IV, and V) NOT DETECTED NOT DETECTED Final    Comment: Performed at [facility], [address] [place], [place] [address]         Radiology Studies: No results found.      Scheduled Meds:  buPROPion  300 mg Oral Daily   enoxaparin (LOVENOX) injection  40 mg Subcutaneous Q24H   ezetimibe  10 mg Oral Daily   fenofibrate  160 mg Oral Daily   irbesartan  75 mg Oral Daily   metoprolol succinate  25  mg Oral Daily   mirabegron ER  50 mg Oral Daily   pantoprazole  40 mg Oral Daily   phosphorus  500 mg Oral TID AC & HS   sertraline  200 mg Oral Daily   Continuous Infusions:  cefTRIAXone (ROCEPHIN)  IV 2 g (10/31/22 1332)   dextrose 5 % and 0.9 % NaCl with KCl 20 mEq/L 125 mL/hr at 10/31/22 1411     LOS: 1 day    Time spent: over 30 min    Lacretia Nicks, MD [facility]   To contact the attending provider between 7A-7P or the covering provider during after hours 7P-7A, please log into the web site [email or web address] and access using universal [place] password for that web site. If you do not have the password, please call the hospital operator.  10/31/2022, 3:26 PM

## 2022-11-01 DIAGNOSIS — N179 Acute kidney failure, unspecified: Secondary | ICD-10-CM | POA: Diagnosis not present

## 2022-11-01 DIAGNOSIS — D696 Thrombocytopenia, unspecified: Secondary | ICD-10-CM

## 2022-11-01 DIAGNOSIS — R112 Nausea with vomiting, unspecified: Secondary | ICD-10-CM | POA: Diagnosis not present

## 2022-11-01 DIAGNOSIS — I10 Essential (primary) hypertension: Secondary | ICD-10-CM | POA: Diagnosis not present

## 2022-11-01 DIAGNOSIS — E876 Hypokalemia: Secondary | ICD-10-CM | POA: Diagnosis not present

## 2022-11-01 DIAGNOSIS — A020 Salmonella enteritis: Secondary | ICD-10-CM

## 2022-11-01 DIAGNOSIS — D703 Neutropenia due to infection: Secondary | ICD-10-CM

## 2022-11-01 MED ORDER — POTASSIUM CHLORIDE CRYS ER 20 MEQ PO TBCR
20.0000 meq | EXTENDED_RELEASE_TABLET | Freq: Once | ORAL | Status: AC
  Administered 2022-11-01: 20 meq via ORAL
  Filled 2022-11-01: qty 1

## 2022-11-01 MED ORDER — POTASSIUM CHLORIDE CRYS ER 20 MEQ PO TBCR: 40.0000 meq | EXTENDED_RELEASE_TABLET | Freq: Once | ORAL | Status: DC

## 2022-11-01 MED ORDER — POTASSIUM CHLORIDE 20 MEQ PO PACK
40.0000 meq | PACK | Freq: Two times a day (BID) | ORAL | Status: DC
  Administered 2022-11-01: 40 meq via ORAL
  Filled 2022-11-01 (×2): qty 2

## 2022-11-01 MED ORDER — CIPROFLOXACIN HCL 500 MG PO TABS
500.0000 mg | ORAL_TABLET | Freq: Two times a day (BID) | ORAL | Status: DC
  Administered 2022-11-01 – 2022-11-02 (×3): 500 mg via ORAL
  Filled 2022-11-01 (×3): qty 1

## 2022-11-01 NOTE — Progress Notes (Addendum)
Progress Note   Patient: Maria Schneider [id number]:[id number] DOB: 1945-04-20 DOA: 10/29/2022     2 DOS: the patient was seen and examined on 11/01/2022   Brief hospital course:  Maria Schneider is a 77 y.o. female with medical history significant of asthma, GERD, history of pancreatitis, urinary incontinence, essential hypertension and hyperlipidemia who was seen in the ER yesterday with Nausea vomiting and diarrhea.  Salmonella on GI path.  Patient is started on ceftriaxone therapy.  Her symptoms gradually improved and today she feels better.  Assessment and Plan: Nausea, vomiting, diarrhea- Her symptoms gradually improving. GI pathogen panel positive for Salmonella. Her IV access lost today, ceftriaxone changed to oral Cipro 500 twice daily. Patient is able to tolerate diet. No need for IV hydration at this time.  AKI Improved with IV hydration. Encourage oral fluids.  Avoid nephrotoxic drugs. Monitor daily BMP.  Hypokalemia Hypophosphatemia Hypomagnesemia Continue to replace and recheck accordingly.  Pancytopenia WBC noted to be 1.7 today. This is likely in the setting of GI infection. If there is any drop in absolute neutrophil count will discuss with hematology. Trend daily CBC.  GERD Continue PPI.  Essential hypertension Continue home medications including Avapro, metoprolol.  Hyperlipidemia Continue Zetia fenofibrate, Crestor.       Subjective: Patient is seen and examined today morning.  She is feeling better today, does have loose stools but better than yesterday.  No nausea abdominal discomfort.  Able to tolerate soft diet.  Physical Exam: Vitals:   10/31/22 2042 11/01/22 0534 11/01/22 0837 11/01/22 1554  BP: (!) 172/75 (!) 145/62 (!) 156/72 (!) 153/75  Pulse: 76 70 65 71  Resp: 16 18 16 18   Temp: 98.4 F (36.9 C) 98 F (36.7 C) 97.6 F (36.4 C) 98.2 F (36.8 C)  TempSrc: Oral Oral Oral Oral  SpO2: 100% 98% 98% 100%  Weight:      Height:        General - Elderly Caucasian female, no apparent distress HEENT - PERRLA, EOMI, atraumatic head, non tender sinuses. Lung - Clear, rales, rhonchi, wheezes. Heart - S1, S2 heard, no murmurs, rubs, trace pedal edema. Abdomen-soft nontender, nondistended, bowel sounds good Neuro - Alert, awake and oriented x 3, non focal exam. Skin - Warm and dry. Data Reviewed:     Latest Ref Rng & Units 11/01/2022    6:29 AM 10/31/2022    1:47 AM 10/30/2022   12:47 AM  CBC  WBC 4.0 - 10.5 K/uL 1.7  1.6    1.6  2.6   Hemoglobin 12.0 - 15.0 g/dL 8.6  8.6    8.6  9.2   Hematocrit 36.0 - 46.0 % 26.2  26.6    26.4  28.6   Platelets 150 - 400 K/uL 102  100    95  108        Latest Ref Rng & Units 11/01/2022    6:29 AM 10/31/2022    1:47 AM 10/30/2022   12:47 AM  BMP  Glucose 70 - 99 mg/dL 95  469  629   BUN 8 - 23 mg/dL 5  8  22    Creatinine 0.44 - 1.00 mg/dL 5.28  4.13  2.44   Sodium 135 - 145 mmol/L 141  139  134   Potassium 3.5 - 5.1 mmol/L 3.4  3.3  3.5   Chloride 98 - 111 mmol/L 112  111  104   CO2 22 - 32 mmol/L 22  21  20    Calcium 8.9 -  10.3 mg/dL 7.8  7.8  8.1     Family Communication: Patient's husband at bedside, updated  Disposition: Status is: Inpatient Remains inpatient appropriate because: Pancytopenia, electrolyte abnormalities, diarrhea.  Planned Discharge Destination: Home    Time spent: 43 minutes  Author: Marcelino Duster, MD 11/01/2022 4:55 PM  For on call review [email or web address].

## 2022-11-02 DIAGNOSIS — E785 Hyperlipidemia, unspecified: Secondary | ICD-10-CM

## 2022-11-02 DIAGNOSIS — R197 Diarrhea, unspecified: Secondary | ICD-10-CM | POA: Diagnosis not present

## 2022-11-02 DIAGNOSIS — N179 Acute kidney failure, unspecified: Secondary | ICD-10-CM | POA: Diagnosis not present

## 2022-11-02 DIAGNOSIS — I10 Essential (primary) hypertension: Secondary | ICD-10-CM | POA: Diagnosis not present

## 2022-11-02 DIAGNOSIS — A029 Salmonella infection, unspecified: Secondary | ICD-10-CM

## 2022-11-02 LAB — CBC
HCT: 27.7 % — ABNORMAL LOW (ref 36.0–46.0)
Hemoglobin: 8.9 g/dL — ABNORMAL LOW (ref 12.0–15.0)
MCH: 27.6 pg (ref 26.0–34.0)
MCHC: 32.1 g/dL (ref 30.0–36.0)
MCV: 85.8 fL (ref 80.0–100.0)
Platelets: 130 10*3/uL — ABNORMAL LOW (ref 150–400)
RBC: 3.23 MIL/uL — ABNORMAL LOW (ref 3.87–5.11)
RDW: 12.8 % (ref 11.5–15.5)
WBC: 2.7 10*3/uL — ABNORMAL LOW (ref 4.0–10.5)
nRBC: 0 % (ref 0.0–0.2)

## 2022-11-02 LAB — BASIC METABOLIC PANEL
Anion gap: 8 (ref 5–15)
BUN: <5 mg/dL — ABNORMAL LOW (ref 8–23)
CO2: 23 mmol/L (ref 22–32)
Calcium: 8.8 mg/dL — ABNORMAL LOW (ref 8.9–10.3)
Chloride: 109 mmol/L (ref 98–111)
Creatinine, Ser: 0.97 mg/dL (ref 0.44–1.00)
GFR, Estimated: >60 mL/min (ref >60)
Glucose, Bld: 93 mg/dL (ref 70–99)
Potassium: 4.6 mmol/L (ref 3.5–5.1)
Sodium: 140 mmol/L (ref 135–145)

## 2022-11-02 LAB — PHOSPHORUS: Phosphorus: 3.4 mg/dL (ref 2.5–4.6)

## 2022-11-02 LAB — MAGNESIUM: Magnesium: 1.9 mg/dL (ref 1.7–2.4)

## 2022-11-02 MED ORDER — CIPROFLOXACIN HCL 500 MG PO TABS: 500.0000 mg | ORAL_TABLET | Freq: Two times a day (BID) | ORAL | 0 refills | Status: AC

## 2022-11-02 NOTE — Plan of Care (Signed)

## 2022-11-02 NOTE — Progress Notes (Signed)
Patient being discharged home, self care. No PIV to be removed. AVS discussed, pt verbalized understanding. Patient left the unit in NAD with all belongings, husband to transport home.

## 2022-11-02 NOTE — TOC Transition Note (Signed)
Transition of Care [facility]) - CM/SW Discharge Note   Patient Details  Name: Maria Schneider MRN: [id number] Date of Birth: 1945/08/10  Transition of Care [facility]) CM/SW Contact:  Tom-Johnson, Hershal Coria, RN Phone Number: 11/02/2022, 10:51 AM   Clinical Narrative:     Patient is scheduled for discharge today.  Readmission Risk Assessment done. Hospital f/u and discharge instructions on AVS. No TOC needs or recommendations noted. Husband, Elijah Birk to transport at discharge.  No further TOC needs noted.       Final next level of care: Home/Self Care Barriers to Discharge: Barriers Resolved   Patient Goals and CMS Choice CMS Medicare.gov Compare Post Acute Care list provided to:: Patient Choice offered to / list presented to : NA  Discharge Placement                  Patient to be transferred to facility by: Husband Name of family member notified: Tom    Discharge Plan and Services Additional resources added to the After Visit Summary for                  DME Arranged: N/A DME Agency: NA       HH Arranged: NA HH Agency: NA        Social Determinants of Health (SDOH) Interventions SDOH Screenings   Food Insecurity: No Food Insecurity (08/09/2022)  Housing: Low Risk  (08/09/2022)  Utilities: Low Risk  (07/13/2022)   [facility]  Depression (PHQ2-9): Medium Risk (05/04/2022)  Financial Resource Strain: Low Risk  (03/30/2021)  Physical Activity: Insufficiently Active (03/30/2021)  Social Connections: Socially Integrated (03/30/2021)  Stress: No Stress Concern Present (03/30/2021)  Tobacco Use: Medium Risk (10/29/2022)     Readmission Risk Interventions    10/31/2022    3:36 PM  Readmission Risk Prevention Plan  Transportation Screening Complete  PCP or Specialist Appt within 5-7 Days Complete  Home Care Screening Complete  Medication Review (RN CM) Referral to Pharmacy

## 2022-11-02 NOTE — Discharge Summary (Signed)
Physician Discharge Summary   Patient: Maria Schneider MRN: [id number] DOB: [birth year]  Admit date:     10/29/2022  Discharge date: 11/02/22  Discharge Physician: Marcelino Duster   PCP: Nelwyn Salisbury, MD   Recommendations at discharge:    PCP follow up in 1 week. Recheck CBC, BMP during follow up.  Discharge Diagnoses: Principal Problem:   Nausea & vomiting Active Problems:   Dyslipidemia   Essential hypertension   GERD   Hypokalemia   AKI (acute kidney injury) (HCC)   Thrombocytopenia (HCC)   Leucopenia   Diarrhea   Nausea and vomiting   Salmonella infection  Resolved Problems:   * No resolved hospital problems. *  Hospital Course: Maria Schneider is a 77 y.o. female with medical history significant of asthma, GERD, history of pancreatitis, urinary incontinence, essential hypertension and hyperlipidemia who was seen in the ER for evaluation of nausea vomiting and diarrhea.  She reported 8 episode of nonbilious nonbloody diarrhea. Also tactile fever. Denied any recent antibiotic exposure. Denied any sick contacts. She is admitted for further management and evaluation of dehydration with AKI and hypokalemia. Patient is started on IV fluids, IV antiemetics, pain medications, replaced electrolytes.  Patient's kidney function gradually improved, electrolytes well repleted.  GI pathogen panel came back positive for Salmonella.  Patient is continued on ceftriaxone therapy.  Patient CBC noted to have pancytopenia likely due to her acute GI symptoms.  Her symptoms gradually improved, no nausea vomiting or abdominal discomfort, able to tolerate diet.  Her diarrhea better today and wishes to go home.  WBC, platelets count improved today and she is hemodynamically stable to be discharged home.  I advised her to follow-up with PCP to recheck CBC and BMP upon discharge as instructed.  Patient and her husband understand and agree with the discharge plan.  Assessment and Plan: Nausea,  vomiting, diarrhea- Her symptoms gradually improving. GI pathogen panel positive for Salmonella. Ceftriaxone changed to oral Cipro 500 twice daily x 5 days. Patient is able to tolerate diet.   AKI Improved with IV hydration. Encourage oral fluids.  Avoid nephrotoxic drugs. Repeat BMP at PCP follow up.   Hypokalemia Hypophosphatemia Hypomagnesemia Replaced accordingly. Recheck labs outpatient in 1 week.   Pancytopenia WBC dropped to 1.6 now improved to 2.7 Platelets down to 95 improved to 130. This is likely in the setting of GI infection. Advised PCP follow up for repeat CBC in 1 week.   GERD Continue PPI.   Essential hypertension Continue home medications including Avapro, metoprolol.   Hyperlipidemia Continue Zetia fenofibrate, Crestor.        Consultants: none Procedures performed: none  Disposition: Home Diet recommendation:  Discharge Diet Orders (From admission, onward)     Start     Ordered   11/02/22 0000  Diet - low sodium heart healthy        11/02/22 0940           Cardiac diet DISCHARGE MEDICATION: Allergies as of 11/02/2022       Reactions   Ace Inhibitors    REACTION: cough   Codeine Nausea And Vomiting   Fentanyl Itching   Statins    REACTION: MYALGIAS-muscle pain        Medication List     TAKE these medications    aspirin EC 81 MG tablet Take 1 tablet (81 mg total) by mouth daily. Swallow whole.   buPROPion 300 MG 24 hr tablet Commonly known as: WELLBUTRIN XL TAKE 1 TABLET BY  MOUTH EVERY DAY   ciprofloxacin 500 MG tablet Commonly known as: CIPRO Take 1 tablet (500 mg total) by mouth 2 (two) times daily for 5 days.   esomeprazole 20 MG capsule Commonly known as: NexIUM Take 1 capsule (20 mg total) by mouth 2 (two) times daily before a meal.   ezetimibe 10 MG tablet Commonly known as: ZETIA TAKE 1 TABLET BY MOUTH EVERY DAY   fenofibrate 160 MG tablet TAKE 1 TABLET EVERY DAY   metoprolol succinate 25 MG 24 hr  tablet Commonly known as: TOPROL-XL TAKE 1 TABLET (25 MG TOTAL) BY MOUTH DAILY.   Myrbetriq 50 MG Tb24 tablet Generic drug: mirabegron ER Take 50 mg by mouth daily.   ondansetron 4 MG tablet Commonly known as: ZOFRAN Take 1 tablet (4 mg total) by mouth every 6 (six) hours.   Repatha SureClick 140 MG/ML Soaj Generic drug: Evolocumab Inject 140 mg into the skin every 14 (fourteen) days.   sertraline 100 MG tablet Commonly known as: ZOLOFT TAKE 2 TABLETS BY MOUTH EVERY DAY   valsartan 80 MG tablet Commonly known as: DIOVAN TAKE 1 TABLET BY MOUTH EVERY DAY   Vitamin D3 25 MCG (1000 UT) Caps Take 2,000 Units by mouth daily.        Discharge Exam: Filed Weights   10/29/22 1524 10/29/22 1942  Weight: 73.5 kg 72.5 kg   General - Elderly Caucasian female, no apparent distress HEENT - PERRLA, EOMI, atraumatic head, non tender sinuses. Lung - Clear, rales, rhonchi, wheezes. Heart - S1, S2 heard, no murmurs, rubs, trace pedal edema. Abdomen-soft nontender, nondistended, bowel sounds good Neuro - Alert, awake and oriented x 3, non focal exam. Skin - Warm and dry.  Condition at discharge: stable  The results of significant diagnostics from this hospitalization (including imaging, microbiology, ancillary and laboratory) are listed below for reference.   Imaging Studies: No results found.  Microbiology: Results for orders placed or performed during the hospital encounter of 10/29/22  C Difficile Quick Screen w PCR reflex     Status: None   Collection Time: 10/29/22 11:45 PM   Specimen: STOOL  Result Value Ref Range Status   C Diff antigen NEGATIVE NEGATIVE Final   C Diff toxin NEGATIVE NEGATIVE Final   C Diff interpretation No C. difficile detected.  Final    Comment: Performed at [facility], [address], [place], [place] [address]  Gastrointestinal Panel by PCR , Stool     Status: Abnormal   Collection Time: 10/29/22 11:46 PM   Specimen: STOOL  Result Value  Ref Range Status   Campylobacter species NOT DETECTED NOT DETECTED Final   Plesimonas shigelloides NOT DETECTED NOT DETECTED Final   Salmonella species DETECTED (A) NOT DETECTED Final    Comment: RESULT CALLED TO, READ BACK BY AND VERIFIED WITH:  DENISE FREEMAN 10/30/2022 1150 CP    Yersinia enterocolitica NOT DETECTED NOT DETECTED Final   Vibrio species NOT DETECTED NOT DETECTED Final   Vibrio cholerae NOT DETECTED NOT DETECTED Final   Enteroaggregative E coli (EAEC) NOT DETECTED NOT DETECTED Final   Enteropathogenic E coli (EPEC) NOT DETECTED NOT DETECTED Final   Enterotoxigenic E coli (ETEC) NOT DETECTED NOT DETECTED Final   Shiga like toxin producing E coli (STEC) NOT DETECTED NOT DETECTED Final   Shigella/Enteroinvasive E coli (EIEC) NOT DETECTED NOT DETECTED Final   Cryptosporidium NOT DETECTED NOT DETECTED Final   Cyclospora cayetanensis NOT DETECTED NOT DETECTED Final   Entamoeba histolytica NOT DETECTED  NOT DETECTED Final   Giardia lamblia NOT DETECTED NOT DETECTED Final   Adenovirus F40/41 NOT DETECTED NOT DETECTED Final   Astrovirus NOT DETECTED NOT DETECTED Final   Norovirus GI/GII NOT DETECTED NOT DETECTED Final   Rotavirus A NOT DETECTED NOT DETECTED Final   Sapovirus (I, II, IV, and V) NOT DETECTED NOT DETECTED Final    Comment: Performed at [facility], [address] [place], [place] [address]    Labs: CBC: Recent Labs  Lab 10/29/22 1620 10/29/22 2123 10/30/22 0047 10/31/22 0147 11/01/22 0629 11/02/22 0134  WBC 3.1* 2.8* 2.6* 1.6*  1.6* 1.7* 2.7*  NEUTROABS 2.5  --   --  0.9*  --   --   HGB 10.7* 9.7* 9.2* 8.6*  8.6* 8.6* 8.9*  HCT 34.1* 29.6* 28.6* 26.4*  26.6* 26.2* 27.7*  MCV 89.3 87.8 85.9 88.0  88.1 84.8 85.8  PLT 106* 104* 108* 95*  100* 102* 130*   Basic Metabolic Panel: Recent Labs  Lab 10/29/22 1620 10/29/22 2123 10/30/22 0047 10/31/22 0147 11/01/22 0629 11/02/22 0134  NA 135  --  134* 139 141 140  K 3.4*  --  3.5 3.3*  3.4* 4.6  CL 101  --  104 111 112* 109  CO2 19*  --  20* 21* 22 23  GLUCOSE 77  --  123* 142* 95 93  BUN 28*  --  22 8 <5* <5*  CREATININE 1.45* 1.40* 1.32* 0.94 0.83 0.97  CALCIUM 8.7*  --  8.1* 7.8* 7.8* 8.8*  MG  --   --   --  1.6* 1.8 1.9  PHOS  --   --   --  1.4* 3.0 3.4   Liver Function Tests: Recent Labs  Lab 10/28/22 2000 10/29/22 1620 10/30/22 0047  AST 26 28 29   ALT 15 17 16   ALKPHOS 44 42 35*  BILITOT 1.0 1.0 0.8  PROT 7.6 6.5 5.2*  ALBUMIN 4.1 3.6 3.0*   CBG: No results for input(s): "GLUCAP" in the last 168 hours.  Discharge time spent: greater than 30 minutes.  Signed: Marcelino Duster, MD [facility] 11/02/2022

## 2022-11-03 ENCOUNTER — Ambulatory Visit: Payer: MEDICARE | Primary: Family Medicine

## 2022-11-03 ENCOUNTER — Telehealth: Payer: Self-pay

## 2022-11-03 DIAGNOSIS — M4807 Spinal stenosis, lumbosacral region: Secondary | ICD-10-CM

## 2022-11-03 NOTE — Transitions of Care (Post Inpatient/ED Visit) (Signed)
   11/03/2022  Name: Maria Schneider MRN: [id number] DOB: Jan 27, 1946  Today's TOC FU Call Status: Today's TOC FU Call Status:: Unsuccessful Call (1st Attempt) Unsuccessful Call (1st Attempt) Date: 11/03/22  Attempted to reach the patient regarding the most recent Inpatient/ED visit.  Follow Up Plan: Additional outreach attempts will be made to reach the patient to complete the Transitions of Care (Post Inpatient/ED visit) call.      Antionette Fairy, RN,BSN,CCM [facility] Care Management Care Management Community Coordinator Direct Phone: [phone] Toll Free: [phone] Fax: [phone]

## 2022-11-04 ENCOUNTER — Other Ambulatory Visit: Payer: Self-pay | Admitting: Family Medicine

## 2022-11-04 MED ORDER — NIRMATRELVIR/RITONAVIR (PAXLOVID)TABLET: 3.0000 | ORAL_TABLET | Freq: Two times a day (BID) | ORAL | 0 refills | Status: AC

## 2022-11-04 NOTE — Progress Notes (Signed)
The patient called in while I was on call.  She is Covid-19 positive, and she requests Paxlovid.     That is reasonable.  No contraindication and GFR >60.  Sent in for her.

## 2022-11-06 ENCOUNTER — Telehealth: Payer: Self-pay

## 2022-11-06 NOTE — Transitions of Care (Post Inpatient/ED Visit) (Signed)
   11/06/2022  Name: MAECYN WENDLAND MRN: [id number] DOB: 26-Nov-1945  Today's TOC FU Call Status: Today's TOC FU Call Status:: Unsuccessful Call (2nd Attempt) Unsuccessful Call (2nd Attempt) Date: 11/06/22  Attempted to reach the patient regarding the most recent Inpatient/ED visit.  Follow Up Plan: Additional outreach attempts will be made to reach the patient to complete the Transitions of Care (Post Inpatient/ED visit) call.     Antionette Fairy, RN,BSN,CCM [facility] Care Management Care Management Community Coordinator Direct Phone: [phone] Toll Free: [phone] Fax: [phone]

## 2022-11-07 ENCOUNTER — Telehealth: Payer: Self-pay

## 2022-11-07 ENCOUNTER — Inpatient Hospital Stay: Payer: Medicare PPO | Admitting: Family Medicine

## 2022-11-07 NOTE — Transitions of Care (Post Inpatient/ED Visit) (Signed)
   11/07/2022  Name: Maria Schneider MRN: [id number] DOB: 04/17/1945  Today's TOC FU Call Status: Today's TOC FU Call Status:: Unsuccessful Call (3rd Attempt) Unsuccessful Call (3rd Attempt) Date: 11/07/22  Attempted to reach the patient regarding the most recent Inpatient/ED visit.  Follow Up Plan: No further outreach attempts will be made at this time. We have been unable to contact the patient.     Antionette Fairy, RN,BSN,CCM [facility] Care Management Care Management Community Coordinator Direct Phone: [phone] Toll Free: [phone] Fax: [phone]

## 2022-11-13 ENCOUNTER — Ambulatory Visit: Payer: Medicare PPO | Admitting: Family Medicine

## 2022-11-13 ENCOUNTER — Encounter: Payer: Self-pay | Admitting: Family Medicine

## 2022-11-13 VITALS — BP 126/68 | HR 74 | Temp 97.9°F | Wt 159.0 lb

## 2022-11-13 DIAGNOSIS — R5383 Other fatigue: Secondary | ICD-10-CM

## 2022-11-13 DIAGNOSIS — Z8619 Personal history of other infectious and parasitic diseases: Secondary | ICD-10-CM | POA: Diagnosis not present

## 2022-11-13 DIAGNOSIS — R197 Diarrhea, unspecified: Secondary | ICD-10-CM

## 2022-11-13 DIAGNOSIS — Z09 Encounter for follow-up examination after completed treatment for conditions other than malignant neoplasm: Secondary | ICD-10-CM | POA: Diagnosis not present

## 2022-11-13 DIAGNOSIS — N179 Acute kidney failure, unspecified: Secondary | ICD-10-CM

## 2022-11-13 DIAGNOSIS — U071 COVID-19: Secondary | ICD-10-CM

## 2022-11-13 DIAGNOSIS — A020 Salmonella enteritis: Secondary | ICD-10-CM | POA: Diagnosis not present

## 2022-11-13 DIAGNOSIS — A029 Salmonella infection, unspecified: Secondary | ICD-10-CM

## 2022-11-13 DIAGNOSIS — E876 Hypokalemia: Secondary | ICD-10-CM

## 2022-11-13 DIAGNOSIS — Z8616 Personal history of COVID-19: Secondary | ICD-10-CM | POA: Diagnosis not present

## 2022-11-13 DIAGNOSIS — D61818 Other pancytopenia: Secondary | ICD-10-CM

## 2022-11-13 DIAGNOSIS — N1832 Chronic kidney disease, stage 3b: Secondary | ICD-10-CM

## 2022-11-13 NOTE — Progress Notes (Signed)
   Subjective:    Patient ID: Maria Schneider, female    DOB: October 03, 1945, 77 y.o.   MRN: [id number]  HPI Here for a transitional care visit after a hospital stay from 10-29-22 to 11-02-22 for a Salmonella enteritis. She presented with fever, nausea with vomiting, and diarrhea. No abdominal pain. She tested positive for Salmonella and she was treated with IV fluids and Ceftriaxone. She was sent home on a few days of Cipro. During the stay she had AKI with her creatinine rising to 1.45, but this was down to 0.97 by DC. Her potassium dropped to 3.3 but went back up to 4.6. Her Hgb dropped to 8.6 but rebounded to 8.9. Her WBC dropped to 1.6 but rose to 2.7 by DC. Her platelet count dropped to 95 but rose to 130 by DC. Since going home the NVD has stopped and her appetite is back to normal. Her BM's are normal. However the day after getting home she developed a stuffy head, PND, and a dry cough. She tested positive for Covid, and she was called in a course of Paxlovid. These symptoms have also resolved, so the only residual thing she feels now is fatigue.    Review of Systems  Constitutional:  Positive for fatigue. Negative for fever.  Respiratory: Negative.    Cardiovascular: Negative.   Gastrointestinal: Negative.   Genitourinary: Negative.        Objective:   Physical Exam Constitutional:      Appearance: Normal appearance.  HENT:     Right Ear: Tympanic membrane, ear canal and external ear normal.     Left Ear: Tympanic membrane, ear canal and external ear normal.     Nose: Nose normal.     Mouth/Throat:     Pharynx: Oropharynx is clear.  Eyes:     Conjunctiva/sclera: Conjunctivae normal.  Cardiovascular:     Rate and Rhythm: Normal rate and regular rhythm.     Pulses: Normal pulses.     Heart sounds: Normal heart sounds.  Pulmonary:     Effort: Pulmonary effort is normal.     Breath sounds: Normal breath sounds.  Abdominal:     General: Abdomen is flat. Bowel sounds are normal. There  is no distension.     Palpations: Abdomen is soft. There is no mass.     Tenderness: There is no abdominal tenderness. There is no guarding or rebound.     Hernia: No hernia is present.  Lymphadenopathy:     Cervical: No cervical adenopathy.  Neurological:     Mental Status: She is alert.           Assessment & Plan:  She had a Salmonella enteritis which has resolved. She also had a Covid infection which has resolved. We will check a BMET and CBC to follow the lab abnormalities above.  Gershon Crane, MD

## 2022-11-14 ENCOUNTER — Other Ambulatory Visit: Payer: Self-pay | Admitting: Family Medicine

## 2022-11-14 LAB — BASIC METABOLIC PANEL
BUN: 23 mg/dL (ref 6–23)
CO2: 25 meq/L (ref 19–32)
Calcium: 9.4 mg/dL (ref 8.4–10.5)
Chloride: 104 meq/L (ref 96–112)
Creatinine, Ser: 1.37 mg/dL — ABNORMAL HIGH (ref 0.40–1.20)
GFR: 37.89 mL/min — ABNORMAL LOW (ref >60.00)
Glucose, Bld: 70 mg/dL (ref 70–99)
Potassium: 4.4 meq/L (ref 3.5–5.1)
Sodium: 139 meq/L (ref 135–145)

## 2022-11-14 LAB — CBC WITH DIFFERENTIAL/PLATELET
Basophils Absolute: 0.1 10*3/uL (ref 0.0–0.1)
Basophils Relative: 1 % (ref 0.0–3.0)
Eosinophils Absolute: 0.1 10*3/uL (ref 0.0–0.7)
Eosinophils Relative: 1.6 % (ref 0.0–5.0)
HCT: 37.3 % (ref 36.0–46.0)
Hemoglobin: 11.9 g/dL — ABNORMAL LOW (ref 12.0–15.0)
Lymphocytes Relative: 21.3 % (ref 12.0–46.0)
Lymphs Abs: 1.1 10*3/uL (ref 0.7–4.0)
MCHC: 31.9 g/dL (ref 30.0–36.0)
MCV: 85.4 fl (ref 78.0–100.0)
Monocytes Absolute: 0.4 10*3/uL (ref 0.1–1.0)
Monocytes Relative: 7.2 % (ref 3.0–12.0)
Neutro Abs: 3.6 10*3/uL (ref 1.4–7.7)
Neutrophils Relative %: 68.9 % (ref 43.0–77.0)
Platelets: 255 10*3/uL (ref 150.0–400.0)
RBC: 4.37 Mil/uL (ref 3.87–5.11)
RDW: 13.4 % (ref 11.5–15.5)
WBC: 5.3 10*3/uL (ref 4.0–10.5)

## 2022-11-17 ENCOUNTER — Other Ambulatory Visit: Payer: Self-pay | Admitting: Family Medicine

## 2022-11-21 NOTE — Addendum Note (Signed)
Addended by: Gershon Crane A on: 11/21/2022 12:54 PM   Modules accepted: Orders

## 2022-11-27 DIAGNOSIS — Z1231 Encounter for screening mammogram for malignant neoplasm of breast: Secondary | ICD-10-CM | POA: Diagnosis not present

## 2022-11-27 LAB — HM MAMMOGRAPHY

## 2022-12-07 ENCOUNTER — Encounter: Payer: MEDICARE | Attending: Orthopaedic Surgery | Primary: Family Medicine

## 2022-12-20 DIAGNOSIS — N3281 Overactive bladder: Secondary | ICD-10-CM | POA: Diagnosis not present

## 2022-12-20 DIAGNOSIS — N3946 Mixed incontinence: Secondary | ICD-10-CM | POA: Diagnosis not present

## 2022-12-22 ENCOUNTER — Encounter: Payer: Self-pay | Admitting: Family Medicine

## 2022-12-22 ENCOUNTER — Ambulatory Visit: Payer: Medicare PPO | Admitting: Family Medicine

## 2022-12-22 VITALS — BP 126/74 | HR 83 | Temp 98.4°F | Wt 159.0 lb

## 2022-12-22 DIAGNOSIS — S63502A Unspecified sprain of left wrist, initial encounter: Secondary | ICD-10-CM | POA: Diagnosis not present

## 2022-12-22 DIAGNOSIS — M79642 Pain in left hand: Secondary | ICD-10-CM | POA: Diagnosis not present

## 2022-12-22 DIAGNOSIS — Z23 Encounter for immunization: Secondary | ICD-10-CM

## 2022-12-22 NOTE — Progress Notes (Signed)
   Subjective:    Patient ID: Maria Schneider, female    DOB: Apr 28, 1945, 77 y.o.   MRN: [id number]  HPI Here to check an injury to her left wrist that occurred at home 4 days ago. While going down some steps her foot slipped, causing her to fall. She was holding onto the rail with her left hand, and the wrist got wrenched. She immediately had swelling and pain in the wrist. She has been icing it daily, and the swelling has reduced somewhat, but she still has pain. She has been taking ES Tylenol.    Review of Systems  Constitutional: Negative.   Respiratory: Negative.    Cardiovascular: Negative.   Musculoskeletal:  Positive for arthralgias.       Objective:   Physical Exam Constitutional:      General: She is not in acute distress.    Appearance: Normal appearance.  Cardiovascular:     Rate and Rhythm: Normal rate and regular rhythm.     Pulses: Normal pulses.     Heart sounds: Normal heart sounds.  Pulmonary:     Effort: Pulmonary effort is normal.     Breath sounds: Normal breath sounds.  Musculoskeletal:     Comments: The left wrist is swollen on the radial side. No ecchymosis. She is tender over the 1st [facility] and and the anatomical snuffbox.   Neurological:     Mental Status: She is alert.           Assessment & Plan:  She has had a significant sprain to the left wrist, likely with ligamentous injury. We will snd her to the [facility] walk in clinic to be evaluated. Gershon Crane, MD

## 2022-12-26 DIAGNOSIS — I129 Hypertensive chronic kidney disease with stage 1 through stage 4 chronic kidney disease, or unspecified chronic kidney disease: Secondary | ICD-10-CM | POA: Diagnosis not present

## 2022-12-26 DIAGNOSIS — N1832 Chronic kidney disease, stage 3b: Secondary | ICD-10-CM | POA: Diagnosis not present

## 2022-12-28 DIAGNOSIS — M13842 Other specified arthritis, left hand: Secondary | ICD-10-CM | POA: Diagnosis not present

## 2022-12-28 LAB — LAB REPORT - SCANNED
Albumin, Urine POC: 55.3
Creatinine, POC: 327.9 mg/dL
EGFR: 40
Microalb Creat Ratio: 17

## 2023-01-10 ENCOUNTER — Other Ambulatory Visit: Payer: Self-pay | Admitting: Family Medicine

## 2023-01-11 NOTE — Telephone Encounter (Signed)
Patient would like to wait before starting Zepbound.

## 2023-01-12 NOTE — Telephone Encounter (Signed)
Noted  

## 2023-01-14 ENCOUNTER — Other Ambulatory Visit: Payer: Self-pay | Admitting: Family Medicine

## 2023-01-25 DIAGNOSIS — M13842 Other specified arthritis, left hand: Secondary | ICD-10-CM | POA: Diagnosis not present

## 2023-02-04 ENCOUNTER — Other Ambulatory Visit: Payer: Self-pay | Admitting: Family Medicine

## 2023-02-05 ENCOUNTER — Encounter: Admit: 2023-02-05 | Admitting: Family Medicine

## 2023-02-05 DIAGNOSIS — J3800 Paralysis of vocal cords and larynx, unspecified: Secondary | ICD-10-CM

## 2023-02-05 MED ORDER — ALPRAZOLAM 0.25 MG PO TABS
0.25 MG | ORAL_TABLET | Freq: Two times a day (BID) | ORAL | 0 refills | Status: DC | PRN
Start: 2023-02-05 — End: 2023-06-16

## 2023-02-05 NOTE — Telephone Encounter (Signed)
Requested Prescriptions     Pending Prescriptions Disp Refills    ALPRAZolam (XANAX) 0.25 MG tablet 360 tablet 0     Sig: Take 2 tablets by mouth 2 times daily as needed (vocal cord paralysis) for up to 90 days. Max Daily Amount: 1 mg

## 2023-03-19 ENCOUNTER — Encounter: Admit: 2023-03-19 | Admitting: Family Medicine

## 2023-03-19 DIAGNOSIS — Z1231 Encounter for screening mammogram for malignant neoplasm of breast: Secondary | ICD-10-CM

## 2023-03-26 DIAGNOSIS — L820 Inflamed seborrheic keratosis: Secondary | ICD-10-CM | POA: Diagnosis not present

## 2023-03-26 DIAGNOSIS — L578 Other skin changes due to chronic exposure to nonionizing radiation: Secondary | ICD-10-CM | POA: Diagnosis not present

## 2023-03-26 DIAGNOSIS — L814 Other melanin hyperpigmentation: Secondary | ICD-10-CM | POA: Diagnosis not present

## 2023-03-26 DIAGNOSIS — L570 Actinic keratosis: Secondary | ICD-10-CM | POA: Diagnosis not present

## 2023-03-26 DIAGNOSIS — D2271 Melanocytic nevi of right lower limb, including hip: Secondary | ICD-10-CM | POA: Diagnosis not present

## 2023-03-26 DIAGNOSIS — L821 Other seborrheic keratosis: Secondary | ICD-10-CM | POA: Diagnosis not present

## 2023-03-26 DIAGNOSIS — D225 Melanocytic nevi of trunk: Secondary | ICD-10-CM | POA: Diagnosis not present

## 2023-03-31 ENCOUNTER — Other Ambulatory Visit: Payer: Self-pay | Admitting: Family Medicine

## 2023-04-01 ENCOUNTER — Encounter

## 2023-04-14 ENCOUNTER — Other Ambulatory Visit: Payer: Self-pay | Admitting: Family Medicine

## 2023-04-16 ENCOUNTER — Inpatient Hospital Stay: Admit: 2023-04-16 | Discharge: 2023-04-16 | Payer: MEDICARE | Primary: Family Medicine

## 2023-04-16 DIAGNOSIS — E782 Mixed hyperlipidemia: Secondary | ICD-10-CM

## 2023-04-16 DIAGNOSIS — H04123 Dry eye syndrome of bilateral lacrimal glands: Secondary | ICD-10-CM | POA: Diagnosis not present

## 2023-04-16 DIAGNOSIS — H524 Presbyopia: Secondary | ICD-10-CM | POA: Diagnosis not present

## 2023-04-16 DIAGNOSIS — H25813 Combined forms of age-related cataract, bilateral: Secondary | ICD-10-CM | POA: Diagnosis not present

## 2023-04-16 LAB — CBC WITH AUTO DIFFERENTIAL
Basophils %: 1 % (ref 0.0–2.0)
Basophils Absolute: 0.1 10*3/uL (ref 0.0–0.2)
Eosinophils %: 1.9 % (ref 0.0–7.0)
Eosinophils Absolute: 0.1 10*3/uL (ref 0.0–0.5)
Hematocrit: 44.1 % (ref 34.0–47.0)
Hemoglobin: 14.8 g/dL (ref 11.5–15.7)
Immature Grans (Abs): 0.02 10*3/uL (ref 0.00–0.06)
Immature Granulocytes %: 0.4 % (ref 0.0–0.6)
Lymphocytes Absolute: 1.9 10*3/uL (ref 1.0–3.2)
Lymphocytes: 35.9 % (ref 15.0–45.0)
MCH: 31.1 pg (ref 27.0–34.5)
MCHC: 33.6 g/dL (ref 30.0–36.0)
MCV: 92.6 fL (ref 81.0–99.0)
MPV: 8.9 fL (ref 7.0–12.2)
Monocytes %: 10.9 % (ref 4.0–12.0)
Monocytes Absolute: 0.6 10*3/uL (ref 0.3–1.0)
NRBC Absolute: 0 10*3/uL (ref 0.000–0.012)
NRBC Automated: 0 % (ref 0.0–0.2)
Neutrophils %: 49.9 % (ref 42.0–74.0)
Neutrophils Absolute: 2.6 10*3/uL (ref 1.6–7.3)
Platelets: 324 10*3/uL (ref 140–440)
RBC: 4.76 x10e6/mcL (ref 3.60–5.20)
RDW: 12 % (ref 10.0–17.0)
WBC: 5.2 10*3/uL (ref 3.8–10.6)

## 2023-04-16 LAB — TSH REFLEX TO FT4: TSH: 2.01 u[IU]/mL (ref 0.358–3.740)

## 2023-04-16 LAB — LIPID PANEL
Chol/HDL Ratio: 4.7 — ABNORMAL HIGH (ref 0.0–4.4)
Cholesterol, Total: 204 mg/dL — ABNORMAL HIGH (ref 100–200)
HDL: 43 mg/dL — ABNORMAL LOW (ref >=50)
LDL Cholesterol: 133 mg/dL — ABNORMAL HIGH (ref 0.0–100.0)
LDL/HDL Ratio: 3.1
Triglycerides: 140 mg/dL (ref 0–149)
VLDL: 28 mg/dL (ref 5.0–40.0)

## 2023-04-16 LAB — HEMOGLOBIN A1C
Estimated Avg Glucose: 101
Estimated Avg Glucose: 97
Hemoglobin A1C: 5 % (ref 4.0–6.0)

## 2023-04-16 LAB — COMPREHENSIVE METABOLIC PANEL
ALT: 14 U/L (ref 0–42)
AST: 19 U/L (ref 0–46)
Albumin/Globulin Ratio: 1.91 (ref 1.00–2.70)
Albumin: 4.2 g/dL (ref 3.5–5.2)
Alk Phosphatase: 52 U/L (ref 35–117)
Anion Gap: 9 mmol/L (ref 2–17)
BUN: 14 mg/dL (ref 8–23)
CO2: 27 mmol/L (ref 22–29)
Calcium: 8.9 mg/dL (ref 8.5–10.7)
Chloride: 104 mmol/L (ref 98–107)
Creatinine: 0.8 mg/dL (ref 0.5–1.0)
Est, Glom Filt Rate: 77 mL/min/1.73mÂ² (ref >=60)
Globulin: 2.2 g/dL (ref 1.9–4.4)
Glucose: 87 mg/dL (ref 70–99)
Osmolaliy Calculated: 279 mosm/kg (ref 270–287)
Potassium: 4.3 mmol/L (ref 3.5–5.3)
Sodium: 140 mmol/L (ref 135–145)
Total Bilirubin: 0.63 mg/dL (ref 0.00–1.20)
Total Protein: 6.4 g/dL (ref 5.7–8.3)

## 2023-04-19 ENCOUNTER — Ambulatory Visit: Admit: 2023-04-19 | Discharge: 2023-04-19 | Payer: MEDICARE | Attending: Family Medicine | Primary: Family Medicine

## 2023-04-19 ENCOUNTER — Telehealth

## 2023-04-19 VITALS — BP 116/74 | HR 82 | Ht 63.0 in | Wt 155.7 lb

## 2023-04-19 DIAGNOSIS — Z0000 Encounter for general adult medical examination without abnormal findings: Secondary | ICD-10-CM

## 2023-04-19 MED ORDER — BEMPEDOIC ACID 180 MG PO TABS
180 | ORAL_TABLET | Freq: Every day | ORAL | 3 refills | Status: DC
Start: 2023-04-19 — End: 2023-10-18

## 2023-04-19 MED ORDER — CLOBETASOL PROPIONATE 0.05 % EX OINT
0.05 | CUTANEOUS | 3 refills | Status: DC
Start: 2023-04-19 — End: 2023-04-19

## 2023-04-19 NOTE — Progress Notes (Signed)
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     Subjective       Patient's complete Health Risk Assessment and screening values have been reviewed and are found in Flowsheets. The following problems were reviewed today and where indicated follow up appointments were made and/or referrals ordered.    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), Adhesive capsulitis of shoulder, Asthma, Bursitis, Chondromalacia of patella, right, Chronic back pain, Complex tear of medial meniscus of right knee, subsequent encounter, Complex tear of medial meniscus, current injury, right knee, subsequent encounter, Drug effect, Essential hypertension, Fibromyalgia, Fractures, GERD (gastroesophageal reflux disease), Hepatic steatosis, High cholesterol, Lumbar radiculopathy, Lung cancer (HCC), Malignant neoplasm of skin, Obesity, Osteoarthritis, Osteopenia, Osteoporosis, Patellofemoral syndrome of right knee, Restrictive lung disease, Rotator cuff tear, Sciatica, Spasmodic dysphonia, and Spondylolisthesis.    She presents today for SAWV.     She is down 20 lbs-taking semaglutide compounded through  [place]     Shoulder : Ok, no major changes.     She tells me her brother has been newly diagnosed with cancer of liver, lung, bone-primary unclear.     We will review labs today.     The 10-year ASCVD risk score (Arnett DK, et al., 2019) is: 13.4%    Values used to calculate the score:      Age: 22 years      Sex: Female      Is Non-Hispanic African American: No      Diabetic: No      Tobacco smoker: No      Systolic Blood Pressure: 116 mmHg      Is BP treated: No      HDL Cholesterol: 43 mg/dL      Total Cholesterol: 204 mg/dL      Positive Risk Factor Screenings with Interventions:              Inactivity:  On average, how many days per week do you engage in moderate to strenuous exercise (like a brisk walk)?: 2 days (!) Abnormal  On average, how many minutes do you engage in exercise at this level?: 30 min  Interventions:  See AVS for additional education material                         Objective   Vitals:    04/19/23 0805   BP: 116/74   Site: Left Upper Arm   Position: Sitting   Pulse: 82  SpO2: 100%   Weight: 70.6 kg (155 lb 11.2 oz)   Height: 1.6 m (5\' 3" )      Body mass index is 27.58 kg/m.                   Allergies   Allergen Reactions    Levaquin [Levofloxacin] Palpitations and Other (See Comments)     Event:        Dilaudid [Hydromorphone]     Flurbiprofen      Other reaction(s): Unknown    Nsaids      Other reaction(s): stomach upset    Ezetimibe Other (See Comments)     Prior to Visit Medications    Medication Sig Taking? Authorizing Provider   clobetasol (TEMOVATE) 0.05 % ointment Apply topically 2 times daily. Yes Rise Paganini, APRN - NP   Bempedoic Acid 180 MG TABS Take 180 mg by mouth daily Yes Rise Paganini, APRN - NP   ALPRAZolam Prudy Feeler) 0.25 MG tablet Take 2 tablets by mouth 2 times daily as needed (vocal cord paralysis) for up to 90 days. Max Daily Amount: 1 mg Yes Rise Paganini, APRN - NP   omeprazole (PRILOSEC) 40 MG delayed release capsule Take 1 capsule by  mouth daily Yes Rise Paganini, APRN - NP   vitamin B-12 (CYANOCOBALAMIN) 100 MCG tablet Vitamin B-12 Yes [provider]   MAGNESIUM PO Take by mouth Yes [provider]   BIOTIN PO Take by mouth Yes [provider]   estradiol (ESTRACE VAGINAL) 0.1 MG/GM vaginal cream Place 1 g vaginally daily Nightly x 2 weeks, every other night x 2 weeks, then as needed Yes Higdon, Cherrie Gauze, MD   albuterol sulfate HFA (PROVENTIL;VENTOLIN;PROAIR) 108 (90 Base) MCG/ACT inhaler  Yes [provider]   vitamin D3 (CHOLECALCIFEROL) 125 MCG (5000 UT) TABS tablet   5,000 units = tabs, Oral, Daily, # 100 tabs, 0 Refill(s) Yes [provider]   calcium carbonate 1500 (600 Ca) MG TABS tablet Take 2 tablets by mouth daily as needed Yes [provider]   acetaminophen (TYLENOL) 325 MG tablet 1 tablet as needed Orally every 6 hrs Yes [facility], [facility], MD   MILK THISTLE PO Take by mouth  [provider]   Plant Sterols and Stanols (CHOLESTOFF PO) Take by mouth  [provider]   Tirzepatide-Weight Management 2.5 MG/0.5ML SOAJ Inject 2.5 mg into the skin once a week  Rise Paganini, APRN - NP       CareTeam (Including outside providers/suppliers regularly involved in providing care):   Patient Care Team:  Rise Paganini, APRN - NP as PCP - General (Nurse Practitioner, Family)  Jorene Guest, Karna Dupes, MD as PCP - Empaneled Provider  Aggie Cosier, Pearline Cables, MD as Cardiologist (Cardiology)  Claudia Desanctis, MD as Consulting Provider (Pulmonology)  Luan Pulling, MD as Surgeon (Gastroenterology)     Recommendations for Preventive Services Due: see orders and patient instructions/AVS.  Recommended screening schedule for the next 5-10 years is provided to the patient in written form: see Patient Instructions/AVS.     Reviewed and updated this visit:  Tobacco  Allergies  Meds  Med Hx  Surg Hx  Soc Hx  Fam Hx

## 2023-04-19 NOTE — Patient Instructions (Signed)
Learning About Being Active as an Older Adult  Why is being active important as you get older?     Being active is one of the best things you can do for your health. And it's never too late to start. Being active--or getting active, if you aren't already--has definite benefits. It can:  Give you more energy,  Keep your mind sharp.  Improve balance to reduce your risk of falls.  Help you manage chronic illness with fewer medicines.  No matter how old you are, how fit you are, or what health problems you have, there is a form of activity that will work for you. And the more physical activity you can do, the better your overall health will be.  What kinds of activity can help you stay healthy?  Being more active will make your daily activities easier. Physical activity includes planned exercise and things you do in daily life. There are four types of activity:  Aerobic.  Doing aerobic activity makes your heart and lungs strong.  Includes walking, dancing, and gardening.  Aim for at least 2 hours spread throughout the week.  It improves your energy and can help you sleep better.  Muscle-strengthening.  This type of activity can help maintain muscle and strengthen bones.  Includes climbing stairs, using resistance bands, and lifting or carrying heavy loads.  Aim for at least twice a week.  It can help protect the knees and other joints.  Stretching.  Stretching gives you better range of motion in joints and muscles.  Includes upper arm stretches, calf stretches, and gentle yoga.  Aim for at least twice a week, preferably after your muscles are warmed up from other activities.  It can help you function better in daily life.  Balancing.  This helps you stay coordinated and have good posture.  Includes heel-to-toe walking, tai chi, and certain types of yoga.  Aim for at least 3 days a week.  It can reduce your risk of falling.  Even if you have a hard time meeting the recommendations, it's better to be more active  than less active. All activity done in each category counts toward your weekly total. You'd be surprised how daily things like carrying groceries, keeping up with grandchildren, and taking the stairs can add up.  What keeps you from being active?  If you've had a hard time being more active, you're not alone. Maybe you remember being able to do more. Or maybe you've never thought of yourself as being active. It's frustrating when you can't do the things you want. Being more active can help. What's holding you back?  Getting started.  Have a goal, but break it into easy tasks. Small steps build into big accomplishments.  Staying motivated.  If you feel like skipping your activity, remember your goal. Maybe you want to move better and stay independent. Every activity gets you one step closer.  Not feeling your best.  Start with 5 minutes of an activity you enjoy. Prove to yourself you can do it. As you get comfortable, increase your time.  You may not be where you want to be. But you're in the process of getting there. Everyone starts somewhere.  How can you find safe ways to stay active?  Talk with your doctor about any physical challenges you're facing. Make a plan with your doctor if you have a health problem or aren't sure how to get started with activity.  If you're already active, ask your doctor if  there is anything you should change to stay safe as your body and health change.  If you tend to feel dizzy after you take medicine, avoid activity at that time. Try being active before you take your medicine. This will reduce your risk of falls.  If you plan to be active at home, make sure to clear your space before you get started. Remove things like TV cords, coffee tables, and throw rugs. It's safest to have plenty of space to move freely.  The key to getting more active is to take it slow and steady. Try to improve only a little bit at a time. Pick just one area to improve on at first. And if an activity hurts,  stop and talk to your doctor.  Where can you learn more?  Go to [employer].ca and enter P600 to learn more about "Learning About Being Active as an Older Adult."  Current as of: October 18, 2022  Content Version: 14.3   [address], [place].   Care instructions adapted under license by [facility]. If you have questions about a medical condition or this instruction, always ask your healthcare professional. Larene Beach, [facility], disclaims any warranty or liability for your use of this information.         A Healthy Heart: Care Instructions  Overview     Coronary artery disease, also called heart disease, occurs when a substance called plaque builds up in the vessels that supply oxygen-rich blood to your heart muscle. This can narrow the blood vessels and reduce blood flow. A heart attack happens when blood flow is completely blocked. A high-fat diet, smoking, and other factors increase the risk of heart disease.  Your doctor has found that you have a chance of having heart disease. A heart-healthy lifestyle can help keep your heart healthy and prevent heart disease. This lifestyle includes eating healthy, being active, staying at a weight that's healthy for you, and not smoking or using tobacco. It also includes taking medicines as directed, managing other health conditions, and trying to get a healthy amount of sleep.  Follow-up care is a key part of your treatment and safety. Be sure to make and go to all appointments, and call your doctor if you are having problems. It's also a good idea to know your test results and keep a list of the medicines you take.  How can you care for yourself at home?  Diet    Use less salt when you cook and eat. This helps lower your blood pressure. Taste food before salting. Add only a little salt when you think you need it. With time, your taste buds will adjust to less salt.     Eat fewer snack items, fast foods, canned soups, and other high-salt,  high-fat, processed foods.     Read food labels and try to avoid saturated and trans fats. They increase your risk of heart disease by raising cholesterol levels.     Limit the amount of solid fat--butter, margarine, and shortening--you eat. Use olive, peanut, or canola oil when you cook. Bake, broil, and steam foods instead of frying them.     Eat a variety of fruit and vegetables every day. Dark green, deep orange, red, or yellow fruits and vegetables are especially good for you. Examples include spinach, carrots, peaches, and berries.     Foods high in fiber can reduce your cholesterol and provide important vitamins and minerals. High-fiber foods include whole-grain cereals and breads, oatmeal, beans, brown  rice, citrus fruits, and apples.     Eat lean proteins. Heart-healthy proteins include seafood, lean meats and poultry, eggs, beans, peas, nuts, seeds, and soy products.     Limit drinks and foods with added sugar. These include candy, desserts, and soda pop.   Heart-healthy lifestyle    If your doctor recommends it, get more exercise. For many people, walking is a good choice. Or you may want to swim, bike, or do other activities. Bit by bit, increase the time you're active every day. Try for at least 30 minutes on most days of the week.     Try to quit or cut back on using tobacco and other nicotine products. This includes smoking and vaping. If you need help quitting, talk to your doctor about stop-smoking programs and medicines. These can increase your chances of quitting for good. Quitting is one of the most important things you can do to protect your heart. It is never too late to quit. Try to avoid secondhand smoke too.     Stay at a weight that's healthy for you. Talk to your doctor if you need help losing weight.     Try to get 7 to 9 hours of sleep each night.     Limit alcohol to 2 drinks a day for men and 1 drink a day for women. Too much alcohol can cause health problems.     Manage other health  problems such as diabetes, high blood pressure, and high cholesterol. If you think you may have a problem with alcohol or drug use, talk to your doctor.   Medicines    Take your medicines exactly as prescribed. Call your doctor if you think you are having a problem with your medicine.     If your doctor recommends aspirin, take the amount directed each day. Make sure you take aspirin and not another kind of pain reliever, such as acetaminophen (Tylenol).   When should you call for help?   Call 911 if you have symptoms of a heart attack. These may include:    Chest pain or pressure, or a strange feeling in the chest.     Sweating.     Shortness of breath.     Pain, pressure, or a strange feeling in the back, neck, jaw, or upper belly or in one or both shoulders or arms.     Lightheadedness or sudden weakness.     A fast or irregular heartbeat.   After you call 911, the operator may tell you to chew 1 adult-strength or 2 to 4 low-dose aspirin. Wait for an ambulance. Do not try to drive yourself.  Watch closely for changes in your health, and be sure to contact your doctor if you have any problems.  Where can you learn more?  Go to [employer] and enter F075 to learn more about "A Healthy Heart: Care Instructions."  Current as of: October 18, 2022  Content Version: 14.3   [address], [place].   Care instructions adapted under license by [facility] If you have questions about a medical condition or this instruction, always ask your healthcare professional. Larene Beach, [facility], disclaims any warranty or liability for your use of this information.    Personalized Preventive Plan for Lorraine Freeman - 04/19/2023  Medicare offers a range of preventive health benefits. Some of the tests and screenings are paid in full while other may be subject to a deductible, co-insurance, and/or copay.  Some of these benefits  include a comprehensive review of your medical history including lifestyle,  illnesses that may run in your family, and various assessments and screenings as appropriate.  After reviewing your medical record and screening and assessments performed today your provider may have ordered immunizations, labs, imaging, and/or referrals for you.  A list of these orders (if applicable) as well as your Preventive Care list are included within your After Visit Summary for your review.

## 2023-04-19 NOTE — Telephone Encounter (Signed)
Darl Pikes from [facility] called [phone] and they need to know how many days the prescription for Clobetasol is for and what area patient  will be using it on.

## 2023-04-20 ENCOUNTER — Encounter: Payer: Self-pay | Admitting: Internal Medicine

## 2023-04-20 ENCOUNTER — Ambulatory Visit: Payer: Medicare PPO | Attending: Internal Medicine | Admitting: Internal Medicine

## 2023-04-20 VITALS — BP 128/74 | HR 65 | Ht 64.0 in | Wt 159.0 lb

## 2023-04-20 DIAGNOSIS — U071 COVID-19: Secondary | ICD-10-CM | POA: Diagnosis not present

## 2023-04-20 DIAGNOSIS — R61 Generalized hyperhidrosis: Secondary | ICD-10-CM

## 2023-04-20 DIAGNOSIS — I2510 Atherosclerotic heart disease of native coronary artery without angina pectoris: Secondary | ICD-10-CM | POA: Diagnosis not present

## 2023-04-20 DIAGNOSIS — R079 Chest pain, unspecified: Secondary | ICD-10-CM

## 2023-04-20 DIAGNOSIS — R490 Dysphonia: Secondary | ICD-10-CM | POA: Diagnosis not present

## 2023-04-20 DIAGNOSIS — R002 Palpitations: Secondary | ICD-10-CM | POA: Diagnosis not present

## 2023-04-20 DIAGNOSIS — I10 Essential (primary) hypertension: Secondary | ICD-10-CM | POA: Diagnosis not present

## 2023-04-20 DIAGNOSIS — E785 Hyperlipidemia, unspecified: Secondary | ICD-10-CM

## 2023-04-20 DIAGNOSIS — R0609 Other forms of dyspnea: Secondary | ICD-10-CM

## 2023-04-20 MED ORDER — CLOBETASOL PROPIONATE 0.05 % EX OINT
0.05 | Freq: Two times a day (BID) | CUTANEOUS | 3 refills | Status: AC
Start: 2023-04-20 — End: 2023-04-26

## 2023-04-20 NOTE — Progress Notes (Signed)
 Cardiology Office Note:  .   Date:  04/20/2023  ID:  Maria Schneider, DOB [birth year], MRN [id number] PCP: Nelwyn Salisbury, MD  [facility] Providers Cardiologist:  Parke Poisson, MD    History of Present Illness: .   Maria Schneider is a 78 y.o. female.  Discussed the use of AI scribe software for clinical note transcription with the patient, who gave verbal consent to proceed.  History of Present Illness   The patient presents for a follow-up visit after a challenging year with multiple health issues.  The patient is currently on Repatha for cholesterol management, which has been effective. She is also taking Valsartan 80 mg daily and Metoprolol for blood pressure management. Tylenol is used for pain management and is effective. She is not on any statins but continues with Zetia and fibrate for cholesterol management.  She experienced a loss of voice following a trip to [place], attributed to a viral infection. Despite the passage of time, the voice has not fully recovered and gets tired more easily.   In August, she experienced severe gastrointestinal symptoms, leading to a diagnosis of salmonella infection. She was hospitalized for four days due to the severity of the illness. The source of the infection was suspected to be a restaurant visit, but the exact cause was not determined.  Following the hospitalization, she contracted COVID-19, likely from the hospital stay. She has a history of COVID-19 infections, with significant symptoms including a persistent cough and voice loss lasting for months after each infection. She has been experiencing frequent illnesses, raising concerns about her immune system possibly being affected by grief.  She has been diagnosed with stage 3 kidney disease by a specialist, although no immediate action was necessary and a follow-up was scheduled for a year later. She expresses concern about this new diagnosis, especially given her history of normal  kidney function.  She has received the influenza vaccine, which may have contributed to avoiding influenza despite exposure. She is planning to join a gym for regular exercise.  No chest pain, shortness of breath, or headaches. No new issues with breathing or chest discomfort.        ROS: negative except per HPI above.  Studies Reviewed: .        Results   LABS Kidney function: Stage 3 chronic kidney disease  DIAGNOSTIC Echocardiogram: Normal ejection fraction, normal heart valves     Risk Assessment/Calculations:        Physical Exam:   VS:  BP 128/74 (BP Location: Left Arm, Patient Position: Sitting, Cuff Size: Normal)   Pulse 65   Ht 5\' 4"  (1.626 m)   Wt 159 lb (72.1 kg)   SpO2 96%   BMI 27.29 kg/m    Wt Readings from Last 3 Encounters:  04/20/23 159 lb (72.1 kg)  12/22/22 159 lb (72.1 kg)  11/13/22 159 lb (72.1 kg)     Physical Exam   VITALS: BP- 124/70, SaO2- 95 CHEST: Lungs clear to auscultation CARDIOVASCULAR: Heart sounds normal     GEN: Well nourished, well developed in no acute distress NECK: No JVD; No carotid bruits CARDIAC: RRR, no murmurs, rubs, gallops RESPIRATORY:  Clear to auscultation without rales, wheezing or rhonchi  ABDOMEN: Soft, non-tender, non-distended EXTREMITIES:  No edema; No deformity   ASSESSMENT AND PLAN: .    Assessment & Plan Coronary artery disease involving native coronary artery of native heart without angina pectoris  Essential hypertension  Hyperlipidemia, unspecified hyperlipidemia type  Diaphoresis  DOE (dyspnea on exertion)  Palpitations  Chest pain, unspecified type  COVID-19 virus infection  Hoarseness   Assessment and Plan   Hoarseness Vocal Cord Dysfunction Persistent voice changes for over a year following a viral illness. Specialist evaluation confirmedno worrisome findings. -No change in management.  COVID-19 Contracted COVID-19 after hospitalization for Salmonella. -No current  intervention needed as the patient has recovered.  Chronic Kidney Disease Stage 3 CKD diagnosed by a specialist. -Check kidney function when cholesterol is checked.  Hyperlipidemia On Repatha and Zetia. Last cholesterol check in June 2023. -Order fasting lipid panel.  Hypertension Well controlled on Valsartan 80mg  daily and Metoprolol. -Continue current management.  General Health Maintenance -Plan to join a gym for regular exercise. -Check cholesterol and kidney function when fasting. -Follow-up in a year unless labs are concerning.     Dyspnea on exertion Palpitations Diaphoresis CAD  - moderate CAD by coronary CTA last year. Continue repatha. ASA 81 mg daily.  - sx have otherwise resolved. Observe.

## 2023-04-20 NOTE — Patient Instructions (Signed)
Medication Instructions:  Continue current medicaitons *If you need a refill on your cardiac medications before your next appointment, please call your pharmacy*   Lab Work: BMET, Lipid Panel  If you have labs (blood work) drawn today and your tests are completely normal, you will receive your results only by: MyChart Message (if you have MyChart) OR A paper copy in the mail If you have any lab test that is abnormal or we need to change your treatment, we will call you to review the results.   Testing/Procedures: none   Follow-Up: At [facility], you and your health needs are our priority.  As part of our continuing mission to provide you with exceptional heart care, we have created designated Provider Care Teams.  These Care Teams include your primary Cardiologist (physician) and Advanced Practice Providers (APPs -  Physician Assistants and Nurse Practitioners) who all work together to provide you with the care you need, when you need it.  We recommend signing up for the patient portal called "MyChart".  Sign up information is provided on this After Visit Summary.  MyChart is used to connect with patients for Virtual Visits (Telemedicine).  Patients are able to view lab/test results, encounter notes, upcoming appointments, etc.  Non-urgent messages can be sent to your provider as well.   To learn more about what you can do with MyChart, go to [email or web address].    Your next appointment:   1 year(s)  Provider:   Parke Poisson, MD     Other Instructions none

## 2023-05-01 ENCOUNTER — Inpatient Hospital Stay: Admit: 2023-05-01 | Payer: MEDICARE | Primary: Family Medicine

## 2023-05-01 DIAGNOSIS — Z1231 Encounter for screening mammogram for malignant neoplasm of breast: Secondary | ICD-10-CM

## 2023-05-02 ENCOUNTER — Other Ambulatory Visit: Payer: Self-pay | Admitting: Family Medicine

## 2023-05-03 ENCOUNTER — Other Ambulatory Visit: Payer: Self-pay | Admitting: Family Medicine

## 2023-05-07 ENCOUNTER — Ambulatory Visit: Payer: Medicare PPO | Admitting: Family Medicine

## 2023-05-07 ENCOUNTER — Encounter: Payer: Self-pay | Admitting: Family Medicine

## 2023-05-07 VITALS — BP 126/70 | HR 59 | Temp 97.9°F | Wt 159.8 lb

## 2023-05-07 DIAGNOSIS — M5412 Radiculopathy, cervical region: Secondary | ICD-10-CM

## 2023-05-07 DIAGNOSIS — M5431 Sciatica, right side: Secondary | ICD-10-CM

## 2023-05-07 MED ORDER — CYCLOBENZAPRINE HCL 10 MG PO TABS: 10.0000 mg | ORAL_TABLET | Freq: Three times a day (TID) | ORAL | 0 refills | Status: DC | PRN

## 2023-05-07 MED ORDER — METHYLPREDNISOLONE 4 MG PO TBPK: ORAL_TABLET | ORAL | 0 refills | Status: DC

## 2023-05-07 NOTE — Progress Notes (Signed)
   Subjective:    Patient ID: Maria Schneider, female    DOB: 1946-03-03, 78 y.o.   MRN: [id number]  HPI Here for neck and low back pains. About 9 months ago she developed sharp pains in the right lower back that come and go. Now they are radiating into the right buttock and right thigh. No hx of trauma. She had a lumbar spine MRI in 2013 that showed some degenerative discs. In addition she developed sharp pains in the right neck that radiate to the right shoulder and upper arm about one month ago. She gets a little relief with heat and Tylenol.   Review of Systems  Constitutional: Negative.   Respiratory: Negative.    Cardiovascular: Negative.   Musculoskeletal:  Positive for back pain and neck pain.       Objective:   Physical Exam Constitutional:      General: She is not in acute distress.    Appearance: Normal appearance.  Cardiovascular:     Rate and Rhythm: Normal rate and regular rhythm.     Pulses: Normal pulses.     Heart sounds: Normal heart sounds.  Pulmonary:     Effort: Pulmonary effort is normal.     Breath sounds: Normal breath sounds.  Musculoskeletal:     Comments: She is tender in the right lower posterior neck area as well as the superior right trapezius. The neck has full ROM. The right shoulder is normal. She is also tender in there right lower back and right sciatic notch. ROM of the lower spine is full. SLR are negative.   Neurological:     Mental Status: She is alert.           Assessment & Plan:  Cervical and lumbar radicular pains. She will take a Medrol dose pack and Flexeril. Follow up as needed. Gershon Crane, MD

## 2023-05-28 ENCOUNTER — Other Ambulatory Visit: Payer: Self-pay | Admitting: Family Medicine

## 2023-06-03 ENCOUNTER — Other Ambulatory Visit: Payer: Self-pay | Admitting: Family Medicine

## 2023-06-04 DIAGNOSIS — I2510 Atherosclerotic heart disease of native coronary artery without angina pectoris: Secondary | ICD-10-CM | POA: Diagnosis not present

## 2023-06-04 DIAGNOSIS — I10 Essential (primary) hypertension: Secondary | ICD-10-CM | POA: Diagnosis not present

## 2023-06-04 DIAGNOSIS — E785 Hyperlipidemia, unspecified: Secondary | ICD-10-CM | POA: Diagnosis not present

## 2023-06-05 LAB — LIPID PANEL
Chol/HDL Ratio: 2.3 ratio (ref 0.0–4.4)
Cholesterol, Total: 157 mg/dL (ref 100–199)
HDL: 68 mg/dL (ref >39)
LDL Chol Calc (NIH): 65 mg/dL (ref 0–99)
Triglycerides: 141 mg/dL (ref 0–149)
VLDL Cholesterol Cal: 24 mg/dL (ref 5–40)

## 2023-06-05 LAB — BASIC METABOLIC PANEL
BUN/Creatinine Ratio: 16 (ref 12–28)
BUN: 21 mg/dL (ref 8–27)
CO2: 23 mmol/L (ref 20–29)
Calcium: 9.4 mg/dL (ref 8.7–10.3)
Chloride: 105 mmol/L (ref 96–106)
Creatinine, Ser: 1.28 mg/dL — ABNORMAL HIGH (ref 0.57–1.00)
Glucose: 96 mg/dL (ref 70–99)
Potassium: 4.7 mmol/L (ref 3.5–5.2)
Sodium: 141 mmol/L (ref 134–144)
eGFR: 44 mL/min/{1.73_m2} — ABNORMAL LOW (ref >59)

## 2023-06-16 ENCOUNTER — Encounter

## 2023-06-18 MED ORDER — ALPRAZOLAM 0.25 MG PO TABS
0.25 | ORAL_TABLET | Freq: Two times a day (BID) | ORAL | 0 refills | Status: DC | PRN
Start: 2023-06-18 — End: 2023-10-18

## 2023-06-18 NOTE — Telephone Encounter (Signed)
Requested Prescriptions     Pending Prescriptions Disp Refills    ALPRAZolam (XANAX) 0.25 MG tablet 360 tablet 0     Sig: Take 2 tablets by mouth 2 times daily as needed (vocal cord paralysis) for up to 90 days. Max Daily Amount: 1 mg

## 2023-06-21 DIAGNOSIS — N1832 Chronic kidney disease, stage 3b: Secondary | ICD-10-CM | POA: Diagnosis not present

## 2023-06-25 DIAGNOSIS — N1832 Chronic kidney disease, stage 3b: Secondary | ICD-10-CM | POA: Diagnosis not present

## 2023-06-26 ENCOUNTER — Telehealth: Payer: Self-pay | Admitting: Internal Medicine

## 2023-06-26 DIAGNOSIS — I129 Hypertensive chronic kidney disease with stage 1 through stage 4 chronic kidney disease, or unspecified chronic kidney disease: Secondary | ICD-10-CM | POA: Diagnosis not present

## 2023-06-26 DIAGNOSIS — N1832 Chronic kidney disease, stage 3b: Secondary | ICD-10-CM | POA: Diagnosis not present

## 2023-06-26 NOTE — Telephone Encounter (Signed)
 Left message with call back number.

## 2023-06-26 NOTE — Telephone Encounter (Signed)
 Pt returning call to a nurse

## 2023-06-26 NOTE — Telephone Encounter (Signed)
 Left detailed message on machine. Pt informed to call back with further questions if needed.

## 2023-06-26 NOTE — Telephone Encounter (Signed)
 Patient identification verified by 2 forms.   Called and spoke to patient  Patient states:  -"I had an episode Saturday" -Pt felt shaky, sat down, ate a protein bar and drank water.  -After resting for awhile tried to leave "I was not sure if I was going to make it to the car. I had to sit don again"  -"My heart was pounding, it was working really hard." -"The car was a ways away, the more I walked the harder my heart worked." -Weakness the rest of the day.   Patient denies:  -Additional episodes -Heart pounding since the episode   Pt was advised to notify Dr. Jacques Navy of the episode.    Reviewed ED warning signs/precautions  Patient agrees with plan, no questions at this time

## 2023-06-26 NOTE — Telephone Encounter (Signed)
 Patient c/o Palpitations:  STAT if patient reporting lightheadedness, shortness of breath, or chest pain  How long have you had palpitations/irregular HR/ Afib? Are you having the symptoms now? Only on sat, no not since then  Are you currently experiencing lightheadedness, SOB or CP? no  Do you have a history of afib (atrial fibrillation) or irregular heart rhythm? no  Have you checked your BP or HR? (document readings if available): no  Are you experiencing any other symptoms? dizzy

## 2023-06-30 ENCOUNTER — Other Ambulatory Visit: Payer: Self-pay | Admitting: Internal Medicine

## 2023-06-30 DIAGNOSIS — I2510 Atherosclerotic heart disease of native coronary artery without angina pectoris: Secondary | ICD-10-CM

## 2023-07-02 ENCOUNTER — Other Ambulatory Visit: Payer: Self-pay | Admitting: Family Medicine

## 2023-07-03 ENCOUNTER — Other Ambulatory Visit: Payer: Self-pay | Admitting: Family Medicine

## 2023-08-02 ENCOUNTER — Encounter

## 2023-08-02 NOTE — Telephone Encounter (Signed)
 Requested Prescriptions     Pending Prescriptions Disp Refills    omeprazole (PRILOSEC) 40 MG delayed release capsule 90 capsule 3     Sig: Take 1 capsule by mouth daily

## 2023-08-03 MED ORDER — OMEPRAZOLE 40 MG PO CPDR
40 | ORAL_CAPSULE | Freq: Every day | ORAL | 3 refills | 90.00000 days | Status: AC
Start: 2023-08-03 — End: ?

## 2023-08-06 ENCOUNTER — Ambulatory Visit: Payer: Self-pay | Admitting: Internal Medicine

## 2023-08-07 ENCOUNTER — Telehealth: Payer: Self-pay | Admitting: Internal Medicine

## 2023-08-07 DIAGNOSIS — I2510 Atherosclerotic heart disease of native coronary artery without angina pectoris: Secondary | ICD-10-CM

## 2023-08-07 MED ORDER — REPATHA SURECLICK 140 MG/ML ~~LOC~~ SOAJ: 140.0000 mg | SUBCUTANEOUS | 5 refills | Status: DC

## 2023-08-07 NOTE — Telephone Encounter (Signed)
*  STAT* If patient is at the pharmacy, call can be transferred to refill team.   1. Which medications need to be refilled? (please list name of each medication and dose if known) Evolocumab  (REPATHA  SURECLICK) 140 MG/ML SOAJ    2. Would you like to learn more about the convenience, safety, & potential cost savings by using the [facility]?    3. Are you open to using the [facility] (Type [facility]. ).   4. Which pharmacy/location (including street and city if local pharmacy) is medication to be sent to? [id number] - [place], [place] - [address]    5. Do they need a 30 day or 90 day supply? 90 day

## 2023-08-14 ENCOUNTER — Other Ambulatory Visit: Payer: Self-pay | Admitting: Family Medicine

## 2023-08-17 NOTE — Progress Notes (Signed)
   08/17/2023  Patient ID: [place], female   DOB: [birth year], 78 y.o.   MRN: [id number]  Pharmacy Quality Measure Review  This patient is appearing on a report for being at risk of failing the adherence measure for hypertension (ACEi/ARB) medications this calendar year.   Medication: Valsartan   Last fill date: 07/02/23 for 90 day supply  Insurance report was not up to date. No action needed at this time.   Carnell Christian, PharmD Clinical Pharmacist [phone]

## 2023-09-04 NOTE — Progress Notes (Signed)
 NEUROLOGY FOLLOW UP OFFICE NOTE  Maria Schneider [id number]  Assessment/Plan:   Memory deficits, unclear etiology - Repeated MoCA testing has demonstrated not just stability but improvement.  However, given that she endorses memory difficulty on a daily basis, further monitoring is warranted. Balance disorder - unclear if residual from prior hip surgeries.  History of lumbar spondylosis, so lumbar stenosis is possible but does not exhibit any significant bilateral leg pain Tremor - possibly essential tremor    Continue routine walking and exercise to help with balance Monitor:  memory, tremor, balance.  Plan to follow up in 6 months or sooner if needed for re-evaluation. If she thinks memory is getting worse, consider neuropsychological evaluation  Total time spent in chart and face to face with patient:  33 minutes.     Subjective:  Maria Schneider is a 78 year old right-handed female with CAD, HTN, HLD who follows up for memory deficits and balance disorder.  History supplemented by her accompanying husband an referring provider's note.  UPDATE: Last year, underwent labs for other potential causes of neuropathy:  negative ANA and ENA panel, negative IFE, ACE 34.  She feels balance is fairly unchanged.  She is more cautious and aware to prevent falls.  If she turns around quickly, she becomes off balance.  She has low back pain and know degenerative lumbar spine disease, so her PCP is considering MRI of lumbar spine.  Memory problems have also remained stable.  Memory difficulty is a daily problem but able to perform all daily activities, does not get disoriented in familiar places and manages her medications.      HISTORY: Balance disorder: She began experiencing balance problems in 2023.  She describes it mostly as a problem with depth perception.  She has cataracts.  She may walk into things.  She also feels like she is dragging her left leg.  At first she thought it was  related to having had bilateral hip replacement but it has gotten worse.  If she turns too quickly or bends over, she may have vertigo for a few seconds.  Sometimes she notices brief vertical diplopia.  She finds that she chokes more frequently.   Denies neck and back pain.  Denies numbness in the feet.    Memory deficits: She also reports increased problems with short term memory over the past couple of months.  She will quickly forget conversations after 10 minutes.  She may need to think a moment to remember things that day, such as what she ate for breakfast.  If she is talking and is interrupted, she easily loses her train of thought and has difficulty redirecting to her conversation.  Sometimes she has difficulty pronouncing words.   Hand tremor: Sometimes if she is holding something in her left hand, it will shake.  Usually that occurs after she has been leaning on her elbow for a while.   Does not occur in her right hand.  Tremors do not run in the family.  Testing: TSH was 3.06.  She was found to have a B12 level of 91 in June 2023, but symptoms persisted despite injections with repeat level in October 2023 >1500.  Around that time, her sister had recently passed away unexpectedly, which may be contributing to increased emotional stress that could be affecting symptoms such as memory.  MRI of brain without contrast on 04/27/2022 revealed generalized volume loss since imaging from 2017 and very mild chronic small vessel ischemic changes within  the cerebral white matter but otherwise unremarkable.      PAST MEDICAL HISTORY: Past Medical History:  Diagnosis Date   Allergy    Arthritis    Asthma    Cancer (HCC)    hx skin cancer   Depression    Detrusor instability    Diverticulosis    not symptomatic   GERD (gastroesophageal reflux disease)    History of kidney stones    History of pancreatitis    April 2015   History of skin cancer    Hyperlipidemia    Hypertension    Osteopenia     Urinary incontinence    Vitamin D  deficiency    RESOLVED     MEDICATIONS: Current Outpatient Medications on File Prior to Visit  Medication Sig Dispense Refill   aspirin  EC 81 MG tablet Take 1 tablet (81 mg total) by mouth daily. Swallow whole. 90 tablet 3   buPROPion  (WELLBUTRIN  XL) 300 MG 24 hr tablet TAKE 1 TABLET BY MOUTH EVERY DAY 90 tablet 3   Cholecalciferol (VITAMIN D3) 25 MCG (1000 UT) CAPS Take 2,000 Units by mouth daily.     cyclobenzaprine  (FLEXERIL ) 10 MG tablet Take 1 tablet (10 mg total) by mouth 3 (three) times daily as needed for muscle spasms. 60 tablet 0   esomeprazole  (NEXIUM ) 20 MG capsule Take 1 capsule (20 mg total) by mouth 2 (two) times daily before a meal. 2 capsule 0   Evolocumab  (REPATHA  SURECLICK) 140 MG/ML SOAJ Inject 140 mg into the skin every 14 (fourteen) days. 2 mL 5   ezetimibe  (ZETIA ) 10 MG tablet TAKE 1 TABLET BY MOUTH EVERY DAY 90 tablet 0   fenofibrate  160 MG tablet TAKE 1 TABLET DAILY 90 tablet 0   methylPREDNISolone  (MEDROL  DOSEPAK) 4 MG TBPK tablet As directed 21 tablet 0   metoprolol  succinate (TOPROL -XL) 25 MG 24 hr tablet TAKE 1 TABLET (25 MG TOTAL) BY MOUTH DAILY. 30 tablet 1   MYRBETRIQ  50 MG TB24 tablet Take 50 mg by mouth daily.  11   sertraline  (ZOLOFT ) 100 MG tablet TAKE 2 TABLETS BY MOUTH EVERY DAY 60 tablet 0   valsartan  (DIOVAN ) 80 MG tablet TAKE 1 TABLET BY MOUTH EVERY DAY 90 tablet 0   No current facility-administered medications on file prior to visit.    ALLERGIES: Allergies  Allergen Reactions   Ace Inhibitors     REACTION: cough   Codeine Nausea And Vomiting   Fentanyl  Itching   Statins     REACTION: MYALGIAS-muscle pain    FAMILY HISTORY: Family History  Problem Relation Age of Onset   Hypertension Mother    Osteoporosis Mother    Heart disease Father    Osteoporosis Sister    Breast cancer Paternal Aunt        Age 63's   Diabetes Paternal Uncle       Objective:  Blood pressure 127/65, pulse 64, height  5' 4 (1.626 m), weight 163 lb (73.9 kg), SpO2 96%. General: No acute distress.  Patient appears well-groomed.   Head:  Normocephalic/atraumatic Eyes:  Fundi examined but not visualized Neck: supple, no paraspinal tenderness, full range of motion Heart:  Regular rate and rhythm Neurological Exam:     09/05/2023   10:00 AM 09/05/2022   10:00 AM 03/06/2022    1:00 PM  Montreal Cognitive Assessment   Visuospatial/ Executive (0/5) 5 3 4   Naming (0/3) 3 3 3   Attention: Read list of digits (0/2) 2 2 2   Attention: Read  list of letters (0/1) 1 1 1   Attention: Serial 7 subtraction starting at 100 (0/3) 3 3 3   Language: Repeat phrase (0/2) 2 2 2   Language : Fluency (0/1) 1 1 1   Abstraction (0/2) 2 2 2   Delayed Recall (0/5) 4 2 1   Orientation (0/6) 6 6 6   Total 29 25 25   Adjusted Score (based on education)   25   alert and oriented.  Speech fluent and not dysarthric, language intact.  CN II-XII intact. Bulk and tone normal, muscle strength 5/5 throughout.  Sensation to pinprick and vibration iintact.  Deep tendon reflexes 2+ throughout, toes downgoing.  Finger to nose testing intact.  Gait steady.  Not cautious.  Able to turn.  Struggles with tandem walk, Romberg negative.   Janne Members, DO  CC: Corita Diego, MD

## 2023-09-05 ENCOUNTER — Encounter: Payer: Self-pay | Admitting: Neurology

## 2023-09-05 ENCOUNTER — Ambulatory Visit: Payer: Medicare PPO | Admitting: Neurology

## 2023-09-05 VITALS — BP 127/65 | HR 64 | Ht 64.0 in | Wt 163.0 lb

## 2023-09-05 DIAGNOSIS — R2689 Other abnormalities of gait and mobility: Secondary | ICD-10-CM

## 2023-09-05 DIAGNOSIS — R251 Tremor, unspecified: Secondary | ICD-10-CM

## 2023-09-05 DIAGNOSIS — R413 Other amnesia: Secondary | ICD-10-CM

## 2023-09-11 ENCOUNTER — Ambulatory Visit (INDEPENDENT_AMBULATORY_CARE_PROVIDER_SITE_OTHER): Admitting: Family Medicine

## 2023-09-11 DIAGNOSIS — Z0000 Encounter for general adult medical examination without abnormal findings: Secondary | ICD-10-CM

## 2023-09-11 NOTE — Progress Notes (Unsigned)
 PATIENT CHECK-IN and HEALTH RISK ASSESSMENT QUESTIONNAIRE:  -completed by phone/video for upcoming Medicare Preventive Visit  Pre-Visit Check-in: 1)Vitals (height, wt, BP, etc) - record in vitals section for visit on day of visit Request home vitals (wt, BP, etc.) and enter into vitals, THEN update Vital Signs SmartPhrase below at the top of the HPI. See below.  2)Review and Update Medications, Allergies PMH, Surgeries, Social history in Epic 3)Hospitalizations in the last year with date/reason? Last year  4)Review and Update Care Team (patient's specialists) in Epic 5) Complete PHQ9 in Epic  6) Complete Fall Screening in Epic 7)Review all Health Maintenance Due and order under PCP if not done.  Medicare Wellness Patient Questionnaire:  Answer theses question about your habits: How often do you have a drink containing alcohol? 2-4 times per month How many drinks containing alcohol do you have on a typical day when you are drinking?1-2 How often do you have six or more drinks on one occasion?never Have you ever smoked?y Quit date if applicable? 15 years ago  How many packs a day do/did you smoke? 1/2 ppd Do you use smokeless tobacco?n Do you use an illicit drugs?n On average, how many days per week do you engage in moderate to strenuous exercise (like a brisk walk)? 2  On average, how many minutes do you engage in exercise at this level?40 minutes at the gym, walking, weight, balance exercises daily Typical breakfast: cereal - special K Typical lunch: eats out a lot which is not good Typical dinner: protein and veggies Typical snacks:toast with honey  Beverages: does not drink much water, some, sweet tea Social connections: more than 3 times per week, also involved in clubs, organizations  Answer theses question about your everyday activities: Can you perform most household chores?y Are you deaf or have significant trouble hearing? Has hearing aid Do you feel that you have a  problem with memory?sometimes Do you feel safe at home?y Last dentist visit? Goes 4 x per year 8. Do you have any difficulty performing your everyday activities?n Are you having any difficulty walking, taking medications on your own, and or difficulty managing daily home needs?n Do you have difficulty walking or climbing stairs?n Do you have difficulty dressing or bathing?n Do you have difficulty doing errands alone such as visiting a doctor's office or shopping?n Do you currently have any difficulty preparing food and eating?n Do you currently have any difficulty using the toilet?n Do you have any difficulty managing your finances?n Do you have any difficulties with housekeeping of managing your housekeeping?n   Do you have Advanced Directives in place (Living Will, Healthcare Power or Attorney)? y   Last eye Exam and location? [facility], last June, plans to go again soon - has appt set   Do you currently use prescribed or non-prescribed narcotic or opioid pain medications?n  Do you have a history or close family history of breast, ovarian, tubal or peritoneal cancer or a family member with BRCA (breast cancer susceptibility 1 and 2) gene mutations?n    Nurse/Assistant Credentials/time stamp:    ----------------------------------------------------------------------------------------------------------------------------------------------------------------------------------------------------------------------  Because this visit was a virtual/telehealth visit, some criteria may be missing or patient reported. Any vitals not documented were not able to be obtained and vitals that have been documented are patient reported.    MEDICARE ANNUAL PREVENTIVE VISIT WITH PROVIDER: (Welcome to Medicare, initial annual wellness or annual wellness exam)  Virtual Visit via Video Note  I connected with Cy LITTIE Minerva on 09/13/23 by a video  enabled telemedicine application and verified that I  am speaking with the correct person using two identifiers.  Location patient: home Location provider:work or home office Persons participating in the virtual visit: patient, provider  Concerns and/or follow up today: none   See HM section in Epic for other details of completed HM.    ROS: negative for report of fevers, unintentional weight loss, vision changes, vision loss, hearing loss or change, chest pain, sob, hemoptysis, melena, hematochezia, hematuria, falls, bleeding or bruising, thoughts of suicide or self harm, memory loss  Patient-completed extensive health risk assessment - reviewed and discussed with the patient: See Health Risk Assessment completed with patient prior to the visit either above or in recent phone note. This was reviewed in detailed with the patient today and appropriate recommendations, orders and referrals were placed as needed per Summary below and patient instructions.   Review of Medical History: -PMH, PSH, Family History and current specialty and care providers reviewed and updated and listed below   Patient Care Team: Johnny Garnette LABOR, MD as PCP - General Loni Soyla LABOR, MD as PCP - Cardiology (Cardiology) Skeet Juliene SAUNDERS, DO as Consulting Physician (Neurology) Lionell Jon DEL, [facility] (Pharmacist)   Past Medical History:  Diagnosis Date   Allergy    Arthritis    Asthma    Cancer [facility])    hx skin cancer   Depression    Detrusor instability    Diverticulosis    not symptomatic   GERD (gastroesophageal reflux disease)    History of kidney stones    History of pancreatitis    April 2015   History of skin cancer    Hyperlipidemia    Hypertension    Osteopenia    Urinary incontinence    Vitamin D  deficiency    RESOLVED     Past Surgical History:  Procedure Laterality Date   ABDOMINAL HYSTERECTOMY  03/20/1992   TAH/ BLADDER SUSPENSION   BREAST BIOPSY  03/20/1970   benign   CHOLECYSTECTOMY N/A 07/12/2013   Procedure: LAPAROSCOPIC  CHOLECYSTECTOMY WITH INTRAOPERATIVE CHOLANGIOGRAM;  Surgeon: Morene ONEIDA Olives, MD;  Location: [facility];  Service: General;  Laterality: N/A;   COLONOSCOPY  06/24/2012   2022   ERCP N/A 07/14/2013   Procedure: ENDOSCOPIC RETROGRADE CHOLANGIOPANCREATOGRAPHY (ERCP);  Surgeon: Lupita FORBES Commander, MD;  Location: [facility];  Service: Endoscopy;  Laterality: N/A;   TONSILLECTOMY     TOTAL HIP ARTHROPLASTY Right 01/13/2015   Procedure: RIGHT TOTAL HIP ARTHROPLASTY ANTERIOR APPROACH;  Surgeon: Dempsey Moan, MD;  Location: [facility];  Service: Orthopedics;  Laterality: Right;   TOTAL HIP ARTHROPLASTY Left 11/07/2017   Procedure: LEFT TOTAL HIP ARTHROPLASTY ANTERIOR APPROACH;  Surgeon: Moan Dempsey, MD;  Location: [facility];  Service: Orthopedics;  Laterality: Left;   TUBAL LIGATION      Social History   Socioeconomic History   Marital status: Married    Spouse name: Not on file   Number of children: 1   Years of education: 16   Highest education level: Bachelor's degree (e.g., BA, AB, BS)  Occupational History   Occupation: retired  Tobacco Use   Smoking status: Former    Current packs/day: 0.00    Types: Cigarettes    Start date: 07/10/1966    Quit date: 07/09/2008    Years since quitting: 15.1   Smokeless tobacco: Never   Tobacco comments:    9 years ago  Vaping Use   Vaping status: Never Used  Substance and Sexual Activity  Alcohol use: Yes    Alcohol/week: 2.0 standard drinks of alcohol    Types: 2 Glasses of wine per week    Comment: OCC   Drug use: No   Sexual activity: Never    Birth control/protection: Surgical  Other Topics Concern   Not on file  Social History Narrative   Gets regular exercise   Right handed   Two story home   No caffeine      Social Drivers of Corporate investment banker Strain: Low Risk  (09/11/2023)   Overall Financial Resource Strain (CARDIA)    Difficulty of Paying Living Expenses: Not hard at all  Food Insecurity: No Food Insecurity  (09/11/2023)   Hunger Vital Sign    Worried About Running Out of Food in the Last Year: Never true    Ran Out of Food in the Last Year: Never true  Transportation Needs: No Transportation Needs (09/11/2023)   PRAPARE - Administrator, Civil Service (Medical): No    Lack of Transportation (Non-Medical): No  Physical Activity: Insufficiently Active (09/11/2023)   Exercise Vital Sign    Days of Exercise per Week: 2 days    Minutes of Exercise per Session: 40 min  Stress: No Stress Concern Present (09/11/2023)   [employer] of Occupational Health - Occupational Stress Questionnaire    Feeling of Stress: Not at all  Social Connections: Socially Integrated (09/11/2023)   Social Connection and Isolation Panel    Frequency of Communication with Friends and Family: Three times a week    Frequency of Social Gatherings with Friends and Family: Once a week    Attends Religious Services: More than 4 times per year    Active Member of [employer] or Organizations: Yes    Attends Engineer, structural: More than 4 times per year    Marital Status: Married  Catering manager Violence: Not At Risk (03/30/2021)   Humiliation, Afraid, Rape, and Kick questionnaire    Fear of Current or Ex-Partner: No    Emotionally Abused: No    Physically Abused: No    Sexually Abused: No    Family History  Problem Relation Age of Onset   Hypertension Mother    Osteoporosis Mother    Heart disease Father    Osteoporosis Sister    Breast cancer Paternal Aunt        Age 34's   Diabetes Paternal Uncle     Current Outpatient Medications on File Prior to Visit  Medication Sig Dispense Refill   aspirin  EC 81 MG tablet Take 1 tablet (81 mg total) by mouth daily. Swallow whole. 90 tablet 3   buPROPion  (WELLBUTRIN  XL) 300 MG 24 hr tablet TAKE 1 TABLET BY MOUTH EVERY DAY 90 tablet 3   Cholecalciferol (VITAMIN D3) 25 MCG (1000 UT) CAPS Take 2,000 Units by mouth daily.     esomeprazole  (NEXIUM ) 20 MG  capsule Take 1 capsule (20 mg total) by mouth 2 (two) times daily before a meal. 2 capsule 0   Evolocumab  (REPATHA  SURECLICK) 140 MG/ML SOAJ Inject 140 mg into the skin every 14 (fourteen) days. 2 mL 5   ezetimibe  (ZETIA ) 10 MG tablet TAKE 1 TABLET BY MOUTH EVERY DAY 90 tablet 0   fenofibrate  160 MG tablet TAKE 1 TABLET DAILY 90 tablet 0   methylPREDNISolone  (MEDROL  DOSEPAK) 4 MG TBPK tablet As directed 21 tablet 0   metoprolol  succinate (TOPROL -XL) 25 MG 24 hr tablet TAKE 1 TABLET (25 MG TOTAL) BY  MOUTH DAILY. 30 tablet 1   MYRBETRIQ  50 MG TB24 tablet Take 50 mg by mouth daily.  11   sertraline  (ZOLOFT ) 100 MG tablet TAKE 2 TABLETS BY MOUTH EVERY DAY 60 tablet 0   valsartan  (DIOVAN ) 80 MG tablet TAKE 1 TABLET BY MOUTH EVERY DAY 90 tablet 0   No current facility-administered medications on file prior to visit.    Allergies  Allergen Reactions   Ace Inhibitors     REACTION: cough   Codeine Nausea And Vomiting   Fentanyl  Itching   Statins     REACTION: MYALGIAS-muscle pain       Physical Exam Vitals requested from patient and listed below if patient had equipment and was able to obtain at home for this virtual visit: There were no vitals filed for this visit. Estimated body mass index is 27.98 kg/m as calculated from the following:   Height as of 09/05/23: 5' 4 (1.626 m).   Weight as of 09/05/23: 163 lb (73.9 kg).  EKG (optional): deferred due to virtual visit  GENERAL: alert, oriented, no acute distress detected, full vision exam deferred due to pandemic and/or virtual encounter  HEENT: atraumatic, conjunttiva clear, no obvious abnormalities on inspection of external nose and ears  NECK: normal movements of the head and neck  LUNGS: on inspection no signs of respiratory distress, breathing rate appears normal, no obvious gross SOB, gasping or wheezing  CV: no obvious cyanosis  MS: moves all visible extremities without noticeable abnormality  PSYCH/NEURO: pleasant and  cooperative, no obvious depression or anxiety, speech and thought processing grossly intact, Cognitive function grossly intact  Flowsheet Row Video Visit from [facility]/04/2022 in [facility]  PHQ-9 Total Score 6        09/11/2023    5:41 PM 05/04/2022   11:40 AM 04/05/2022    9:36 AM 01/16/2022   10:10 AM 09/08/2021    9:03 AM  Depression screen PHQ 2/9  Decreased Interest 0 2 2 1  0  Down, Depressed, Hopeless 0 2 0 0 0  PHQ - 2 Score 0 4 2 1  0  Altered sleeping  0 0 0 0  Tired, decreased energy  2 2 3 1   Change in appetite  0 0 0 1  Feeling bad or failure about yourself   0 1 0 0  Trouble concentrating  0 0 0 0  Moving slowly or fidgety/restless  0 0 1 0  Suicidal thoughts  0 0 0 0  PHQ-9 Score  6 5 5 2   Difficult doing work/chores  Not difficult at all Somewhat difficult Somewhat difficult Somewhat difficult       03/06/2022   12:41 PM 05/04/2022   11:40 AM 09/05/2022   10:05 AM 09/05/2023   10:21 AM 09/11/2023    5:40 PM  Fall Risk  Falls in the past year? 1 1 0 0 0  Was there an injury with Fall? 0 1 0 0 0  Was there an injury with Fall? - Comments  bruises fell down steps     Fall Risk Category Calculator 2 2 0 0 0  Fall Risk Category (Retired) Moderate       (RETIRED) Patient Fall Risk Level Moderate fall risk       Fall risk Follow up   Falls evaluation completed Falls evaluation completed Falls evaluation completed;Education provided     Data saved with a previous flowsheet row definition     SUMMARY AND PLAN:  Encounter for  Medicare annual wellness exam   Discussed applicable health maintenance/preventive health measures and advised and referred or ordered per patient preferences: -discussed vaccines due, advised can get the at the pharmacy -she reports was told by gyn does not need pap any longer,   Health Maintenance  Topic Date Due   Pneumococcal Vaccine: 50+ Years (3 of 3 - PCV20 or PCV21) 04/20/2016   COVID-19 Vaccine (7 -  2024-25 season) 11/19/2022   Cervical Cancer Screening (Pap smear)  09/10/2024 (Originally 10/26/1966)   INFLUENZA VACCINE  10/19/2023   MAMMOGRAM  11/27/2023   Medicare Annual Wellness (AWV)  09/10/2024   DTaP/Tdap/Td (2 - Td or Tdap) 01/04/2028   Colonoscopy  01/26/2030   DEXA SCAN  Completed   Hepatitis C Screening  Completed   Zoster Vaccines- Shingrix  Completed   Hepatitis B Vaccines  Aged Out   HPV VACCINES  Aged Out   Meningococcal B Vaccine  Aged Out      Education and counseling on the following was provided based on the above review of health and a plan/checklist for the patient, along with additional information discussed, was provided for the patient in the patient instructions :   -Advised and counseled on a healthy lifestyle - including the importance of a healthy diet, regular physical activity, social connections  -Reviewed patient's current diet. Advised and counseled on a whole foods based healthy diet. A summary of a healthy diet was provided in the Patient Instructions.  -reviewed patient's current physical activity level and discussed exercise guidelines for adults.Congratulated on current habits and encourage to get minimum of 150 minutes a week of exercise.  Discussed community resources and ideas for safe exercise at home to assist in meeting exercise guideline recommendations in a safe and healthy way.  -Advise yearly dental visits at minimum and regular eye exams -Advised and counseled on alcohol safe limits, risks  Follow up: see patient instructions     Patient Instructions  I really enjoyed getting to talk with you today! I am available on Tuesdays and Thursdays for virtual visits if you have any questions or concerns, or if I can be of any further assistance.   CHECKLIST FROM ANNUAL WELLNESS VISIT:  -Follow up (please call to schedule if not scheduled after visit):   -yearly for annual wellness visit with primary care office  Here is a list of your  preventive care/health maintenance measures and the plan for each if any are due:  PLAN For any measures below that may be due:   Health Maintenance  Topic Date Due   Cervical Cancer Screening (Pap smear)  Never done   Pneumococcal Vaccine: 50+ Years (3 of 3 - PCV20 or PCV21) 04/20/2016   COVID-19 Vaccine (7 - 2024-25 season) 11/19/2022   Medicare Annual Wellness (AWV)  05/05/2023   INFLUENZA VACCINE  10/19/2023   MAMMOGRAM  11/27/2023   DTaP/Tdap/Td (2 - Td or Tdap) 01/04/2028   Colonoscopy  01/26/2030   DEXA SCAN  Completed   Hepatitis C Screening  Completed   Zoster Vaccines- Shingrix  Completed   Hepatitis B Vaccines  Aged Out   HPV VACCINES  Aged Out   Meningococcal B Vaccine  Aged Out    -See a dentist at least yearly  -Get your eyes checked and then per your eye specialist's recommendations  -Other issues addressed today:   -I have included below further information regarding a healthy whole foods based diet, physical activity guidelines for adults, stress management and opportunities for  social connections. I hope you find this information useful.   -----------------------------------------------------------------------------------------------------------------------------------------------------------------------------------------------------------------------------------------------------------    NUTRITION: -eat real food: lots of colorful vegetables (half the plate) and fruits -5-7 servings of vegetables and fruits per day (fresh or steamed is best), exp. 2 servings of vegetables with lunch and dinner and 2 servings of fruit per day. Berries and greens such as kale and collards are great choices.  -consume on a regular basis:  fresh fruits, fresh veggies, fish, nuts, seeds, healthy oils (such as olive oil, avocado oil), whole grains (make sure for bread/pasta/crackers/etc., that the first ingredient on label contains the word whole), legumes. -can eat small  amounts of dairy and lean meat (no larger than the palm of your hand), but avoid processed meats such as ham, bacon, lunch meat, etc. -drink water -try to avoid fast food and pre-packaged foods, processed meat, ultra processed foods/beverages (donuts, candy, etc.) -most experts advise limiting sodium to < 2300mg  per day, should limit further is any chronic conditions such as high blood pressure, heart disease, diabetes, etc. The American Heart Association advised that < 1500mg  is is ideal -try to avoid foods/beverages that contain any ingredients with names you do not recognize  -try to avoid foods/beverages  with added sugar or sweeteners/sweets  -try to avoid sweet drinks (including diet drinks): soda, juice, Gatorade, sweet tea, power drinks, diet drinks -try to avoid white rice, white bread, pasta (unless whole grain)  EXERCISE GUIDELINES FOR ADULTS: -if you wish to increase your physical activity, do so gradually and with the approval of your doctor -STOP and seek medical care immediately if you have any chest pain, chest discomfort or trouble breathing when starting or increasing exercise  -move and stretch your body, legs, feet and arms when sitting for long periods -Physical activity guidelines for optimal health in adults: -get at least 150 minutes per week of moderate exercise (can talk, but not sing); this is about 20-30 minutes of sustained activity 5-7 days per week or two 10-15 minute episodes of sustained activity 5-7 days per week -do some muscle building/resistance training/strength training at least 2 days per week  -balance exercises 3+ days per week:   Stand somewhere where you have something sturdy to hold onto if you lose balance    1) lift up on toes, then back down, start with 5x per day and work up to 20x   2) stand and lift one leg straight out to the side so that foot is a few inches of the floor, start with 5x each side and work up to 20x each side   3) stand on one  foot, start with 5 seconds each side and work up to 20 seconds on each side  If you need ideas or help with getting more active:  -Silver sneakers https://tools.silversneakers.com  -Walk with a Doc: http://www.duncan-williams.com/  -try to include resistance (weight lifting/strength building) and balance exercises twice per week: or the following link for ideas: http://castillo-powell.com/  BuyDucts.dk  STRESS MANAGEMENT: -can try meditating, or just sitting quietly with deep breathing while intentionally relaxing all parts of your body for 5 minutes daily -if you need further help with stress, anxiety or depression please follow up with your primary doctor or contact the wonderful folks at [facility]: [phone]  SOCIAL CONNECTIONS: -options in [place] if you wish to engage in more social and exercise related activities:  -Silver sneakers https://tools.silversneakers.com  -Walk with a Doc: http://www.duncan-williams.com/  -Check out the [facility] Active Adults 50+ section on  the [employer] (hiking clubs, book clubs, cards and games, chess, exercise classes, aquatic classes and much more) - see the website for details: https://www.[place].gov/departments/parks-recreation/active-adults50  -YouTube has lots of exercise videos for different ages and abilities as well  -[facility] (a variety of indoor and outdoor inperson activities for adults). [phone]. [address]  -Virtual Online Classes (a variety of topics): see seniorplanet.org or call [phone]  -consider volunteering at a school, hospice center, church, senior center or elsewhere            Maria Schneider Cramp, DO

## 2023-09-11 NOTE — Patient Instructions (Signed)
 I really enjoyed getting to talk with you today! I am available on Tuesdays and Thursdays for virtual visits if you have any questions or concerns, or if I can be of any further assistance.   CHECKLIST FROM ANNUAL WELLNESS VISIT:  -Follow up (please call to schedule if not scheduled after visit):   -yearly for annual wellness visit with primary care office  Here is a list of your preventive care/health maintenance measures and the plan for each if any are due:  PLAN For any measures below that may be due:   Health Maintenance  Topic Date Due   Cervical Cancer Screening (Pap smear)  Never done   Pneumococcal Vaccine: 50+ Years (3 of 3 - PCV20 or PCV21) 04/20/2016   COVID-19 Vaccine (7 - 2024-25 season) 11/19/2022   Medicare Annual Wellness (AWV)  05/05/2023   INFLUENZA VACCINE  10/19/2023   MAMMOGRAM  11/27/2023   DTaP/Tdap/Td (2 - Td or Tdap) 01/04/2028   Colonoscopy  01/26/2030   DEXA SCAN  Completed   Hepatitis C Screening  Completed   Zoster Vaccines- Shingrix  Completed   Hepatitis B Vaccines  Aged Out   HPV VACCINES  Aged Out   Meningococcal B Vaccine  Aged Out    -See a dentist at least yearly  -Get your eyes checked and then per your eye specialist's recommendations  -Other issues addressed today:   -I have included below further information regarding a healthy whole foods based diet, physical activity guidelines for adults, stress management and opportunities for social connections. I hope you find this information useful.   -----------------------------------------------------------------------------------------------------------------------------------------------------------------------------------------------------------------------------------------------------------    NUTRITION: -eat real food: lots of colorful vegetables (half the plate) and fruits -5-7 servings of vegetables and fruits per day (fresh or steamed is best), exp. 2 servings of vegetables with  lunch and dinner and 2 servings of fruit per day. Berries and greens such as kale and collards are great choices.  -consume on a regular basis:  fresh fruits, fresh veggies, fish, nuts, seeds, healthy oils (such as olive oil, avocado oil), whole grains (make sure for bread/pasta/crackers/etc., that the first ingredient on label contains the word whole), legumes. -can eat small amounts of dairy and lean meat (no larger than the palm of your hand), but avoid processed meats such as ham, bacon, lunch meat, etc. -drink water -try to avoid fast food and pre-packaged foods, processed meat, ultra processed foods/beverages (donuts, candy, etc.) -most experts advise limiting sodium to < 2300mg  per day, should limit further is any chronic conditions such as high blood pressure, heart disease, diabetes, etc. The American Heart Association advised that < 1500mg  is is ideal -try to avoid foods/beverages that contain any ingredients with names you do not recognize  -try to avoid foods/beverages  with added sugar or sweeteners/sweets  -try to avoid sweet drinks (including diet drinks): soda, juice, Gatorade, sweet tea, power drinks, diet drinks -try to avoid white rice, white bread, pasta (unless whole grain)  EXERCISE GUIDELINES FOR ADULTS: -if you wish to increase your physical activity, do so gradually and with the approval of your doctor -STOP and seek medical care immediately if you have any chest pain, chest discomfort or trouble breathing when starting or increasing exercise  -move and stretch your body, legs, feet and arms when sitting for long periods -Physical activity guidelines for optimal health in adults: -get at least 150 minutes per week of moderate exercise (can talk, but not sing); this is about 20-30 minutes of sustained activity 5-7  days per week or two 10-15 minute episodes of sustained activity 5-7 days per week -do some muscle building/resistance training/strength training at least 2 days  per week  -balance exercises 3+ days per week:   Stand somewhere where you have something sturdy to hold onto if you lose balance    1) lift up on toes, then back down, start with 5x per day and work up to 20x   2) stand and lift one leg straight out to the side so that foot is a few inches of the floor, start with 5x each side and work up to 20x each side   3) stand on one foot, start with 5 seconds each side and work up to 20 seconds on each side  If you need ideas or help with getting more active:  -Silver sneakers https://tools.silversneakers.com  -Walk with a Doc: http://www.duncan-williams.com/  -try to include resistance (weight lifting/strength building) and balance exercises twice per week: or the following link for ideas: http://castillo-powell.com/  BuyDucts.dk  STRESS MANAGEMENT: -can try meditating, or just sitting quietly with deep breathing while intentionally relaxing all parts of your body for 5 minutes daily -if you need further help with stress, anxiety or depression please follow up with your primary doctor or contact the wonderful folks at [facility]: [phone]  SOCIAL CONNECTIONS: -options in [place] if you wish to engage in more social and exercise related activities:  -Silver sneakers https://tools.silversneakers.com  -Walk with a Doc: http://www.duncan-williams.com/  -Check out the [facility] Active Adults 50+ section on the [place] [employer] (hiking clubs, book clubs, cards and games, chess, exercise classes, aquatic classes and much more) - see the website for details: https://www.[place].gov/departments/parks-recreation/active-adults50  -YouTube has lots of exercise videos for different ages and abilities as well  -[facility] (a variety of indoor and outdoor inperson activities for adults). [phone]. [address]  -Virtual Online Classes (a variety of topics): see seniorplanet.org or call [phone]  -consider volunteering at a school, hospice center, church, senior center or elsewhere

## 2023-10-01 ENCOUNTER — Ambulatory Visit: Admit: 2023-10-01 | Discharge: 2023-10-01 | Payer: MEDICARE | Attending: Family | Primary: Family Medicine

## 2023-10-01 ENCOUNTER — Encounter

## 2023-10-01 DIAGNOSIS — R300 Dysuria: Principal | ICD-10-CM

## 2023-10-01 LAB — AMB POC URINALYSIS DIP STICK AUTO W/O MICRO
Glucose, Urine, POC: NEGATIVE
Ketones, Urine, POC: NEGATIVE
Nitrite, Urine, POC: NEGATIVE
Protein, Urine, POC: 300
Specific Gravity, Urine, POC: 1.015 (ref 1.001–1.035)
Urobilinogen, POC: 0.2 mg/dL (ref <1.1)
pH, Urine, POC: 7 (ref 4.6–8.0)

## 2023-10-01 MED ORDER — CEPHALEXIN 500 MG PO CAPS
500 | ORAL_CAPSULE | Freq: Two times a day (BID) | ORAL | 0 refills | 7.00000 days | Status: AC
Start: 2023-10-01 — End: 2023-10-08

## 2023-10-01 MED ORDER — PHENAZOPYRIDINE HCL 200 MG PO TABS
200 | ORAL_TABLET | Freq: Three times a day (TID) | ORAL | 0 refills | 3.00000 days | Status: AC | PRN
Start: 2023-10-01 — End: 2023-10-04

## 2023-10-01 NOTE — Progress Notes (Signed)
 Urine culture sent to lab.

## 2023-10-01 NOTE — Progress Notes (Signed)
 Date of Service: 10/01/2023  Patient: Lorraine Freeman  DOB: 1945-11-26  Chief Complaint   Patient presents with    Dysuria     For one week lower abd pain, yesterday back pain, today dysuria and frequent urination. Hx of kidney stones, pt says this feels similar. No discharge, has not taken azo etc       Patient Care Team:  Boyd Craven, APRN - NP as PCP - General (Nurse Practitioner, Family)  Veneda, Wilbert HERO, MD as PCP - Empaneled Provider  Linnie, Lani Scotts, MD as Cardiologist (Cardiology)  Dola Selinda BIRCH, MD as Consulting Provider (Pulmonology)  Pearlene Reyes Sharper, MD as Surgeon (Gastroenterology)      History of Present Illness:  Lorraine Freeman a 78 y.o. female presents today with complaint of:    Increased back pain, primarily lower abdominal pain with urinary urgency x 1 week.  States this morning she developed pain with urination and noted blood in her urine.  She has a history of kidney stones.  Denies body aches, chills, fever, nausea, vomiting.  States that she was only able to void a small amount.  Last episode of kidneys around age 75, 1 previous to that age 68 and states that she had to have surgical intervention.  The one around age 46 she passed on her own.      Current Outpatient Medications   Medication Sig Dispense Refill    clobetasol  (TEMOVATE ) 0.05 % ointment       tiZANidine (ZANAFLEX) 2 MG tablet       phenazopyridine  (PYRIDIUM ) 200 MG tablet Take 1 tablet by mouth 3 times daily as needed for Pain 9 tablet 0    cephALEXin  (KEFLEX ) 500 MG capsule Take 1 capsule by mouth 2 times daily for 7 days 14 capsule 0    omeprazole  (PRILOSEC) 40 MG delayed release capsule Take 1 capsule by mouth daily 90 capsule 3    ALPRAZolam  (XANAX ) 0.25 MG tablet Take 2 tablets by mouth 2 times daily as needed (vocal cord paralysis) for up to 90 days. Max Daily Amount: 1 mg 360 tablet 0    Bempedoic Acid  180 MG TABS Take 180 mg by mouth daily 90 tablet 3    MILK THISTLE PO Take by mouth      Plant  Sterols and Stanols (CHOLESTOFF PO) Take by mouth      vitamin B-12 (CYANOCOBALAMIN) 100 MCG tablet Vitamin B-12      MAGNESIUM PO Take by mouth      estradiol  (ESTRACE  VAGINAL) 0.1 MG/GM vaginal cream Place 1 g vaginally daily Nightly x 2 weeks, every other night x 2 weeks, then as needed 42.5 g 3    albuterol sulfate HFA (PROVENTIL;VENTOLIN;PROAIR) 108 (90 Base) MCG/ACT inhaler       vitamin D3 (CHOLECALCIFEROL) 125 MCG (5000 UT) TABS tablet   5,000 units = tabs, Oral, Daily, # 100 tabs, 0 Refill(s)      calcium carbonate 1500 (600 Ca) MG TABS tablet Take 2 tablets by mouth daily as needed      acetaminophen  (TYLENOL ) 325 MG tablet 1 tablet as needed Orally every 6 hrs      metFORMIN (GLUCOPHAGE) 850 MG tablet  (Patient not taking: Reported on 10/01/2023)      Tirzepatide -Weight Management 2.5 MG/0.5ML SOAJ Inject 2.5 mg into the skin once a week (Patient not taking: Reported on 10/01/2023) 2 mL 1    BIOTIN PO Take by mouth       No  current facility-administered medications for this visit.       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)    Restrictive lung disease    Osteoarthritis of left hip    Osteoarthritis of right hip    Hepatic steatosis    History of total shoulder replacement, right    Rotator cuff syndrome, right    Agatston CAC score, <100     Past Medical History:   Diagnosis Date    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 Ago    Erosive gastritis    Hepatic steatosis 04/06/2022    Per 03/30/22 CT    High cholesterol     Lumbar radiculopathy 07/26/2020    Lung cancer (HCC) 1993    Malignant neoplasm of skin 06/21/2020    Obesity     Osteoarthritis     Osteopenia     Osteoporosis     Patellofemoral syndrome of right knee 02/07/2021    Restrictive lung disease 03/22/2022    Rotator cuff tear 02/07/2021    Sciatica     Spasmodic dysphonia     left vocal cord paralysis    Spondylolisthesis L5     Past Surgical History:   Procedure Laterality Date    BACK SURGERY  L5    BREAST REDUCTION SURGERY      CHOLECYSTECTOMY      ELBOW SURGERY Right     EYE SURGERY      Cataracts, laser    FRACTURE SURGERY      Both wrists    HAND SURGERY  R & L    JOINT REPLACEMENT      R shoulder, R ulna    KNEE ARTHROPLASTY      Torn meniscus    KNEE ARTHROSCOPY  R knee    KNEE SURGERY  11/26/2018    Arthroscopy right knee with partial medial meniscectomy and chondroplasty Dr. Arlyss    LOBECTOMY  1980    apical resection    LUMBAR FUSION  2001    Dr. Caralee ILES SPINE SURGERY  2003    hardware removal    SHOULDER ARTHROPLASTY Right 06/2012    Dr  Price    SHOULDER SURGERY Right 1996    Dr. Arlyss    SHOULDER SURGERY Right 2011    Dr. Arlyss    SKIN CANCER EXCISION      multiple    SPINAL FUSION  L 5    SUBMANDIBULAR GLAND EXCISION      THORACOTOMY  1993    TUBAL LIGATION  1980    UPPER GASTROINTESTINAL ENDOSCOPY  03-2021    Erosive gastritis    WRIST FRACTURE SURGERY  Both    WRIST SURGERY  Bilateral     ORIF, bilat wrist fxs    WRIST SURGERY  2007    both wrist    WRIST SURGERY  2005    broken wrist     Family History   Problem Relation Age of Onset    Colon Cancer Mother     Cancer Mother         Colon    Stroke Father     Abdominal aortic aneurysm Father     Diabetes Father     High Blood Pressure Father     Prostate Cancer Father     Hypertension Father     Cancer Father         Prostate    Heart Surgery Brother     Heart Surgery Brother     Arthritis Brother         Back,legs,shoulder    High Cholesterol Brother     Esophageal Cancer Brother     Cancer Brother         Tongue    Abdominal aortic aneurysm Brother     Cancer Brother         Adenoid    Cancer Son         tongue cancer    Colon Cancer Maternal Grandmother     Breast Cancer Paternal Grandmother     Breast Cancer Maternal Aunt         Breast surgery    Cancer Maternal Aunt         Breast    Cancer Brother         Esophagus    Cancer Maternal Uncle         Lung    Cancer Maternal Uncle         Prostate    Cancer Maternal Uncle         Lung     Social History     Tobacco Use    Smoking status: Former     Current packs/day: 0.00     Average packs/day: 1 pack/day for 13.8 years (13.8 ttl pk-yrs)     Types: Cigarettes     Start date: 06/19/1963     Quit date: 03/20/1977     Years since quitting: 46.5    Smokeless tobacco: Never   Substance Use Topics    Alcohol use: Not Currently     Comment: 2/day     Social History     Social History Narrative    Not on file         ROS:    Review of Systems    See HPI, all other ROS are negative     Results for orders placed or performed in visit on 10/01/23   POC Urinalysis, Auto W/O Scope (81003)   Result Value Ref Range    Color (UA POC) Red     Clarity (UA POC) Cloudy     Glucose, Urine, POC  Negative     Bilirubin, Urine, POC Small     Ketones, Urine, POC Negative     Specific Gravity, Urine, POC 1.015 1.001 - 1.035    Blood (UA POC) Large     pH, Urine, POC 7 4.6 - 8.0    Protein, Urine, POC 300      Urobilinogen, POC 0.2 mg/dL <8.8 mg/dL    Nitrite, Urine, POC Negative Negative    Leukocyte Esterase, Urine, POC Moderate        PHYSICAL EXAM:    BP 115/71 (BP Site: Right Upper Arm, Patient Position: Sitting, BP Cuff Size: Medium Adult)   Pulse 89   Temp 97.8 F (36.6 C)   Resp 18   Ht 1.6 m (5' 3)   Wt 62.1 kg (137 lb)   LMP  (LMP Unknown)   SpO2 98%   BMI 24.27 kg/m   Vitals:    10/01/23 1135   BP: 115/71   BP Site: Right Upper Arm   Patient Position: Sitting   BP Cuff Size: Medium Adult   Pulse: 89   Resp: 18   Temp: 97.8 F (36.6 C)   SpO2: 98%   Weight: 62.1 kg (137 lb)   Height: 1.6 m (5' 3)     Results for orders placed or performed in visit on 10/01/23   POC Urinalysis, Auto W/O Scope (81003)   Result Value Ref Range    Color (UA POC) Red     Clarity (UA POC) Cloudy     Glucose, Urine, POC Negative     Bilirubin, Urine, POC Small     Ketones, Urine, POC Negative     Specific Gravity, Urine, POC 1.015 1.001 - 1.035    Blood (UA POC) Large     pH, Urine, POC 7 4.6 - 8.0    Protein, Urine, POC 300     Urobilinogen, POC 0.2 mg/dL <8.8 mg/dL    Nitrite, Urine, POC Negative Negative    Leukocyte Esterase, Urine, POC Moderate        Physical Exam  Vitals and nursing note reviewed.   Constitutional:       Appearance: Normal appearance.   HENT:      Head: Normocephalic.      Right Ear: External ear normal.      Left Ear: External ear normal.      Nose: Nose normal.      Mouth/Throat:      Mouth: Mucous membranes are moist.      Pharynx: Oropharynx is clear.   Eyes:      Extraocular Movements: Extraocular movements intact.      Conjunctiva/sclera: Conjunctivae normal.   Cardiovascular:      Rate and Rhythm: Normal rate and regular rhythm.      Pulses: Normal pulses.      Heart sounds: Normal heart sounds.   Pulmonary:      Effort: Pulmonary effort is normal.      Breath sounds: Normal breath sounds.   Abdominal:      General: Abdomen is flat.      Palpations: Abdomen is soft.      Tenderness: There is  abdominal tenderness in the suprapubic area and left lower quadrant. There is no right CVA tenderness or left CVA tenderness.   Musculoskeletal:         General: Normal range of motion.      Cervical back: Normal range of motion.   Skin:     General: Skin  is warm and dry.   Neurological:      General: No focal deficit present.      Mental Status: She is alert and oriented to person, place, and time.   Psychiatric:         Mood and Affect: Mood normal.         Behavior: Behavior normal.         ASSESSMENT and PLAN:        1. Dysuria  -     POC Urinalysis, Auto W/O Scope (81003)  -     Culture, Urine; Future  -     phenazopyridine  (PYRIDIUM ) 200 MG tablet; Take 1 tablet by mouth 3 times daily as needed for Pain, Disp-9 tablet, R-0Normal  2. Cystitis  -     cephALEXin  (KEFLEX ) 500 MG capsule; Take 1 capsule by mouth 2 times daily for 7 days, Disp-14 capsule, R-0Normal     Will treat empirically based on duration and symptoms, UA consistent with UTI, however negative for nitrites.  Kidney stones cannot be ruled out, however since urgency did present initially, will treat as if infection at this point.  Strict ED precautions to [place] at kidney stone center if pain persists despite Pyridium  and antibiotics, reevaluate in 24-hour increments.  If she is improving, but some lingering symptoms, recommended follow-up with PCP for outpatient renal ultrasound.  New Prescriptions    CEPHALEXIN  (KEFLEX ) 500 MG CAPSULE    Take 1 capsule by mouth 2 times daily for 7 days    PHENAZOPYRIDINE  (PYRIDIUM ) 200 MG TABLET    Take 1 tablet by mouth 3 times daily as needed for Pain     Requested Prescriptions     Signed Prescriptions Disp Refills    phenazopyridine  (PYRIDIUM ) 200 MG tablet 9 tablet 0     Sig: Take 1 tablet by mouth 3 times daily as needed for Pain    cephALEXin  (KEFLEX ) 500 MG capsule 14 capsule 0     Sig: Take 1 capsule by mouth 2 times daily for 7 days       Orders Placed This Encounter   Procedures    Culture,  Urine     Standing Status:   Future     Expected Date:   10/01/2023     Expiration Date:   09/30/2024    POC Urinalysis, Auto W/O Scope (81003)       No follow-ups on file.        An electronic signature was used to authenticate this note.    Dorothyann Ill APRN DNP FNP-C

## 2023-10-02 LAB — CULTURE, URINE: FINAL REPORT: NO GROWTH

## 2023-10-02 NOTE — Other (Signed)
 Your recent urine culture results are normal.

## 2023-10-04 ENCOUNTER — Other Ambulatory Visit: Payer: Self-pay | Admitting: Family Medicine

## 2023-10-12 DIAGNOSIS — H9202 Otalgia, left ear: Secondary | ICD-10-CM | POA: Diagnosis not present

## 2023-10-18 ENCOUNTER — Ambulatory Visit: Admit: 2023-10-18 | Discharge: 2023-10-18 | Payer: MEDICARE | Attending: Family Medicine | Primary: Family Medicine

## 2023-10-18 ENCOUNTER — Inpatient Hospital Stay: Admit: 2023-10-18 | Discharge: 2023-10-18 | Payer: MEDICARE | Primary: Family Medicine

## 2023-10-18 DIAGNOSIS — J3800 Paralysis of vocal cords and larynx, unspecified: Principal | ICD-10-CM

## 2023-10-18 DIAGNOSIS — L659 Nonscarring hair loss, unspecified: Principal | ICD-10-CM

## 2023-10-18 LAB — COMPREHENSIVE METABOLIC PANEL
ALT: 15 U/L (ref 0–42)
AST: 21 U/L (ref 0–46)
Albumin/Globulin Ratio: 1.71 (ref 1.00–2.70)
Albumin: 4.1 g/dL (ref 3.5–5.2)
Alk Phosphatase: 70 U/L (ref 35–117)
Anion Gap: 10 mmol/L (ref 2–17)
BUN: 20 mg/dL (ref 8–23)
CALCIUM,CORRECTED,CCA: 9.6 mg/dL (ref 8.5–10.7)
CO2: 27 mmol/L (ref 22–29)
Calcium: 9.7 mg/dL (ref 8.5–10.7)
Chloride: 104 mmol/L (ref 98–107)
Creatinine: 0.7 mg/dL (ref 0.5–1.0)
Est, Glom Filt Rate: 90 mL/min/1.73m (ref >=60)
Globulin: 2.4 g/dL (ref 1.9–4.4)
Glucose: 93 mg/dL (ref 70–99)
Osmolaliy Calculated: 283 mosm/kg (ref 270–287)
Potassium: 5.1 mmol/L (ref 3.5–5.3)
Sodium: 141 mmol/L (ref 135–145)
Total Bilirubin: 0.66 mg/dL (ref 0.00–1.20)
Total Protein: 6.5 g/dL (ref 5.7–8.3)

## 2023-10-18 LAB — TSH: TSH, 3rd Generation: 1.54 u[IU]/mL (ref 0.358–3.740)

## 2023-10-18 LAB — CBC WITH AUTO DIFFERENTIAL
Basophils %: 0.7 % (ref 0.0–2.0)
Basophils Absolute: 0 x10e3/mcL (ref 0.0–0.2)
Eosinophils %: 1.7 % (ref 0.0–7.0)
Eosinophils Absolute: 0.1 x10e3/mcL (ref 0.0–0.5)
Hematocrit: 43.2 % (ref 34.0–47.0)
Hemoglobin: 13.9 g/dL (ref 11.5–15.7)
Immature Grans (Abs): 0.01 x10e3/mcL (ref 0.00–0.06)
Immature Granulocytes %: 0.2 % (ref 0.0–0.6)
Lymphocytes Absolute: 2.2 x10e3/mcL (ref 1.0–3.2)
Lymphocytes: 37.5 % (ref 15.0–45.0)
MCH: 29.6 pg (ref 27.0–34.5)
MCHC: 32.2 g/dL (ref 30.0–36.0)
MCV: 91.9 fL (ref 81.0–99.0)
MPV: 8.8 fL (ref 7.0–12.2)
Monocytes %: 8.9 % (ref 4.0–12.0)
Monocytes Absolute: 0.5 x10e3/mcL (ref 0.3–1.0)
NRBC Absolute: 0 x10e3/mcL (ref 0.000–0.012)
NRBC Automated: 0 % (ref 0.0–0.2)
Neutrophils %: 51 % (ref 42.0–74.0)
Neutrophils Absolute: 3 x10e3/mcL (ref 1.6–7.3)
Platelets: 314 x10e3/mcL (ref 140–440)
RBC: 4.7 x10e6/mcL (ref 3.60–5.20)
RDW: 12.2 % (ref 10.0–17.0)
WBC: 6 x10e3/mcL (ref 3.8–10.6)

## 2023-10-18 LAB — LIPID PANEL
Chol/HDL Ratio: 4.1 (ref 0.0–4.4)
Cholesterol, Total: 224 mg/dL — ABNORMAL HIGH (ref 100–200)
HDL: 54 mg/dL (ref >=50)
LDL Cholesterol: 148.8 mg/dL — ABNORMAL HIGH (ref 0.0–100.0)
LDL/HDL Ratio: 2.8
Triglycerides: 106 mg/dL (ref 0–149)
VLDL: 21.2 mg/dL (ref 5.0–40.0)

## 2023-10-18 LAB — T4, FREE: T4 Free: 1.2 ng/dL (ref 0.82–1.70)

## 2023-10-18 LAB — VITAMIN D 25 HYDROXY: Vit D, 25-Hydroxy: 68.6 ng/mL (ref 30.0–90.0)

## 2023-10-18 LAB — FERRITIN: Ferritin: 628 ng/mL — ABNORMAL HIGH (ref 13.0–150.0)

## 2023-10-18 MED ORDER — ALPRAZOLAM 0.25 MG PO TABS
0.25 | ORAL_TABLET | Freq: Two times a day (BID) | ORAL | 0 refills | 30.00000 days | Status: AC | PRN
Start: 2023-10-18 — End: 2024-01-16

## 2023-10-18 NOTE — Progress Notes (Signed)
 CHIEF COMPLAINT:  Chief Complaint   Patient presents with    Follow-up     6 month        HISTORY OF PRESENT ILLNESS:  Lorraine Freeman is a 78 y.o. 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), Adhesive capsulitis of shoulder, Asthma, Bursitis, Chondromalacia of patella, right, Chronic back pain, Complex tear of medial meniscus of right knee, subsequent encounter, Complex tear of medial meniscus, current injury, right knee, subsequent encounter, Diffuse cystic mastopathy, Drug effect, Essential hypertension, Fibromyalgia, Fractures, GERD (gastroesophageal reflux disease), Hepatic steatosis, High cholesterol, Lumbar radiculopathy, Lung cancer (HCC), Malignant neoplasm of skin, Obesity, Osteoarthritis, Osteopenia, Osteoporosis, Patellofemoral syndrome of right knee, Restrictive lung disease, Rotator cuff tear, Sciatica, Spasmodic dysphonia, and Spondylolisthesis. She presents for 6 mos follow up.    GLP1: Stopped 6 weeks ago. Gained 2 lbs. Lost 40 lbs in 7 months. Takes protein shakes. Quit ETOH completely.     Kidney stone: A few weeks ago. Has had before.     Hair loss: Saw derm, wonders about labs. She's using a shampoo.     Back pain: She's been getting injections. History of sacral fracture x2-1st with childbirth, 2nd with a few years ago. Damien Darr at [facility] sports medicine-going to discuss alternative tx.     Established care:  Referred by:    Breast cancer screening: February 2025  Colon cancer screening:   DEXA: 2022  Labs: ordered  Vaccines: declines RSV, declines COVID    GYN:  Urologist:  Dermatologist:  Regular dental care:    Born/raised/relocated from:  Education:   Employment:   Legal:   Marital status: married  Children:   Home environment:   Hobbies:   Misc:  Pets:      PHQ:      04/16/2023     9:47 AM   PHQ-9    Little interest or pleasure in doing things 0   Feeling down, depressed, or hopeless 0   PHQ-2 Score 0    PHQ-9 Total Score 0        Patient-reported        CURRENT MEDICATION LIST:    Current Outpatient Medications   Medication Sig Dispense Refill    ALPRAZolam  (XANAX ) 0.25 MG tablet Take 2 tablets by mouth 2 times daily as needed (vocal cord paralysis) for up to 90 days. Max Daily Amount: 1 mg 360 tablet 0    clobetasol  (TEMOVATE ) 0.05 % ointment       tiZANidine (ZANAFLEX) 2 MG tablet       omeprazole  (PRILOSEC) 40 MG delayed release capsule Take 1 capsule by mouth daily 90 capsule 3    MAGNESIUM PO Take by mouth      BIOTIN PO Take by mouth      albuterol sulfate HFA (PROVENTIL;VENTOLIN;PROAIR) 108 (90 Base) MCG/ACT inhaler       vitamin D3 (CHOLECALCIFEROL) 125 MCG (5000 UT) TABS tablet   5,000 units = tabs, Oral, Daily, # 100 tabs, 0 Refill(s)      calcium carbonate 1500 (600 Ca) MG TABS tablet Take 2 tablets by mouth daily as needed      acetaminophen  (TYLENOL ) 325 MG tablet 1 tablet as needed Orally every 6 hrs      metFORMIN (GLUCOPHAGE) 850 MG tablet  (Patient not taking: Reported on 10/18/2023)      MILK THISTLE PO Take by mouth      Plant Sterols and Stanols (CHOLESTOFF PO) Take by  mouth      Tirzepatide -Weight Management 2.5 MG/0.5ML SOAJ Inject 2.5 mg into the skin once a week (Patient not taking: Reported on 10/01/2023) 2 mL 1    vitamin B-12 (CYANOCOBALAMIN) 100 MCG tablet Vitamin B-12 (Patient not taking: Reported on 10/18/2023)      estradiol  (ESTRACE  VAGINAL) 0.1 MG/GM vaginal cream Place 1 g vaginally daily Nightly x 2 weeks, every other night x 2 weeks, then as needed (Patient not taking: Reported on 10/18/2023) 42.5 g 3     No current facility-administered medications for this visit.        ALLERGIES:    Allergies   Allergen Reactions    Levaquin [Levofloxacin] Palpitations and Other (See Comments)     Event:        Dilaudid [Hydromorphone]     Flurbiprofen      Other reaction(s): Unknown    Nsaids      Other reaction(s): stomach upset    Ezetimibe Other (See Comments)        HISTORY:  Past Surgical History:   Procedure Laterality Date    BACK  SURGERY  L5    BREAST BIOPSY  1980    BREAST CYST EXCISION      BREAST REDUCTION SURGERY  Several    CHOLECYSTECTOMY      ELBOW SURGERY Right     EYE SURGERY      Cataracts, laser    FRACTURE SURGERY      Both wrists    HAND SURGERY  R & L    JOINT REPLACEMENT      R shoulder, R ulna    KNEE ARTHROPLASTY      Torn meniscus    KNEE ARTHROSCOPY  R knee    KNEE SURGERY  11/26/2018    Arthroscopy right knee with partial medial meniscectomy and chondroplasty Dr. Arlyss    LOBECTOMY  1980    apical resection    LUMBAR FUSION  2001    Dr. Caralee ILES SPINE SURGERY  2003    hardware removal    SHOULDER ARTHROPLASTY Right 06/2012    Dr Gretel    SHOULDER SURGERY Right 1996    Dr. Arlyss    SHOULDER SURGERY Right 2011    Dr. Arlyss    SKIN CANCER EXCISION      multiple    SPINAL FUSION  L 5    SUBMANDIBULAR GLAND EXCISION      THORACOTOMY  1993    TUBAL LIGATION  1980    UPPER GASTROINTESTINAL ENDOSCOPY  03-2021    Erosive gastritis    US  BREAST FINE NEEDLE ASPIRATION      WRIST FRACTURE SURGERY  Both    WRIST SURGERY Bilateral     ORIF, bilat wrist fxs    WRIST SURGERY  2007    both wrist    WRIST SURGERY  2005    broken wrist      Social History     Socioeconomic History    Marital status: Married     Spouse name: Not on file    Number of children: Not on file    Years of education: Not on file    Highest education level: Not on file   Occupational History    Not on file   Tobacco Use    Smoking status: Former     Current packs/day: 0.00     Average packs/day: 1 pack/day for 13.8 years (13.8 ttl pk-yrs)  Types: Cigarettes     Start date: 06/19/1963     Quit date: 03/20/1977     Years since quitting: 46.6    Smokeless tobacco: Never   Vaping Use    Vaping status: Never Used   Substance and Sexual Activity    Alcohol use: Not Currently     Comment: 2/day    Drug use: Never    Sexual activity: Not Currently     Partners: Male   Other Topics Concern    Not on file   Social History Narrative    Not on file     Social Drivers of  Health     Financial Resource Strain: Not on file   Food Insecurity: Not on file   Transportation Needs: Not on file   Physical Activity: Insufficiently Active (04/16/2023)    Exercise Vital Sign     Days of Exercise per Week: 2 days     Minutes of Exercise per Session: 30 min   Stress: Not on file   Social Connections: Not on file   Intimate Partner Violence: Not on file   Housing Stability: Not on file      Family History   Problem Relation Age of Onset    Colon Cancer Mother     Cancer Mother         Colon    Stroke Father     Abdominal aortic aneurysm Father     Diabetes Father     High Blood Pressure Father     Prostate Cancer Father     Hypertension Father     Cancer Father         Cancer prostate    Heart Surgery Brother     Heart Surgery Brother     Arthritis Brother         Radio producer, cancer lung,liver,bone    High Cholesterol Brother     Cancer Brother         Cancer of lung, liver and bone    Esophageal Cancer Brother     Cancer Brother         Tongue    Abdominal aortic aneurysm Brother     Cancer Brother         Adenoid    Cancer Son         tongue cancer    Colon Cancer Maternal Grandmother     Breast Cancer Paternal Grandmother     Breast Cancer Maternal Aunt         Breast surgery    Cancer Maternal Aunt         Breast    Cancer Brother         Esophagus    Cancer Maternal Uncle         Lung    Cancer Maternal Uncle         Prostate    Cancer Maternal Uncle         Lung    Cancer Brother         Esophageal cancer          PHYSICAL EXAM:  Physical Exam  Vitals reviewed.   Constitutional:       Appearance: Normal appearance.   HENT:      Head: Normocephalic.   Eyes:      Extraocular Movements: Extraocular movements intact.      Conjunctiva/sclera: Conjunctivae normal.   Neck:      Thyroid: No thyroid mass or thyromegaly.  Cardiovascular:      Rate and Rhythm: Normal rate and regular rhythm.      Pulses: Normal pulses.           Radial pulses are 2+ on the right side and 2+ on the left side.       Heart sounds: Normal heart sounds.   Pulmonary:      Effort: Pulmonary effort is normal.      Breath sounds: Normal breath sounds.   Musculoskeletal:      Cervical back: Normal range of motion and neck supple.      Right lower leg: No edema.      Left lower leg: No edema.   Skin:     General: Skin is warm and dry.   Neurological:      General: No focal deficit present.      Mental Status: She is alert and oriented to person, place, and time. Mental status is at baseline.   Psychiatric:         Mood and Affect: Mood normal.         Behavior: Behavior normal.          Vital Signs -   Visit Vitals  BP 121/82 (BP Site: Left Upper Arm, Patient Position: Sitting)   Pulse 68   Ht 1.6 m (5' 3)   Wt 62.2 kg (137 lb 3.2 oz)   SpO2 100%   BMI 24.30 kg/m            LABS  No results found for this visit on 10/18/23.  Orders Only on 10/01/2023   Component Date Value Ref Range Status    FINAL REPORT 10/01/2023 No growth after 18- 24 hours.   Final       IMPRESSION/PLAN    1. Vocal cord paralysis  Comments:  Exacerbated; increasing benzo to reestablish condition control  Orders:  -     ALPRAZolam  (XANAX ) 0.25 MG tablet; Take 2 tablets by mouth 2 times daily as needed (vocal cord paralysis) for up to 90 days. Max Daily Amount: 1 mg, Disp-360 tablet, R-0Normal  2. Breast cancer screening by mammogram  -     MAM TOMO DIGITAL SCREEN BILATERAL (PER PROTOCOL); Future  3. Hair loss  -     Cbc With Auto Differential; Future  -     Ferritin; Future  -     TSH; Future  -     T4, Free; Future  -     Comprehensive Metabolic Panel; Future  4. Mixed hyperlipidemia  -     Lipid Panel; Future  5. Hepatic steatosis  6. Statin intolerance  7. Vitamin D deficiency  -     Vitamin D 25 Hydroxy; Future  8. Agatston CAC score, <100  Overview:  2023: 30  9. Osteopenia of left hip  -     DEXA BONE DENSITY AXIAL SKELETON; Future  10. Respiratory syncytial virus (RSV) vaccination declined         Follow up and Dispositions:  Return in about 6 months  (around 04/19/2024) for SAWV, med check.       Dorothyann Barters, APRN - NP

## 2023-10-22 ENCOUNTER — Encounter

## 2023-10-23 NOTE — Other (Signed)
 Results noted. Message sent to patient via MyChart.

## 2023-11-02 ENCOUNTER — Other Ambulatory Visit: Payer: Self-pay | Admitting: Family Medicine

## 2023-11-10 ENCOUNTER — Other Ambulatory Visit: Payer: Self-pay | Admitting: Family Medicine

## 2023-12-03 DIAGNOSIS — Z1231 Encounter for screening mammogram for malignant neoplasm of breast: Secondary | ICD-10-CM | POA: Diagnosis not present

## 2023-12-03 LAB — HM MAMMOGRAPHY

## 2023-12-04 ENCOUNTER — Other Ambulatory Visit: Payer: Self-pay | Admitting: Family Medicine

## 2023-12-04 NOTE — Telephone Encounter (Unsigned)
 Copied from CRM [id number]. Topic: Clinical - Medication Refill >> Dec 04, 2023  1:33 PM Macario HERO wrote: Medication: sertraline  (ZOLOFT ) 100 MG tablet [[id number]]  Has the patient contacted their pharmacy? Yes (Agent: If no, request that the patient contact the pharmacy for the refill. If patient does not wish to contact the pharmacy document the reason why and proceed with request.) (Agent: If yes, when and what did the pharmacy advise?)  This is the patient's preferred pharmacy:  [id number] [address] [facility] - [address] [place] [address] Phone: [phone] Fax: [phone]   Is this the correct pharmacy for this prescription? Yes If no, delete pharmacy and type the correct one.   Has the prescription been filled recently? Yes  Is the patient out of the medication? Yes  Has the patient been seen for an appointment in the last year OR does the patient have an upcoming appointment? Yes  Can we respond through MyChart? Yes  Agent: Please be advised that Rx refills may take up to 3 business days. We ask that you follow-up with your pharmacy.

## 2023-12-06 ENCOUNTER — Inpatient Hospital Stay: Admit: 2023-12-06 | Discharge: 2023-12-06 | Payer: MEDICARE | Primary: Family Medicine

## 2023-12-06 DIAGNOSIS — K760 Fatty (change of) liver, not elsewhere classified: Principal | ICD-10-CM

## 2023-12-06 LAB — CBC WITH AUTO DIFFERENTIAL
Basophils %: 0.4 % (ref 0.0–2.0)
Basophils Absolute: 0 x10e3/mcL (ref 0.0–0.2)
Eosinophils %: 1.2 % (ref 0.0–7.0)
Eosinophils Absolute: 0.1 x10e3/mcL (ref 0.0–0.5)
Hematocrit: 44.8 % (ref 34.0–47.0)
Hemoglobin: 14.1 g/dL (ref 11.5–15.7)
Immature Grans (Abs): 0.01 x10e3/mcL (ref 0.00–0.06)
Immature Granulocytes %: 0.1 % (ref 0.0–0.6)
Lymphocytes Absolute: 1.9 x10e3/mcL (ref 1.0–3.2)
Lymphocytes: 28.2 % (ref 15.0–45.0)
MCH: 30 pg (ref 27.0–34.5)
MCHC: 31.5 g/dL (ref 30.0–36.0)
MCV: 95.3 fL (ref 81.0–99.0)
MPV: 9.1 fL (ref 7.0–12.2)
Monocytes %: 10 % (ref 4.0–12.0)
Monocytes Absolute: 0.7 x10e3/mcL (ref 0.3–1.0)
NRBC Absolute: 0 x10e3/mcL (ref 0.000–0.012)
NRBC Automated: 0 % (ref 0.0–0.2)
Neutrophils %: 60.1 % (ref 42.0–74.0)
Neutrophils Absolute: 4.1 x10e3/mcL (ref 1.6–7.3)
Platelets: 316 x10e3/mcL (ref 140–440)
RBC: 4.7 x10e6/mcL (ref 3.60–5.20)
RDW: 12.2 % (ref 10.0–17.0)
WBC: 6.8 x10e3/mcL (ref 3.8–10.6)

## 2023-12-06 LAB — COMPREHENSIVE METABOLIC PANEL
ALT: 13 U/L (ref 0–42)
AST: 18 U/L (ref 0–46)
Albumin/Globulin Ratio: 1.78 (ref 1.00–2.70)
Albumin: 4.1 g/dL (ref 3.5–5.2)
Alk Phosphatase: 62 U/L (ref 35–117)
Anion Gap: 10 mmol/L (ref 2–17)
BUN: 21 mg/dL (ref 8–23)
CALCIUM,CORRECTED,CCA: 9.8 mg/dL (ref 8.5–10.7)
CO2: 27 mmol/L (ref 22–29)
Calcium: 9.9 mg/dL (ref 8.5–10.7)
Chloride: 104 mmol/L (ref 98–107)
Creatinine: 0.9 mg/dL (ref 0.5–1.0)
Est, Glom Filt Rate: 66 mL/min/1.73mÂ² (ref >=60)
Globulin: 2.3 g/dL (ref 1.9–4.4)
Glucose: 88 mg/dL (ref 70–99)
Osmolaliy Calculated: 284 mosm/kg (ref 270–287)
Potassium: 4.4 mmol/L (ref 3.5–5.3)
Sodium: 141 mmol/L (ref 135–145)
Total Bilirubin: 0.76 mg/dL (ref 0.00–1.20)
Total Protein: 6.4 g/dL (ref 5.7–8.3)

## 2023-12-06 LAB — IRON AND TIBC
Iron % Saturation: 43 % — ABNORMAL HIGH (ref 20–40)
Iron: 120 ug/dL (ref 37–145)
TIBC: 279 ug/dL (ref 250–450)
UIBC: 159 ug/dL (ref 112.0–347.0)

## 2023-12-06 LAB — FERRITIN: Ferritin: 629 ng/mL — ABNORMAL HIGH (ref 13.0–150.0)

## 2023-12-09 NOTE — Other (Signed)
 Please schedule a visit, VV is fine, with pt to discuss recent lab results.

## 2023-12-10 ENCOUNTER — Telehealth: Admit: 2023-12-10 | Discharge: 2023-12-10 | Payer: MEDICARE | Attending: Family Medicine | Primary: Family Medicine

## 2023-12-10 ENCOUNTER — Encounter: Payer: Self-pay | Admitting: Family Medicine

## 2023-12-10 ENCOUNTER — Ambulatory Visit: Admitting: Family Medicine

## 2023-12-10 VITALS — BP 120/78 | HR 68 | Temp 98.0°F | Wt 160.0 lb

## 2023-12-10 DIAGNOSIS — R7989 Other specified abnormal findings of blood chemistry: Principal | ICD-10-CM

## 2023-12-10 DIAGNOSIS — N1832 Chronic kidney disease, stage 3b: Secondary | ICD-10-CM

## 2023-12-10 DIAGNOSIS — Z23 Encounter for immunization: Secondary | ICD-10-CM | POA: Diagnosis not present

## 2023-12-10 DIAGNOSIS — M79672 Pain in left foot: Secondary | ICD-10-CM

## 2023-12-10 DIAGNOSIS — M79671 Pain in right foot: Secondary | ICD-10-CM | POA: Diagnosis not present

## 2023-12-10 DIAGNOSIS — N183 Chronic kidney disease, stage 3 unspecified: Secondary | ICD-10-CM | POA: Insufficient documentation

## 2023-12-10 NOTE — Progress Notes (Signed)
 This is a virtual face-to-face visit and verbal consent has been obtained from the patient.  Virtual visit video component was used.  Platform used for [employer] with video component     Lorraine Freeman was evaluated through a synchronous (real-time) audio-video encounter. The patient (or guardian if applicable) is aware that this is a billable service, which includes applicable co-pays. This Virtual Visit was conducted with patient's (and/or legal guardian's) consent. The visit was conducted pursuant to the emergency declaration under the [employer] and the [employer], 1135 waiver authority and the [employer] and [employer] Act.  Patient identification was verified, and a caregiver was present when appropriate. The patient was located at Home: [address]  [place] [address] [place] [address]. Provider was located at Facility (Appt Dept): [address]  [address]  [place],  [place] [address]     CHIEF COMPLAINT:  No chief complaint on file.       HISTORY OF PRESENT ILLNESS:  Lorraine Freeman is a 78 y.o. 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), Adhesive capsulitis of shoulder, Asthma, Bursitis, Chondromalacia of patella, right, Chronic back pain, Complex tear of medial meniscus of right knee, subsequent encounter, Complex tear of medial meniscus, current injury, right knee, subsequent encounter, Diffuse cystic mastopathy, Drug effect, Essential hypertension, Fibromyalgia, Fractures, GERD (gastroesophageal reflux disease), Hepatic steatosis, High cholesterol, Lumbar radiculopathy, Lung cancer (HCC), Malignant neoplasm of skin, Obesity, Osteoarthritis, Osteopenia, Osteoporosis, Patellofemoral syndrome of right knee, Restrictive lung disease, Rotator cuff tear, Sciatica, Spasmodic dysphonia, and Spondylolisthesis. She presents to review lab  results.    Back pain: Injured her back a few weeks ago, pulled a muscle, but can't get an injection just yet as she's had one recently.    Elevated ferritin:     She is due for a screening colonoscopy in January. She is considering switching to Dr. Terra.     PHQ:      04/16/2023     9:47 AM   PHQ-9    Little interest or pleasure in doing things 0   Feeling down, depressed, or hopeless 0   PHQ-2 Score 0     PHQ-9 Total Score 0         Patient-reported    Data saved with a previous flowsheet row definition       CURRENT MEDICATION LIST:    Current Outpatient Medications   Medication Sig Dispense Refill    ALPRAZolam  (XANAX ) 0.25 MG tablet Take 2 tablets by mouth 2 times daily as needed (vocal cord paralysis) for up to 90 days. Max Daily Amount: 1 mg 360 tablet 0    clobetasol  (TEMOVATE ) 0.05 % ointment       metFORMIN (GLUCOPHAGE) 850 MG tablet  (Patient not taking: Reported on 10/18/2023)      tiZANidine (ZANAFLEX) 2 MG tablet       omeprazole  (PRILOSEC) 40 MG delayed release capsule Take 1 capsule by mouth daily 90 capsule 3    Tirzepatide -Weight Management 2.5 MG/0.5ML SOAJ Inject 2.5 mg into the skin once a week (Patient not taking: Reported on 10/01/2023) 2 mL 1    vitamin B-12 (CYANOCOBALAMIN) 100 MCG tablet Vitamin B-12 (Patient not taking: Reported on 10/18/2023)      MAGNESIUM PO Take by mouth      BIOTIN PO Take by mouth      estradiol  (ESTRACE  VAGINAL)  0.1 MG/GM vaginal cream Place 1 g vaginally daily Nightly x 2 weeks, every other night x 2 weeks, then as needed (Patient not taking: Reported on 10/18/2023) 42.5 g 3    albuterol sulfate HFA (PROVENTIL;VENTOLIN;PROAIR) 108 (90 Base) MCG/ACT inhaler       vitamin D3 (CHOLECALCIFEROL) 125 MCG (5000 UT) TABS tablet   5,000 units = tabs, Oral, Daily, # 100 tabs, 0 Refill(s)      calcium carbonate 1500 (600 Ca) MG TABS tablet Take 2 tablets by mouth daily as needed      acetaminophen  (TYLENOL ) 325 MG tablet 1 tablet as needed Orally every 6 hrs       No current  facility-administered medications for this visit.        ALLERGIES:    Allergies   Allergen Reactions    Levaquin [Levofloxacin] Palpitations and Other (See Comments)     Event:        Dilaudid [Hydromorphone]     Flurbiprofen      Other reaction(s): Unknown    Nsaids      Other reaction(s): stomach upset    Ezetimibe Other (See Comments)        HISTORY:   Past Surgical History:   Procedure Laterality Date    BACK SURGERY  L5    BREAST BIOPSY  1980    BREAST CYST EXCISION      BREAST REDUCTION SURGERY  Several    CHOLECYSTECTOMY      ELBOW SURGERY Right     EYE SURGERY      Cataracts, laser    FRACTURE SURGERY      Both wrists    HAND SURGERY  R & L    JOINT REPLACEMENT      R shoulder, R ulna    KNEE ARTHROPLASTY      Torn meniscus    KNEE ARTHROSCOPY  R knee    KNEE SURGERY  11/26/2018    Arthroscopy right knee with partial medial meniscectomy and chondroplasty Dr. Arlyss    LOBECTOMY  1980    apical resection    LUMBAR FUSION  2001    Dr. Caralee ILES SPINE SURGERY  2003    hardware removal    SHOULDER ARTHROPLASTY Right 06/2012    Dr Gretel    SHOULDER SURGERY Right 1996    Dr. Arlyss    SHOULDER SURGERY Right 2011    Dr. Arlyss    SKIN CANCER EXCISION      multiple    SPINAL FUSION  L 5    SUBMANDIBULAR GLAND EXCISION      THORACOTOMY  1993    TUBAL LIGATION  1980    UPPER GASTROINTESTINAL ENDOSCOPY  03-2021    Erosive gastritis    US  BREAST FINE NEEDLE ASPIRATION      WRIST FRACTURE SURGERY  Both    WRIST SURGERY Bilateral     ORIF, bilat wrist fxs    WRIST SURGERY  2007    both wrist    WRIST SURGERY  2005    broken wrist      Social History     Socioeconomic History    Marital status: Married     Spouse name: Not on file    Number of children: Not on file    Years of education: Not on file    Highest education level: Not on file   Occupational History    Not on file   Tobacco Use  Smoking status: Former     Current packs/day: 0.00     Average packs/day: 1 pack/day for 13.8 years (13.8 ttl pk-yrs)      Types: Cigarettes     Start date: 06/19/1963     Quit date: 03/20/1977     Years since quitting: 46.7    Smokeless tobacco: Never   Vaping Use    Vaping status: Never Used   Substance and Sexual Activity    Alcohol use: Not Currently     Comment: 2/day    Drug use: Never    Sexual activity: Not Currently     Partners: Male   Other Topics Concern    Not on file   Social History Narrative    Not on file     Social Drivers of Health     Financial Resource Strain: Not on file   Food Insecurity: Not on file   Transportation Needs: Not on file   Physical Activity: Insufficiently Active (04/16/2023)    Exercise Vital Sign     Days of Exercise per Week: 2 days     Minutes of Exercise per Session: 30 min   Stress: Not on file   Social Connections: Not on file   Intimate Partner Violence: Not on file   Housing Stability: Not on file      Family History   Problem Relation Age of Onset    Colon Cancer Mother     Cancer Mother         Colon    Stroke Father     Abdominal aortic aneurysm Father     Diabetes Father     High Blood Pressure Father     Prostate Cancer Father     Hypertension Father     Cancer Father         Cancer prostate    Heart Surgery Brother     Heart Surgery Brother     Arthritis Brother         Radio producer, cancer lung,liver,bone    High Cholesterol Brother     Cancer Brother         Cancer of lung, liver and bone    Esophageal Cancer Brother     Cancer Brother         Tongue    Abdominal aortic aneurysm Brother     Cancer Brother         Adenoid    Cancer Son         tongue cancer    Colon Cancer Maternal Grandmother     Breast Cancer Paternal Grandmother     Breast Cancer Maternal Aunt         Breast surgery    Cancer Maternal Aunt         Breast    Cancer Brother         Esophagus    Cancer Maternal Uncle         Lung    Cancer Maternal Uncle         Prostate    Cancer Maternal Uncle         Lung    Cancer Brother         Esophageal cancer            PHYSICAL EXAM:  GENERAL APPEARANCE: well developed,  well nourished  NEUROLOGIC:  nonfocal, alert and oriented.   PSYCH: Judgment and insight good, mood/affect appropriate       Vital Signs -  There were no vitals taken for this visit.         LABS  No results found for this visit on 12/10/23.  Hospital Outpatient Visit on 12/06/2023   Component Date Value Ref Range Status    Iron 12/06/2023 120  37 - 145 mcg/dL Final    UIBC 90/81/7974 159.0  112.0 - 347.0 mcg/dL Final    TIBC 90/81/7974 279  250 - 450 mcg/dL Final    Iron % Saturation 12/06/2023 43 (H)  20 - 40 % Final    Ferritin 12/06/2023 629.0 (H)  13.0 - 150.0 ng/mL Final    WBC 12/06/2023 6.8  3.8 - 10.6 x10e3/mcL Final    RBC 12/06/2023 4.70  3.60 - 5.20 x10e6/mcL Final    Hemoglobin 12/06/2023 14.1  11.5 - 15.7 g/dL Final    Hematocrit 90/81/7974 44.8  34.0 - 47.0 % Final    MCV 12/06/2023 95.3  81.0 - 99.0 fL Final    MCH 12/06/2023 30.0  27.0 - 34.5 pg Final    MCHC 12/06/2023 31.5  30.0 - 36.0 g/dL Final    RDW 90/81/7974 12.2  10.0 - 17.0 % Final    Platelets 12/06/2023 316  140 - 440 x10e3/mcL Final    MPV 12/06/2023 9.1  7.0 - 12.2 fL Final    NRBC Automated 12/06/2023 0.0  0.0 - 0.2 % Final    NRBC Absolute 12/06/2023 0.000  0.000 - 0.012 x10e3/mcL Final    Neutrophils % 12/06/2023 60.1  42.0 - 74.0 % Final    Lymphocytes 12/06/2023 28.2  15.0 - 45.0 % Final    Monocytes % 12/06/2023 10.0  4.0 - 12.0 % Final    Eosinophils % 12/06/2023 1.2  0.0 - 7.0 % Final    Basophils % 12/06/2023 0.4  0.0 - 2.0 % Final    Neutrophils Absolute 12/06/2023 4.1  1.6 - 7.3 x10e3/mcL Final    Lymphocytes Absolute 12/06/2023 1.9  1.0 - 3.2 x10e3/mcL Final    Monocytes Absolute 12/06/2023 0.7  0.3 - 1.0 x10e3/mcL Final    Eosinophils Absolute 12/06/2023 0.1  0.0 - 0.5 x10e3/mcL Final    Basophils Absolute 12/06/2023 0.0  0.0 - 0.2 x10e3/mcL Final    Immature Granulocytes % 12/06/2023 0.1  0.0 - 0.6 % Final    Immature Grans (Abs) 12/06/2023 0.01  0.00 - 0.06 x10e3/mcL Final    Sodium 12/06/2023 141  135 - 145 mmol/L  Final    Potassium 12/06/2023 4.4  3.5 - 5.3 mmol/L Final    Chloride 12/06/2023 104  98 - 107 mmol/L Final    CO2 12/06/2023 27  22 - 29 mmol/L Final    Glucose 12/06/2023 88  70 - 99 mg/dL Final    BUN 90/81/7974 21  8 - 23 mg/dL Final    Creatinine 90/81/7974 0.9  0.5 - 1.0 mg/dL Final    Anion Gap 90/81/7974 10  2 - 17 mmol/L Final    Osmolaliy Calculated 12/06/2023 284  270 - 287 mOsm/kg Final    Calcium 12/06/2023 9.9  8.5 - 10.7 mg/dL Final    CALCIUM,CORRECTED,CCA 12/06/2023 9.8  8.5 - 10.7 mg/dL Final    Total Protein 12/06/2023 6.4  5.7 - 8.3 g/dL Final    Albumin 90/81/7974 4.1  3.5 - 5.2 g/dL Final    Globulin 90/81/7974 2.3  1.9 - 4.4 g/dL Final    Albumin/Globulin Ratio 12/06/2023 1.78  1.00 - 2.70 Final    Total Bilirubin 12/06/2023 0.76  0.00 -  1.20 mg/dL Final    Alk Phosphatase 12/06/2023 62  35 - 117 unit/L Final    AST 12/06/2023 18  0 - 46 unit/L Final    ALT 12/06/2023 13  0 - 42 unit/L Final    Est, Glom Filt Rate 12/06/2023 66  >=60 mL/min/1.25m Final    Comment: VERIFIED by Discern Expert.  GFR Interpretation:                                                                         % OF  KIDNEY  GFR                                                        STAGE  FUNCTION  ==================================================================================    > 90        Normal kidney function                       STAGE 1  90-100%  89 to 60      Mild loss of kidney function                 STAGE 2  80-60%  59 to 45      Mild to moderate loss of kidney function     STAGE 3a  59-45%  44 to 30      Moderate to severe loss of kidney function   STAGE 3b  44-30%  29 to 15      Severe loss of kidney function               STAGE 4  29-15%    < 15        Kidney failure                               STAGE 5  <15%  ==================================================================================  Modified from [employer]    GFR Calculation performed using the CKD-EPI 2021 equation  developed for use  with IDMS traceable creatinine methods and                            is the calculation recommended by  the [facility] for estimating GFR in adults.         IMPRESSION/PLAN    1. Hepatic steatosis  -     US  ABDOMEN LIMITED; Future  -     Terra Maria, MD - Gastroenterology  2. Elevated ferritin  -     Terra Maria, MD - Gastroenterology  3. Family history of colon cancer in mother  -     Terra Maria, MD - Gastroenterology  4. Family history of liver cancer  -     Terra Maria, MD - Gastroenterology  5. Family history of esophageal cancer  -     Terra Maria, MD - Gastroenterology       Follow up and Dispositions:  Return if symptoms worsen or fail to  improve.       Lorraine Barters, Lorraine Freeman - NP

## 2023-12-10 NOTE — Addendum Note (Signed)
 Addended by: LADONNA INOCENTE SAILOR on: 12/10/2023 03:18 PM   Modules accepted: Orders

## 2023-12-10 NOTE — Progress Notes (Signed)
   Subjective:    Patient ID: Maria Schneider, female    DOB: May 04, 1945, 78 y.o.   MRN: [id number]  HPI Here for 2 weeks of pains in both feet and both ankles. No recent trauma, but she thinks this started after she spent a long time on her feet at a church function while wearing flat shoes. She has been treating this with [facility] and ES Tylenol . She has also been wearing more supportive shoes.    Review of Systems  Constitutional: Negative.   Respiratory: Negative.    Cardiovascular: Negative.   Musculoskeletal:  Positive for arthralgias.       Objective:   Physical Exam Constitutional:      Appearance: Normal appearance.  Cardiovascular:     Rate and Rhythm: Normal rate and regular rhythm.     Pulses: Normal pulses.     Heart sounds: Normal heart sounds.  Pulmonary:     Effort: Pulmonary effort is normal.     Breath sounds: Normal breath sounds.  Musculoskeletal:     Comments: Both feet appear normal. The right dorsal foot is mildly tender   Neurological:     Mental Status: She is alert.           Assessment & Plan:  Bilateral foot pain, likely due to OA. I advised her to always wear supportive shoes. She will recheck as needed.  Garnette Olmsted, MD

## 2023-12-12 ENCOUNTER — Inpatient Hospital Stay: Admit: 2023-12-12 | Payer: MEDICARE | Primary: Family Medicine

## 2023-12-12 DIAGNOSIS — K760 Fatty (change of) liver, not elsewhere classified: Secondary | ICD-10-CM

## 2023-12-19 DIAGNOSIS — N1832 Chronic kidney disease, stage 3b: Secondary | ICD-10-CM | POA: Diagnosis not present

## 2023-12-21 ENCOUNTER — Encounter

## 2023-12-26 DIAGNOSIS — I129 Hypertensive chronic kidney disease with stage 1 through stage 4 chronic kidney disease, or unspecified chronic kidney disease: Secondary | ICD-10-CM | POA: Diagnosis not present

## 2023-12-26 DIAGNOSIS — N1832 Chronic kidney disease, stage 3b: Secondary | ICD-10-CM | POA: Diagnosis not present

## 2023-12-27 NOTE — Result Encounter Note (Signed)
"  Results noted. Message sent to patient via MyChart.    "

## 2024-01-03 ENCOUNTER — Encounter

## 2024-01-15 ENCOUNTER — Encounter

## 2024-01-16 ENCOUNTER — Ambulatory Visit
Admit: 2024-01-16 | Discharge: 2024-01-16 | Payer: MEDICARE | Attending: Hematology & Oncology | Primary: Family Medicine

## 2024-01-16 ENCOUNTER — Other Ambulatory Visit: Admit: 2024-01-16 | Discharge: 2024-01-16 | Payer: MEDICARE | Primary: Family Medicine

## 2024-01-16 ENCOUNTER — Inpatient Hospital Stay: Admit: 2024-01-16 | Payer: MEDICARE | Primary: Family Medicine

## 2024-01-16 VITALS — BP 130/75 | HR 76 | Temp 98.20000°F | Wt 134.0 lb

## 2024-01-16 DIAGNOSIS — R7989 Other specified abnormal findings of blood chemistry: Principal | ICD-10-CM

## 2024-01-16 DIAGNOSIS — Z809 Family history of malignant neoplasm, unspecified: Principal | ICD-10-CM

## 2024-01-16 LAB — CBC WITH AUTO DIFFERENTIAL
Basophils %: 0.4 % (ref 0.0–2.0)
Basophils Absolute: 0 x10e3/mcL (ref 0–2)
Eosinophils %: 1.5 % (ref 0.0–7.0)
Eosinophils Absolute: 0.1 x10e3/mcL (ref 0.0–0.5)
Hematocrit: 42.6 % (ref 34.0–47.0)
Hemoglobin: 13.9 g/dL (ref 11.5–15.7)
Immature Grans (Abs): 0.02 x10e3/mcL (ref 0.00–0.06)
Immature Granulocytes %: 0.3 % (ref 0.0–0.6)
Lymphocytes Absolute: 2.4 x10e3/mcL (ref 1.0–3.2)
Lymphocytes: 29.7 % (ref 15.0–45.0)
MCH: 30.8 pg (ref 27.0–34.5)
MCHC: 32.6 g/dL (ref 30.0–36.0)
MCV: 94.5 fL (ref 81.0–99.0)
MPV: 9.2 fL (ref 7.0–12.2)
Monocytes %: 9.1 % (ref 4.0–12.0)
Monocytes Absolute: 0.7 x10e3/mcL (ref 0.3–1.0)
Neutrophils %: 59 % (ref 42.0–74.0)
Neutrophils Absolute: 4.7 x10e3/mcL (ref 1.6–7.3)
Platelets: 290 x10e3/mcL (ref 140–440)
RBC: 4.51 x10e3/mcL (ref 3.60–5.20)
RDW: 11.9 % (ref 10.0–17.0)
WBC: 8 x10e3/mcL (ref 3.8–10.6)

## 2024-01-16 LAB — COMPREHENSIVE METABOLIC PANEL
ALT: 12 U/L (ref 0–42)
AST: 33 U/L (ref 0–46)
Albumin/Globulin Ratio: 1.63 (ref 1.00–2.70)
Albumin: 4.4 g/dL (ref 3.5–5.2)
Alk Phosphatase: 89 U/L (ref 35–117)
Anion Gap: 8 mmol/L (ref 2–17)
BUN: 32 mg/dL — ABNORMAL HIGH (ref 8–23)
CO2: 28 mmol/L (ref 22–29)
Calcium: 9.6 mg/dL (ref 8.8–10.2)
Chloride: 103 mmol/L (ref 98–107)
Creatinine: 0.9 mg/dL (ref 0.5–1.0)
Est, Glom Filt Rate: 69 mL/min/1.73mÂ² (ref >=60)
Globulin: 2.7 g/dL (ref 1.9–4.4)
Glucose: 88 mg/dL (ref 70–99)
Potassium: 4.4 mmol/L (ref 3.5–5.3)
Sodium: 139 mmol/L (ref 135–145)
Total Bilirubin: 0.52 mg/dL (ref 0.00–1.20)
Total Protein: 7.1 g/dL (ref 6.4–8.3)

## 2024-01-16 LAB — FERRITIN: Ferritin: 515 ng/mL — ABNORMAL HIGH (ref 13.0–150.0)

## 2024-01-16 NOTE — Progress Notes (Signed)
 "        Referring Provider: Dr. Terra    PCP:  Boyd Craven, APRN - NP    Reason for Consultation: No chief complaint on file.      History of Present Illness:  The patient is a 78 y.o. female kindly referred by her gastroenterologist Dr. Terra regarding an elevated ferritin level.  I reviewed all available records and documentation including progress notes, laboratory studies, and available imaging.  She was referred to Dr. Terra for hepatic steatosis and an elevated ferritin level.  Ferritin was first noted to be elevated 10/18/2023 when it was 628.  On repeat testing 12/06/2023, persistently elevated at 629.  Abdominal ultrasound showed hepatic steatosis.  She is not taking any iron supplements.  No recent infection, no history of autoimmune disease.  She does not drink any alcohol.  No history of smoking.  Dr. Terra ordered testing for hereditary hemochromatosis, but she elected to delay as she knew that she would have labs drawn here with us  today.  She denies any known family history of iron overload or hereditary hemochromatosis.  She has a brother who she believes required phlebotomy for polycythemia.  She does have a very extensive family history of cancer.  Patient's personal medical history notable for cyst adenocarcinoma of the lung status post left lower lobe lobectomy.  She has never undergone genetic testing and says that she was offered this in the past but it was not going to be covered by insurance so she declined.  She believes she may have Lynch syndrome.  Both mother and father had cancer, 5 siblings have died of cancer.  See below for details.    Oncology History    No problem history exists.         Review of Systems;  A 14 point review of systems was obtained with pertinent positives and negatives as outlined above in HPI.      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 Ago    Erosive gastritis    Hepatic steatosis 04/06/2022    Per 03/30/22 CT    High cholesterol     Lumbar radiculopathy 07/26/2020    Lung cancer (HCC) 1993    Malignant neoplasm of skin 06/21/2020    Obesity     Osteoarthritis     Osteopenia     Osteoporosis     Patellofemoral syndrome of right knee 02/07/2021    Restrictive lung disease 03/22/2022    Rotator cuff tear 02/07/2021    Sciatica     Spasmodic dysphonia     left vocal cord paralysis    Spondylolisthesis L5     Patient Active Problem List   Diagnosis    Asthma    Lumbosacral spondylosis without myelopathy  History of renal calculi    Gastroesophageal reflux disease    Osteoarthritis of shoulder region    S/P shoulder replacement    Primary osteoarthritis of right knee    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)    Restrictive lung disease    Osteoarthritis of left hip    Osteoarthritis of right hip    Hepatic steatosis    History of total shoulder replacement, right    Rotator cuff syndrome, right    Agatston CAC score, <100        Past Surgical History:      Procedure Laterality Date    BACK SURGERY  L5    BREAST BIOPSY  1980    BREAST CYST EXCISION      BREAST REDUCTION SURGERY  Several    CHOLECYSTECTOMY      ELBOW SURGERY Right     EYE SURGERY      Cataracts, laser    FRACTURE SURGERY      Both wrists    HAND SURGERY  R & L    JOINT REPLACEMENT      R shoulder, R ulna    KNEE ARTHROPLASTY      Torn meniscus    KNEE ARTHROSCOPY  R knee    KNEE SURGERY  11/26/2018    Arthroscopy right knee  with partial medial meniscectomy and chondroplasty Dr. Arlyss    LOBECTOMY  1980    apical resection    LUMBAR FUSION  2001    Dr. Caralee ILES SPINE SURGERY  2003    hardware removal    SHOULDER ARTHROPLASTY Right 06/2012    Dr Gretel    SHOULDER SURGERY Right 1996    Dr. Arlyss    SHOULDER SURGERY Right 2011    Dr. Arlyss    SKIN CANCER EXCISION      multiple    SPINAL FUSION  L 5    SUBMANDIBULAR GLAND EXCISION      THORACOTOMY  1993    TUBAL LIGATION  1980    UPPER GASTROINTESTINAL ENDOSCOPY  03-2021    Erosive gastritis    US  BREAST FINE NEEDLE ASPIRATION      WRIST FRACTURE SURGERY  Both    WRIST SURGERY Bilateral     ORIF, bilat wrist fxs    WRIST SURGERY  2007    both wrist    WRIST SURGERY  2005    broken wrist       Family History:  Family History   Problem Relation Age of Onset    Colon Cancer Mother     Cancer Mother         Colon    Stroke Father     Abdominal aortic aneurysm Father     Diabetes Father     High Blood Pressure Father     Prostate Cancer Father     Hypertension Father     Cancer Father         Cancer prostate    Heart Surgery Brother     Heart Surgery Brother     Arthritis Brother         Back,legs,shoulder, cancer lung,liver,bone    High Cholesterol Brother     Cancer Brother         Cancer of lung, liver and bone    Esophageal Cancer Brother     Cancer Brother         Tongue    Abdominal aortic aneurysm Brother  Cancer Brother         Adenoid    Cancer Son         tongue cancer    Colon Cancer Maternal Grandmother     Breast Cancer Paternal Grandmother     Breast Cancer Maternal Aunt         Breast surgery    Cancer Maternal Aunt         Breast    Cancer Brother         Esophagus    Cancer Maternal Uncle         Lung    Cancer Maternal Uncle         Prostate    Cancer Maternal Uncle         Lung    Cancer Brother         Esophageal cancer       Medications:  Reviewed and reconciled.  Current Outpatient Medications   Medication Sig Dispense Refill    ALPRAZolam  (XANAX ) 0.25 MG tablet  Take 2 tablets by mouth 2 times daily as needed (vocal cord paralysis) for up to 90 days. Max Daily Amount: 1 mg 360 tablet 0    clobetasol  (TEMOVATE ) 0.05 % ointment       metFORMIN (GLUCOPHAGE) 850 MG tablet  (Patient not taking: Reported on 10/18/2023)      tiZANidine (ZANAFLEX) 2 MG tablet       omeprazole  (PRILOSEC) 40 MG delayed release capsule Take 1 capsule by mouth daily 90 capsule 3    vitamin B-12 (CYANOCOBALAMIN) 100 MCG tablet Vitamin B-12 (Patient not taking: Reported on 10/18/2023)      MAGNESIUM PO Take by mouth      BIOTIN PO Take by mouth      estradiol  (ESTRACE  VAGINAL) 0.1 MG/GM vaginal cream Place 1 g vaginally daily Nightly x 2 weeks, every other night x 2 weeks, then as needed (Patient not taking: Reported on 12/10/2023) 42.5 g 3    albuterol sulfate HFA (PROVENTIL;VENTOLIN;PROAIR) 108 (90 Base) MCG/ACT inhaler       vitamin D3 (CHOLECALCIFEROL) 125 MCG (5000 UT) TABS tablet   5,000 units = tabs, Oral, Daily, # 100 tabs, 0 Refill(s)      calcium carbonate 1500 (600 Ca) MG TABS tablet Take 2 tablets by mouth daily as needed      acetaminophen  (TYLENOL ) 325 MG tablet 1 tablet as needed Orally every 6 hrs       No current facility-administered medications for this visit.         Social History:  Social History     Socioeconomic History    Marital status: Married     Spouse name: Not on file    Number of children: Not on file    Years of education: Not on file    Highest education level: Not on file   Occupational History    Not on file   Tobacco Use    Smoking status: Former     Current packs/day: 0.00     Average packs/day: 1 pack/day for 13.8 years (13.8 ttl pk-yrs)     Types: Cigarettes     Start date: 06/19/1963     Quit date: 03/20/1977     Years since quitting: 46.8    Smokeless tobacco: Never   Vaping Use    Vaping status: Never Used   Substance and Sexual Activity    Alcohol use: Not Currently     Comment: 2/day    Drug  use: Never    Sexual activity: Not Currently     Partners: Male   Other  Topics Concern    Not on file   Social History Narrative    Not on file     Social Drivers of Health     Financial Resource Strain: Not on file   Food Insecurity: Not on file   Transportation Needs: Not on file   Physical Activity: Insufficiently Active (04/16/2023)    Exercise Vital Sign     Days of Exercise per Week: 2 days     Minutes of Exercise per Session: 30 min   Stress: Not on file   Social Connections: Not on file   Intimate Partner Violence: Not on file   Housing Stability: Not on file       Allergies:  Allergies   Allergen Reactions    Levaquin [Levofloxacin] Palpitations and Other (See Comments)     Event:        Dilaudid [Hydromorphone]     Flurbiprofen      Other reaction(s): Unknown    Nsaids      Other reaction(s): stomach upset    Ezetimibe Other (See Comments)       Physical Exam:  BP 130/75 (BP Site: Right Upper Arm, Patient Position: Sitting, BP Cuff Size: Medium Adult)   Pulse 76   Temp 98.2 F (36.8 C) (Temporal)   Wt 60.8 kg (134 lb)   LMP  (LMP Unknown)   SpO2 97%   BMI 23.74 kg/m   Physical Exam  Constitutional:       Appearance: Normal appearance.   HENT:      Head: Normocephalic and atraumatic.      Mouth/Throat:      Mouth: Mucous membranes are moist.   Eyes:      Extraocular Movements: Extraocular movements intact.      Conjunctiva/sclera: Conjunctivae normal.      Pupils: Pupils are equal, round, and reactive to light.   Cardiovascular:      Rate and Rhythm: Normal rate and regular rhythm.      Pulses: Normal pulses.      Heart sounds: Normal heart sounds.   Pulmonary:      Effort: Pulmonary effort is normal.      Breath sounds: Normal breath sounds.   Abdominal:      General: Bowel sounds are normal.      Palpations: Abdomen is soft.   Musculoskeletal:         General: Normal range of motion.      Cervical back: Normal range of motion.   Lymphadenopathy:      Comments: No cervical, axillary, or inguinal adenopathy   Skin:     General: Skin is warm and dry.   Neurological:       General: No focal deficit present.      Mental Status: She is alert and oriented to person, place, and time.   Psychiatric:         Mood and Affect: Mood normal.         Behavior: Behavior normal.         ECOG PS 0    Labs:    Lab on 01/16/2024   Component Date Value Ref Range Status    Sodium 01/16/2024 139  135 - 145 mmol/L Final    Potassium 01/16/2024 4.4  3.5 - 5.3 mmol/L Final    Chloride 01/16/2024 103  98 - 107 mmol/L Final    CO2  01/16/2024 28  22 - 29 mmol/L Final    Glucose 01/16/2024 88  70 - 99 mg/dL Final    BUN 89/70/7974 32 (H)  8 - 23 mg/dL Final    Creatinine 89/70/7974 0.9  0.5 - 1.0 mg/dL Final    Anion Gap 89/70/7974 8  2 - 17 mmol/L Final    Calcium 01/16/2024 9.6  8.8 - 10.2 mg/dL Final    Total Protein 01/16/2024 7.1  6.4 - 8.3 g/dL Final    Albumin 89/70/7974 4.4  3.5 - 5.2 g/dL Final    Globulin 89/70/7974 2.7  1.9 - 4.4 g/dL Final    Albumin/Globulin Ratio 01/16/2024 1.63  1.00 - 2.70 Final    Total Bilirubin 01/16/2024 0.52  0.00 - 1.20 mg/dL Final    Alk Phosphatase 01/16/2024 89  35 - 117 unit/L Final    AST 01/16/2024 33  0 - 46 unit/L Final    ALT 01/16/2024 12  0 - 42 unit/L Final    Est, Glom Filt Rate 01/16/2024 69  >=60 mL/min/1.46m Final    Comment: VERIFIED by Discern Expert.  GFR Interpretation:                                                                         % OF  KIDNEY  GFR                                                        STAGE  FUNCTION  ==================================================================================    > 90        Normal kidney function                       STAGE 1  90-100%  89 to 60      Mild loss of kidney function                 STAGE 2  80-60%  59 to 45      Mild to moderate loss of kidney function     STAGE 3a  59-45%  44 to 30      Moderate to severe loss of kidney function   STAGE 3b  44-30%  29 to 15      Severe loss of kidney function               STAGE 4  29-15%    < 15        Kidney failure                               STAGE  5  <15%  ==================================================================================  Modified from [employer]    GFR Calculation performed using the CKD-EPI 2021 equation developed for use  with IDMS traceable creatinine methods and                            is the calculation recommended by  the [employer] for  estimating GFR in adults.      WBC 01/16/2024 8.0  3.8 - 10.6 x10e3/mcL Final    RBC 01/16/2024 4.51  3.60 - 5.20 x10e3/mcL Final    Hemoglobin 01/16/2024 13.9  11.5 - 15.7 g/dL Final    Hematocrit 89/70/7974 42.6  34.0 - 47.0 % Final    MCV 01/16/2024 94.5  81.0 - 99.0 fL Final    MCH 01/16/2024 30.8  27.0 - 34.5 pg Final    MCHC 01/16/2024 32.6  30.0 - 36.0 g/dL Final    RDW 89/70/7974 11.9  10.0 - 17.0 % Final    Platelets 01/16/2024 290  140 - 440 x10e3/mcL Final    MPV 01/16/2024 9.2  7.0 - 12.2 fL Final    Neutrophils % 01/16/2024 59.0  42.0 - 74.0 % Final    Lymphocytes 01/16/2024 29.7  15.0 - 45.0 % Final    Monocytes % 01/16/2024 9.1  4.0 - 12.0 % Final    Eosinophils % 01/16/2024 1.5  0.0 - 7.0 % Final    Basophils % 01/16/2024 0.4  0.0 - 2.0 % Final    Neutrophils Absolute 01/16/2024 4.7  1.6 - 7.3 x10e3/mcL Final    Lymphocytes Absolute 01/16/2024 2.4  1.0 - 3.2 x10e3/mcL Final    Monocytes Absolute 01/16/2024 0.7  0.3 - 1.0 x10e3/mcL Final    Eosinophils Absolute 01/16/2024 0.1  0.0 - 0.5 x10e3/mcL Final    Basophils Absolute 01/16/2024 0  0 - 2 x10e3/mcL Final    Immature Granulocytes % 01/16/2024 0.3  0.0 - 0.6 % Final    Immature Grans (Abs) 01/16/2024 0.02  0.00 - 0.06 x10e3/mcL Final   Hospital Outpatient Visit on 01/16/2024   Component Date Value Ref Range Status    Ferritin 01/16/2024 515.0 (H)  13.0 - 150.0 ng/mL Final          Imaging:    No results found.     ASSESSMENT:    Ms. Perkins is a very pleasant 78 year old female seen today in consultation with an elevated ferritin level.  She also has a history of hepatic steatosis.  I have discussed  possible causes for elevated ferritin including fatty liver, infection/inflammation, and iron overload.  She does have a minimally elevated iron saturation, which suggest the possibility of underlying hemochromatosis.  I recommended genotype testing for hereditary hemochromatosis today.  If abnormal, then given that her ferritin is greater than 500 I would recommend abdominal MRI at [facility] to quantify iron content, and that we consider initiation of therapeutic phlebotomy.  If testing for hereditary hemochromatosis is negative, then her elevated ferritin may be a result of hepatic steatosis.  History of cyst adenocarcinoma of the lung status post left lower lobe lobectomy  Extensive family history of cancer.  She has a family history concerning for a hereditary cancer syndrome.  I have recommended genetic testing today.  She agrees.    PLAN:  Testing for hereditary hemochromatosis today.  Genetic testing with [facility].  Will plan to see her back in 3 weeks to review the results of workup sent today and discuss next steps in management.  All of the patient's questions have been answered and she is in agreement with plan as outlined above.  Thank you for this consultation and the opportunity to participate in this patient's care.    I have pre-screened the patient's medical records for currently enrolling clinical trials at [facility]. At this time, the patient is []  potentially eligible/ [x]  not eligible. If deemed eligible,  I have discussed the clinical trial with the patient during today's clinical visit. The Research Coordinator will be notified to determine the appropriate next steps.     Approximately spent 55 minutes on chart review as well as time spent on patient encounter, discussing the laboratory, imaging, and clinical findings. I have discussed clinical implications and recommendations on the patient's primary issues. More than 50% of time was spent counseling patient. The patient verbalized  understanding.      Thank you for allowing us  to participate in the care of Chantale Leugers, MD   01/16/24 3:14 PM                                     "

## 2024-01-17 ENCOUNTER — Inpatient Hospital Stay
Admit: 2024-01-17 | Payer: MEDICARE | Attending: Student in an Organized Health Care Education/Training Program | Primary: Family Medicine

## 2024-01-17 ENCOUNTER — Encounter

## 2024-01-17 DIAGNOSIS — N281 Cyst of kidney, acquired: Secondary | ICD-10-CM

## 2024-01-21 LAB — HERED. HEMOCHROMATOSIS, DNA

## 2024-01-25 ENCOUNTER — Other Ambulatory Visit: Payer: Self-pay | Admitting: Internal Medicine

## 2024-01-25 DIAGNOSIS — I2510 Atherosclerotic heart disease of native coronary artery without angina pectoris: Secondary | ICD-10-CM

## 2024-01-31 ENCOUNTER — Ambulatory Visit: Payer: Self-pay | Admitting: *Deleted

## 2024-01-31 NOTE — Telephone Encounter (Signed)
 FYI Only or Action Required?: FYI only for provider: appointment scheduled on 11/14.  Patient was last seen in primary care on 12/10/2023 by Maria Schneider LABOR, MD.  Called Nurse Triage reporting Head Injury.  Symptoms began a week ago.  Interventions attempted: Nothing.  Symptoms are: gradually improving.  Triage Disposition: See HCP Within 4 Hours (Or PCP Triage)  Patient/caregiver understands and will follow disposition?: Yes   Patient prefers to see PCP- states she has waited this long- one more day will not matter, no other symptoms present. Advised report any changes/new symptoms  Copied from CRM [id number]. Topic: Clinical - Red Word Triage >> Jan 31, 2024  8:50 AM Robinson H wrote: [facility] that prompted transfer to Nurse Triage: Clemens on vacation a week ago has a nice size bump on head back right top. Clemens forward and turned head. No symptoms but bump on head is sore. Reason for Disposition  [1] Age over 64 years AND [2] swelling or bruise  Answer Assessment - Initial Assessment Questions 1. MECHANISM: How did the injury happen? For falls, ask: What height did you fall from? and What surface did you fall against?      Clemens forward- on curb- hit head on concrete- [place], Patient was not evaluated after injury 2. ONSET: When did the injury happen? (e.g., minutes, hours ago)      1 week ago 3. NEUROLOGIC SYMPTOMS: Was there any loss of consciousness? Are there any other neurological symptoms?      Head ache initially- now just sore 4. MENTAL STATUS: Does the person know who they are, who you are, and where they are?      Alert and oriented 5. LOCATION: What part of the head was hit?      Back R skull 6. SCALP APPEARANCE: What does the scalp look like? Is it bleeding now? If Yes, ask: Is it difficult to stop?      Initially red mark -quarter size- smaller now, patient has bump on head- egg size- it has decreased small egg size 7.  SIZE: For cuts, bruises, or swelling, ask: How large is it? (e.g., inches or centimeters)      See above 8. PAIN: Is there any pain? If Yes, ask: How bad is it? (Scale 0-10; or none, mild, moderate, severe)     Pain with touch, mild 10. BLOOD THINNERS: Do you take any blood thinners? (e.g., aspirin , clopidogrel / Plavix, coumadin, heparin). Notes: Other strong blood thinners include: Arixtra (fondaparinux), Eliquis (apixaban), Pradaxa (dabigatran), and Xarelto  (rivaroxaban ).       no 11. OTHER SYMPTOMS: Do you have any other symptoms? (e.g., neck pain, vomiting)       no  Protocols used: Head Injury-A-AH

## 2024-02-01 ENCOUNTER — Ambulatory Visit: Admitting: Family Medicine

## 2024-02-01 ENCOUNTER — Encounter: Payer: Self-pay | Admitting: Family Medicine

## 2024-02-01 VITALS — BP 110/76 | HR 56 | Temp 97.8°F | Wt 159.6 lb

## 2024-02-01 DIAGNOSIS — S0003XA Contusion of scalp, initial encounter: Secondary | ICD-10-CM | POA: Diagnosis not present

## 2024-02-01 NOTE — Progress Notes (Signed)
   Subjective:    Patient ID: Maria Schneider, female    DOB: June 21, 1945, 78 y.o.   MRN: [id number]  HPI Here to follow up from a fall she had 10 days ago while on vacation in [place], [place]. While she was carrying luggage from her car to the hotel, she tripped over a curb and fell forward. She struck the back of her head on the ground but avoided other injuries because the luggage cushioned her fall. There was no LOC. She quickly developed a tender goose egg on the back of her head, but this has been shrinking in size. She has had no other symptoms, including headaches or light sensitivity or dizziness.    Review of Systems  Constitutional: Negative.   Respiratory: Negative.    Cardiovascular: Negative.   Neurological: Negative.        Objective:   Physical Exam Constitutional:      Appearance: Normal appearance.  Cardiovascular:     Rate and Rhythm: Normal rate and regular rhythm.     Pulses: Normal pulses.     Heart sounds: Normal heart sounds.  Pulmonary:     Effort: Pulmonary effort is normal.     Breath sounds: Normal breath sounds.  Musculoskeletal:     Comments: There is a 2 cm area of swelling and tenderness over the posterior scalp  Neurological:     General: No focal deficit present.     Mental Status: She is alert and oriented to person, place, and time.     Coordination: Coordination normal.     Gait: Gait normal.           Assessment & Plan:  Head contusion. She seems to be healing nicely.  Garnette Olmsted, MD

## 2024-02-01 NOTE — Telephone Encounter (Signed)
 Pt had an appointment with Dr Johnny today

## 2024-02-04 ENCOUNTER — Telehealth: Payer: Self-pay | Admitting: Internal Medicine

## 2024-02-04 DIAGNOSIS — I2510 Atherosclerotic heart disease of native coronary artery without angina pectoris: Secondary | ICD-10-CM

## 2024-02-04 MED ORDER — REPATHA SURECLICK 140 MG/ML ~~LOC~~ SOAJ: 140.0000 mg | SUBCUTANEOUS | 0 refills | Status: AC

## 2024-02-04 NOTE — Telephone Encounter (Signed)
*  STAT* If patient is at the pharmacy, call can be transferred to refill team.   1. Which medications need to be refilled? (please list name of each medication and dose if known) Evolocumab  (REPATHA  SURECLICK) 140 MG/ML SOAJ   2. Which pharmacy/location (including street and city if local pharmacy) is medication to be sent to? [id number] - [place], [place] - [address]   3. Do they need a 30 day or 90 day supply? 90

## 2024-02-05 ENCOUNTER — Ambulatory Visit: Admit: 2024-02-05 | Discharge: 2024-02-06 | Payer: MEDICARE | Attending: Adult Health | Primary: Family Medicine

## 2024-02-05 VITALS — BP 114/75 | HR 75 | Temp 97.20000°F | Wt 142.8 lb

## 2024-02-05 DIAGNOSIS — R7989 Other specified abnormal findings of blood chemistry: Principal | ICD-10-CM

## 2024-02-05 NOTE — Progress Notes (Signed)
 "        Referring Provider: Dr. Terra    PCP:  Boyd Craven, APRN - NP    Hematology History:   The patient is a pleasant female kindly referred by her gastroenterologist Dr. Terra regarding an elevated ferritin level.  She was referred to Dr. Terra for hepatic steatosis and an elevated ferritin level.  Ferritin was first noted to be elevated 10/18/2023 when it was 628.  On repeat testing 12/06/2023, persistently elevated at 629.  Abdominal ultrasound showed hepatic steatosis.    Dr. Terra ordered testing for hereditary hemochromatosis, but she elected to delay as she knew that she would have labs drawn here with us  today.  She denies any known family history of iron overload or hereditary hemochromatosis.  She has a brother who she believes required phlebotomy for polycythemia.    Hemochromatosis gene analysis revealed heterozygous c.187C>G    History of Present Illness:  The patient is a pleasant 78 year old female who presents for ongoing management of elevated ferritin and to review studies from initial evaluation.  She reports feeling generally well.  She has checked her supplements and there is a small amount of iron in the neutrophil that she takes in a small amount of iron and protein drinks she has on a regular basis.  She has no complaints today.  She reports feeling generally well.        Review of Systems;  A 14 point review of systems was obtained with pertinent positives and negatives as outlined above in HPI.      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 Ago    Erosive gastritis    Hepatic steatosis 04/06/2022    Per 03/30/22 CT    High cholesterol     Lumbar radiculopathy 07/26/2020    Lung cancer (HCC) 1993    Malignant neoplasm of skin 06/21/2020    Obesity     Osteoarthritis     Osteopenia     Osteoporosis     Patellofemoral syndrome of right knee 02/07/2021    Restrictive lung disease 03/22/2022    Rotator cuff tear 02/07/2021    Sciatica     Spasmodic dysphonia     left vocal cord paralysis    Spondylolisthesis L5     Patient Active Problem List   Diagnosis    Asthma    Lumbosacral spondylosis without myelopathy    History of renal calculi    Gastroesophageal reflux disease    Osteoarthritis of shoulder region    S/P shoulder replacement    Primary osteoarthritis of right knee    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)    Restrictive lung disease    Osteoarthritis of left hip    Osteoarthritis of right hip  Hepatic steatosis    History of total shoulder replacement, right    Rotator cuff syndrome, right    Agatston CAC score, <100        Past Surgical History:      Procedure Laterality Date    BACK SURGERY  L5    BREAST BIOPSY  1980    BREAST CYST EXCISION      BREAST REDUCTION SURGERY  Several    CHOLECYSTECTOMY      ELBOW SURGERY Right     EYE SURGERY      Cataracts, laser    FRACTURE SURGERY      Both wrists    HAND SURGERY  R & L    JOINT REPLACEMENT      R shoulder, R ulna    KNEE ARTHROPLASTY      Torn meniscus    KNEE ARTHROSCOPY  R knee    KNEE SURGERY  11/26/2018    Arthroscopy right knee with partial medial meniscectomy and chondroplasty Dr. Arlyss    LOBECTOMY  1980    apical resection    LUMBAR FUSION  2001    Dr. Caralee ILES SPINE SURGERY  2003    hardware removal    SHOULDER ARTHROPLASTY Right 06/2012    Dr Gretel    SHOULDER SURGERY Right 1996    Dr. Arlyss    SHOULDER SURGERY Right 2011    Dr. Arlyss     SKIN CANCER EXCISION      multiple    SPINAL FUSION  L 5    SUBMANDIBULAR GLAND EXCISION      THORACOTOMY  1993    TUBAL LIGATION  1980    UPPER GASTROINTESTINAL ENDOSCOPY  03-2021    Erosive gastritis    US  BREAST FINE NEEDLE ASPIRATION      WRIST FRACTURE SURGERY  Both    WRIST SURGERY Bilateral     ORIF, bilat wrist fxs    WRIST SURGERY  2007    both wrist    WRIST SURGERY  2005    broken wrist       Family History:  Family History   Problem Relation Age of Onset    Colon Cancer Mother     Cancer Mother         Colon    Stroke Father     Abdominal aortic aneurysm Father     Diabetes Father     High Blood Pressure Father     Prostate Cancer Father     Hypertension Father     Cancer Father         Cancer prostate    Heart Surgery Brother     Heart Surgery Brother     Arthritis Brother         Back,legs,shoulder, cancer lung,liver,bone    High Cholesterol Brother     Cancer Brother         Cancer of lung, liver and bone    Esophageal Cancer Brother     Cancer Brother         Tongue    Abdominal aortic aneurysm Brother     Cancer Brother         Adenoid    Cancer Son         tongue cancer    Colon Cancer Maternal Grandmother     Breast Cancer Paternal Grandmother     Breast Cancer Maternal Aunt         Breast surgery  Cancer Maternal Aunt         Breast    Cancer Brother         Esophagus    Cancer Maternal Uncle         Lung    Cancer Maternal Uncle         Prostate    Cancer Maternal Uncle         Lung    Cancer Brother         Esophageal cancer       Medications:  Reviewed and reconciled.  Current Outpatient Medications   Medication Sig Dispense Refill    ALPRAZolam  (XANAX ) 0.25 MG tablet Take 2 tablets by mouth 2 times daily as needed (vocal cord paralysis) for up to 90 days. Max Daily Amount: 1 mg 360 tablet 0    clobetasol  (TEMOVATE ) 0.05 % ointment       metFORMIN (GLUCOPHAGE) 850 MG tablet  (Patient not taking: Reported on 10/18/2023)      tiZANidine (ZANAFLEX) 2 MG tablet       omeprazole  (PRILOSEC) 40 MG  delayed release capsule Take 1 capsule by mouth daily 90 capsule 3    vitamin B-12 (CYANOCOBALAMIN) 100 MCG tablet Vitamin B-12 (Patient not taking: Reported on 10/18/2023)      MAGNESIUM PO Take by mouth      BIOTIN PO Take by mouth      estradiol  (ESTRACE  VAGINAL) 0.1 MG/GM vaginal cream Place 1 g vaginally daily Nightly x 2 weeks, every other night x 2 weeks, then as needed (Patient not taking: Reported on 12/10/2023) 42.5 g 3    albuterol sulfate HFA (PROVENTIL;VENTOLIN;PROAIR) 108 (90 Base) MCG/ACT inhaler       vitamin D3 (CHOLECALCIFEROL) 125 MCG (5000 UT) TABS tablet   5,000 units = tabs, Oral, Daily, # 100 tabs, 0 Refill(s)      calcium carbonate 1500 (600 Ca) MG TABS tablet Take 2 tablets by mouth daily as needed      acetaminophen  (TYLENOL ) 325 MG tablet 1 tablet as needed Orally every 6 hrs       No current facility-administered medications for this visit.         Social History:  Social History     Socioeconomic History    Marital status: Married     Spouse name: Not on file    Number of children: Not on file    Years of education: Not on file    Highest education level: Not on file   Occupational History    Not on file   Tobacco Use    Smoking status: Former     Current packs/day: 0.00     Average packs/day: 1 pack/day for 13.8 years (13.8 ttl pk-yrs)     Types: Cigarettes     Start date: 06/19/1963     Quit date: 03/20/1977     Years since quitting: 46.9    Smokeless tobacco: Never   Vaping Use    Vaping status: Never Used   Substance and Sexual Activity    Alcohol use: Not Currently     Comment: 2/day    Drug use: Never    Sexual activity: Not Currently     Partners: Male   Other Topics Concern    Not on file   Social History Narrative    Not on file     Social Drivers of Health     Financial Resource Strain: Not on file   Food Insecurity: Not on file  Transportation Needs: Not on file   Physical Activity: Insufficiently Active (04/16/2023)    Exercise Vital Sign     Days of Exercise per Week: 2 days      Minutes of Exercise per Session: 30 min   Stress: Not on file   Social Connections: Not on file   Intimate Partner Violence: Not on file   Housing Stability: Not on file       Allergies:  Allergies   Allergen Reactions    Levaquin [Levofloxacin] Palpitations and Other (See Comments)     Event:        Dilaudid [Hydromorphone]     Flurbiprofen      Other reaction(s): Unknown    Nsaids      Other reaction(s): stomach upset    Ezetimibe Other (See Comments)       Physical Exam:  BP 114/75 (BP Site: Left Upper Arm, Patient Position: Sitting, BP Cuff Size: Medium Adult)   Pulse 75   Temp 97.2 F (36.2 C) (Temporal)   Wt 64.8 kg (142 lb 12.8 oz)   LMP  (LMP Unknown)   SpO2 98%   BMI 25.30 kg/m   Physical Exam  Constitutional:       Appearance: Normal appearance.   HENT:      Head: Normocephalic and atraumatic.      Mouth/Throat:      Mouth: Mucous membranes are moist.   Eyes:      Extraocular Movements: Extraocular movements intact.      Conjunctiva/sclera: Conjunctivae normal.      Pupils: Pupils are equal, round, and reactive to light.   Cardiovascular:      Pulses: Normal pulses.      Heart sounds: Normal heart sounds.   Pulmonary:      Effort: Pulmonary effort is normal.   Abdominal:      General: Abdomen is flat.   Musculoskeletal:         General: Normal range of motion.      Cervical back: Normal range of motion.   Lymphadenopathy:      Comments: No cervical, axillary, or inguinal adenopathy   Skin:     General: Skin is warm and dry.   Neurological:      General: No focal deficit present.      Mental Status: She is alert and oriented to person, place, and time.   Psychiatric:         Mood and Affect: Mood normal.         Behavior: Behavior normal.         ECOG PS 0    Labs:    No visits with results within 2 Day(s) from this visit.   Latest known visit with results is:   Hospital Outpatient Visit on 01/16/2024   Component Date Value Ref Range Status    Ferritin 01/16/2024 515.0 (H)  13.0 - 150.0 ng/mL Final     Hemochromatosis Gene Analysis 01/16/2024 Comment   Final    Comment: Results:  c.845G>A (p.Cys282Tyr) - Not Detected  c.187C>G (p.His63Asp) - Detected, heterozygous  c.193A>T (p.Ser65Cys) - Not Detected  Not associated with increased risk to develop clinical  symptoms of Hereditary Hemochromatosis. In symptomatic  individuals, other causes of iron overload should be  evaluated. See Additional Information and Comments.    Additional Clinical Information:  Hereditary hemochromatosis (HFE related) is an autosomal  recessive iron storage disorder. Patients may have a  genetic diagnosis of hereditary hemochromatosis and never  show clinical  symptoms. Clinical symptoms typically appear  between 40 to 60 years in males and after menopause in  females. Signs and symptoms may include organ damage,  primarily in the liver, risk for hepatocellular  carcinoma, diabetes, and heart disease due to iron  accumulation. Life expectancy may be decreased in  individuals who develop cirrhosis. Treatment for  clinically symptomatic individuals may include  therapeutic phlebo                           tomy. Liver transplant may be used to  treat end stage liver failure. For preventive care,  monitoring for iron overload is recommended for patients  who are homozygous for c.845G>A (p.Cys282Tyr) and have yet  to experience clinical symptoms.    Comments:  The most common HFE variants associated with hereditary  hemochromatosis are c.845G>A (p.Cys282Tyr), c.187C>G  (p.His63Asp), c.193A>T (p.Ser65Cys). While patients  homozygous for c.845G>A (p.Cys282Tyr) are the most likely  to present clinical symptoms, less than 10% develop  clinically significant iron overload with tissue and organ  damage.    Genetic counseling is recommended to discuss the potential  clinical implications of positive results, as well as  recommendations for testing family members.  Genetic Coordinators are available for health care  providers to discuss results at  [phone]).    Test Details:  Three variants analyzed:  c.845G>A (p.Cys282Tyr), commonly referred to as C282Y  c.187C>G (p.His63Asp), commonl                           y referred to as H63D  c.193A>T (p.Ser65Cys), commonly referred to as S65C    Methods/Limitations:  DNA Analysis of the HFE gene (NM_000410.4) was performed  by PCR amplification followed by restriction enzyme  digestion analyses. Results must be combined with clinical  information for the most accurate interpretation. Molecular-  based testing is highly accurate, but as in any laboratory  test, diagnostic errors may occur. False positive or false  negative results may occur for reasons that include genetic  variants, blood transfusions, bone marrow transplantation,  somatic or tissue-specific mosaicism, mislabeled samples,  or erroneous representation of family relationships.  This test was developed and its performance  characteristics determined by [facility]. It has not been  cleared or approved by the Food and Drug Administration.    References:  [place] CAVES, [address], [place] SONNA Monte LW, [place] AS;  American Association for the Study of Liver Diseases.  Diagnosis and management of h                           emochromatosis: 2011 practice  guideline by the American Association for the Study of  Liver Diseases. Hepatology. 2011 Jul;54(1):328-43. doi:  10.1002/hep.24330. PMID: 78547709; PMCID: EFR6850874.  [address] Brissot P, Swinkels DW, Zoller H, Kamarainen O,  Patton S, Alonso I, Morris M, Keeney S. EMQN best practice  guidelines for the molecular genetic diagnosis of  hereditary hemochromatosis [facility]). Eur J Hum Genet. 2016  Apr;24(4):479-95. doi: 10.1038/ejhg.2015.128. Epub 2015 Jul  8. PMID: 73846781; PMCID: EFR5070138.      Reviewed By, [facility]: 01/16/2024 Comment   Final    Comment: Technical Component performed at [facility]  Professional Component performed by:  Fairy WENDI Aid, PhD, [facility]  [id number], [facility], [address]  [place] [address]  Performed At: [facility]  [address]  [place], [place] [id number]  Loran Gales MDPhD Ey:[phone]            Imaging:    CT ABDOMEN PELVIS WO CONTRAST Additional Contrast? None  Result Date: 01/17/2024  CT abdomen pelvis without contrast: 01/17/24 INDICATION: KIDNEY CYST COMPARISON: Ultrasound of the abdomen 12/12/2023. CT of the chest 03/29/2022. TECHNIQUE: Routine noncontrast protocol (Axial imaging from the lung bases to the pubic symphysis with coronal reconstruction.) CT scanning was performed using radiation dose reduction techniques when appropriate, per system protocols. FINDINGS: LOWER THORAX: MR appearance of postsurgical change at the left lung base with associated prominent subpleural fat and atelectasis. ABDOMEN AND PELVIS: Please note there is partially limited evaluation of intra-abdominal contents given lack of intravenous contrast. LIVER: No contour deforming liver lesions. BILIARY SYSTEM: Gallbladder is surgically absent. No biliary ductal dilation. SPLEEN: No splenomegaly. PANCREAS: No main duct dilatation. ADRENALS: No nodule. URINARY SYSTEM: There is a dominant exophytic simple cyst by Hounsfield units along the posterior aspect of the upper pole of the left kidney which measures 3.5 x 3.0 x 3.1 cm in size. No hydronephrosis or hydroureter. The bladder is decompressed. STOMACH/BOWEL: No bowel obstruction. No pneumoperitoneum. Moderate volume of stool throughout the colon. ABDOMINOPELVIC WALL/PERITONEUM: No ascites. VASCULAR: Aorta is normal in caliber. Moderate atherosclerotic ossifications are  present. PELVIC ORGANS: Anteflexed uterus. Calcification at the uterine fundus, likely involuted fibroid. LYMPH NODES: No adenopathy. BONES/SOFT TISSUES: No acute osseous abnormality. Multilevel degenerative disc disease most prominent level of L5-S1. Lower lumbar facet arthropathy.     1.  Please note there is partially limited evaluation of intra-abdominal contents given lack  of intravenous contrast. 2.  There is a dominant exophytic simple cyst by Hounsfield units along the posterior aspect of the upper pole of the left kidney which measures 3.5 x 3.0 x  3.1 cm in size.        ASSESSMENT:    Ms. Lorraine Freeman is a very pleasant 78 year old female with an elevated ferritin level.  She also has a history of hepatic steatosis.  She has a minimally elevated iron saturation, which suggest the possibility of underlying hemochromatosis.  Genotype testing for hereditary hemochromatosis heterozygous and 187C gene mutation.    History of cyst adenocarcinoma of the lung status post left lower lobe lobectomy  Extensive family history of cancer.  Genetic testing with [facility] negative.    PLAN:  We reviewed results from hemochromatosis testing.  While carriers for hemochromatosis do not usually go on to develop iron overload, given her ferritin greater than 500, Dr. Terrea has recommended MRI liver at [facility] to check for iron overload and quantify the amount of iron.  Patient I reviewed this recommendation and she is amenable to this plan.   We reviewed [facility] results and she is given a copy of this today.  She should continue age-appropriate cancer screenings.  Neutra foil and her nutritional shakes contain a small amount of iron.  We discussed discontinuing the NeutrapHor oil and possibly considering discontinuing and shakes.  She is very hesitant to do this at this time.  She has alopecia NeutrapHor oil has helped her.  We will hold off on stopping these until we have received the results of the MRI  She return in approximately 3 weeks to review the results of MRI.  All of the patient's questions have been answered and she is in agreement with plan as outlined above.  Thank you for this consultation and the opportunity to participate in this patient's care.  I have pre-screened the patient's medical records for currently enrolling clinical trials at [facility]. At this time, the patient is []  potentially  eligible/ [x]  not eligible. If deemed eligible, I have discussed the clinical trial with the patient during today's clinical visit. The Research Coordinator will be notified to determine the appropriate next steps.     Approximately spent 30 minutes on chart review as well as time spent on patient encounter, discussing the laboratory, imaging, and clinical findings. I have discussed clinical implications and recommendations on the patient's primary issues. More than 50% of time was spent counseling patient. The patient verbalized understanding.      Thank you for allowing us  to participate in the care of Jlee Harkless, APRN - CNP   02/05/24 2:26 PM                                     "

## 2024-02-06 ENCOUNTER — Encounter

## 2024-02-06 ENCOUNTER — Inpatient Hospital Stay: Admit: 2024-02-06 | Discharge: 2024-02-06 | Payer: MEDICARE | Primary: Family Medicine

## 2024-02-06 DIAGNOSIS — Z800 Family history of malignant neoplasm of digestive organs: Principal | ICD-10-CM

## 2024-02-06 DIAGNOSIS — R7989 Other specified abnormal findings of blood chemistry: Secondary | ICD-10-CM

## 2024-02-08 LAB — NASH FIBROSUREÂ® PLUS
ALT (SGPT) P5P: 14 IU/L (ref 0–40)
AST (SGOT) P5P: 22 IU/L (ref 0–40)
Alpha 2-Macroglobulins, Qn: 210 mg/dL (ref 110–276)
Apolipoprotein A-1: 149 mg/dL (ref 116–209)
BILIRUBIN, TOTAL (NASH), 13917: 0.5 mg/dL (ref 0.0–1.2)
Cholesterol, Total  (NASH): 242 mg/dL — ABNORMAL HIGH (ref 100–199)
Fibrosis Score NASH Plus: 0.36 — ABNORMAL HIGH (ref 0.00–0.21)
GGT: 20 IU/L (ref 0–60)
Haptoglobin: 73 mg/dL (ref 42–346)
NASH Glucose Serum: 100 mg/dL — ABNORMAL HIGH (ref 70–99)
NASH Score Plus: 0.48 — ABNORMAL HIGH (ref 0.00–0.25)
Steatosis Score NASH Plus: 0.46 — ABNORMAL HIGH (ref 0.00–0.40)
Triglycerides: 124 mg/dL (ref 0–149)

## 2024-03-18 ENCOUNTER — Ambulatory Visit: Admit: 2024-03-18 | Discharge: 2024-03-18 | Payer: MEDICARE | Primary: Family Medicine

## 2024-03-18 ENCOUNTER — Ambulatory Visit: Admit: 2024-03-18 | Discharge: 2024-03-18 | Payer: MEDICARE | Attending: Adult Health | Primary: Family Medicine

## 2024-03-18 ENCOUNTER — Inpatient Hospital Stay: Admit: 2024-03-18 | Payer: MEDICARE | Primary: Family Medicine

## 2024-03-18 VITALS — BP 130/77 | HR 74 | Temp 97.00000°F | Wt 143.0 lb

## 2024-03-18 DIAGNOSIS — R7989 Other specified abnormal findings of blood chemistry: Secondary | ICD-10-CM

## 2024-03-18 NOTE — Progress Notes (Signed)
 "        Referring Provider: Dr. Terra    PCP:  Boyd Craven, APRN - NP    Hematology History:   The patient is a pleasant female kindly referred by her gastroenterologist Dr. Terra regarding an elevated ferritin level.  She was referred to Dr. Terra for hepatic steatosis and an elevated ferritin level.  Ferritin was first noted to be elevated 10/18/2023 when it was 628.  On repeat testing 12/06/2023, persistently elevated at 629.  Abdominal ultrasound showed hepatic steatosis.    Dr. Terra ordered testing for hereditary hemochromatosis, but she elected to delay as she knew that she would have labs drawn here with us  today.  She denies any known family history of iron overload or hereditary hemochromatosis.  She has a brother who she believes required phlebotomy for polycythemia.    Hemochromatosis gene analysis revealed heterozygous c.187C>G    History of Present Illness:  The patient is a pleasant 78 year old female who presents for ongoing management of elevated ferritin.  She has had an MRI of the liver at [facility] for assessment of iron content since her last visit.  She reports feeling generally well.  She remains off of Nutrafol, vitamin C, or any iron containing supplements other than the protein shakes that she drinks regularly which contain a small amount of iron.      Review of Systems;  A 14 point review of systems was obtained with pertinent positives and negatives as outlined above in HPI.      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 Ago    Erosive gastritis    Hepatic steatosis 04/06/2022    Per 03/30/22 CT    High cholesterol     Lumbar radiculopathy 07/26/2020    Lung cancer (HCC) 1993    Malignant neoplasm of skin 06/21/2020    Obesity     Osteoarthritis     Osteopenia     Osteoporosis     Patellofemoral syndrome of right knee 02/07/2021    Restrictive lung disease 03/22/2022    Rotator cuff tear 02/07/2021    Sciatica     Spasmodic dysphonia     left vocal cord paralysis    Spondylolisthesis L5     Patient Active Problem List   Diagnosis    Asthma    Lumbosacral spondylosis without myelopathy    History of renal calculi    Gastroesophageal reflux disease    Osteoarthritis of shoulder region    S/P shoulder replacement    Primary osteoarthritis of right knee    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)    Restrictive lung disease    Osteoarthritis of left hip    Osteoarthritis of right hip    Hepatic steatosis    History  of total shoulder replacement, right    Rotator cuff syndrome, right    Agatston CAC score, <100        Past Surgical History:      Procedure Laterality Date    BACK SURGERY  L5    BREAST BIOPSY  1980    BREAST CYST EXCISION      BREAST REDUCTION SURGERY  Several    CHOLECYSTECTOMY      ELBOW SURGERY Right     EYE SURGERY      Cataracts, laser    FRACTURE SURGERY      Both wrists    HAND SURGERY  R & L    JOINT REPLACEMENT      R shoulder, R ulna    KNEE ARTHROPLASTY      Torn meniscus    KNEE ARTHROSCOPY  R knee    KNEE SURGERY  11/26/2018    Arthroscopy right knee with partial medial meniscectomy and chondroplasty Dr. Arlyss    LOBECTOMY  1980    apical resection    LUMBAR FUSION  2001    Dr. Caralee ILES SPINE SURGERY  2003    hardware removal    SHOULDER ARTHROPLASTY Right 06/2012    Dr Gretel    SHOULDER SURGERY Right 1996    Dr. Arlyss    SHOULDER SURGERY Right 2011    Dr. Arlyss    SKIN CANCER EXCISION       multiple    SPINAL FUSION  L 5    SUBMANDIBULAR GLAND EXCISION      THORACOTOMY  1993    TUBAL LIGATION  1980    UPPER GASTROINTESTINAL ENDOSCOPY  03-2021    Erosive gastritis    US  BREAST FINE NEEDLE ASPIRATION      WRIST FRACTURE SURGERY  Both    WRIST SURGERY Bilateral     ORIF, bilat wrist fxs    WRIST SURGERY  2007    both wrist    WRIST SURGERY  2005    broken wrist       Family History:  Family History   Problem Relation Age of Onset    Colon Cancer Mother     Cancer Mother         Colon    Stroke Father     Abdominal aortic aneurysm Father     Diabetes Father     High Blood Pressure Father     Prostate Cancer Father     Hypertension Father     Cancer Father         Cancer prostate    Heart Surgery Brother     Heart Surgery Brother     Arthritis Brother         Back,legs,shoulder, cancer lung,liver,bone    High Cholesterol Brother     Cancer Brother         Cancer of lung, liver and bone    Esophageal Cancer Brother     Cancer Brother         Tongue    Abdominal aortic aneurysm Brother     Cancer Brother         Adenoid    Cancer Son         tongue cancer    Colon Cancer Maternal Grandmother     Breast Cancer Paternal Grandmother     Breast Cancer Maternal Aunt         Breast surgery    Cancer Maternal Aunt  Breast    Cancer Brother         Esophagus    Cancer Maternal Uncle         Lung    Cancer Maternal Uncle         Prostate    Cancer Maternal Uncle         Lung    Cancer Brother         Esophageal cancer       Medications:  Reviewed and reconciled.  Current Outpatient Medications   Medication Sig Dispense Refill    ALPRAZolam  (XANAX ) 0.25 MG tablet Take 2 tablets by mouth 2 times daily as needed (vocal cord paralysis) for up to 90 days. Max Daily Amount: 1 mg 360 tablet 0    clobetasol  (TEMOVATE ) 0.05 % ointment       metFORMIN (GLUCOPHAGE) 850 MG tablet  (Patient not taking: Reported on 10/18/2023)      tiZANidine (ZANAFLEX) 2 MG tablet       omeprazole  (PRILOSEC) 40 MG delayed release capsule  Take 1 capsule by mouth daily 90 capsule 3    vitamin B-12 (CYANOCOBALAMIN) 100 MCG tablet Vitamin B-12 (Patient not taking: Reported on 10/18/2023)      MAGNESIUM PO Take by mouth      BIOTIN PO Take by mouth      estradiol  (ESTRACE  VAGINAL) 0.1 MG/GM vaginal cream Place 1 g vaginally daily Nightly x 2 weeks, every other night x 2 weeks, then as needed (Patient not taking: Reported on 12/10/2023) 42.5 g 3    albuterol sulfate HFA (PROVENTIL;VENTOLIN;PROAIR) 108 (90 Base) MCG/ACT inhaler       vitamin D3 (CHOLECALCIFEROL) 125 MCG (5000 UT) TABS tablet   5,000 units = tabs, Oral, Daily, # 100 tabs, 0 Refill(s)      calcium carbonate 1500 (600 Ca) MG TABS tablet Take 2 tablets by mouth daily as needed      acetaminophen  (TYLENOL ) 325 MG tablet 1 tablet as needed Orally every 6 hrs       No current facility-administered medications for this visit.         Social History:  Social History     Socioeconomic History    Marital status: Married     Spouse name: Not on file    Number of children: Not on file    Years of education: Not on file    Highest education level: Not on file   Occupational History    Not on file   Tobacco Use    Smoking status: Former     Current packs/day: 0.00     Average packs/day: 1 pack/day for 13.8 years (13.8 ttl pk-yrs)     Types: Cigarettes     Start date: 06/19/1963     Quit date: 03/20/1977     Years since quitting: 47.0    Smokeless tobacco: Never   Vaping Use    Vaping status: Never Used   Substance and Sexual Activity    Alcohol use: Not Currently     Comment: 2/day    Drug use: Never    Sexual activity: Not Currently     Partners: Male   Other Topics Concern    Not on file   Social History Narrative    Not on file     Social Drivers of Health     Financial Resource Strain: Not on file   Food Insecurity: Not on file   Transportation Needs: Not on file   Physical Activity:  Insufficiently Active (04/16/2023)    Exercise Vital Sign     Days of Exercise per Week: 2 days     Minutes of Exercise per  Session: 30 min   Stress: Not on file   Social Connections: Not on file   Intimate Partner Violence: Not on file   Housing Stability: Unknown (03/15/2024)    Housing Stability Vital Sign     Unable to Pay for Housing in the Last Year: Not on file     Number of Times Moved in the Last Year: 0     Homeless in the Last Year: No       Allergies:  Allergies   Allergen Reactions    Levaquin [Levofloxacin] Palpitations and Other (See Comments)     Event:        Dilaudid [Hydromorphone]     Flurbiprofen      Other reaction(s): Unknown    Nsaids      Other reaction(s): stomach upset    Ezetimibe Other (See Comments)       Physical Exam:  BP 130/77 (BP Site: Left Upper Arm, Patient Position: Sitting, BP Cuff Size: Large Adult)   Pulse 74   Temp 97 F (36.1 C) (Temporal)   Wt 64.9 kg (143 lb)   LMP  (LMP Unknown)   SpO2 98%   BMI 25.33 kg/m   Physical Exam  Constitutional:       Appearance: Normal appearance.   HENT:      Head: Normocephalic and atraumatic.      Mouth/Throat:      Mouth: Mucous membranes are moist.   Eyes:      Extraocular Movements: Extraocular movements intact.      Conjunctiva/sclera: Conjunctivae normal.      Pupils: Pupils are equal, round, and reactive to light.   Cardiovascular:      Pulses: Normal pulses.      Heart sounds: Normal heart sounds.   Pulmonary:      Effort: Pulmonary effort is normal.   Abdominal:      General: Abdomen is flat.   Musculoskeletal:         General: Normal range of motion.      Cervical back: Normal range of motion.   Lymphadenopathy:      Comments: No cervical, axillary, or inguinal adenopathy   Skin:     General: Skin is warm and dry.   Neurological:      General: No focal deficit present.      Mental Status: She is alert and oriented to person, place, and time.   Psychiatric:         Mood and Affect: Mood normal.         Behavior: Behavior normal.         ECOG PS 0    Labs:    No visits with results within 2 Day(s) from this visit.   Latest known visit with results  is:   Hospital Outpatient Visit on 02/06/2024   Component Date Value Ref Range Status    Fibrosis Score NASH Plus 02/06/2024 0.36 (H)  0.00 - 0.21 Final    Fibrosis Stage NASH Plus 02/06/2024 F1-F2   Final    Steatosis Score NASH Plus 02/06/2024 0.46 (H)  0.00 - 0.40 Final    Steatosis Grade NASH Plus 02/06/2024 Comment   Final    Comment:                 S1 - Mild Steatosis                       (  But Clinically Significant) (5-33%)      NASH Score Plus 02/06/2024 0.48 (H)  0.00 - 0.25 Final    NASH Grade Plus 02/06/2024 Comment   Final                       N1 -  Mild NASH    Methodology NASH Plus 02/06/2024 Comment   Final    Comment:   The analytes tested are performed by FibroSure-Specific  methods. Not intended for use with other diagnostic  considerations.      Alpha 2-Macroglobulins, Qn 02/06/2024 210  110 - 276 mg/dL Final    Haptoglobin 88/80/7974 73  42 - 346 mg/dL Final    Apolipoprotein A-1 02/06/2024 149  116 - 209 mg/dL Final    BILIRUBIN, TOTAL (NASH), 13917 02/06/2024 0.5  0.0 - 1.2 mg/dL Final    GGT 88/80/7974 20  0 - 60 IU/L Final    ALT (SGPT) P5P 02/06/2024 14  0 - 40 IU/L Final    AST (SGOT) P5P 02/06/2024 22  0 - 40 IU/L Final    Cholesterol, Total  (NASH) 02/06/2024 242 (H)  100 - 199 mg/dL Final    NASH Glucose Serum 02/06/2024 100 (H)  70 - 99 mg/dL Final    Triglycerides 02/06/2024 124  0 - 149 mg/dL Final    Interpretation NASH Plus 02/06/2024 Comment   Final    Comment:   Quantitative results of 10 biochemicals in combination with  age and gender, are analyzed using a computational  algorithm to provide a quantitative surrogate marker (0.0-  1.0) of liver fibrosis (Metavir F0-F4), hepatic steatosis  (0.0-1.0, S0-S3), and Non-Alcoholic Steato-Hepatitis (NASH)  (0.0-1.0, N0-N3), now known as Metabolic Dysfunction-  Associated Steatohepatitis (MASH). The absence of  steatosis (S<0.40) precludes the diagnosis of NASH/MASH.  Fibrosis marker: In a study of 171 Non-Alcoholic Fatty  Liver  Disease (NAFLD), now known as Metabolic Dysfunction-  Associated Steatotic Liver Disease (MASLD), patients where  23% had significant NAFLD/MASLD fibrosis (Metavir F2-F4)  and 11% had cirrhosis by liver biopsy, a fibrosis result of  >0.3 yielded a sensitivity of 83% and a specificity of 78%  for the detection of significant fibrosis.[1] Steatosis  marker: In a population of 2997 patients, where 61% had  significant steatosis (>=5%) on a liver biopsy, a  steatosis score >0.4                            had a sensitivity of 79% and a  specificity of 50% for identification of significant  steatosis.[2] NASH/MASH marker: In a population of 1081  NAFLD/MASLD patients, where 51% had at least some NASH/MASH  by liver biopsy, a prediction of NASH/MASH had a  sensitivity of 72% for identifying NASH/MASH and a  specificity of 71%.[3]      FibroSURE Fibrosis Scoring 02/06/2024 Comment   Final    Comment:        <=0.21 = Stage F0 - No fibrosis  0.21 - 0.27 = Stage F0 - F1  0.27 - 0.31 = Stage F1 - Portal fibrosis  0.31 - 0.48 = Stage F1 - F2  0.48 - 0.58 = Stage F2 - Bridging fibrosis with few septa  0.58 - 0.72 = Stage F3 - Bridging fibrosis with many septa  0.72 - 0.74 = Stage F3 - F4        >0.74 = Stage F4 - Cirrhosis  Steatosis Scoring NASH Plus 02/06/2024 Comment   Final    Comment:        <=0.40 = S0  - No Steatosis (<5%)  0.40 - 0.55 = S1  - Mild Steatosis                      (but Clinically Significant) (5-33%)        >0.55 = S2S3- Moderate to Severe Steatosis                      (Clinically Significant) (34-100%)      NASH Scoring Plus 02/06/2024 Comment   Final    Comment:        <=0.25 = N0 - No NASH/MASH  0.25 - 0.50 = N1 - Mild NASH/MASH  0.50 - 0.75 = N2 - Moderate NASH/MASH        >0.75 = N3 - Severe NASH/MASH      Limitations NASH Plus 02/06/2024 Comment   Final    Comment:   NASH FibroSure(R) Plus is recommended for patients with  suspected non-alcoholic fatty liver disease, now known as  Metabolic  Dysfunction-Associated Steatotic Liver Disease or  MASLD. It is not recommended for patients with other liver  diseases. It is also not recommended in patients with  [facility] Disease, acute hemolysis, acute viral hepatitis,  drug induced hepatitis, genetic liver disease, autoimmune  hepatitis and/or extra-hepatic cholestasis. Any of these  clinical situations may lead to inaccurate quantitative  predictions of fibrosis.      Comment NASH Plus 02/06/2024 Comment   Final    Comment:   This test was developed and its performance characteristics  determined by [facility]. It has not been cleared or approved  by the Food and Drug Administration.    For questions regarding this report please contact customer service  at [phone].    References:    1.  Ratziu V. et al. Diagnostic Value of Biochemical Markers  (FibroTest) for the prediction of Liver Fibrosis in patients with  Non-Alcoholic Fatty Liver Disease. [facility] 2006; 6:6.  2.  Poynard T. et al. The Diagnostic Performance of a Simplified  Blood Test (SteatoTest-2) for the Prediction of Liver Steatosis.  Eur J Gastroenterol Hepatol. 2019; 68:606-597.  3.  Poynard T. et al. Diagnostic performance of a new noninvasive  test for nonalcoholic steatohepatitis using a simplified histological  reference. Eur J Gastroenterol Hepatol. 2018 May; 30:569-577.  Performed At: [facility]  [address] [place], [place] [id number]  Jennette Shorter MD Ey:[id number]      Hepatitis C Ab 02/06/2024 Negative  Negative Final          Imaging:    MRI Abdomen W Wo Contrast  Result Date: 03/07/2024  EXAMINATION: MRI ABDOMEN W/WO IV CONTRAST 03/07/2024 12:23 PM ACCESSION NUMBER: [id number] INDICATION: ELEVATED FERRITIN STAT CREATINIE YES SPECIFIY ORGAN LIVER. ELEVATED FERRITIN. COMPARISON: None TECHNIQUE: Multiplanar, multisequence MRI was performed of the abdomen with and without intravenous contrast administration. FINDINGS: Lung bases: Unremarkable. Liver: Hepatic  parenchyma is unremarkable. No significant iron deposition. Based on the formula of Toysrus. al (Blood 2005, 106: 1460-1465) values are as follows: T2*= 20.4 ms R2*= 49.1 Hz Hepatic iron content= 1.45 mg/g dry liver tissue weight Gallbladder/Biliary: Gallbladder is absent. No biliary ductal dilatation. Spleen: Unremarkable. Pancreas: Unremarkable. Adrenal glands: Unremarkable. Kidneys: No hydronephrosis. Bilateral cysts. GI tract: No evidence of bowel obstruction. Lymph nodes: No lymphadenopathy. Vasculature: No evidence of AAA.  Portal, hepatic,  and splenic veins are patent. Free fluid: None. Body wall: Unremarkable. Bones: No acute bony abnormality.  No suspicious bony lesion. Vertebral body hemangiomas.    IMPRESSION: Iron content of the liver measures approximately 1.45 mg/g of liver tissue. Dictated by: Thedora Metro, MD. 03/07/2024 3:03 PM I, Tinnie Sloop, MD, have reviewed the study and agree with the findings in this report.  03/07/2024 3:27 PM       ASSESSMENT:    Ms. Selk is a very pleasant 78 year old female with an elevated ferritin level.  Genotype testing for hereditary hemochromatosis heterozygous for 187C>G  gene mutation.  While carriers for hemochromatosis do not usually go on to develop iron overload, given her ferritin greater than 500, we recommended MRI liver at [facility] to assess for iron overload.  She had an MRI of the liver on 03/07/2024 with no evidence of significant iron deposition.  No concerning findings in the liver.  Hepatic iron content 1.45 mg/g dry liver tissue weight.  History of cyst adenocarcinoma of the lung status post left lower lobe lobectomy  Extensive family history of cancer.  Genetic testing with [facility] negative.    PLAN:  We reviewed results from her recent MRI liver.  Her ferritin had improved when checked in late October and we will plan on rechecking ferritin today.  Also review the results of this ferritin and recent MRI with Dr. Terrea.  The patient and I  discussed possibility of initiating phlebotomy on a regular basis.  I will discuss further with Dr. Terrea.  The patient is very interested in resuming Neutra fall as this was helping her hair growth.  I will discuss this further with Dr. Jackee.  Neutra fall does not contain iron but does contain 60 mg of vitamin C per tablet and patient was taking 4/day.  She remains on omeprazole  which we are fine with this as this can decrease iron absorption.   I will contact the patient once I have spoken with Dr. Terrea.  For now, we will plan on a 1-month follow-up visit with labs including ferritin.  All of the patient's questions have been answered and she is in agreement with plan as outlined above.      I have pre-screened the patient's medical records for currently enrolling clinical trials at [facility]. At this time, the patient is []  potentially eligible/ [x]  not eligible. If deemed eligible, I have discussed the clinical trial with the patient during today's clinical visit. The Research Coordinator will be notified to determine the appropriate next steps.     Approximately spent 20 minutes on chart review as well as time spent on patient encounter, discussing the laboratory, imaging, and clinical findings. I have discussed clinical implications and recommendations on the patient's primary issues. More than 50% of time was spent counseling patient. The patient verbalized understanding.      Thank you for allowing us  to participate in the care of Cheryle Dark, APRN - CNP   03/18/24 1:54 PM                                     "

## 2024-03-19 LAB — FERRITIN: Ferritin: 461 ng/mL — ABNORMAL HIGH (ref 13.0–150.0)

## 2024-03-21 NOTE — Telephone Encounter (Signed)
"  Ferritin 461.  I reviewed the recent MRI results with Dr. Terrea and per her recommendation we will hold off on phlebotomy at this time since it was not evidence of iron overload and ferritin is declining.  Dr. Terrea is also okay if patient begins Neufol again and we will continue to monitor her ferritin closely.  Patient and I reviewed recommendations on the phone today.  "

## 2024-04-09 NOTE — Progress Notes (Unsigned)
 "  NEUROLOGY FOLLOW UP OFFICE NOTE  Maria Schneider [id number]  Assessment/Plan:   Memory deficits, unclear etiology - Repeated MoCA testing has demonstrated not just stability but improvement.  However, given that she endorses memory difficulty on a daily basis, further monitoring is warranted. Balance disorder - unclear if residual from prior hip surgeries.  History of lumbar spondylosis, so lumbar stenosis is possible but does not exhibit any significant bilateral leg pain Tremor - possibly essential tremor    Continue routine walking and exercise to help with balance Monitor:  memory, tremor, balance.  Plan to follow up in 6 months or sooner if needed for re-evaluation. If she thinks memory is getting worse, consider neuropsychological evaluation  Total time spent in chart and face to face with patient:  ***     Subjective:  Maria Schneider is a 79 year old right-handed female with CAD, HTN, HLD who follows up for memory deficits and balance disorder.  History supplemented by her accompanying husband an referring provider's note.  UPDATE: Balance disorder: She feels balance is fairly unchanged.  She is more cautious and aware to prevent falls.  If she turns around quickly, she becomes off balance.  She has low back pain and know degenerative lumbar spine disease.  Memory problems: Memory problems have also remained stable.  Memory difficulty is a daily problem but able to perform all daily activities, does not get disoriented in familiar places and manages her medications.      HISTORY: Balance disorder: She began experiencing balance problems in 2023.  She describes it mostly as a problem with depth perception.  She has cataracts.  She may walk into things.  She also feels like she is dragging her left leg.  At first she thought it was related to having had bilateral hip replacement but it has gotten worse.  If she turns too quickly or bends over, she may have vertigo for a few  seconds.  Sometimes she notices brief vertical diplopia.  She finds that she chokes more frequently.   Denies neck and back pain.  Denies numbness in the feet.    Memory deficits: She also reports increased problems with short term memory over the past couple of months.  She will quickly forget conversations after 10 minutes.  She may need to think a moment to remember things that day, such as what she ate for breakfast.  If she is talking and is interrupted, she easily loses her train of thought and has difficulty redirecting to her conversation.  Sometimes she has difficulty pronouncing words.   Hand tremor: Sometimes if she is holding something in her left hand, it will shake.  Usually that occurs after she has been leaning on her elbow for a while.   Does not occur in her right hand.  Tremors do not run in the family.  Testing: TSH was 3.06.  She was found to have a B12 level of 91 in June 2023, but symptoms persisted despite injections with repeat level in October 2023 >1500.  Around that time, her sister had recently passed away unexpectedly, which may be contributing to increased emotional stress that could be affecting symptoms such as memory.  Neuropathy labs in 2024 included negative ANA and ENA panel, negative IFE, ACE 34.  MRI of brain without contrast on 04/27/2022 revealed generalized volume loss since imaging from 2017 and very mild chronic small vessel ischemic changes within the cerebral white matter but otherwise unremarkable.  PAST MEDICAL HISTORY: Past Medical History:  Diagnosis Date   Allergy    Arthritis    Asthma    Cancer (HCC)    hx skin cancer   Chronic kidney disease    CKD stage 3b, GFR 30-44 ml/min (HCC)    sees Dr. Marlee   Depression    Detrusor instability    Diverticulosis    not symptomatic   GERD (gastroesophageal reflux disease)    History of kidney stones    History of pancreatitis    April 2015   History of skin cancer    Hyperlipidemia     Hypertension    Osteopenia    Urinary incontinence    Vitamin D  deficiency    RESOLVED     MEDICATIONS: Current Outpatient Medications on File Prior to Visit  Medication Sig Dispense Refill   aspirin  EC 81 MG tablet Take 1 tablet (81 mg total) by mouth daily. Swallow whole. 90 tablet 3   buPROPion  (WELLBUTRIN  XL) 300 MG 24 hr tablet TAKE 1 TABLET BY MOUTH EVERY DAY 90 tablet 3   Cholecalciferol (VITAMIN D3) 25 MCG (1000 UT) CAPS Take 2,000 Units by mouth daily.     esomeprazole  (NEXIUM ) 20 MG capsule Take 1 capsule (20 mg total) by mouth 2 (two) times daily before a meal. 2 capsule 0   Evolocumab  (REPATHA  SURECLICK) 140 MG/ML SOAJ Inject 140 mg into the skin every 14 (fourteen) days. 6 mL 0   ezetimibe  (ZETIA ) 10 MG tablet TAKE 1 TABLET BY MOUTH EVERY DAY 90 tablet 0   fenofibrate  160 MG tablet TAKE 1 TABLET DAILY 90 tablet 3   metoprolol  succinate (TOPROL -XL) 25 MG 24 hr tablet TAKE 1 TABLET (25 MG TOTAL) BY MOUTH DAILY. 90 tablet 1   MYRBETRIQ  50 MG TB24 tablet Take 50 mg by mouth daily.  11   sertraline  (ZOLOFT ) 100 MG tablet TAKE 2 TABLETS BY MOUTH EVERY DAY 180 tablet 3   valsartan  (DIOVAN ) 80 MG tablet TAKE 1 TABLET BY MOUTH EVERY DAY 90 tablet 3   No current facility-administered medications on file prior to visit.    ALLERGIES: Allergies  Allergen Reactions   Ace Inhibitors     REACTION: cough   Codeine Nausea And Vomiting   Fentanyl  Itching   Statins     REACTION: MYALGIAS-muscle pain    FAMILY HISTORY: Family History  Problem Relation Age of Onset   Hypertension Mother    Osteoporosis Mother    Heart disease Father    Osteoporosis Sister    Breast cancer Paternal Aunt        Age 79's   Diabetes Paternal Uncle       Objective:  *** General: No acute distress.  Patient appears well-groomed.   Head:  Normocephalic/atraumatic Eyes:  Fundi examined but not visualized Neck: supple, no paraspinal tenderness, full range of motion Heart:  Regular rate and  rhythm Neurological Exam:     09/05/2023   10:00 AM 09/05/2022   10:00 AM 03/06/2022    1:00 PM  Montreal Cognitive Assessment   Visuospatial/ Executive (0/5) 5 3 4   Naming (0/3) 3 3 3   Attention: Read list of digits (0/2) 2 2 2   Attention: Read list of letters (0/1) 1 1 1   Attention: Serial 7 subtraction starting at 100 (0/3) 3 3 3   Language: Repeat phrase (0/2) 2 2 2   Language : Fluency (0/1) 1 1 1   Abstraction (0/2) 2 2 2   Delayed Recall (0/5) 4 2 1  Orientation (0/6) 6 6 6   Total 29 25 25   Adjusted Score (based on education)   25   alert and oriented.  Speech fluent and not dysarthric, language intact.  CN II-XII intact. Bulk and tone normal, muscle strength 5/5 throughout.  Sensation to pinprick and vibration iintact.  Deep tendon reflexes 2+ throughout, toes downgoing.  Finger to nose testing intact.  Gait steady.  Not cautious.  Able to turn.  Struggles with tandem walk, Romberg negative. ***   Juliene Dunnings, DO  CC: Garnette Olmsted, MD       "

## 2024-04-10 ENCOUNTER — Other Ambulatory Visit: Payer: Self-pay | Admitting: Family Medicine

## 2024-04-10 ENCOUNTER — Ambulatory Visit: Admitting: Neurology

## 2024-04-10 ENCOUNTER — Encounter: Payer: Self-pay | Admitting: Neurology

## 2024-04-10 VITALS — BP 119/78 | HR 77 | Ht 64.0 in | Wt 160.8 lb

## 2024-04-10 DIAGNOSIS — R2689 Other abnormalities of gait and mobility: Secondary | ICD-10-CM | POA: Diagnosis not present

## 2024-04-10 DIAGNOSIS — R413 Other amnesia: Secondary | ICD-10-CM | POA: Diagnosis not present

## 2024-04-10 NOTE — Patient Instructions (Addendum)
 Nerve conduction study of lower extremities to evaluate for neuropathy. ELECTROMYOGRAM AND NERVE CONDUCTION STUDIES (EMG/NCS) INSTRUCTIONS  How to Prepare The neurologist conducting the EMG will need to know if you have certain medical conditions. Tell the neurologist and other EMG lab personnel if you: Have a pacemaker or any other electrical medical device Take blood-thinning medications Have hemophilia, a blood-clotting disorder that causes prolonged bleeding Bathing Take a shower or bath shortly before your exam in order to remove oils from your skin. Dont apply lotions or creams before the exam.  What to Expect Youll likely be asked to change into a hospital gown for the procedure and lie down on an examination table. The following explanations can help you understand what will happen during the exam.  Electrodes. The neurologist or a technician places surface electrodes at various locations on your skin depending on where youre experiencing symptoms. Or the neurologist may insert needle electrodes at different sites depending on your symptoms.  Sensations. The electrodes will at times transmit a tiny electrical current that you may feel as a twinge or spasm. The needle electrode may cause discomfort or pain that usually ends shortly after the needle is removed. If you are concerned about discomfort or pain, you may want to talk to the neurologist about taking a short break during the exam.  Instructions. During the needle EMG, the neurologist will assess whether there is any spontaneous electrical activity when the muscle is at rest - activity that isnt present in healthy muscle tissue - and the degree of activity when you slightly contract the muscle.  He or she will give you instructions on resting and contracting a muscle at appropriate times. Depending on what muscles and nerves the neurologist is examining, he or she may ask you to change positions during the exam.  After your EMG You  may experience some temporary, minor bruising where the needle electrode was inserted into your muscle. This bruising should fade within several days. If it persists, contact your primary care doctor.   Neurocognitive evaluation  Follow up after testing.

## 2024-04-16 LAB — OPHTHALMOLOGY REPORT-SCANNED

## 2024-04-25 ENCOUNTER — Encounter: Payer: MEDICARE | Attending: Family Medicine | Primary: Family Medicine

## 2024-05-01 ENCOUNTER — Ambulatory Visit: Payer: MEDICARE | Primary: Family Medicine

## 2024-05-12 ENCOUNTER — Encounter: Payer: Self-pay | Admitting: Neurology

## 2024-05-26 ENCOUNTER — Ambulatory Visit: Payer: Self-pay

## 2024-05-26 ENCOUNTER — Institutional Professional Consult (permissible substitution): Payer: Self-pay | Admitting: Psychology

## 2024-06-02 ENCOUNTER — Encounter: Payer: Self-pay | Admitting: Psychology

## 2024-10-20 ENCOUNTER — Ambulatory Visit: Payer: Self-pay | Admitting: Neurology
